# Patient Record
Sex: Male | Born: 1950 | Race: White | Hispanic: No | Marital: Married | Smoking: Former smoker
Health system: Southern US, Community
[De-identification: ages and names within clinical notes are randomized; demographics above are authoritative.]

## PROBLEM LIST (undated history)

## (undated) DIAGNOSIS — I10 Essential (primary) hypertension: Secondary | ICD-10-CM

## (undated) HISTORY — PX: APPENDECTOMY: SHX54

---

## 2010-05-31 DIAGNOSIS — M1990 Unspecified osteoarthritis, unspecified site: Secondary | ICD-10-CM | POA: Insufficient documentation

## 2013-04-03 ENCOUNTER — Other Ambulatory Visit: Payer: Self-pay

## 2013-04-03 ENCOUNTER — Encounter (HOSPITAL_COMMUNITY): Payer: Self-pay | Admitting: Emergency Medicine

## 2013-04-03 ENCOUNTER — Emergency Department (HOSPITAL_COMMUNITY)
Admission: EM | Admit: 2013-04-03 | Discharge: 2013-04-03 | Disposition: A | Discharge status: Home / Self Care | Payer: Non-veteran care | Attending: Emergency Medicine | Admitting: Emergency Medicine

## 2013-04-03 DIAGNOSIS — R319 Hematuria, unspecified: Secondary | ICD-10-CM

## 2013-04-03 DIAGNOSIS — R1013 Epigastric pain: Secondary | ICD-10-CM

## 2013-04-03 DIAGNOSIS — R110 Nausea: Secondary | ICD-10-CM | POA: Insufficient documentation

## 2013-04-03 DIAGNOSIS — I10 Essential (primary) hypertension: Secondary | ICD-10-CM | POA: Insufficient documentation

## 2013-04-03 HISTORY — DX: Essential (primary) hypertension: I10

## 2013-04-03 LAB — URINALYSIS, ROUTINE W REFLEX MICROSCOPIC
Bilirubin Urine: NEGATIVE
Glucose, UA: NEGATIVE mg/dL
Ketones, ur: 40 mg/dL — AB
Leukocytes, UA: NEGATIVE
Protein, ur: NEGATIVE mg/dL
Urobilinogen, UA: 0.2 mg/dL (ref 0.0–1.0)
pH: 6 (ref 5.0–8.0)

## 2013-04-03 LAB — CBC WITH DIFFERENTIAL/PLATELET
Basophils Absolute: 0 10*3/uL (ref 0.0–0.1)
Basophils Relative: 0 % (ref 0–1)
HCT: 46.3 % (ref 39.0–52.0)
Hemoglobin: 16.5 g/dL (ref 13.0–17.0)
Lymphocytes Relative: 26 % (ref 12–46)
Lymphs Abs: 1.9 10*3/uL (ref 0.7–4.0)
MCHC: 35.6 g/dL (ref 30.0–36.0)
Monocytes Absolute: 0.5 10*3/uL (ref 0.1–1.0)
Monocytes Relative: 7 % (ref 3–12)
Neutro Abs: 4.8 10*3/uL (ref 1.7–7.7)
Neutrophils Relative %: 66 % (ref 43–77)
RDW: 12.5 % (ref 11.5–15.5)
WBC: 7.3 10*3/uL (ref 4.0–10.5)

## 2013-04-03 LAB — URINE MICROSCOPIC-ADD ON

## 2013-04-03 LAB — COMPREHENSIVE METABOLIC PANEL
AST: 24 U/L (ref 0–37)
Albumin: 4.5 g/dL (ref 3.5–5.2)
Alkaline Phosphatase: 85 U/L (ref 39–117)
CO2: 25 mEq/L (ref 19–32)
Chloride: 101 mEq/L (ref 96–112)
Creatinine, Ser: 0.82 mg/dL (ref 0.50–1.35)
GFR calc Af Amer: >90 mL/min (ref >90)
GFR calc non Af Amer: >90 mL/min (ref >90)
Glucose, Bld: 112 mg/dL — ABNORMAL HIGH (ref 70–99)
Potassium: 3.6 mEq/L (ref 3.5–5.1)
Total Bilirubin: 0.8 mg/dL (ref 0.3–1.2)

## 2013-04-03 LAB — LIPASE, BLOOD: Lipase: 22 U/L (ref 11–59)

## 2013-04-03 MED ORDER — FAMOTIDINE 20 MG PO TABS: 20.0000 mg | ORAL_TABLET | Freq: Two times a day (BID) | ORAL | Status: DC

## 2013-04-03 MED ORDER — ALPRAZOLAM 0.25 MG PO TABS: 0.2500 mg | ORAL_TABLET | Freq: Every evening | ORAL | Status: DC | PRN

## 2013-04-03 NOTE — ED Provider Notes (Signed)
CSN: [id number]     Arrival date & time 04/03/13  7829 History   First MD Initiated Contact with Patient 04/03/13 1046     Chief Complaint  Patient presents with  . Abdominal Pain   (Consider location/radiation/quality/duration/timing/severity/associated sxs/prior Treatment) Patient is a 62 y.o. male presenting with abdominal pain. The history is provided by the patient.  Abdominal Pain Pain location:  Epigastric Pain quality: aching and dull   Pain radiates to:  Does not radiate Pain severity:  Mild Onset quality:  Gradual Duration:  3 weeks Timing:  Intermittent Progression:  Unchanged Chronicity:  New Relieved by:  Nothing Worsened by:  Nothing tried Ineffective treatments:  None tried Associated symptoms: nausea (off and on, none today)   Associated symptoms: no anorexia, no belching, no chills, no cough, no dysuria, no fever, no shortness of breath and no vomiting    Scott [name] is a 62 y.o. male who presents to the ED with epigastric pain that has been off and on x 3 weeks The pain is not associated with food. He thinks he has gallbladder problems. States he has been to the [place] for his medical care and treated for back problem but has not been for this problem.  He has had a few loose stools but none today. He describes his pain as a dull ache about 3/10.   Past Medical History  Diagnosis Date  . Hypertension    History reviewed. No pertinent past surgical history. No family history on file. History  Substance Use Topics  . Smoking status: Never Smoker   . Smokeless tobacco: Not on file  . Alcohol Use: No    Review of Systems  Constitutional: Negative for fever and chills.  Eyes: Negative for visual disturbance.  Respiratory: Negative for cough and shortness of breath.   Gastrointestinal: Positive for nausea (off and on, none today) and abdominal pain. Negative for vomiting and anorexia.  Genitourinary: Negative for dysuria, urgency, frequency, decreased urine volume  and testicular pain.  Skin: Negative for rash.  Neurological: Negative for weakness and light-headedness. Headaches: no headache today.  Psychiatric/Behavioral: Nervous/anxious: feeling of anxiety.    Results for orders placed during the hospital encounter of 04/03/13 (from the past 24 hour(s))  CBC WITH DIFFERENTIAL     Status: Abnormal   Collection Time    04/03/13 11:08 AM      Result Value Range   WBC 7.3  4.0 - 10.5 K/uL   RBC 4.78  4.22 - 5.81 MIL/uL   Hemoglobin 16.5  13.0 - 17.0 g/dL   HCT 56.2  13.0 - 86.5 %   MCV 96.9  78.0 - 100.0 fL   MCH 34.5 (*) 26.0 - 34.0 pg   MCHC 35.6  30.0 - 36.0 g/dL   RDW 78.4  69.6 - 29.5 %   Platelets 202  150 - 400 K/uL   Neutrophils Relative % 66  43 - 77 %   Neutro Abs 4.8  1.7 - 7.7 K/uL   Lymphocytes Relative 26  12 - 46 %   Lymphs Abs 1.9  0.7 - 4.0 K/uL   Monocytes Relative 7  3 - 12 %   Monocytes Absolute 0.5  0.1 - 1.0 K/uL   Eosinophils Relative 1  0 - 5 %   Eosinophils Absolute 0.1  0.0 - 0.7 K/uL   Basophils Relative 0  0 - 1 %   Basophils Absolute 0.0  0.0 - 0.1 K/uL  COMPREHENSIVE METABOLIC PANEL  Status: Abnormal   Collection Time    04/03/13 11:08 AM      Result Value Range   Sodium 139  135 - 145 mEq/L   Potassium 3.6  3.5 - 5.1 mEq/L   Chloride 101  96 - 112 mEq/L   CO2 25  19 - 32 mEq/L   Glucose, Bld 112 (*) 70 - 99 mg/dL   BUN 15  6 - 23 mg/dL   Creatinine, Ser 1.61  0.50 - 1.35 mg/dL   Calcium 09.6  8.4 - 04.5 mg/dL   Total Protein 7.6  6.0 - 8.3 g/dL   Albumin 4.5  3.5 - 5.2 g/dL   AST 24  0 - 37 U/L   ALT 17  0 - 53 U/L   Alkaline Phosphatase 85  39 - 117 U/L   Total Bilirubin 0.8  0.3 - 1.2 mg/dL   GFR calc non Af Amer >90  >90 mL/min   GFR calc Af Amer >90  >90 mL/min  LIPASE, BLOOD     Status: None   Collection Time    04/03/13 11:08 AM      Result Value Range   Lipase 22  11 - 59 U/L  URINALYSIS, ROUTINE W REFLEX MICROSCOPIC     Status: Abnormal   Collection Time    04/03/13 11:25 AM       Result Value Range   Color, Urine YELLOW  YELLOW   APPearance CLEAR  CLEAR   Specific Gravity, Urine 1.015  1.005 - 1.030   pH 6.0  5.0 - 8.0   Glucose, UA NEGATIVE  NEGATIVE mg/dL   Hgb urine dipstick LARGE (*) NEGATIVE   Bilirubin Urine NEGATIVE  NEGATIVE   Ketones, ur 40 (*) NEGATIVE mg/dL   Protein, ur NEGATIVE  NEGATIVE mg/dL   Urobilinogen, UA 0.2  0.0 - 1.0 mg/dL   Nitrite NEGATIVE  NEGATIVE   Leukocytes, UA NEGATIVE  NEGATIVE  URINE MICROSCOPIC-ADD ON     Status: Abnormal   Collection Time    04/03/13 11:25 AM      Result Value Range   RBC / HPF TOO NUMEROUS TO COUNT  <3 RBC/hpf   Bacteria, UA MANY (*) RARE    Allergies  Review of patient's allergies indicates no known allergies.  Home Medications  No current outpatient prescriptions on file. BP 195/108  Pulse 73  Temp(Src) 98.2 F (36.8 C) (Oral)  Resp 18  Ht 5\' 8"  (1.727 m)  Wt 165 lb (74.844 kg)  BMI 25.09 kg/m2  SpO2 98% Physical Exam  Nursing note and vitals reviewed. Constitutional: He is oriented to person, place, and time. He appears well-developed and well-nourished. No distress.  Elevated BP on arrival to the ED.  HENT:  Head: Normocephalic and atraumatic.  Eyes: EOM are normal.  Neck: Neck supple.  Cardiovascular: Normal rate and regular rhythm.   Pulmonary/Chest: Effort normal and breath sounds normal.  Abdominal: Soft. Bowel sounds are normal. There is no tenderness. There is no CVA tenderness.  Minimal tenderness with deep palpation of the epigastric area.   Musculoskeletal: Normal range of motion.  Neurological: He is alert and oriented to person, place, and time. No cranial nerve deficit.  Skin: Skin is warm and dry.  Psychiatric: He has a normal mood and affect. His behavior is normal.    EKG reviewed by Dr. Estell Harpin and patient may be discharged home.  ED Course  Procedures  Discussed clinical and lab findings with the patient suggested CT of  abdomen/pelvis to further evaluate the  hematuria. Patient states that he is not pre approved by the [place] and it will cost to much. Does not think these symptoms are related. Patient now states that he thinks his epigastric pain is due to being upset over his girlfriend breaking up with him and request something to help him sleep. He thinks he has anxiety that is causing his symptoms. Will treat with Pepcid and give xanax to help with sleep. He is to follow up with his PCP for his hematuria and blood pressure.  MDM  62 y.o. male with known hypertensions and elevated BP, epigastric pain, hematuria. Encouraged patient to follow up with his physician at the [facility] for further evaluation. He states he has had hematuria there in the past and they did not seem concerned about it. He will take the medication we give him for his anxiety and epigastric pain and he will take his BP medication and schedule a follow up with this PCP to discuss his epigastric pain and hematuria. He will return here as needed for problems.      Janne Napoleon, [place] 04/03/13 [phone]

## 2013-04-03 NOTE — ED Notes (Signed)
Pt reports upper abd pain, loose stools, headache,chills and nausea for the past few weeks.  Pt reports hx of "gallbladder problems".  Pt reports being treated by [facility] for it w/out relief.

## 2013-04-03 NOTE — ED Provider Notes (Signed)
Medical screening examination/treatment/procedure(s) were performed by non-physician practitioner and as supervising physician I was immediately available for consultation/collaboration.   Benny Lennert, MD 04/03/13 [phone]

## 2013-06-18 ENCOUNTER — Other Ambulatory Visit (HOSPITAL_COMMUNITY): Payer: Self-pay | Admitting: Family Medicine

## 2013-06-18 DIAGNOSIS — R109 Unspecified abdominal pain: Secondary | ICD-10-CM

## 2013-06-19 ENCOUNTER — Ambulatory Visit (HOSPITAL_COMMUNITY): Payer: BC Managed Care – PPO

## 2013-09-13 ENCOUNTER — Other Ambulatory Visit (HOSPITAL_COMMUNITY): Payer: Self-pay | Admitting: Physician Assistant

## 2013-09-13 DIAGNOSIS — R109 Unspecified abdominal pain: Secondary | ICD-10-CM

## 2013-09-13 DIAGNOSIS — K2960 Other gastritis without bleeding: Secondary | ICD-10-CM

## 2013-09-18 ENCOUNTER — Ambulatory Visit (HOSPITAL_COMMUNITY): Payer: BC Managed Care – PPO

## 2014-11-16 ENCOUNTER — Encounter (HOSPITAL_COMMUNITY): Payer: Self-pay | Admitting: *Deleted

## 2014-11-16 ENCOUNTER — Emergency Department (HOSPITAL_COMMUNITY)
Admission: EM | Admit: 2014-11-16 | Discharge: 2014-11-16 | Disposition: A | Discharge status: Home / Self Care | Payer: Medicare Other | Attending: Emergency Medicine | Admitting: Emergency Medicine

## 2014-11-16 DIAGNOSIS — S61219A Laceration without foreign body of unspecified finger without damage to nail, initial encounter: Secondary | ICD-10-CM

## 2014-11-16 DIAGNOSIS — Y9389 Activity, other specified: Secondary | ICD-10-CM | POA: Insufficient documentation

## 2014-11-16 DIAGNOSIS — Z23 Encounter for immunization: Secondary | ICD-10-CM | POA: Insufficient documentation

## 2014-11-16 DIAGNOSIS — I10 Essential (primary) hypertension: Secondary | ICD-10-CM | POA: Insufficient documentation

## 2014-11-16 DIAGNOSIS — Y998 Other external cause status: Secondary | ICD-10-CM | POA: Diagnosis not present

## 2014-11-16 DIAGNOSIS — Y9289 Other specified places as the place of occurrence of the external cause: Secondary | ICD-10-CM | POA: Insufficient documentation

## 2014-11-16 DIAGNOSIS — S61217A Laceration without foreign body of left little finger without damage to nail, initial encounter: Secondary | ICD-10-CM | POA: Insufficient documentation

## 2014-11-16 DIAGNOSIS — Y289XXA Contact with unspecified sharp object, undetermined intent, initial encounter: Secondary | ICD-10-CM | POA: Insufficient documentation

## 2014-11-16 DIAGNOSIS — Z79899 Other long term (current) drug therapy: Secondary | ICD-10-CM | POA: Insufficient documentation

## 2014-11-16 MED ORDER — CEPHALEXIN 500 MG PO CAPS: 500.0000 mg | ORAL_CAPSULE | Freq: Four times a day (QID) | ORAL | Status: DC

## 2014-11-16 MED ORDER — BACITRACIN ZINC 500 UNIT/GM EX OINT
TOPICAL_OINTMENT | CUTANEOUS | Status: AC
  Filled 2014-11-16: qty 0.9

## 2014-11-16 MED ORDER — POVIDONE-IODINE 10 % EX SOLN
CUTANEOUS | Status: AC
  Administered 2014-11-16: 17:00:00
  Filled 2014-11-16: qty 118

## 2014-11-16 MED ORDER — TETANUS-DIPHTH-ACELL PERTUSSIS 5-2.5-18.5 LF-MCG/0.5 IM SUSP
0.5000 mL | Freq: Once | INTRAMUSCULAR | Status: AC
  Administered 2014-11-16: 0.5 mL via INTRAMUSCULAR
  Filled 2014-11-16: qty 0.5

## 2014-11-16 MED ORDER — BACITRACIN-NEOMYCIN-POLYMYXIN 400-5-5000 EX OINT
TOPICAL_OINTMENT | Freq: Once | CUTANEOUS | Status: AC
  Administered 2014-11-16: 17:00:00 via TOPICAL
  Filled 2014-11-16: qty 1

## 2014-11-16 NOTE — Discharge Instructions (Signed)
Change your dressing daily, wash the wound with warm water and antibacterial soap. Take the antibiotic to prevent infection. Return for worsening symptoms such as fever, increased pain, red streaking, drainage from the wound.

## 2014-11-16 NOTE — ED Provider Notes (Signed)
CSN: [id number]     Arrival date & time 11/16/14  1357 History   First MD Initiated Contact with Patient 11/16/14 1607     Chief Complaint  Patient presents with  . Laceration     (Consider location/radiation/quality/duration/timing/severity/associated sxs/prior Treatment) Patient is a 64 y.o. male presenting with skin laceration. The history is provided by the patient.  Laceration Location:  Finger Finger laceration location:  L little finger Depth:  Through underlying tissue Quality: straight   Time since incident: just prior to arrival to the ED. Laceration mechanism:  Unable to specify  Scott [name] is a 64 y.o. male who presents to the ED with left little finger laceration. He states he was working and put his hand in a pile of plywood and felt pain. He pulled his hand out and there was bleeding to the little finger. He denies any other injuries . He is not up to date on tetanus.   Past Medical History  Diagnosis Date  . Hypertension    History reviewed. No pertinent past surgical history. History reviewed. No pertinent family history. History  Substance Use Topics  . Smoking status: Never Smoker   . Smokeless tobacco: Not on file  . Alcohol Use: No    Review of Systems Negative except as stated in HPI   Allergies  Review of patient's allergies indicates no known allergies.  Home Medications   Prior to Admission medications   Medication Sig Start Date End Date Taking? Authorizing Provider  ALPRAZolam (XANAX) 0.25 MG tablet Take 1 tablet (0.25 mg total) by mouth at bedtime as needed for sleep. 04/03/13   Scott Bunnie Pion, NP  cephALEXin (KEFLEX) 500 MG capsule Take 1 capsule (500 mg total) by mouth 4 (four) times daily. 11/16/14   Scott Bunnie Pion, NP  famotidine (PEPCID) 20 MG tablet Take 1 tablet (20 mg total) by mouth 2 (two) times daily. 04/03/13   Scott Bunnie Pion, NP  PRESCRIPTION MEDICATION Take 0.5 tablets by mouth every 4 (four) hours as needed (pain). Prescription pain  pill. **patient unsure of name**    Historical Provider, MD   BP 168/109 mmHg  Pulse 81  Temp(Src) 98 F (36.7 C) (Oral) Physical Exam  Constitutional: He is oriented to person, place, and time. He appears well-developed and well-nourished. No distress.  HENT:  Head: Normocephalic.  Eyes: EOM are normal.  Neck: Neck supple.  Cardiovascular: Normal rate and regular rhythm.   Pulmonary/Chest: Effort normal.  Abdominal: Soft. There is no tenderness.  Musculoskeletal:       Left hand: He exhibits tenderness and laceration. He exhibits normal range of motion. Normal sensation noted. Normal strength noted.       Hands: There is an avulsion laceration to the tip of the left little finger. Tender on exam, no focal neuro deficits of the left hand noted.   Neurological: He is alert and oriented to person, place, and time. No cranial nerve deficit.  Skin: Skin is warm and dry.  Psychiatric: He has a normal mood and affect. His behavior is normal.  Nursing note and vitals reviewed.   ED Course  Procedures  Soaked the finger in NSS and Betadine, bacitracin ointment and dressing applied, finger splint.   MDM  64 y.o. male with avulsion laceration of the left little finger. Stable for d/c without focal neuro deficits of the left hand. Will start Keflex since this is a dirty wound. Patient to return for any signs of infection or problems. Discussed with the  patient plan of care and all questioned fully answered.    Final diagnoses:  Laceration of finger, left, initial encounter      [facility], NP 11/16/14 1711  Virgel Manifold, MD 11/19/14 [phone]

## 2014-11-16 NOTE — ED Notes (Signed)
Cut L 5th finger w/unknown object while working around home.

## 2016-12-11 DIAGNOSIS — R69 Illness, unspecified: Secondary | ICD-10-CM | POA: Diagnosis not present

## 2016-12-11 DIAGNOSIS — Z6827 Body mass index (BMI) 27.0-27.9, adult: Secondary | ICD-10-CM | POA: Diagnosis not present

## 2016-12-11 DIAGNOSIS — Z0000 Encounter for general adult medical examination without abnormal findings: Secondary | ICD-10-CM | POA: Diagnosis not present

## 2016-12-11 DIAGNOSIS — M25561 Pain in right knee: Secondary | ICD-10-CM | POA: Diagnosis not present

## 2016-12-11 DIAGNOSIS — R030 Elevated blood-pressure reading, without diagnosis of hypertension: Secondary | ICD-10-CM | POA: Diagnosis not present

## 2019-06-21 ENCOUNTER — Other Ambulatory Visit: Payer: Self-pay

## 2019-06-21 ENCOUNTER — Ambulatory Visit: Payer: Medicare Other | Attending: Internal Medicine

## 2019-06-21 DIAGNOSIS — Z20822 Contact with and (suspected) exposure to covid-19: Secondary | ICD-10-CM

## 2019-06-23 LAB — NOVEL CORONAVIRUS, NAA: SARS-CoV-2, NAA: DETECTED — AB

## 2019-06-25 ENCOUNTER — Telehealth: Payer: Self-pay | Admitting: Nurse Practitioner

## 2019-06-25 NOTE — Telephone Encounter (Signed)
Called to Discuss with patient about Covid symptoms and the use of bamlanivimab, a monoclonal antibody infusion for those with mild to moderate Covid symptoms and at a high risk of hospitalization.     Pt is qualified for this infusion at the [facility] infusion center due to co-morbid conditions and/or a member of an at-risk group.      Patient will be more than 10 days out on symptoms before he can be scheduled for infusion.

## 2019-08-15 DIAGNOSIS — Z85828 Personal history of other malignant neoplasm of skin: Secondary | ICD-10-CM | POA: Diagnosis not present

## 2019-08-15 DIAGNOSIS — R030 Elevated blood-pressure reading, without diagnosis of hypertension: Secondary | ICD-10-CM | POA: Diagnosis not present

## 2019-08-15 DIAGNOSIS — Z809 Family history of malignant neoplasm, unspecified: Secondary | ICD-10-CM | POA: Diagnosis not present

## 2019-08-15 DIAGNOSIS — Z87891 Personal history of nicotine dependence: Secondary | ICD-10-CM | POA: Diagnosis not present

## 2019-08-15 DIAGNOSIS — M1990 Unspecified osteoarthritis, unspecified site: Secondary | ICD-10-CM | POA: Diagnosis not present

## 2020-01-29 ENCOUNTER — Inpatient Hospital Stay (HOSPITAL_COMMUNITY): Payer: No Typology Code available for payment source

## 2020-01-29 ENCOUNTER — Emergency Department (HOSPITAL_COMMUNITY): Payer: No Typology Code available for payment source

## 2020-01-29 ENCOUNTER — Encounter (HOSPITAL_COMMUNITY): Payer: Self-pay | Admitting: Emergency Medicine

## 2020-01-29 ENCOUNTER — Other Ambulatory Visit: Payer: Self-pay

## 2020-01-29 ENCOUNTER — Inpatient Hospital Stay (HOSPITAL_COMMUNITY)
Admission: EM | Admit: 2020-01-29 | Discharge: 2020-02-06 | DRG: 220 | Disposition: A | Discharge status: Home / Self Care | Payer: No Typology Code available for payment source | Attending: Internal Medicine | Admitting: Internal Medicine

## 2020-01-29 DIAGNOSIS — F40240 Claustrophobia: Secondary | ICD-10-CM | POA: Diagnosis not present

## 2020-01-29 DIAGNOSIS — I7103 Dissection of thoracoabdominal aorta: Secondary | ICD-10-CM | POA: Diagnosis not present

## 2020-01-29 DIAGNOSIS — I7101 Dissection of thoracic aorta: Secondary | ICD-10-CM | POA: Diagnosis not present

## 2020-01-29 DIAGNOSIS — I7772 Dissection of iliac artery: Secondary | ICD-10-CM | POA: Diagnosis not present

## 2020-01-29 DIAGNOSIS — Z20822 Contact with and (suspected) exposure to covid-19: Secondary | ICD-10-CM | POA: Diagnosis not present

## 2020-01-29 DIAGNOSIS — I493 Ventricular premature depolarization: Secondary | ICD-10-CM | POA: Diagnosis present

## 2020-01-29 DIAGNOSIS — I714 Abdominal aortic aneurysm, without rupture, unspecified: Secondary | ICD-10-CM | POA: Diagnosis present

## 2020-01-29 DIAGNOSIS — I71019 Dissection of thoracic aorta, unspecified: Secondary | ICD-10-CM

## 2020-01-29 DIAGNOSIS — Z79899 Other long term (current) drug therapy: Secondary | ICD-10-CM

## 2020-01-29 DIAGNOSIS — I10 Essential (primary) hypertension: Secondary | ICD-10-CM | POA: Diagnosis present

## 2020-01-29 DIAGNOSIS — I351 Nonrheumatic aortic (valve) insufficiency: Secondary | ICD-10-CM | POA: Diagnosis present

## 2020-01-29 DIAGNOSIS — N281 Cyst of kidney, acquired: Secondary | ICD-10-CM | POA: Diagnosis not present

## 2020-01-29 DIAGNOSIS — I350 Nonrheumatic aortic (valve) stenosis: Secondary | ICD-10-CM

## 2020-01-29 DIAGNOSIS — I70203 Unspecified atherosclerosis of native arteries of extremities, bilateral legs: Secondary | ICD-10-CM | POA: Diagnosis not present

## 2020-01-29 DIAGNOSIS — R079 Chest pain, unspecified: Secondary | ICD-10-CM | POA: Diagnosis not present

## 2020-01-29 DIAGNOSIS — E876 Hypokalemia: Secondary | ICD-10-CM | POA: Diagnosis not present

## 2020-01-29 DIAGNOSIS — I2510 Atherosclerotic heart disease of native coronary artery without angina pectoris: Secondary | ICD-10-CM | POA: Diagnosis not present

## 2020-01-29 DIAGNOSIS — I161 Hypertensive emergency: Secondary | ICD-10-CM | POA: Diagnosis not present

## 2020-01-29 DIAGNOSIS — K5900 Constipation, unspecified: Secondary | ICD-10-CM | POA: Diagnosis not present

## 2020-01-29 DIAGNOSIS — M4804 Spinal stenosis, thoracic region: Secondary | ICD-10-CM | POA: Diagnosis not present

## 2020-01-29 DIAGNOSIS — R739 Hyperglycemia, unspecified: Secondary | ICD-10-CM | POA: Diagnosis present

## 2020-01-29 DIAGNOSIS — G9589 Other specified diseases of spinal cord: Secondary | ICD-10-CM | POA: Diagnosis not present

## 2020-01-29 DIAGNOSIS — D649 Anemia, unspecified: Secondary | ICD-10-CM | POA: Diagnosis not present

## 2020-01-29 DIAGNOSIS — G9511 Acute infarction of spinal cord (embolic) (nonembolic): Secondary | ICD-10-CM | POA: Diagnosis not present

## 2020-01-29 DIAGNOSIS — Z9114 Patient's other noncompliance with medication regimen: Secondary | ICD-10-CM

## 2020-01-29 DIAGNOSIS — Z9889 Other specified postprocedural states: Secondary | ICD-10-CM

## 2020-01-29 DIAGNOSIS — M545 Low back pain: Secondary | ICD-10-CM | POA: Diagnosis not present

## 2020-01-29 DIAGNOSIS — I7102 Dissection of abdominal aorta: Secondary | ICD-10-CM | POA: Diagnosis present

## 2020-01-29 DIAGNOSIS — J90 Pleural effusion, not elsewhere classified: Secondary | ICD-10-CM | POA: Diagnosis not present

## 2020-01-29 DIAGNOSIS — I712 Thoracic aortic aneurysm, without rupture: Secondary | ICD-10-CM | POA: Diagnosis not present

## 2020-01-29 DIAGNOSIS — D72829 Elevated white blood cell count, unspecified: Secondary | ICD-10-CM | POA: Diagnosis not present

## 2020-01-29 DIAGNOSIS — K7689 Other specified diseases of liver: Secondary | ICD-10-CM | POA: Diagnosis not present

## 2020-01-29 DIAGNOSIS — M5124 Other intervertebral disc displacement, thoracic region: Secondary | ICD-10-CM | POA: Diagnosis not present

## 2020-01-29 DIAGNOSIS — M5134 Other intervertebral disc degeneration, thoracic region: Secondary | ICD-10-CM | POA: Diagnosis not present

## 2020-01-29 DIAGNOSIS — I700 Atherosclerosis of aorta: Secondary | ICD-10-CM | POA: Diagnosis not present

## 2020-01-29 DIAGNOSIS — M546 Pain in thoracic spine: Secondary | ICD-10-CM | POA: Diagnosis present

## 2020-01-29 DIAGNOSIS — I517 Cardiomegaly: Secondary | ICD-10-CM | POA: Diagnosis not present

## 2020-01-29 LAB — TYPE AND SCREEN
ABO/RH(D): O POS
Antibody Screen: NEGATIVE

## 2020-01-29 LAB — CBC
HCT: 45.7 % (ref 39.0–52.0)
Hemoglobin: 15.4 g/dL (ref 13.0–17.0)
MCH: 33.6 pg (ref 26.0–34.0)
MCHC: 33.7 g/dL (ref 30.0–36.0)
MCV: 99.6 fL (ref 80.0–100.0)
Platelets: 206 10*3/uL (ref 150–400)
RBC: 4.59 MIL/uL (ref 4.22–5.81)
RDW: 13.2 % (ref 11.5–15.5)
WBC: 8.7 10*3/uL (ref 4.0–10.5)
nRBC: 0 % (ref 0.0–0.2)

## 2020-01-29 LAB — URINALYSIS, ROUTINE W REFLEX MICROSCOPIC
Bacteria, UA: NONE SEEN
Bilirubin Urine: NEGATIVE
Glucose, UA: NEGATIVE mg/dL
Ketones, ur: NEGATIVE mg/dL
Leukocytes,Ua: NEGATIVE
Nitrite: NEGATIVE
Protein, ur: NEGATIVE mg/dL
Specific Gravity, Urine: 1.003 — ABNORMAL LOW (ref 1.005–1.030)
pH: 7 (ref 5.0–8.0)

## 2020-01-29 LAB — TROPONIN I (HIGH SENSITIVITY)
Troponin I (High Sensitivity): 31 ng/L — ABNORMAL HIGH (ref <18)
Troponin I (High Sensitivity): 49 ng/L — ABNORMAL HIGH

## 2020-01-29 LAB — BASIC METABOLIC PANEL WITH GFR
Anion gap: 15 (ref 5–15)
BUN: 22 mg/dL (ref 8–23)
CO2: 20 mmol/L — ABNORMAL LOW (ref 22–32)
Calcium: 8.9 mg/dL (ref 8.9–10.3)
Chloride: 105 mmol/L (ref 98–111)
Creatinine, Ser: 1.05 mg/dL (ref 0.61–1.24)
GFR calc Af Amer: >60 mL/min
GFR calc non Af Amer: >60 mL/min
Glucose, Bld: 138 mg/dL — ABNORMAL HIGH (ref 70–99)
Potassium: 3.1 mmol/L — ABNORMAL LOW (ref 3.5–5.1)
Sodium: 140 mmol/L (ref 135–145)

## 2020-01-29 LAB — ECHOCARDIOGRAM COMPLETE
AR max vel: 2.83 cm2
AV Area VTI: 2.88 cm2
AV Area mean vel: 2.88 cm2
AV Mean grad: 7 mmHg
AV Peak grad: 15.2 mmHg
Ao pk vel: 1.95 m/s
Area-P 1/2: 2.3 cm2
Height: 68.5 in
S' Lateral: 3.5 cm

## 2020-01-29 LAB — CBG MONITORING, ED: Glucose-Capillary: 143 mg/dL — ABNORMAL HIGH (ref 70–99)

## 2020-01-29 LAB — ABO/RH: ABO/RH(D): O POS

## 2020-01-29 LAB — PROTIME-INR
INR: 1.1 (ref 0.8–1.2)
Prothrombin Time: 14.2 seconds (ref 11.4–15.2)

## 2020-01-29 LAB — SARS CORONAVIRUS 2 BY RT PCR (HOSPITAL ORDER, PERFORMED IN ~~LOC~~ HOSPITAL LAB): SARS Coronavirus 2: NEGATIVE

## 2020-01-29 LAB — MRSA PCR SCREENING: MRSA by PCR: NEGATIVE

## 2020-01-29 MED ORDER — ASPIRIN 81 MG PO CHEW: 324.0000 mg | CHEWABLE_TABLET | Freq: Once | ORAL | Status: DC

## 2020-01-29 MED ORDER — SODIUM CHLORIDE 0.9% FLUSH
3.0000 mL | INTRAVENOUS | Status: DC | PRN
  Administered 2020-02-05: 3 mL via INTRAVENOUS

## 2020-01-29 MED ORDER — CHLORHEXIDINE GLUCONATE CLOTH 2 % EX PADS
6.0000 | MEDICATED_PAD | Freq: Every day | CUTANEOUS | Status: DC
  Administered 2020-01-31 – 2020-02-04 (×5): 6 via TOPICAL

## 2020-01-29 MED ORDER — LABETALOL HCL 5 MG/ML IV SOLN
10.0000 mg | INTRAVENOUS | Status: DC | PRN
  Filled 2020-01-29: qty 4

## 2020-01-29 MED ORDER — NITROGLYCERIN IN D5W 200-5 MCG/ML-% IV SOLN
0.0000 ug/min | INTRAVENOUS | Status: DC
  Administered 2020-01-29: 20 ug/min via INTRAVENOUS
  Administered 2020-01-30: 100 ug/min via INTRAVENOUS
  Administered 2020-01-30: 60 ug/min via INTRAVENOUS
  Administered 2020-01-31: 40 ug/min via INTRAVENOUS
  Filled 2020-01-29 (×3): qty 250

## 2020-01-29 MED ORDER — MORPHINE SULFATE (PF) 4 MG/ML IV SOLN
4.0000 mg | Freq: Once | INTRAVENOUS | Status: AC
  Administered 2020-01-29: 4 mg via INTRAVENOUS
  Filled 2020-01-29: qty 1

## 2020-01-29 MED ORDER — FENTANYL CITRATE (PF) 100 MCG/2ML IJ SOLN
25.0000 ug | INTRAMUSCULAR | Status: DC | PRN
  Administered 2020-01-29 (×2): 50 ug via INTRAVENOUS
  Administered 2020-01-29: 25 ug via INTRAVENOUS
  Administered 2020-01-29 – 2020-02-02 (×7): 50 ug via INTRAVENOUS
  Administered 2020-02-02: 25 ug via INTRAVENOUS
  Administered 2020-02-02 – 2020-02-04 (×10): 50 ug via INTRAVENOUS
  Filled 2020-01-29 (×23): qty 2

## 2020-01-29 MED ORDER — MIDAZOLAM HCL 2 MG/2ML IJ SOLN
1.0000 mg | Freq: Once | INTRAMUSCULAR | Status: DC
  Filled 2020-01-29: qty 2

## 2020-01-29 MED ORDER — LABETALOL HCL 5 MG/ML IV SOLN
10.0000 mg | INTRAVENOUS | Status: AC | PRN
  Administered 2020-01-29 – 2020-01-30 (×6): 20 mg via INTRAVENOUS
  Filled 2020-01-29 (×4): qty 4

## 2020-01-29 MED ORDER — ACETAMINOPHEN 325 MG PO TABS
650.0000 mg | ORAL_TABLET | ORAL | Status: DC | PRN
  Administered 2020-01-30 – 2020-02-06 (×15): 650 mg via ORAL
  Filled 2020-01-29 (×15): qty 2

## 2020-01-29 MED ORDER — SODIUM CHLORIDE 0.9 % IV SOLN: 250.0000 mL | INTRAVENOUS | Status: DC | PRN

## 2020-01-29 MED ORDER — CLEVIDIPINE BUTYRATE 0.5 MG/ML IV EMUL
0.0000 mg/h | INTRAVENOUS | Status: DC
  Administered 2020-01-29: 21 mg/h via INTRAVENOUS
  Filled 2020-01-29 (×2): qty 100

## 2020-01-29 MED ORDER — POLYETHYLENE GLYCOL 3350 17 G PO PACK
17.0000 g | PACK | Freq: Every day | ORAL | Status: DC | PRN
  Administered 2020-01-30: 17 g via ORAL
  Filled 2020-01-29: qty 1

## 2020-01-29 MED ORDER — FAMOTIDINE 20 MG PO TABS
20.0000 mg | ORAL_TABLET | Freq: Two times a day (BID) | ORAL | Status: DC
  Administered 2020-01-29 – 2020-02-06 (×15): 20 mg via ORAL
  Filled 2020-01-29 (×16): qty 1

## 2020-01-29 MED ORDER — CLEVIDIPINE BUTYRATE 0.5 MG/ML IV EMUL
0.0000 mg/h | INTRAVENOUS | Status: AC
  Administered 2020-01-29: 1 mg/h via INTRAVENOUS
  Administered 2020-01-29: 21 mg/h via INTRAVENOUS
  Filled 2020-01-29 (×3): qty 50

## 2020-01-29 MED ORDER — SODIUM CHLORIDE 0.9% FLUSH
3.0000 mL | Freq: Two times a day (BID) | INTRAVENOUS | Status: DC
  Administered 2020-01-29 – 2020-01-30 (×4): 3 mL via INTRAVENOUS
  Administered 2020-01-31: 10 mL via INTRAVENOUS
  Administered 2020-01-31: 3 mL via INTRAVENOUS
  Administered 2020-02-01: 10 mL via INTRAVENOUS
  Administered 2020-02-01 – 2020-02-05 (×7): 3 mL via INTRAVENOUS

## 2020-01-29 MED ORDER — POTASSIUM CHLORIDE CRYS ER 20 MEQ PO TBCR
40.0000 meq | EXTENDED_RELEASE_TABLET | ORAL | Status: AC
  Administered 2020-01-29 (×2): 40 meq via ORAL
  Filled 2020-01-29 (×2): qty 2

## 2020-01-29 MED ORDER — ONDANSETRON HCL 4 MG/2ML IJ SOLN: 4.0000 mg | Freq: Four times a day (QID) | INTRAMUSCULAR | Status: DC | PRN

## 2020-01-29 MED ORDER — DOCUSATE SODIUM 100 MG PO CAPS: 100.0000 mg | ORAL_CAPSULE | Freq: Two times a day (BID) | ORAL | Status: DC | PRN

## 2020-01-29 MED ORDER — IOHEXOL 350 MG/ML SOLN
100.0000 mL | Freq: Once | INTRAVENOUS | Status: AC | PRN
  Administered 2020-01-29: 100 mL via INTRAVENOUS

## 2020-01-29 MED ORDER — MORPHINE SULFATE (PF) 4 MG/ML IV SOLN
4.0000 mg | Freq: Once | INTRAVENOUS | Status: AC
  Administered 2020-01-29: 4 mg via INTRAVENOUS
  Filled 2020-01-29 (×2): qty 1

## 2020-01-29 NOTE — ED Provider Notes (Signed)
[place] Provider Note   CSN: [id number] Arrival date & time: 01/29/20  0076     History Chief Complaint  Patient presents with  . Chest Pain    Scott [name] is a 69 y.o. male.  He is complaining of 5 out of 10 mid back pain started around 530 causing him to be weak in the legs.  He said he was able to ambulate in but does not feel like he could walk now.  Reportedly was diaphoretic on arrival.  History of back problems.  Denies any chest pain abdominal pain bowel or bladder incontinence numbness.  No trauma.  No fever.  The history is provided by the patient.  Back Pain Location:  Thoracic spine and lumbar spine Quality:  Aching Pain severity:  Moderate Onset quality:  Gradual Timing:  Constant Progression:  Worsening Chronicity:  New Context: not falling   Relieved by:  None tried Worsened by:  Nothing Ineffective treatments:  None tried Associated symptoms: weakness (bilat legs)   Associated symptoms: no abdominal pain, no bladder incontinence, no bowel incontinence, no chest pain, no dysuria, no fever, no leg pain and no numbness        Past Medical History:  Diagnosis Date  . Hypertension     There are no problems to display for this patient.   No past surgical history on file.     No family history on file.  Social History   Tobacco Use  . Smoking status: Never Smoker  Substance Use Topics  . Alcohol use: No  . Drug use: No    Home Medications Prior to Admission medications   Medication Sig Start Date End Date Taking? Authorizing Provider  ALPRAZolam (XANAX) 0.25 MG tablet Take 1 tablet (0.25 mg total) by mouth at bedtime as needed for sleep. 04/03/13   Ashley Murrain, NP  cephALEXin (KEFLEX) 500 MG capsule Take 1 capsule (500 mg total) by mouth 4 (four) times daily. 11/16/14   Ashley Murrain, NP  famotidine (PEPCID) 20 MG tablet Take 1 tablet (20 mg total) by mouth 2 (two) times daily. 04/03/13   Ashley Murrain, NP  PRESCRIPTION  MEDICATION Take 0.5 tablets by mouth every 4 (four) hours as needed (pain). Prescription pain pill. **patient unsure of name**    [provider]    Allergies    Patient has no known allergies.  Review of Systems   Review of Systems  Constitutional: Positive for diaphoresis. Negative for fever.  HENT: Negative for sore throat.   Eyes: Negative for visual disturbance.  Respiratory: Negative for shortness of breath.   Cardiovascular: Negative for chest pain.  Gastrointestinal: Negative for abdominal pain and bowel incontinence.  Genitourinary: Negative for bladder incontinence and dysuria.  Musculoskeletal: Positive for back pain.  Skin: Negative for rash.  Neurological: Positive for weakness (bilat legs). Negative for numbness.    Physical Exam Updated Vital Signs BP (!) 207/109 (BP Location: Left Arm)   Pulse 74   Temp (!) 97.4 F (36.3 C) (Oral)   Resp 16   Ht 5' 8.5" (1.74 m)   SpO2 100%   BMI 24.72 kg/m   Physical Exam Vitals and nursing note reviewed.  Constitutional:      General: He is not in acute distress.    Appearance: Normal appearance. He is well-developed.  HENT:     Head: Normocephalic and atraumatic.  Eyes:     Conjunctiva/sclera: Conjunctivae normal.  Cardiovascular:     Rate and  Rhythm: Normal rate and regular rhythm.     Pulses: Normal pulses.          Radial pulses are 2+ on the right side and 2+ on the left side.       Popliteal pulses are 2+ on the right side and 2+ on the left side.       Dorsalis pedis pulses are 2+ on the right side and 2+ on the left side.     Heart sounds: Normal heart sounds. No murmur heard.   Pulmonary:     Effort: Pulmonary effort is normal. No respiratory distress.     Breath sounds: Normal breath sounds.  Abdominal:     Palpations: Abdomen is soft. There is no mass.     Tenderness: There is no abdominal tenderness. There is no guarding or rebound.  Musculoskeletal:        General: Normal range of motion.      Cervical back: Neck supple.     Right lower leg: No tenderness. No edema.     Left lower leg: No tenderness. No edema.     Comments: There is no point tenderness cervical lumbar or thoracic spine.  No reproducible tenderness.  Skin:    General: Skin is warm and dry.     Capillary Refill: Capillary refill takes less than 2 seconds.  Neurological:     General: No focal deficit present.     Mental Status: He is alert.     Cranial Nerves: No cranial nerve deficit.     Sensory: No sensory deficit.     Comments: Patient states he feels weak in the legs.  He is got 5 out of 5 plantar flexion and extension.  Left proximal leg difficult to raise off the bed.  Unclear if this is pain related or true weakness.     ED Results / Procedures / Treatments   Labs (all labs ordered are listed, but only abnormal results are displayed) Labs Reviewed  BASIC METABOLIC PANEL - Abnormal; Notable for the following components:      Result Value   Potassium 3.1 (*)    CO2 20 (*)    Glucose, Bld 138 (*)    All other components within normal limits  URINALYSIS, ROUTINE W REFLEX MICROSCOPIC - Abnormal; Notable for the following components:   Color, Urine COLORLESS (*)    Specific Gravity, Urine 1.003 (*)    Hgb urine dipstick MODERATE (*)    All other components within normal limits  CBG MONITORING, ED - Abnormal; Notable for the following components:   Glucose-Capillary 143 (*)    All other components within normal limits  TROPONIN I (HIGH SENSITIVITY) - Abnormal; Notable for the following components:   Troponin I (High Sensitivity) 31 (*)    All other components within normal limits  TROPONIN I (HIGH SENSITIVITY) - Abnormal; Notable for the following components:   Troponin I (High Sensitivity) 49 (*)    All other components within normal limits  SARS CORONAVIRUS 2 BY RT PCR (HOSPITAL ORDER, [facility])  MRSA PCR SCREENING  CBC  PROTIME-INR  CBC  BASIC METABOLIC PANEL    MAGNESIUM  PHOSPHORUS  HEMOGLOBIN A1C  CBG MONITORING, ED  TYPE AND SCREEN  ABO/RH    EKG EKG Interpretation  Date/Time:  Tuesday January 29 2020 06:44:00 EDT Ventricular Rate:  77 PR Interval:    QRS Duration: 105 QT Interval:  396 QTC Calculation: 449 R Axis:   68 Text  Interpretation: Sinus rhythm Multiple premature complexes, vent & supraven Nonspecific repol abnormality, diffuse leads Confirmed by Aletta Edouard [phone]) on 01/29/2020 6:57:28 AM   Radiology DG Chest Portable 1 View  Result Date: 01/29/2020 CLINICAL DATA:  Chest pain EXAM: PORTABLE CHEST 1 VIEW COMPARISON:  None FINDINGS: Lungs are clear. Heart is mildly enlarged with pulmonary vascularity normal. No adenopathy. No pneumothorax. No bone lesions. IMPRESSION: Heart mildly enlarged.  No edema or airspace opacity. Electronically Signed   By: Lowella Grip III M.D.   On: 01/29/2020 07:49   ECHOCARDIOGRAM COMPLETE  Result Date: 01/29/2020    ECHOCARDIOGRAM REPORT   Patient Name:   ELDOR CONAWAY Date of Exam: 01/29/2020 Medical Rec #:  [id number]  Height:       68.5 in Accession #:    [id number] Weight:       165.0 lb Date of Birth:  1950-10-29  BSA:          1.894 m Patient Age:    88 years   BP:           126/65 mmHg Patient Gender: M          HR:           64 bpm. Exam Location:  Inpatient Procedure: 2D Echo Indications:    preoperative evaluation  History:        Patient has no prior history of Echocardiogram examinations.                 COPD; Risk Factors:Hypertension.  Sonographer:    Jannett Celestine RDCS (AE) Referring Phys: [place]  1. Left ventricular ejection fraction, by estimation, is 50 to 55%. The left ventricle has low normal function. The left ventricle demonstrates regional wall motion abnormalities (see scoring diagram/findings for description). There is moderate concentric left ventricular hypertrophy. Left ventricular diastolic parameters are consistent with Grade I diastolic  dysfunction (impaired relaxation). Elevated left atrial pressure. There is moderate hypokinesis of the left ventricular, basal-mid inferolateral wall.  2. Right ventricular systolic function is normal. The right ventricular size is normal. Tricuspid regurgitation signal is inadequate for assessing PA pressure.  3. Left atrial size was moderately dilated.  4. The mitral valve is normal in structure. No evidence of mitral valve regurgitation.  5. The aortic valve is tricuspid. Aortic valve regurgitation is mild to moderate. Mild aortic valve sclerosis is present, with no evidence of aortic valve stenosis. FINDINGS  Left Ventricle: Left ventricular ejection fraction, by estimation, is 50 to 55%. The left ventricle has low normal function. The left ventricle demonstrates regional wall motion abnormalities. Moderate hypokinesis of the left ventricular, basal-mid inferolateral wall. The left ventricular internal cavity size was normal in size. There is moderate concentric left ventricular hypertrophy. Left ventricular diastolic parameters are consistent with Grade I diastolic dysfunction (impaired relaxation). Elevated left atrial pressure. Right Ventricle: The right ventricular size is normal. No increase in right ventricular wall thickness. Right ventricular systolic function is normal. Tricuspid regurgitation signal is inadequate for assessing PA pressure. Left Atrium: Left atrial size was moderately dilated. Right Atrium: Right atrial size was normal in size. Pericardium: There is no evidence of pericardial effusion. Mitral Valve: The mitral valve is normal in structure. Mild mitral annular calcification. No evidence of mitral valve regurgitation. Tricuspid Valve: The tricuspid valve is normal in structure. Tricuspid valve regurgitation is not demonstrated. Aortic Valve: The aortic valve is tricuspid. Aortic valve regurgitation is mild to moderate. Mild aortic valve sclerosis is present, with  no evidence of aortic  valve stenosis. Aortic valve mean gradient measures 7.0 mmHg. Aortic valve peak gradient measures 15.2 mmHg. Aortic valve area, by VTI measures 2.88 cm. Pulmonic Valve: The pulmonic valve was grossly normal. Pulmonic valve regurgitation is not visualized. Aorta: The aortic root and ascending aorta are structurally normal, with no evidence of dilitation. IAS/Shunts: No atrial level shunt detected by color flow Doppler.  LEFT VENTRICLE PLAX 2D LVIDd:         5.60 cm  Diastology LVIDs:         3.50 cm  LV e' lateral:   5.77 cm/s LV PW:         1.50 cm  LV E/e' lateral: 14.5 LV IVS:        1.70 cm  LV e' medial:    4.03 cm/s LVOT diam:     2.20 cm  LV E/e' medial:  20.8 LV SV:         106 LV SV Index:   56 LVOT Area:     3.80 cm  RIGHT VENTRICLE RV S prime:     17.00 cm/s TAPSE (M-mode): 2.4 cm LEFT ATRIUM             Index       RIGHT ATRIUM           Index LA diam:        4.40 cm 2.32 cm/m  RA Area:     17.20 cm LA Vol (A2C):   64.2 ml 33.90 ml/m RA Volume:   47.10 ml  24.87 ml/m LA Vol (A4C):   49.6 ml 26.19 ml/m LA Biplane Vol: 60.1 ml 31.74 ml/m  AORTIC VALVE AV Area (Vmax):    2.83 cm AV Area (Vmean):   2.88 cm AV Area (VTI):     2.88 cm AV Vmax:           195.00 cm/s AV Vmean:          125.000 cm/s AV VTI:            0.367 m AV Peak Grad:      15.2 mmHg AV Mean Grad:      7.0 mmHg LVOT Vmax:         145.00 cm/s LVOT Vmean:        94.600 cm/s LVOT VTI:          0.278 m LVOT/AV VTI ratio: 0.76  AORTA Ao Root diam: 4.30 cm MITRAL VALVE MV Area (PHT): 2.30 cm     SHUNTS MV Decel Time: 330 msec     Systemic VTI:  0.28 m MV E velocity: 83.70 cm/s   Systemic Diam: 2.20 cm MV A velocity: 131.00 cm/s MV E/A ratio:  0.64 Mihai Croitoru MD Electronically signed by Sanda Klein MD Signature Date/Time: 01/29/2020/2:04:24 PM    Final    CT Angio Chest/Abd/Pel for Dissection W and/or W/WO  Result Date: 01/29/2020 CLINICAL DATA:  69 year old male with a history of chest pain EXAM: CT ANGIOGRAPHY CHEST, ABDOMEN  AND PELVIS TECHNIQUE: Multidetector CT imaging through the chest, abdomen and pelvis was performed using the standard protocol during bolus administration of intravenous contrast. Multiplanar reconstructed images and MIPs were obtained and reviewed to evaluate the vascular anatomy. CONTRAST:  127mL OMNIPAQUE IOHEXOL 350 MG/ML SOLN COMPARISON:  None. FINDINGS: CTA CHEST FINDINGS Cardiovascular: Heart: Borderline enlarged heart. No pericardial fluid/thickening. Calcifications of the left anterior descending coronary artery. Aorta: No significant aortic valve calcifications. Diameter of the ascending aorta measures  4.2 cm. Three vessel arch without significant atherosclerotic changes of the branch vessels. Proximal common carotid arteries are patent. Right vertebral artery is patent. The origin of the left vertebral artery is best seen on the coronal images, beyond the inflection point of the left subclavian artery at the inlet. Artery is patent at the origin with some redundancy at the base of the neck. Intramural hematoma is present within the distal aortic arch extending from the left subclavian artery origin through the aortic arch and distal thoracic aorta below the diaphragm. The intramural hematoma is manifested on the noncontrast CT as a hyperdense crescent sign. Thickness is estimated approximately 10 mm. Diameter of the zone 3 aorta is estimated 40 mm on image 34. Diameter of zone 4 is estimated 39 mm on image 39 of series 4. Contrast focus within the intramural hematoma in the zone 3 aorta on image 29 of series 4 compatible with penetrating ulcer in a region of calcified plaque. Diameter of the aorta at the hiatus measures 3.1 cm. Pulmonary arteries: Timing of the contrast bolus is not optimized for the pulmonary arteries. However common no central filling defects present within the proximal pulmonary arteries. Mediastinum/Nodes: No mediastinal hematoma. Small lymph nodes are present. Unremarkable thoracic  inlet. Unremarkable appearance of the thoracic esophagus. Lungs/Pleura: Central airways are clear. No pleural effusion. No confluent airspace disease. No pneumothorax. CTA ABDOMEN AND PELVIS FINDINGS VASCULAR Aorta: The intramural hematoma extends from the distal thoracic aorta through the aortic hiatus, and appears to terminate just above the right renal artery in the suprarenal aorta. Diameter at the renal arteries is estimated 2.5 cm. Diameter just above the bifurcation is estimated 2.2 cm. Mild atherosclerosis of the abdominal aorta. No periaortic fluid or inflammatory changes. Celiac: Celiac artery patent without significant atherosclerosis. SMA: SMA patent without significant atherosclerosis. Renals: - Right: Main right renal artery which is patent and mild atherosclerosis. There is a very small accessory right renal artery to the lower pole segment, of which the origin is not identified (with the artery visualized on image 116 of series 4). - Left: The main left renal artery is patent without significant atherosclerosis. There is accessory left renal artery from the 4 o'clock position, just below the main left renal artery. IMA: Inferior mesenteric artery is patent. Right lower extremity: Unremarkable caliber, and contour of the right iliac system. No aneurysm, dissection, or occlusion. Mild atherosclerosis with mild tortuosity. Hypogastric artery is patent. Common femoral artery patent, with high bifurcation. Proximal SFA and profunda femoris patent. Left lower extremity: Unremarkable caliber, and contour of the left iliac system. No aneurysm, dissection, or occlusion. Mild atherosclerosis of the left iliac system with mild tortuosity. Hypogastric artery is patent. Common femoral artery patent. Proximal SFA and profunda femoris patent. Veins: Unremarkable appearance of the venous system. Review of the MIP images confirms the above findings. NON-VASCULAR Hepatobiliary: Low-density/cystic structure within the  superior left liver, segment 4 a, 3.0 cm and most likely a benign cyst. No comparison available. Otherwise unremarkable liver. Unremarkable gall bladder. Pancreas: Unremarkable. Spleen: Unremarkable. Adrenals/Urinary Tract: - Right adrenal gland: Unremarkable - Left adrenal gland: Unremarkable. - Right kidney: No hydronephrosis, nephrolithiasis, inflammation, or ureteral dilation. Low-density lesion on the lateral cortex of the right kidney, most likely benign Bosniak 1 cyst. - Left Kidney: No hydronephrosis, nephrolithiasis, inflammation, or ureteral dilation. No focal lesion. - Urinary Bladder: Unremarkable. Stomach/Bowel: - Stomach: Unremarkable. - Small bowel: Unremarkable - Appendix: Appendix is not visualized, however, no inflammatory changes are present adjacent to the  cecum to indicate an appendicitis. - Colon: Colonic diverticular disease. No focal inflammatory changes. Lymphatic: No adenopathy. Mesenteric: No free fluid or air. No mesenteric adenopathy. Reproductive: Transverse diameter of the prostate measures 41 mm. Other: The right aspect of the lower urinary bladder encroaches upon a fat containing right inguinal hernia. Musculoskeletal: Degenerative changes of the lower cervical spine which is visualized. Degenerative changes of the thoracic and lumbar spine. The greatest a gray of narrowing in the thoracic region is at the level of T4 where there is facet spurring posteriorly, narrowing the canal to 7 mm. Degenerative changes throughout the lumbar spine contributes to endplate changes, Schmorl's nodes, anterior osteophyte production. Vacuum disc phenomenon present at L2-L3, L3-L4, L4-L5. Degenerative changes of the hips. There is a lucent/hypodense focus within the central portion of the L4 vertebral body. No other lucent foci identified. No acute fracture identified. 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 Intramural hematoma appears to terminate just above the right renal artery. The above preliminary results were discussed by telephone at the time of interpretation on 01/29/2020 at 10:05 am with Dr. Aletta Edouard. Aortic atherosclerosis and associated coronary artery disease. Mild iliac arterial disease. Aortic Atherosclerosis (ICD10-I70.0). Ascending aorta measures 42 mm. Recommend annual imaging followup by CTA or MRA. This recommendation follows 2010 ACCF/AHA/AATS/ACR/ASA/SCA/SCAI/SIR/STS/SVM Guidelines for the Diagnosis and Management of Patients with Thoracic Aortic Disease. Circulation. 2010; 121: G387-F643 There is a single lucent/hypodense lesion of the L4 vertebral body, with no comparison studies. While metastatic disease is unlikely given the single lesion, this cannot be excluded and correlation with any known history of malignancy may be useful. Additional ancillary findings as above. Signed, Dulcy Fanny. Dellia Nims, RPVI [facility] Electronically Signed   By: Corrie Mckusick D.O.   On: 01/29/2020 11:45    Procedures .Critical Care Performed by: Hayden Rasmussen, MD Authorized by: Hayden Rasmussen, MD   Critical care provider statement:    Critical care time (minutes):  80   Critical care time was exclusive of:  Separately billable procedures and treating other patients   Critical care was necessary to treat or prevent imminent or life-threatening deterioration of the following conditions:  Circulatory failure   Critical care was time spent personally by me on the following activities:  Discussions with consultants, evaluation of patient's response to treatment, examination of patient, ordering and performing treatments and interventions, ordering  and review of laboratory studies, ordering and review of radiographic studies, pulse oximetry, re-evaluation of patient's condition, obtaining history from patient or surrogate, review of old charts and development of treatment plan with patient or surrogate   I assumed direction of critical care for this patient from another provider in my specialty: no     (including critical care time)  Medications Ordered in ED Medications  aspirin chewable tablet 324 mg (324 mg Oral Not Given 01/29/20 0714)  clevidipine (CLEVIPREX) infusion 0.5 mg/mL (21 mg/hr Intravenous New Bag/Given 01/29/20 1523)  docusate sodium (COLACE) capsule 100 mg (has no administration in time range)  polyethylene glycol (MIRALAX / GLYCOLAX) packet 17 g (has no administration in time range)  sodium chloride flush (NS) 0.9 % injection 3 mL (3 mLs Intravenous Given 01/29/20 1256)  sodium chloride flush (NS) 0.9 %  injection 3 mL (has no administration in time range)  0.9 %  sodium chloride infusion (has no administration in time range)  ondansetron (ZOFRAN) injection 4 mg (has no administration in time range)  famotidine (PEPCID) tablet 20 mg (has no administration in time range)  fentaNYL (SUBLIMAZE) injection 25-50 mcg (50 mcg Intravenous Given 01/29/20 1624)  labetalol (NORMODYNE) injection 10-20 mg (20 mg Intravenous Given 01/29/20 1446)  clevidipine (CLEVIPREX) infusion 0.5 mg/mL (21 mg/hr Intravenous New Bag/Given 01/29/20 1616)  potassium chloride SA (KLOR-CON) CR tablet 40 mEq (40 mEq Oral Given 01/29/20 1625)  morphine 4 MG/ML injection 4 mg (4 mg Intravenous Given 01/29/20 0854)  iohexol (OMNIPAQUE) 350 MG/ML injection 100 mL (100 mLs Intravenous Contrast Given 01/29/20 0931)  morphine 4 MG/ML injection 4 mg (4 mg Intravenous Given 01/29/20 1013)    ED Course  I have reviewed the triage vital signs and the nursing notes.  Pertinent labs & imaging results that were available during my care of the patient were reviewed by me  and considered in my medical decision making (see chart for details).  Clinical Course as of Jan 29 1724  Tue Jan 29, 2020  0752 Chest x-ray interpreted by me as cardiomegaly no acute infiltrates.   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  Stat paged out to vascular surgery.  Updated patient and his daughter.   [MB]  78 Received a call from Dr. Trula Slade from vascular surgery.  He is recommending patient be admitted to the intensive care unit at [facility], Cleviprex drip, keep blood pressure under 130.    [MB]  61 Discussed with Dr. Vanita Panda ED physician at [facility] who accepts the patient for transfer.  [facility] will provide transportation.   [MB]  54 [facility] is now telling me that they have a bed available and to heart on [facility].  I have placed admitting orders for that bed.   [MB]    Clinical Course User Index [MB] Hayden Rasmussen, MD   MDM Rules/Calculators/A&P                         This patient complains of back pain into chest, weakness in his legs; this involves an extensive number of treatment Options and is a complaint that carries with it a high risk of complications and Morbidity. The differential includes ACS, pneumonia, pneumothorax, dissection, PE, musculoskeletal  I ordered, reviewed and interpreted labs, which included CBC with normal white count normal hemoglobin, chemistry with mildly low potassium, normal renal function, low bicarb, urinalysis unremarkable, troponin slightly elevated and rising, Covid testing negative I ordered medication IV pain medicine, IV blood pressure management I ordered imaging studies which included CT chest abdomen pelvis dissection protocol and I independently    visualized and interpreted imaging which showed type B aortic dissection Additional history obtained from patient's daughter Previous records obtained and reviewed in epic, no recent  notes I consulted Dr. Trula Slade vascular surgery and discussed lab and imaging findings  Critical Interventions: Identification of the management of aortic dissection and elevated blood pressure  After the interventions stated above, I reevaluated the patient and found patient symptoms to be improved.  He is emergently being transferred down to [facility] for evaluation by vascular and critical care.  [facility] transport team given parameters for blood pressure goals.  They are starting to titrate the drip during transport.  Final Clinical Impression(s) / ED Diagnoses Final diagnoses:  Dissection of thoracic aorta [facility])  Hypertensive emergency    Rx / DC Orders ED Discharge Orders    None       Hayden Rasmussen, MD 01/29/20 1731

## 2020-01-29 NOTE — Progress Notes (Addendum)
Patient complaining of worsening leg weakness and numbness, especially on the left leg. Left leg spasms visible. Pt reports no loss of sensation, bilaterally. Bilateral motor strength 5/5. Pedal pulses +3. Paged Dr. Trula Slade, MD - will come see patient.     Fuller Canada, RN 01/29/2020 1800

## 2020-01-29 NOTE — ED Notes (Signed)
Attempted to call report to [place]. Secretary stated RN was on the phone with [facility]. States she will have RN call [facility] if she has any questions.

## 2020-01-29 NOTE — Progress Notes (Signed)
Back from MRI  Called Dr Trula Slade to notify unable to complete add on of lumbar MRI d/t pt discomfort, claustrophobia and movement during images.

## 2020-01-29 NOTE — Progress Notes (Signed)
eLink Physician-Brief Progress Note Patient Name: Scott [name] DOB: 01-26-1951 MRN: [id number]   Date of Service  01/29/2020  HPI/Events of Note  Patient going for MRI and is claustrophobic. CAt = 98% and RR = 16.  eICU Interventions  Plan: 1. Versed 1 mg IV X 1 now.      Intervention Category Major Interventions: Delirium, psychosis, severe agitation - evaluation and management  Shalinda Burkholder,Nochum Eugene 01/29/2020, 8:08 PM

## 2020-01-29 NOTE — Progress Notes (Signed)
Vascular and Vein Specialist of [facility]  Patient name: Scott [name] MRN: [id number] DOB: 11/27/1950 Sex: male   REQUESTING PROVIDER:    ER   REASON FOR CONSULT:    Aortic intramural hematoma  HISTORY OF PRESENT ILLNESS:   Scott [name] is a 69 y.o. male, who presented to the [facility] emergency department earlier today with mid back pain.  He was also experiencing weakness in his legs.  He underwent CT imaging that showed a penetrating thoracic aortic ulcer as well as intramural hematoma.  He was hypertensive with systolic pressures greater than 200.  Aggressive blood pressure control was recommended as well as transferred to [facility].  Upon arrival at [facility], the leg weakness had completely resolved.  His pain had improved.  He denied any abdominal pain.  His only medical problem is hypertension.  He stopped taking his medicine approximately 2 months ago.  He is a non-smoker  PAST MEDICAL HISTORY    Past Medical History:  Diagnosis Date   Hypertension      FAMILY HISTORY   No family history on file.  SOCIAL HISTORY:   Social History   Socioeconomic History   Marital status: Married    Spouse name: Not on file   Number of children: Not on file   Years of education: Not on file   Highest education level: Not on file  Occupational History   Not on file  Tobacco Use   Smoking status: Never Smoker  Substance and Sexual Activity   Alcohol use: No   Drug use: No   Sexual activity: Not on file  Other Topics Concern   Not on file  Social History Narrative   Not on file   Social Determinants of Health   Financial Resource Strain:    Difficulty of Paying Living Expenses:   Food Insecurity:    Worried About [place] in the Last Year:    Arboriculturist in the Last Year:   Transportation Needs:    Film/video editor (Medical):    Lack of Transportation (Non-Medical):   Physical Activity:    Days  of Exercise per Week:    Minutes of Exercise per Session:   Stress:    Feeling of Stress :   Social Connections:    Frequency of Communication with Friends and Family:    Frequency of Social Gatherings with Friends and Family:    Attends Religious Services:    Active Member of Clubs or Organizations:    Attends Archivist Meetings:    Marital Status:   Intimate Partner Violence:    Fear of Current or Ex-Partner:    Emotionally Abused:    Physically Abused:    Sexually Abused:     ALLERGIES:    No Known Allergies  CURRENT MEDICATIONS:    Current Facility-Administered Medications  Medication Dose Route Frequency Provider Last Rate Last Admin   0.9 %  sodium chloride infusion  250 mL Intravenous PRN Magdalen Spatz, NP       acetaminophen (TYLENOL) tablet 650 mg  650 mg Oral Q4H PRN Rigoberto Noel, MD       aspirin chewable tablet 324 mg  324 mg Oral Once Hayden Rasmussen, MD       Chlorhexidine Gluconate Cloth 2 % PADS 6 each  6 each Topical Daily Rigoberto Noel, MD       clevidipine (CLEVIPREX) infusion 0.5 mg/mL  0-21 mg/hr Intravenous Continuous Kara Mead  V, MD 36 mL/hr at 01/29/20 1900 18 mg/hr at 01/29/20 1900   docusate sodium (COLACE) capsule 100 mg  100 mg Oral BID PRN Magdalen Spatz, NP       famotidine (PEPCID) tablet 20 mg  20 mg Oral BID Magdalen Spatz, NP       fentaNYL (SUBLIMAZE) injection 25-50 mcg  25-50 mcg Intravenous Q1H PRN Shearon Stalls, Rahul P, PA-C   50 mcg at 01/29/20 1824   labetalol (NORMODYNE) injection 10-20 mg  10-20 mg Intravenous Q10 min PRN Rigoberto Noel, MD   20 mg at 01/29/20 1828   midazolam (VERSED) injection 1 mg  1 mg Intravenous Once Anders Simmonds, MD       nitroGLYCERIN 50 mg in dextrose 5 % 250 mL (0.2 mg/mL) infusion  0-100 mcg/min Intravenous Titrated Rigoberto Noel, MD 18 mL/hr at 01/29/20 1900 60 mcg/min at 01/29/20 1900   ondansetron (ZOFRAN) injection 4 mg  4 mg Intravenous Q6H PRN Magdalen Spatz,  NP       polyethylene glycol (MIRALAX / GLYCOLAX) packet 17 g  17 g Oral Daily PRN Magdalen Spatz, NP       sodium chloride flush (NS) 0.9 % injection 3 mL  3 mL Intravenous Q12H Magdalen Spatz, NP   3 mL at 01/29/20 1256   sodium chloride flush (NS) 0.9 % injection 3 mL  3 mL Intravenous PRN Magdalen Spatz, NP        REVIEW OF SYSTEMS:   [X]  denotes positive finding, [ ]  denotes negative finding Cardiac  Comments:  Chest pain or chest pressure: x   Shortness of breath upon exertion:    Short of breath when lying flat:    Irregular heart rhythm:        Vascular    Pain in calf, thigh, or hip brought on by ambulation:    Pain in feet at night that wakes you up from your sleep:     Blood clot in your veins:    Leg swelling:         Pulmonary    Oxygen at home:    Productive cough:     Wheezing:         Neurologic    Sudden weakness in arms or legs:     Sudden numbness in arms or legs:     Sudden onset of difficulty speaking or slurred speech:    Temporary loss of vision in one eye:     Problems with dizziness:         Gastrointestinal    Blood in stool:      Vomited blood:         Genitourinary    Burning when urinating:     Blood in urine:        Psychiatric    Major depression:         Hematologic    Bleeding problems:    Problems with blood clotting too easily:        Skin    Rashes or ulcers:        Constitutional    Fever or chills:     PHYSICAL EXAM:   Vitals:   01/29/20 1915 01/29/20 1930 01/29/20 1936 01/29/20 1945  BP: 109/66 137/61  121/67  Pulse: (!) 57 67  (!) 58  Resp: 13 14  16   Temp:   97.9 F (36.6 C)   TempSrc:   Oral   SpO2: 92% 95%  95%  Height:        GENERAL: The patient is a well-nourished male, in no acute distress. The vital signs are documented above. CARDIAC: There is a regular rate and rhythm.  VASCULAR: Palpable femoral and pedal pulses bilaterally PULMONARY: Nonlabored respirations ABDOMEN: Soft and non-tender    MUSCULOSKELETAL: There are no major deformities or cyanosis. NEUROLOGIC: No focal weakness or paresthesias are detected. SKIN: There are no ulcers or rashes noted. PSYCHIATRIC: The patient has a normal affect.  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 Intramural hematoma appears to terminate just above the right renal artery.  The above preliminary results were discussed by telephone at the time of interpretation on 01/29/2020 at 10:05 am with Dr. Aletta Edouard.  Aortic atherosclerosis and associated coronary artery disease. Mild iliac arterial disease. Aortic Atherosclerosis (ICD10-I70.0).  Ascending aorta measures 42 mm. Recommend annual imaging followup by CTA or MRA. This recommendation follows 2010 ACCF/AHA/AATS/ACR/ASA/SCA/SCAI/SIR/STS/SVM Guidelines for the Diagnosis and Management of Patients with Thoracic Aortic Disease. Circulation. 2010; 121: Z610-R604  There is a single lucent/hypodense lesion of the L4 vertebral body, with no comparison studies. While metastatic disease is unlikely given the single lesion, this cannot be excluded and correlation with any known history of malignancy may be useful.  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  I would recommend blood pressure control and pain management.  If he stabilizes, I would consider endovascular repair in a week or 2.  He will need a repeat CT scan in 48 hours.  The leg weakness he experienced could be related to spinal cord malperfusion however this has  resolved.  He does have a hypodense lesion at L4 which also could be contributing to his weakness.  I will continue to monitor this throughout the day.   Leia Alf, MD, FACS [facility] [phone] Pager [phone]

## 2020-01-29 NOTE — Progress Notes (Signed)
Patient now having recurrent left leg numbness and weakness, which had resolved from presentation.  He has palpable pedal pulses.  I am concerned about possible cord infarct so I will order a stat MRI.  I discussed with the family, that if he has infarcted his cord, treatment of his IMH will not change the outcome. If he just has edema, he would benefit from a spinal drain.  I am goin to increase his target blood pressure to 140.  Hopefully this will help with perfusion.   Scott [name]

## 2020-01-29 NOTE — ED Notes (Signed)
Pt left emergency traffic with [facility]. Per EDP, don't wait for cleviprex. [facility] aware. Pt AOx4.

## 2020-01-29 NOTE — Progress Notes (Signed)
Paged Dr. Elsworth Soho concerning SBP in 140s despite Pt being maxed on Cleviprex and unable to maintain BP goals. Orders to add Nitro. Will continue to monitor.   Gerlene Glassburn 01/29/2020 1700

## 2020-01-29 NOTE — Progress Notes (Signed)
Approx [phone]  To radiology for MRI of thoracic/spine. Pt has claustrophobia but agreeable to try. Called [facility] and received order for versed. In MRI pt required a lot of emotional support, reassurance and education to agree to enter MRI scanner for imaging. Pt refused versed. Did eventually agree to exam, images obtained but per MRI tech not great quality d/t pt movement. Reassurance provided throughout. Lumbar MRI ordered by Dr Trula Slade at end of exam but pt c/o back discomfort and claustrophobia, unable to complete. Back to room. NTG titrated during study for BP control, see MAR.

## 2020-01-29 NOTE — ED Notes (Signed)
[facility] is sending transportation at this time. RN notified.

## 2020-01-29 NOTE — H&P (Signed)
NAME:  Scott [name], MRN:  [id number], DOB:  1951-05-29, LOS: 0 ADMISSION DATE:  01/29/2020, CONSULTATION DATE: 01/29/2020 REFERRING MD: Dr. Melina Copa, CHIEF COMPLAINT: Dissecting abdominal aortic aneurysm  Brief History   69 year old male presents with complaints of 5 out of 10 mid back pain with associated bilateral leg weakness found to have small dissecting abdominal aortic aneurysm on ED evaluation.  Vascular surgery consulted at [facility] who recommended transfer to [facility].  PCCM consulted for further management and admission  History of present illness   Dawid Dupriest is a 69 year old male with past medical history significant for hypertension who presented to the emergency department with 5 out of 10 mid back pain.  Patient reported back pain began around 5 AM and is also associated with bilateral leg weakness that is progressively worsening.  Patient denies any chest pain, abdominal pain, or bowel or bladder incontinence.  On arrival to emergency department patient was seen mildly diaphoretic with continued back pain and lower extremity weakness.  Vital signs significant hypertensive urgency with a blood pressure of 208/96.  Lab work significant for mild hyperglycemia and mild hypokalemia.  EKG with sinus rhythm and multiple PVCs.  CT angiogram chest/abdomen/pelvis obtained on arrival that revealed small dissecting abdominal aortic aneurysm.  At this time vascular surgery was consulted by [facility] emergency department physician who recommended transfer to [facility] for further evaluation.  Vascular surgery recommended critical care admit.  Of note patient reports he recently ran out of his antihypertensives and when he called his PCP for refills he was requested to present for follow up but he failed to do so.    Past Medical History  Hypertension  Significant Hospital Events   Admitted 8/10  Consults:  Vascular surgery  Procedures:  None  Significant Diagnostic Tests:  CT angio  chest/abdomen/pelvis 8/10 >   Micro Data:  COVID 8/10 >   Antimicrobials:  None  Interim history/subjective:  Lying in bed with improved back pain. Reports he lost sensation and movement to bilateral legs from waist down but both sensation and movement has returned.   Objective   Blood pressure (!) 207/95, pulse 67, temperature (!) 97.4 F (36.3 C), temperature source Oral, resp. rate 14, height 5' 8.5" (1.74 m), SpO2 99 %.       No intake or output data in the 24 hours ending 01/29/20 1039 There were no vitals filed for this visit.  Examination: General: Pleasant adult male lying in bed in no acute distress HEENT: [place]/AT, MM pink/moist, PERRL, sclera non-icteric  Neuro: Alert and oriented x3, non focal , bilateral leg strength 5/5 CV: s1s2 regular rate and rhythm, no murmur, rubs, or gallops, palpable pedal pulses bilaterally  PULM:  Clear to ascultation bilaterally, no added breath sounds, no increased work of breathing GI: soft, bowel sounds active in all 4 quadrants, non-tender, non-distended Extremities: warm/dry, no edema  Skin: no rashes or lesions   EKG with NSR and multiple PVC   Resolved Hospital Problem list     Assessment & Plan:  Dissecting abdominal aortic aneurysm -Patient presented with 5 out of 10 mid thoracic back pain with associated bilateral progressive leg weakness -CTA chest/abdomen/pelvis positive for dissecting aortic aneurysm and on ED arrival P: Vascular surgery has been consulted Primary management per vascular surgery Blood pressure management, see below Close monitoring in the ICU setting Ensure adequate pain control  Obtian and maintain two large bore IV  Obtain type and screen  Frequent neurovascular checks  Hypertensive urgency  -Patient has a history of HTN medications reconciliation is pending but it appears patient is currently on no chronic antihypertensives  -Blood pressure was 207/109 on admission P: Goal for HR less than 60  will utilize PRN IV beta blockers Continue vasodilator with nicardipine drip  Continuous telemetry  Close monitoring of hemodynamics   Best practice:  Diet: NPO Pain/Anxiety/Delirium protocol (if indicated): PRNs VAP protocol (if indicated): N/A DVT prophylaxis: SCD GI prophylaxis: PPI Glucose control: SSI Mobility: Bedrest Code Status: Full Family Communication: Will update on arrival  Disposition: ICU  Labs   CBC: Recent Labs  Lab 01/29/20 0810  WBC 8.7  HGB 15.4  HCT 45.7  MCV 99.6  PLT 768    Basic Metabolic Panel: Recent Labs  Lab 01/29/20 0810  NA 140  K 3.1*  CL 105  CO2 20*  GLUCOSE 138*  BUN 22  CREATININE 1.05  CALCIUM 8.9   GFR: CrCl cannot be calculated (Unknown ideal weight.). Recent Labs  Lab 01/29/20 0810  WBC 8.7    Liver Function Tests: No results for input(s): AST, ALT, ALKPHOS, BILITOT, PROT, ALBUMIN in the last 168 hours. No results for input(s): LIPASE, AMYLASE in the last 168 hours. No results for input(s): AMMONIA in the last 168 hours.  ABG No results found for: PHART, PCO2ART, PO2ART, HCO3, TCO2, ACIDBASEDEF, O2SAT   Coagulation Profile: No results for input(s): INR, PROTIME in the last 168 hours.  Cardiac Enzymes: No results for input(s): CKTOTAL, CKMB, CKMBINDEX, TROPONINI in the last 168 hours.  HbA1C: No results found for: HGBA1C  CBG: Recent Labs  Lab 01/29/20 0645  GLUCAP 143*    Review of Systems: Positive in bold   Gen: Denies fever, chills, weight change, fatigue, night sweats HEENT: Denies blurred vision, double vision, hearing loss, tinnitus, sinus congestion, rhinorrhea, sore throat, neck stiffness, dysphagia PULM: Denies shortness of breath, cough, sputum production, hemoptysis, wheezing CV: Denies chest pain, edema, orthopnea, paroxysmal nocturnal dyspnea, palpitations GI: Denies abdominal pain, nausea, vomiting, diarrhea, hematochezia, melena, constipation, change in bowel habits GU: Denies  dysuria, hematuria, polyuria, oliguria, urethral discharge Endocrine: Denies hot or cold intolerance, polyuria, polyphagia or appetite change Derm: Denies rash, dry skin, scaling or peeling skin change Musculoskeletal: Mid back pain  Heme: Denies easy bruising, bleeding, bleeding gums Neuro: Denies headache, numbness, bilateral leg weakness, slurred speech, loss of memory or consciousness  Past Medical History  He,  has a past medical history of Hypertension.   Surgical History   No past surgical history on file.   Social History   reports that he has never smoked. He does not have any smokeless tobacco history on file. He reports that he does not drink alcohol and does not use drugs.   Family History   His family history is not on file.   Allergies No Known Allergies   Home Medications  Prior to Admission medications   Medication Sig Start Date End Date Taking? Authorizing Provider  ALPRAZolam (XANAX) 0.25 MG tablet Take 1 tablet (0.25 mg total) by mouth at bedtime as needed for sleep. 04/03/13   Ashley Murrain, NP  cephALEXin (KEFLEX) 500 MG capsule Take 1 capsule (500 mg total) by mouth 4 (four) times daily. 11/16/14   Ashley Murrain, NP  famotidine (PEPCID) 20 MG tablet Take 1 tablet (20 mg total) by mouth 2 (two) times daily. 04/03/13   Ashley Murrain, NP  PRESCRIPTION MEDICATION Take 0.5 tablets by mouth every 4 (four) hours as needed (  pain). Prescription pain pill. **patient unsure of name**    [provider]     Critical care time:    Performed by: Johnsie Cancel  Total critical care time: 45 minutes  Critical care time was exclusive of separately billable procedures and treating other patients.  Critical care was necessary to treat or prevent imminent or life-threatening deterioration.  Critical care was time spent personally by me on the following activities: development of treatment plan with patient and/or surrogate as well as nursing, discussions with  consultants, evaluation of patient's response to treatment, examination of patient, obtaining history from patient or surrogate, ordering and performing treatments and interventions, ordering and review of laboratory studies, ordering and review of radiographic studies, pulse oximetry and re-evaluation of patient's condition.  Johnsie Cancel, NP-C [facility] Contact / Pager information can be found on Amion  01/29/2020, 11:07 AM

## 2020-01-29 NOTE — ED Triage Notes (Addendum)
Back pain that radiates to cp since 0530 am. Pt clammy and diaphoretic. Generalized weakness.

## 2020-01-29 NOTE — ED Notes (Signed)
Pt says feeling is coming back in his legs and he is starting to feel better.  Pt refused morphine, says he doesn't feel like he needs it at this time.

## 2020-01-29 NOTE — ED Notes (Signed)
Pharmacy notified for STAT clevidipine gtt.

## 2020-01-29 NOTE — Progress Notes (Signed)
  Echocardiogram 2D Echocardiogram has been performed.  Scott [name] 01/29/2020, 1:33 PM

## 2020-01-29 NOTE — Progress Notes (Signed)
I have reviewed his MRI with radiology.  It is not the best study due to patient movement, but there is no obvious spinal cord edema or infarct.  I have changed his target blood pressure goal to systolic pressure of 818-563.  I have also asked neurology to evaluate the patient as he doe shave some chronic degenerative changes seen on MRi.  Scott [name]

## 2020-01-30 ENCOUNTER — Other Ambulatory Visit: Payer: Self-pay

## 2020-01-30 ENCOUNTER — Inpatient Hospital Stay (HOSPITAL_COMMUNITY): Payer: No Typology Code available for payment source

## 2020-01-30 DIAGNOSIS — I161 Hypertensive emergency: Secondary | ICD-10-CM | POA: Diagnosis not present

## 2020-01-30 DIAGNOSIS — I7102 Dissection of abdominal aorta: Secondary | ICD-10-CM | POA: Diagnosis not present

## 2020-01-30 DIAGNOSIS — G9511 Acute infarction of spinal cord (embolic) (nonembolic): Secondary | ICD-10-CM

## 2020-01-30 LAB — BASIC METABOLIC PANEL
Anion gap: 10 (ref 5–15)
BUN: 18 mg/dL (ref 8–23)
CO2: 22 mmol/L (ref 22–32)
Calcium: 8.7 mg/dL — ABNORMAL LOW (ref 8.9–10.3)
Chloride: 106 mmol/L (ref 98–111)
Creatinine, Ser: 1 mg/dL (ref 0.61–1.24)
GFR calc Af Amer: >60 mL/min (ref >60)
GFR calc non Af Amer: >60 mL/min (ref >60)
Glucose, Bld: 141 mg/dL — ABNORMAL HIGH (ref 70–99)
Potassium: 4.6 mmol/L (ref 3.5–5.1)
Sodium: 138 mmol/L (ref 135–145)

## 2020-01-30 LAB — CBC
HCT: 39.4 % (ref 39.0–52.0)
Hemoglobin: 13.4 g/dL (ref 13.0–17.0)
MCH: 33.7 pg (ref 26.0–34.0)
MCHC: 34 g/dL (ref 30.0–36.0)
MCV: 99 fL (ref 80.0–100.0)
Platelets: 184 10*3/uL (ref 150–400)
RBC: 3.98 MIL/uL — ABNORMAL LOW (ref 4.22–5.81)
RDW: 13 % (ref 11.5–15.5)
WBC: 13 10*3/uL — ABNORMAL HIGH (ref 4.0–10.5)
nRBC: 0 % (ref 0.0–0.2)

## 2020-01-30 LAB — TRIGLYCERIDES: Triglycerides: 66 mg/dL (ref <150)

## 2020-01-30 LAB — MAGNESIUM: Magnesium: 1.9 mg/dL (ref 1.7–2.4)

## 2020-01-30 LAB — HEMOGLOBIN A1C
Hgb A1c MFr Bld: 5.6 % (ref 4.8–5.6)
Mean Plasma Glucose: 114.02 mg/dL

## 2020-01-30 LAB — PHOSPHORUS: Phosphorus: 2.8 mg/dL (ref 2.5–4.6)

## 2020-01-30 MED ORDER — LABETALOL HCL 100 MG PO TABS
100.0000 mg | ORAL_TABLET | Freq: Two times a day (BID) | ORAL | Status: DC
  Administered 2020-01-30 – 2020-02-05 (×13): 100 mg via ORAL
  Filled 2020-01-30 (×14): qty 1

## 2020-01-30 MED ORDER — OXYCODONE HCL 5 MG PO TABS
5.0000 mg | ORAL_TABLET | ORAL | Status: DC | PRN
  Administered 2020-01-30 – 2020-02-03 (×14): 5 mg via ORAL
  Filled 2020-01-30 (×14): qty 1

## 2020-01-30 MED ORDER — AMLODIPINE BESYLATE 5 MG PO TABS
5.0000 mg | ORAL_TABLET | Freq: Once | ORAL | Status: AC
  Administered 2020-01-30: 5 mg via ORAL
  Filled 2020-01-30: qty 1

## 2020-01-30 MED ORDER — IOHEXOL 350 MG/ML SOLN
100.0000 mL | Freq: Once | INTRAVENOUS | Status: AC | PRN
  Administered 2020-01-30: 100 mL via INTRAVENOUS

## 2020-01-30 MED ORDER — LABETALOL HCL 5 MG/ML IV SOLN
20.0000 mg | INTRAVENOUS | Status: DC | PRN
  Administered 2020-01-31 – 2020-02-02 (×4): 20 mg via INTRAVENOUS
  Filled 2020-01-30 (×4): qty 4

## 2020-01-30 MED ORDER — HYDRALAZINE HCL 20 MG/ML IJ SOLN
10.0000 mg | Freq: Four times a day (QID) | INTRAMUSCULAR | Status: DC | PRN
  Administered 2020-01-30 – 2020-02-05 (×8): 10 mg via INTRAVENOUS
  Filled 2020-01-30 (×8): qty 1

## 2020-01-30 MED ORDER — MORPHINE SULFATE (PF) 2 MG/ML IV SOLN
1.0000 mg | INTRAVENOUS | Status: DC | PRN
  Administered 2020-01-30 – 2020-02-06 (×17): 2 mg via INTRAVENOUS
  Filled 2020-01-30 (×17): qty 1

## 2020-01-30 MED ORDER — AMLODIPINE BESYLATE 5 MG PO TABS
5.0000 mg | ORAL_TABLET | Freq: Every day | ORAL | Status: DC
  Administered 2020-01-30: 5 mg via ORAL
  Filled 2020-01-30: qty 1

## 2020-01-30 MED ORDER — AMLODIPINE BESYLATE 10 MG PO TABS
10.0000 mg | ORAL_TABLET | Freq: Every day | ORAL | Status: DC
  Administered 2020-01-31 – 2020-02-06 (×7): 10 mg via ORAL
  Filled 2020-01-30 (×7): qty 1

## 2020-01-30 NOTE — Progress Notes (Signed)
Notified Whitney NP about pt now having pain in abdomen area. Pt states its like pressure. Pt states it may be a bowel movement. Pt given prn miralax. Pt agitated but easily corporates. Pt states Fentanyl does help with pain. New orders received. Advised if pain continues to notify vascular MD. Will continue to monitor and report to night shift RN.

## 2020-01-30 NOTE — Progress Notes (Signed)
Patient ID: Scott [name], male   DOB: [birth year], 69 y.o.   MRN: [id number] Comfortable currently.  Abdominal pain improved with pain medication.  Repeat CT scan shows evolution of the dissection.  He no longer has an entry point in his proximal descending thoracic aorta.  Intramural hematoma is unchanged.  He now has a dissection flap through his abdominal aorta.  There is normal flow through the true lumen to the celiac, SMA and both renal arteries.  There is some impingement on a lower pole accessory left renal artery.  He also now has some flow-limiting stenosis in his left common iliac artery.  On physical exam his abdomen is soft and nontender.  He has 3+ dorsalis pedis pulse on the right.  I do not palpate pedal pulses on the left.  He has audible dorsalis pedis and posterior tibial Doppler flow on the left.  He is motor and sensory intact in his left foot.  Stable.  Evolving changes from his aortic dissection.  Continue to work on blood pressure control and pain control.  I discussed the CT findings with the patient who understands

## 2020-01-30 NOTE — Progress Notes (Signed)
Dr Cheral Marker, neurology, at bedside assessing pt.

## 2020-01-30 NOTE — Progress Notes (Addendum)
[facility]  Subjective  - States left leg feels"normal" and much better.     Objective (!) 150/78 (!) 57 97.7 F (36.5 C) (Oral) 17 96%  Intake/Output Summary (Last 24 hours) at 01/30/2020 0823 Last data filed at 01/30/2020 0809 Gross per 24 hour  Intake 844.31 ml  Output 1175 ml  Net -330.69 ml    Moving all 4 ext.  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  BP being monitored with support of Nitro @ 35 mcg/min. Neurology: 1. Exam reveals 4/5 LLE weakness with hyperreflexia, positive Babinski sign and slight hyperesthesia to temperature. RLE strength and sensation are normal. 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  2. Given that strength and sensation in the LLE are mostly preserved, the lesion inferred from the exam is most likely too small to be visualizable on repeat MRI scan.  3. Risks of endovascular assessment with catheter based angiography of the spinal arteries are felt to significantly outweigh the potential benefit of the diagnostic information obtained.  4. Anticoagulation is not an option in this case.    They agree to continue with BP control, ASA and if repeat imaging of MRI is recommended they would like to be notified to review the study.   Will order repeat CTA tomorrow order placed.  Roxy Horseman 01/30/2020 8:23 AM --  Laboratory Lab Results: Recent Labs    01/29/20 0810 01/30/20 0121  WBC 8.7 13.0*  HGB 15.4 13.4  HCT 45.7 39.4  PLT 206 184   BMET Recent Labs    01/29/20 0810 01/30/20 0121  NA 140 138  K 3.1* 4.6  CL 105 106  CO2 20* 22  GLUCOSE 138* 141*  BUN 22  18  CREATININE 1.05 1.00  CALCIUM 8.9 8.7*    COAG Lab Results  Component Value Date   INR 1.1 01/29/2020   No results found for: PTT    Left leg numbness is improved from yesterday.  It is now back to near baseline. BP target was increased to 14-0150 which helped his leg.  His back pain is also much improved. Appreciate neurology evaluation.  Possible small cord event.  MRI unremarkable for ischemia Plan for continued ICU monitoring, blood pressure and pain control We discussed potentially proceeding with early repair, however he has some reservations with stenting.    Annamarie Major

## 2020-01-30 NOTE — Progress Notes (Signed)
Called to attempt to get patient for MRI. Patient refusing scan.

## 2020-01-30 NOTE — Progress Notes (Signed)
Dr Early with Vascular made aware of pt's continued pain. Pt rates pain 10/10 in lower back. Recommend CT scan to be done tonight in next few hours rather than am and added pain medication. Will report to night shift RN.

## 2020-01-30 NOTE — Consult Note (Addendum)
NEURO HOSPITALIST CONSULT NOTE   Requestig physician: Dr. Elsworth Soho  Reason for Consult: Acute onset of waxing and waning LLE weakness in the context of thoracic aortic dissection  History obtained from:  Patient and Chart    HPI:                                                                                                                                          Grainger Mccarley is an 69 y.o. male with HTN (noncompliant with meds) who presented to the [facility] on Tuesday AM via POV with acute onset of back and chest pain which began at 0530 the same day. He was clammy and diaphoretic on arrival, with generalized weakness. In conjunction with these symptoms, he had developed LLE weakness starting at the knee, with a sensation that it would give out. The weakness migrated distally to his left foot as well as proximally up his LLE to his thigh and hip. When he pinched his left thigh, he could not feel it. Strength and sensation in his RLE were normal. He had no saddle anesthesia or bowel/bladder incontinence. Upper extremities were asymptomatic and he also had no vision changes, facial weakness, facial numbness or slurred speech.   After he arrived to the ED, he felt that the sensory deficit was improving. CT of chest abdomen pelvis was obtained to assess for possible dissection; the scan, per preliminary review by the EDP, revealed a type B aortic dissection. He was started on clevidipine gtt and emergently transported to [facility] for further evaluation.   On assessment by the CCM team at [facility], it was further noted that CT angiogram chest/abdomen/pelvis showed a tear at the origin of the left subclavian, with intramural hematoma extending all the way down to about the takeoff of the renal arteries. The patient was started on IV nitro and Cardene with good control of his blood pressure. On arrival to the ICU he reported pain was decreased to 2/10, with no headache or abdominal pain. Exam by CCM team  revealed good peripheral pulses, power 5/5 both lower extremities and bilaterally equal sensation.   The patient's LLE weakness subsequently recurred, then continued to wax and wane. An MRI of the thoracic spine was obtained, revealing no definite spinal cord abnormality; degenerative spondylosis without cord compression was noted.   Neurology was consulted to assess for possible spinal cord infarction not visible on MRI.   Past Medical History:  Diagnosis Date  . Hypertension     No past surgical history on file.  No family history on file.            Social History:  reports that he has never smoked. He does not have any smokeless tobacco history on file. He reports that he does not drink alcohol  and does not use drugs.  No Known Allergies  MEDICATIONS:                                                                                                                     Scheduled: . aspirin  324 mg Oral Once  . Chlorhexidine Gluconate Cloth  6 each Topical Daily  . famotidine  20 mg Oral BID  . midazolam  1 mg Intravenous Once  . sodium chloride flush  3 mL Intravenous Q12H   Continuous: . sodium chloride    . clevidipine 18 mg/hr (01/29/20 1900)  . nitroGLYCERIN 60 mcg/min (01/29/20 1900)    ROS:                                                                                                                                       As per HPI. Comprehensive ROS otherwise negative.    Blood pressure (!) 146/76, pulse 62, temperature 97.7 F (36.5 C), temperature source Oral, resp. rate 18, height 5' 8.5" (1.74 m), SpO2 98 %.   General Examination:                                                                                                       Physical Exam  HEENT-  [place]/AT    Lungs- Respirations unlabored Extremities- Warm and well perfused   Neurological Examination Mental Status: Alert, oriented, thought content appropriate.  Speech fluent without evidence of aphasia.   Able to follow all commands without difficulty. No dysarthria. Cranial Nerves: II: Visual fields intact with no extinction to DSS. PERRL. III,IV, VI: No ptosis. EOMI. No nystagmus.  V,VII: Smile symmetric, facial temp sensation equal bilaterally VIII: Hearing intact to voice IX,X: No hypophonia XI: Symmetric shoulder shrug XII: Midline tongue extension Motor: BUE 5/5 proximally and distally without asymmetry No pronator drift.  RLE 5/5 proximally and distally LLE 4+/5 proximally and distally Sensory: LLE with mild hyperesthesia to cool temp. RLE and BUE with normal temp sensation. FT  intact x 4. No extinction to DSS.   Deep Tendon Reflexes: 2+ bilateral biceps and brachioradialis.  4+ left patellar and achilles (induces clonus) 3+ right patellar, 2+ right achilles.  Plantars: Right: Downgoing   Left: Upgoing Cerebellar: No ataxia with FNF bilaterally.  Gait: Deferred   Lab Results: Basic Metabolic Panel: Recent Labs  Lab 01/29/20 0810  NA 140  K 3.1*  CL 105  CO2 20*  GLUCOSE 138*  BUN 22  CREATININE 1.05  CALCIUM 8.9    CBC: Recent Labs  Lab 01/29/20 0810  WBC 8.7  HGB 15.4  HCT 45.7  MCV 99.6  PLT 206    Cardiac Enzymes: No results for input(s): CKTOTAL, CKMB, CKMBINDEX, TROPONINI in the last 168 hours.  Lipid Panel: No results for input(s): CHOL, TRIG, HDL, CHOLHDL, VLDL, LDLCALC in the last 168 hours.  Imaging: MR THORACIC SPINE WO CONTRAST  Result Date: 01/29/2020 CLINICAL DATA:  69 year old male with recently discovered abnormal thoracic aorta, type B dissection versus penetrating ulcer with extensive intramural hematoma. Unexplained left leg numbness and weakness. Query thoracic cord infarct. EXAM: MRI THORACIC SPINE WITHOUT CONTRAST TECHNIQUE: Multiplanar, multisequence MR imaging of the thoracic spine was performed. No intravenous contrast was administered. COMPARISON:  CTA chest abdomen and pelvis earlier today. FINDINGS: The patient declined  anxiolysis medication prior to scanning, and the study is intermittently degraded by motion artifact despite repeated imaging attempts. Limited cervical spine imaging: Multilevel cervical disc and endplate degeneration although no high-grade cervical spinal stenosis is suspected. Thoracic spine segmentation:  Normal on the comparison CTA. Alignment: Stable thoracic kyphosis with mild underlying thoracic scoliosis from the recent CTA. No spondylolisthesis. Vertebrae: No marrow edema or evidence of acute osseous abnormality. Visualized bone marrow signal is within normal limits. And L1 benign vertebral hemangioma is incidentally noted (series 12, image 18). 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 This is in proximity to bulky T4 posterior element hypertrophy which was also demonstrated by CT (see below). Limited thoracic cord detail otherwise due to motion, but these images do not suggest any swelling of the spinal cord as could be seen with acute cytotoxic edema. The conus medullaris appears normal at L1. Paraspinal and other soft tissues: Abnormal thoracic aorta, much less apparent on these images. No pleural or pericardial effusion is evident. Negative visible upper abdominal viscera. Thoracic paraspinal soft tissues appear normal. Disc levels: There is a degree of thoracic epidural lipomatosis most pronounced at the T5-T6 level (series 15, image 18). Most levels demonstrate relatively mild for age thoracic spinal stenosis, with the following exceptions: T3-T4: No significant disc degeneration but moderate posterior element hypertrophy. Moderate right T3 foraminal stenosis. T4-T5: No significant disc degeneration but bulky calcified ligament flavum hypertrophy (series 12, image 18) also seen on CT yesterday. Subsequent mild spinal stenosis. No spinal cord mass effect. T5-T6: Moderate  posterior element hypertrophy greater on the right. Mild right T5 foraminal stenosis. T7-T8: Disc space loss with mild circumferential disc bulge. Mild posterior element hypertrophy. Mild spinal stenosis when combined with epidural lipomatosis. No cord mass effect. T10-T11: Mild disc bulge and moderate posterior element hypertrophy. Mild spinal stenosis, no cord mass effect. Mild to moderate left T10 foraminal stenosis. IMPRESSION: 1. Degraded by motion despite repeated imaging attempts. 2. 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 Suboptimal cord detail elsewhere, although  no levels of cord swelling to suggest ischemic cytotoxic edema. 3. Occasional mild thoracic spinal stenosis related to spine degeneration and/or epidural lipomatosis. Up to moderate degenerative neural foraminal stenosis at the right T3 and left T10 nerve levels. Study was discussed by telephone with Dr. Harold Barban on 01/29/2020 at 22:06. We discussed that if symptoms persist or if expert neurologic exam is suggestive of acute thoracic myelopathy then a repeat thoracic MRI could be pursued (ideally with anxiolysis or sedation meds). Electronically Signed   By: Genevie Ann M.D.   On: 01/29/2020 22:20   DG Chest Portable 1 View  Result Date: 01/29/2020 CLINICAL DATA:  Chest pain EXAM: PORTABLE CHEST 1 VIEW COMPARISON:  None FINDINGS: Lungs are clear. Heart is mildly enlarged with pulmonary vascularity normal. No adenopathy. No pneumothorax. No bone lesions. IMPRESSION: Heart mildly enlarged.  No edema or airspace opacity. Electronically Signed   By: Lowella Grip III M.D.   On: 01/29/2020 07:49   ECHOCARDIOGRAM COMPLETE  Result Date: 01/29/2020    ECHOCARDIOGRAM REPORT   Patient Name:   WINSON EICHORN Date of Exam: 01/29/2020 Medical Rec #:  [id number]  Height:       68.5 in Accession #:    [id number] Weight:       165.0 lb Date of Birth:  1951/05/21  BSA:          1.894 m  Patient Age:    74 years   BP:           126/65 mmHg Patient Gender: M          HR:           64 bpm. Exam Location:  Inpatient Procedure: 2D Echo Indications:    preoperative evaluation  History:        Patient has no prior history of Echocardiogram examinations.                 COPD; Risk Factors:Hypertension.  Sonographer:    Jannett Celestine RDCS (AE) Referring Phys: Aurelia  1. Left ventricular ejection fraction, by estimation, is 50 to 55%. The left ventricle has low normal function. The left ventricle demonstrates regional wall motion abnormalities (see scoring diagram/findings for description). There is moderate concentric left ventricular hypertrophy. Left ventricular diastolic parameters are consistent with Grade I diastolic dysfunction (impaired relaxation). Elevated left atrial pressure. There is moderate hypokinesis of the left ventricular, basal-mid inferolateral wall.  2. Right ventricular systolic function is normal. The right ventricular size is normal. Tricuspid regurgitation signal is inadequate for assessing PA pressure.  3. Left atrial size was moderately dilated.  4. The mitral valve is normal in structure. No evidence of mitral valve regurgitation.  5. The aortic valve is tricuspid. Aortic valve regurgitation is mild to moderate. Mild aortic valve sclerosis is present, with no evidence of aortic valve stenosis. FINDINGS  Left Ventricle: Left ventricular ejection fraction, by estimation, is 50 to 55%. The left ventricle has low normal function. The left ventricle demonstrates regional wall motion abnormalities. Moderate hypokinesis of the left ventricular, basal-mid inferolateral wall. The left ventricular internal cavity size was normal in size. There is moderate concentric left ventricular hypertrophy. Left ventricular diastolic parameters are consistent with Grade I diastolic dysfunction (impaired relaxation). Elevated left atrial pressure. Right Ventricle: The right  ventricular size is normal. No increase in right ventricular wall thickness. Right ventricular systolic function is normal. Tricuspid regurgitation signal is inadequate for assessing PA pressure. Left Atrium: Left atrial size was  moderately dilated. Right Atrium: Right atrial size was normal in size. Pericardium: There is no evidence of pericardial effusion. Mitral Valve: The mitral valve is normal in structure. Mild mitral annular calcification. No evidence of mitral valve regurgitation. Tricuspid Valve: The tricuspid valve is normal in structure. Tricuspid valve regurgitation is not demonstrated. Aortic Valve: The aortic valve is tricuspid. Aortic valve regurgitation is mild to moderate. Mild aortic valve sclerosis is present, with no evidence of aortic valve stenosis. Aortic valve mean gradient measures 7.0 mmHg. Aortic valve peak gradient measures 15.2 mmHg. Aortic valve area, by VTI measures 2.88 cm. Pulmonic Valve: The pulmonic valve was grossly normal. Pulmonic valve regurgitation is not visualized. Aorta: The aortic root and ascending aorta are structurally normal, with no evidence of dilitation. IAS/Shunts: No atrial level shunt detected by color flow Doppler.  LEFT VENTRICLE PLAX 2D LVIDd:         5.60 cm  Diastology LVIDs:         3.50 cm  LV e' lateral:   5.77 cm/s LV PW:         1.50 cm  LV E/e' lateral: 14.5 LV IVS:        1.70 cm  LV e' medial:    4.03 cm/s LVOT diam:     2.20 cm  LV E/e' medial:  20.8 LV SV:         106 LV SV Index:   56 LVOT Area:     3.80 cm  RIGHT VENTRICLE RV S prime:     17.00 cm/s TAPSE (M-mode): 2.4 cm LEFT ATRIUM             Index       RIGHT ATRIUM           Index LA diam:        4.40 cm 2.32 cm/m  RA Area:     17.20 cm LA Vol (A2C):   64.2 ml 33.90 ml/m RA Volume:   47.10 ml  24.87 ml/m LA Vol (A4C):   49.6 ml 26.19 ml/m LA Biplane Vol: 60.1 ml 31.74 ml/m  AORTIC VALVE AV Area (Vmax):    2.83 cm AV Area (Vmean):   2.88 cm AV Area (VTI):     2.88 cm AV Vmax:            195.00 cm/s AV Vmean:          125.000 cm/s AV VTI:            0.367 m AV Peak Grad:      15.2 mmHg AV Mean Grad:      7.0 mmHg LVOT Vmax:         145.00 cm/s LVOT Vmean:        94.600 cm/s LVOT VTI:          0.278 m LVOT/AV VTI ratio: 0.76  AORTA Ao Root diam: 4.30 cm MITRAL VALVE MV Area (PHT): 2.30 cm     SHUNTS MV Decel Time: 330 msec     Systemic VTI:  0.28 m MV E velocity: 83.70 cm/s   Systemic Diam: 2.20 cm MV A velocity: 131.00 cm/s MV E/A ratio:  0.64 Mihai Croitoru MD Electronically signed by Sanda Klein MD Signature Date/Time: 01/29/2020/2:04:24 PM    Final    CT Angio Chest/Abd/Pel for Dissection W and/or W/WO  Result Date: 01/29/2020 CLINICAL DATA:  69 year old male with a history of chest pain EXAM: CT ANGIOGRAPHY CHEST, ABDOMEN AND PELVIS TECHNIQUE: Multidetector CT imaging through the  chest, abdomen and pelvis was performed using the standard protocol during bolus administration of intravenous contrast. Multiplanar reconstructed images and MIPs were obtained and reviewed to evaluate the vascular anatomy. CONTRAST:  147mL OMNIPAQUE IOHEXOL 350 MG/ML SOLN COMPARISON:  None. FINDINGS: CTA CHEST FINDINGS Cardiovascular: Heart: Borderline enlarged heart. No pericardial fluid/thickening. Calcifications of the left anterior descending coronary artery. Aorta: No significant aortic valve calcifications. Diameter of the ascending aorta measures 4.2 cm. Three vessel arch without significant atherosclerotic changes of the branch vessels. Proximal common carotid arteries are patent. Right vertebral artery is patent. The origin of the left vertebral artery is best seen on the coronal images, beyond the inflection point of the left subclavian artery at the inlet. Artery is patent at the origin with some redundancy at the base of the neck. Intramural hematoma is present within the distal aortic arch extending from the left subclavian artery origin through the aortic arch and distal thoracic aorta below  the diaphragm. The intramural hematoma is manifested on the noncontrast CT as a hyperdense crescent sign. Thickness is estimated approximately 10 mm. Diameter of the zone 3 aorta is estimated 40 mm on image 34. Diameter of zone 4 is estimated 39 mm on image 39 of series 4. Contrast focus within the intramural hematoma in the zone 3 aorta on image 29 of series 4 compatible with penetrating ulcer in a region of calcified plaque. Diameter of the aorta at the hiatus measures 3.1 cm. Pulmonary arteries: Timing of the contrast bolus is not optimized for the pulmonary arteries. However common no central filling defects present within the proximal pulmonary arteries. Mediastinum/Nodes: No mediastinal hematoma. Small lymph nodes are present. Unremarkable thoracic inlet. Unremarkable appearance of the thoracic esophagus. Lungs/Pleura: Central airways are clear. No pleural effusion. No confluent airspace disease. No pneumothorax. CTA ABDOMEN AND PELVIS FINDINGS VASCULAR Aorta: The intramural hematoma extends from the distal thoracic aorta through the aortic hiatus, and appears to terminate just above the right renal artery in the suprarenal aorta. Diameter at the renal arteries is estimated 2.5 cm. Diameter just above the bifurcation is estimated 2.2 cm. Mild atherosclerosis of the abdominal aorta. No periaortic fluid or inflammatory changes. Celiac: Celiac artery patent without significant atherosclerosis. SMA: SMA patent without significant atherosclerosis. Renals: - Right: Main right renal artery which is patent and mild atherosclerosis. There is a very small accessory right renal artery to the lower pole segment, of which the origin is not identified (with the artery visualized on image 116 of series 4). - Left: The main left renal artery is patent without significant atherosclerosis. There is accessory left renal artery from the 4 o'clock position, just below the main left renal artery. IMA: Inferior mesenteric artery is  patent. Right lower extremity: Unremarkable caliber, and contour of the right iliac system. No aneurysm, dissection, or occlusion. Mild atherosclerosis with mild tortuosity. Hypogastric artery is patent. Common femoral artery patent, with high bifurcation. Proximal SFA and profunda femoris patent. Left lower extremity: Unremarkable caliber, and contour of the left iliac system. No aneurysm, dissection, or occlusion. Mild atherosclerosis of the left iliac system with mild tortuosity. Hypogastric artery is patent. Common femoral artery patent. Proximal SFA and profunda femoris patent. Veins: Unremarkable appearance of the venous system. Review of the MIP images confirms the above findings. NON-VASCULAR Hepatobiliary: Low-density/cystic structure within the superior left liver, segment 4 a, 3.0 cm and most likely a benign cyst. No comparison available. Otherwise unremarkable liver. Unremarkable gall bladder. Pancreas: Unremarkable. Spleen: Unremarkable. Adrenals/Urinary Tract: - Right adrenal  gland: Unremarkable - Left adrenal gland: Unremarkable. - Right kidney: No hydronephrosis, nephrolithiasis, inflammation, or ureteral dilation. Low-density lesion on the lateral cortex of the right kidney, most likely benign Bosniak 1 cyst. - Left Kidney: No hydronephrosis, nephrolithiasis, inflammation, or ureteral dilation. No focal lesion. - Urinary Bladder: Unremarkable. Stomach/Bowel: - Stomach: Unremarkable. - Small bowel: Unremarkable - Appendix: Appendix is not visualized, however, no inflammatory changes are present adjacent to the cecum to indicate an appendicitis. - Colon: Colonic diverticular disease. No focal inflammatory changes. Lymphatic: No adenopathy. Mesenteric: No free fluid or air. No mesenteric adenopathy. Reproductive: Transverse diameter of the prostate measures 41 mm. Other: The right aspect of the lower urinary bladder encroaches upon a fat containing right inguinal hernia. Musculoskeletal: Degenerative  changes of the lower cervical spine which is visualized. Degenerative changes of the thoracic and lumbar spine. The greatest a gray of narrowing in the thoracic region is at the level of T4 where there is facet spurring posteriorly, narrowing the canal to 7 mm. Degenerative changes throughout the lumbar spine contributes to endplate changes, Schmorl's nodes, anterior osteophyte production. Vacuum disc phenomenon present at L2-L3, L3-L4, L4-L5. Degenerative changes of the hips. There is a lucent/hypodense focus within the central portion of the L4 vertebral body. No other lucent foci identified. No acute fracture identified. IMPRESSION: CT angiogram is positive for acute aortic syndrome, with presence of both penetrating ulcer/entry tear in the aortic arch beyond left subclavian artery origin (zone 3), and intramural hematoma extending from the origin of the left subclavian artery through the distal thoracic aorta and suprarenal abdominal aorta. Intramural hematoma appears to terminate just above the right renal artery. The above preliminary results were discussed by telephone at the time of interpretation on 01/29/2020 at 10:05 am with Dr. Aletta Edouard. Aortic atherosclerosis and associated coronary artery disease. Mild iliac arterial disease. Aortic Atherosclerosis (ICD10-I70.0). Ascending aorta measures 42 mm. Recommend annual imaging followup by CTA or MRA. This recommendation follows 2010 ACCF/AHA/AATS/ACR/ASA/SCA/SCAI/SIR/STS/SVM Guidelines for the Diagnosis and Management of Patients with Thoracic Aortic Disease. Circulation. 2010; 121: K599-H741 There is a single lucent/hypodense lesion of the L4 vertebral body, with no comparison studies. While metastatic disease is unlikely given the single lesion, this cannot be excluded and correlation with any known history of malignancy may be useful. Additional ancillary findings as above. Signed, Dulcy Fanny. Dellia Nims, RPVI [facility] Electronically Signed   By: Corrie Mckusick D.O.   On: 01/29/2020 11:45    Assessment: 69 year old male presenting with acute thoracic aortic dissection and waxing/waning LLE weakness with sensory deficit. Neurology was consulted to assess for possible spinal cord infarction not visible on MRI of thoracic spine.  1. Exam reveals 4/5 LLE weakness with hyperreflexia, positive Babinski sign and slight hyperesthesia to temperature. RLE strength and sensation are normal. 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  2. Given that strength and sensation in the LLE are mostly preserved, the lesion inferred from the exam is most likely too small to be visualizable on repeat MRI scan.  3. Risks of endovascular assessment with catheter based angiography of the spinal arteries are felt to significantly outweigh the potential benefit of the diagnostic information obtained.  4. Anticoagulation is not an option in this case.   Recommendations: 1. Continue with management of thoracic aortic aneurysm, per Vascular Surgery team.  2. Agree with ASA 3.  Agree with target blood pressure goal to systolic pressure of 626-948.  4. PT as tolerated 5. If decision to repeat MRI of the thoracic spine under sedation is made, please call Neurology to review images after the study is obtained.    Electronically signed: Dr. Kerney Elbe 01/30/2020, 12:41 AM

## 2020-01-30 NOTE — Progress Notes (Signed)
eLink Physician-Brief Progress Note Patient Name: Scott [name] DOB: [birth year] MRN: [id number]   Date of Service  01/30/2020  HPI/Events of Note  Hypertenison - SBP goal 140-150.   eICU Interventions  Plan: 1. Labetalol 20 mg IV Q 2 hours PRN SBP > 150. Hold dose for HR < 60.      Intervention Category Major Interventions: Hypertension - evaluation and management  Tyneshia Stivers,Alekxander Eugene 01/30/2020, 11:08 PM

## 2020-01-30 NOTE — Plan of Care (Signed)
Stable during shift. BP goal 140-150, cleviprex weaned off , NTG gtt titrated (ranged from 80 mcg/min to 10 mcg/min - see MAR). Labetalol prn x 1. Sinus Brady 50-60s.   Intermittent pain in chest - radiates through back, tx with fentanyl IVP x 1, tylenol po x 2, back rub and hot packs. Pain improved over shift.   LLE weakness/tingling off and on. MRI of thoracic spine done per vascular, neurology consulted and in to assess pt.    Education done with pt on importance of BP control.   Problem: Education: Goal: Knowledge of General Education information will improve Description: Including pain rating scale, medication(s)/side effects and non-pharmacologic comfort measures Outcome: Progressing   Problem: Health Behavior/Discharge Planning: Goal: Ability to manage health-related needs will improve Outcome: Progressing   Problem: Clinical Measurements: Goal: Ability to maintain clinical measurements within normal limits will improve Outcome: Progressing Goal: Will remain free from infection Outcome: Progressing Goal: Diagnostic test results will improve Outcome: Progressing Goal: Respiratory complications will improve Outcome: Progressing Goal: Cardiovascular complication will be avoided Outcome: Progressing   Problem: Activity: Goal: Risk for activity intolerance will decrease Outcome: Progressing   Problem: Nutrition: Goal: Adequate nutrition will be maintained Outcome: Progressing   Problem: Coping: Goal: Level of anxiety will decrease Outcome: Progressing   Problem: Elimination: Goal: Will not experience complications related to bowel motility Outcome: Progressing Goal: Will not experience complications related to urinary retention Outcome: Progressing   Problem: Pain Managment: Goal: General experience of comfort will improve Outcome: Progressing   Problem: Safety: Goal: Ability to remain free from injury will improve Outcome: Progressing   Problem: Skin  Integrity: Goal: Risk for impaired skin integrity will decrease Outcome: Progressing

## 2020-01-30 NOTE — Progress Notes (Signed)
NAME:  Scott [name], MRN:  [id number], DOB:  Nov 08, 1950, LOS: 1 ADMISSION DATE:  01/29/2020, CONSULTATION DATE: 01/29/2020 REFERRING MD: Dr. Melina Copa, CHIEF COMPLAINT: Dissecting abdominal aortic aneurysm  Brief History   69 year old male presents with complaints of 5 out of 10 mid back pain with associated bilateral leg weakness found to have small dissecting abdominal aortic aneurysm on ED evaluation.  Vascular surgery consulted at [facility] who recommended transfer to [facility].  PCCM consulted for further management and admission  History of present illness   Scott [name] is a 69 year old male with past medical history significant for hypertension who presented to the emergency department with 5 out of 10 mid back pain.  Patient reported back pain began around 5 AM and is also associated with bilateral leg weakness that is progressively worsening.  Patient denies any chest pain, abdominal pain, or bowel or bladder incontinence.  On arrival to emergency department patient was seen mildly diaphoretic with continued back pain and lower extremity weakness.  Vital signs significant hypertensive urgency with a blood pressure of 208/96.  Lab work significant for mild hyperglycemia and mild hypokalemia.  EKG with sinus rhythm and multiple PVCs.  CT angiogram chest/abdomen/pelvis obtained on arrival that revealed small dissecting abdominal aortic aneurysm.  At this time vascular surgery was consulted by [facility] emergency department physician who recommended transfer to [facility] for further evaluation.  Vascular surgery recommended critical care admit.  Of note patient reports he recently ran out of his antihypertensives and when he called his PCP for refills he was requested to present for follow up but he failed to do so.    Past Medical History  Hypertension  Significant Hospital Events   Admitted 8/10  Consults:  Vascular surgery  Procedures:  None  Significant Diagnostic Tests:  CT angio  chest/abdomen/pelvis 8/10 > Acute aortic syndrome with presence both penetrating ulcer/entry tear in the aortic arch beyond left subclavian origin   MRI spine 8/10 >  1. Degraded by motion despite repeated imaging attempts. 2. 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 Suboptimal cord detail elsewhere, although no levels of cord swelling to suggest ischemic cytotoxic edema. 3. Occasional mild thoracic spinal stenosis related to spine degeneration and/or epidural lipomatosis. Up to moderate degenerative neural foraminal stenosis at the right T3 and left T10 nerve levels.   Micro Data:  COVID 8/10 > Negative   Antimicrobials:  None  Interim history/subjective:  Lying in bed in no acute distress.  States lower extremity weakness improved compared to overnight.  Denies any acute complaints  Objective   Blood pressure (!) 157/73, pulse 82, temperature 98 F (36.7 C), temperature source Oral, resp. rate 18, height 5' 8.5" (1.74 m), weight 83.9 kg, SpO2 99 %.        Intake/Output Summary (Last 24 hours) at 01/30/2020 0736 Last data filed at 01/30/2020 0700 Gross per 24 hour  Intake 832.86 ml  Output 975 ml  Net -142.14 ml   Filed Weights   01/30/20 0545  Weight: 83.9 kg    Examination: General: Pleasant adult male lying in bed in no acute distress HEENT: [place]/AT, MM pink/moist, PERRL,  Neuro: Alert and oriented x3, nonfocal, lower extremity strength 5/5 CV: s1s2 regular rate and rhythm, no murmur, rubs, or gallops,  PULM: Clear to auscultation bilaterally, no increased work of breathing GI: soft, bowel sounds active in all 4 quadrants, non-tender, non-distended,  Extremities: warm/dry, no edema  Skin: no rashes or lesions   Resolved Hospital Problem list     Assessment & Plan:  Dissecting abdominal aortic aneurysm -Patient presented with 5 out of 10 mid thoracic back pain with associated bilateral  progressive leg weakness -CTA chest/abdomen/pelvis positive for dissecting aortic aneurysm and on ED arrival P: Vascular surgery following  Strict blood pressure control Continue IV nitro drip, wean as able Begin p.o. Norvasc Close monitoring in the ICU Ensure adequate pain control  Frequent neruovascular checks  Will need to repeat CT in 48 hours per vascular  Hypertensive urgency  -Patient has a history of HTN medications reconciliation is pending but it appears patient is currently on no chronic antihypertensives  -Blood pressure was 207/109 on admission P: SBP goal 140 Continue IV antihypertensive regiment as above Continuous telemetry  Close monitoring of hemodynamics   Best practice:  Diet: NPO Pain/Anxiety/Delirium protocol (if indicated): PRNs VAP protocol (if indicated): N/A DVT prophylaxis: SCD GI prophylaxis: PPI Glucose control: SSI Mobility: Bedrest Code Status: Full Family Communication: Will update on arrival  Disposition: ICU  Labs   CBC: Recent Labs  Lab 01/29/20 0810 01/30/20 0121  WBC 8.7 13.0*  HGB 15.4 13.4  HCT 45.7 39.4  MCV 99.6 99.0  PLT 206 017    Basic Metabolic Panel: Recent Labs  Lab 01/29/20 0810 01/30/20 0121  NA 140 138  K 3.1* 4.6  CL 105 106  CO2 20* 22  GLUCOSE 138* 141*  BUN 22 18  CREATININE 1.05 1.00  CALCIUM 8.9 8.7*  MG  --  1.9  PHOS  --  2.8   GFR: Estimated Creatinine Clearance: 72.2 mL/min (by C-G formula based on SCr of 1 mg/dL). Recent Labs  Lab 01/29/20 0810 01/30/20 0121  WBC 8.7 13.0*    Liver Function Tests: No results for input(s): AST, ALT, ALKPHOS, BILITOT, PROT, ALBUMIN in the last 168 hours. No results for input(s): LIPASE, AMYLASE in the last 168 hours. No results for input(s): AMMONIA in the last 168 hours.  ABG No results found for: PHART, PCO2ART, PO2ART, HCO3, TCO2, ACIDBASEDEF, O2SAT   Coagulation Profile: Recent Labs  Lab 01/29/20 1455  INR 1.1    Cardiac Enzymes: No  results for input(s): CKTOTAL, CKMB, CKMBINDEX, TROPONINI in the last 168 hours.  HbA1C: Hgb A1c MFr Bld  Date/Time Value Ref Range Status  01/30/2020 01:21 AM 5.6 4.8 - 5.6 % Final    Comment:    (NOTE) Pre diabetes:          5.7%-6.4%  Diabetes:              >6.4%  Glycemic control for   <7.0% adults with diabetes     CBG: Recent Labs  Lab 01/29/20 0645  GLUCAP 143*   Critical care time:    Performed by: Johnsie Cancel  Total critical care time: 35 minutes  Critical care time was exclusive of separately billable procedures and treating other patients.  Critical care was necessary to treat or prevent imminent or life-threatening deterioration.  Critical care was time spent personally by me on the following activities: development of treatment plan with patient and/or surrogate as well as nursing, discussions with consultants, evaluation of patient's response to treatment, examination of patient, obtaining history from patient or surrogate, ordering and performing treatments and interventions, ordering and review of laboratory studies, ordering and review of radiographic studies, pulse oximetry and re-evaluation of patient's condition.  Johnsie Cancel, NP-C [facility] Contact / Pager  information can be found on Amion  01/30/2020, 7:36 AM

## 2020-01-30 NOTE — Progress Notes (Signed)
Approx [id number]  To radiology for follow up CT chest/abd/pelvis.

## 2020-01-31 DIAGNOSIS — I7103 Dissection of thoracoabdominal aorta: Secondary | ICD-10-CM

## 2020-01-31 DIAGNOSIS — I161 Hypertensive emergency: Secondary | ICD-10-CM | POA: Diagnosis not present

## 2020-01-31 LAB — BASIC METABOLIC PANEL
Anion gap: 8 (ref 5–15)
BUN: 18 mg/dL (ref 8–23)
CO2: 26 mmol/L (ref 22–32)
Calcium: 9 mg/dL (ref 8.9–10.3)
Chloride: 103 mmol/L (ref 98–111)
Creatinine, Ser: 1.06 mg/dL (ref 0.61–1.24)
GFR calc Af Amer: >60 mL/min (ref >60)
GFR calc non Af Amer: >60 mL/min (ref >60)
Glucose, Bld: 124 mg/dL — ABNORMAL HIGH (ref 70–99)
Potassium: 4.4 mmol/L (ref 3.5–5.1)
Sodium: 137 mmol/L (ref 135–145)

## 2020-01-31 LAB — CBC WITH DIFFERENTIAL/PLATELET
Abs Immature Granulocytes: 0.06 10*3/uL (ref 0.00–0.07)
Basophils Absolute: 0 10*3/uL (ref 0.0–0.1)
Basophils Relative: 0 %
Eosinophils Absolute: 0 10*3/uL (ref 0.0–0.5)
Eosinophils Relative: 0 %
HCT: 37.7 % — ABNORMAL LOW (ref 39.0–52.0)
Hemoglobin: 12.5 g/dL — ABNORMAL LOW (ref 13.0–17.0)
Immature Granulocytes: 1 %
Lymphocytes Relative: 13 %
Lymphs Abs: 1.5 10*3/uL (ref 0.7–4.0)
MCH: 32.8 pg (ref 26.0–34.0)
MCHC: 33.2 g/dL (ref 30.0–36.0)
MCV: 99 fL (ref 80.0–100.0)
Monocytes Absolute: 1.4 10*3/uL — ABNORMAL HIGH (ref 0.1–1.0)
Monocytes Relative: 12 %
Neutro Abs: 9.1 10*3/uL — ABNORMAL HIGH (ref 1.7–7.7)
Neutrophils Relative %: 74 %
Platelets: 146 10*3/uL — ABNORMAL LOW (ref 150–400)
RBC: 3.81 MIL/uL — ABNORMAL LOW (ref 4.22–5.81)
RDW: 13 % (ref 11.5–15.5)
WBC: 12.1 10*3/uL — ABNORMAL HIGH (ref 4.0–10.5)
nRBC: 0 % (ref 0.0–0.2)

## 2020-01-31 MED ORDER — HYDRALAZINE HCL 50 MG PO TABS
50.0000 mg | ORAL_TABLET | Freq: Three times a day (TID) | ORAL | Status: DC
  Administered 2020-01-31 – 2020-02-01 (×4): 50 mg via ORAL
  Filled 2020-01-31 (×4): qty 1

## 2020-01-31 MED ORDER — BISACODYL 10 MG RE SUPP: 10.0000 mg | Freq: Every day | RECTAL | Status: DC | PRN

## 2020-01-31 MED ORDER — CLEVIDIPINE BUTYRATE 0.5 MG/ML IV EMUL
0.0000 mg/h | INTRAVENOUS | Status: DC
  Administered 2020-02-02: 2 mg/h via INTRAVENOUS
  Administered 2020-02-02: 8 mg/h via INTRAVENOUS
  Administered 2020-02-03: 14 mg/h via INTRAVENOUS
  Administered 2020-02-03: 11 mg/h via INTRAVENOUS
  Administered 2020-02-03 (×2): 12 mg/h via INTRAVENOUS
  Administered 2020-02-03: 13 mg/h via INTRAVENOUS
  Administered 2020-02-03: 10 mg/h via INTRAVENOUS
  Administered 2020-02-03: 13 mg/h via INTRAVENOUS
  Filled 2020-01-31 (×5): qty 100
  Filled 2020-01-31: qty 50
  Filled 2020-01-31: qty 100
  Filled 2020-01-31: qty 50
  Filled 2020-01-31: qty 100
  Filled 2020-01-31 (×3): qty 50

## 2020-01-31 MED ORDER — POLYETHYLENE GLYCOL 3350 17 G PO PACK
17.0000 g | PACK | Freq: Every day | ORAL | Status: DC
  Administered 2020-01-31 – 2020-02-06 (×7): 17 g via ORAL
  Filled 2020-01-31 (×7): qty 1

## 2020-01-31 NOTE — Progress Notes (Signed)
eLink Physician-Brief Progress Note Patient Name: Jackey Housey DOB: 1951-04-26 MRN: [id number]   Date of Service  01/31/2020  HPI/Events of Note  Nursing request for AM labs.   eICU Interventions  Will order CBC with platelets and BMP at 5 AM.      Intervention Category Major Interventions: Other:  Branch, Pacitti 01/31/2020, 4:04 AM

## 2020-01-31 NOTE — Plan of Care (Signed)
Stable during shift.   Repeat chest CT done, per vascular, evolving aneurysm. Monitoring LLE closely d/t compromised L iliac artery, LLE warm, + movement/sensation, pulses dopplerable. Requiring NTG gtt and prn BP meds to maintain SBP goal 140-150. PRN pain meds for pain lower back, progressed to lower abdomen.   Problem: Education: Goal: Knowledge of General Education information will improve Description: Including pain rating scale, medication(s)/side effects and non-pharmacologic comfort measures Outcome: Progressing   Problem: Health Behavior/Discharge Planning: Goal: Ability to manage health-related needs will improve Outcome: Progressing   Problem: Clinical Measurements: Goal: Ability to maintain clinical measurements within normal limits will improve Outcome: Progressing Goal: Will remain free from infection Outcome: Progressing Goal: Diagnostic test results will improve Outcome: Progressing Goal: Respiratory complications will improve Outcome: Progressing Goal: Cardiovascular complication will be avoided Outcome: Progressing   Problem: Activity: Goal: Risk for activity intolerance will decrease Outcome: Progressing   Problem: Nutrition: Goal: Adequate nutrition will be maintained Outcome: Progressing   Problem: Coping: Goal: Level of anxiety will decrease Outcome: Progressing   Problem: Elimination: Goal: Will not experience complications related to bowel motility Outcome: Progressing Goal: Will not experience complications related to urinary retention Outcome: Progressing   Problem: Pain Managment: Goal: General experience of comfort will improve Outcome: Progressing   Problem: Safety: Goal: Ability to remain free from injury will improve Outcome: Progressing   Problem: Skin Integrity: Goal: Risk for impaired skin integrity will decrease Outcome: Progressing

## 2020-01-31 NOTE — Progress Notes (Signed)
NAME:  Scott [name], MRN:  [id number], DOB:  1951-05-12, LOS: 2 ADMISSION DATE:  01/29/2020, CONSULTATION DATE: 01/29/2020 REFERRING MD: Dr. Melina Copa, CHIEF COMPLAINT: Dissecting abdominal aortic aneurysm  Brief History   69 year old male presents with complaints of 5 out of 10 mid back pain with associated bilateral leg weakness found to have small dissecting abdominal aortic aneurysm on ED evaluation.  Vascular surgery consulted at [facility] who recommended transfer to [name].  PCCM consulted for further management and admission  History of present illness   Scott [name] is a 69 year old male with past medical history significant for hypertension who presented to the emergency department with 5 out of 10 mid back pain.  Patient reported back pain began around 5 AM and is also associated with bilateral leg weakness that is progressively worsening.  Patient denies any chest pain, abdominal pain, or bowel or bladder incontinence.  On arrival to emergency department patient was seen mildly diaphoretic with continued back pain and lower extremity weakness.  Vital signs significant hypertensive urgency with a blood pressure of 208/96.  Lab work significant for mild hyperglycemia and mild hypokalemia.  EKG with sinus rhythm and multiple PVCs.  CT angiogram chest/abdomen/pelvis obtained on arrival that revealed small dissecting abdominal aortic aneurysm.  At this time vascular surgery was consulted by [facility] emergency department physician who recommended transfer to [facility] for further evaluation.  Vascular surgery recommended critical care admit.  Of note patient reports he recently ran out of his antihypertensives and when he called his PCP for refills he was requested to present for follow up but he failed to do so.    Past Medical History  Hypertension  Significant Hospital Events   Admitted 8/10  Consults:  Vascular surgery  Procedures:  None  Significant Diagnostic Tests:  CT angio  chest/abdomen/pelvis 8/10 > Acute aortic syndrome with presence both penetrating ulcer/entry tear in the aortic arch beyond left subclavian origin   MRI spine 8/10 >  1. Degraded by motion despite repeated imaging attempts. 2. 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 Suboptimal cord detail elsewhere, although no levels of cord swelling to suggest ischemic cytotoxic edema. 3. Occasional mild thoracic spinal stenosis related to spine degeneration and/or epidural lipomatosis. Up to moderate degenerative neural foraminal stenosis at the right T3 and left T10 nerve levels.   Micro Data:  COVID 8/10 > Negative   Antimicrobials:  None  Interim history/subjective:  Patient began experiencing worsening of lower abdominal/back pain evening of 8/11 at which time he received repeat CT scan that revealed evolution of dissection.    Objective   Blood pressure (!) 166/79, pulse 63, temperature 98.3 F (36.8 C), temperature source Oral, resp. rate 19, height 5' 8.5" (1.74 m), weight 83.9 kg, SpO2 94 %.        Intake/Output Summary (Last 24 hours) at 01/31/2020 0844 Last data filed at 01/31/2020 0800 Gross per 24 hour  Intake 1055.59 ml  Output 750 ml  Net 305.59 ml   Filed Weights   01/30/20 0545 01/31/20 0610  Weight: 83.9 kg 83.9 kg    Examination: General: Pleasant elderly gentleman lying in bed in no acute distress  HEENT: [place]/AT, MM pink/moist, PERRL,  Neuro: Alert and oriented x3, nonfocal, 4/5 weakness in the left lower extremity, 5/5 right lower extremity, sensation bilaterally CV: Slight bradycardia s1s2 regular rate and rhythm, no murmur, rubs, or gallops,  PULM: Clear to auscultation bilaterally,  no increased work of breathing, no added breath sounds GI: soft, bowel sounds active in all 4 quadrants, non-tender, non-distended Extremities: warm/dry, no edema  Skin: no rashes or lesions  Resolved Hospital  Problem list     Assessment & Plan:  Dissecting abdominal aortic aneurysm -Patient presented with 5 out of 10 mid thoracic back pain with associated bilateral progressive leg weakness -CTA chest/abdomen/pelvis positive for dissecting aortic aneurysm and on ED arrival P: Vascular surgery continues to follow Strict blood pressure control with SBP goal 1 40-1 50 Continue nitro drip and resume IV Cleviprex drip to maintain SBP less than 150 Continue p.o. Norvasc and labetalol Will begin p.o. hydralazine Ensure adequate pain control Frequent neurovascular checks  Hypertensive urgency  -Patient has a history of HTN medications reconciliation is pending but it appears patient is currently on no chronic antihypertensives  -Blood pressure was 207/109 on admission P: SBP goal 1 40-1 50 Continue IV and oral antihypertensives Continuous telemetry Close monitoring of hemodynamics  Constipation -Patient reported issues with constipation evening of 8/11 P: Begin bowel regiment Educated patient on importance of not straining to evacuate bowels  Best practice:  Diet: NPO Pain/Anxiety/Delirium protocol (if indicated): PRNs VAP protocol (if indicated): N/A DVT prophylaxis: SCD GI prophylaxis: PPI Glucose control: SSI Mobility: Bedrest Code Status: Full Family Communication: Will update on arrival  Disposition: ICU  Labs   CBC: Recent Labs  Lab 01/29/20 0810 01/30/20 0121 01/31/20 0604  WBC 8.7 13.0* 12.1*  NEUTROABS  --   --  9.1*  HGB 15.4 13.4 12.5*  HCT 45.7 39.4 37.7*  MCV 99.6 99.0 99.0  PLT 206 184 146*    Basic Metabolic Panel: Recent Labs  Lab 01/29/20 0810 01/30/20 0121 01/31/20 0604  NA 140 138 137  K 3.1* 4.6 4.4  CL 105 106 103  CO2 20* 22 26  GLUCOSE 138* 141* 124*  BUN 22 18 18   CREATININE 1.05 1.00 1.06  CALCIUM 8.9 8.7* 9.0  MG  --  1.9  --   PHOS  --  2.8  --    GFR: Estimated Creatinine Clearance: 68.1 mL/min (by C-G formula based on SCr of  1.06 mg/dL). Recent Labs  Lab 01/29/20 0810 01/30/20 0121 01/31/20 0604  WBC 8.7 13.0* 12.1*    Liver Function Tests: No results for input(s): AST, ALT, ALKPHOS, BILITOT, PROT, ALBUMIN in the last 168 hours. No results for input(s): LIPASE, AMYLASE in the last 168 hours. No results for input(s): AMMONIA in the last 168 hours.  ABG No results found for: PHART, PCO2ART, PO2ART, HCO3, TCO2, ACIDBASEDEF, O2SAT   Coagulation Profile: Recent Labs  Lab 01/29/20 1455  INR 1.1    Cardiac Enzymes: No results for input(s): CKTOTAL, CKMB, CKMBINDEX, TROPONINI in the last 168 hours.  HbA1C: Hgb A1c MFr Bld  Date/Time Value Ref Range Status  01/30/2020 01:21 AM 5.6 4.8 - 5.6 % Final    Comment:    (NOTE) Pre diabetes:          5.7%-6.4%  Diabetes:              >6.4%  Glycemic control for   <7.0% adults with diabetes     CBG: Recent Labs  Lab 01/29/20 0645  GLUCAP 143*   Critical care time:    Performed by: Johnsie Cancel  Total critical care time: 39 minutes  Critical care time was exclusive of separately billable procedures and treating other patients.  Critical care was necessary to treat or prevent  imminent or life-threatening deterioration.  Critical care was time spent personally by me on the following activities: development of treatment plan with patient and/or surrogate as well as nursing, discussions with consultants, evaluation of patient's response to treatment, examination of patient, obtaining history from patient or surrogate, ordering and performing treatments and interventions, ordering and review of laboratory studies, ordering and review of radiographic studies, pulse oximetry and re-evaluation of patient's condition.  Johnsie Cancel, NP-C [facility] Contact / Pager information can be found on Amion  01/31/2020, 8:44 AM

## 2020-01-31 NOTE — Plan of Care (Signed)
  Problem: Clinical Measurements: Goal: Ability to maintain clinical measurements within normal limits will improve Outcome: Not Progressing Goal: Will remain free from infection Outcome: Adequate for Discharge Goal: Diagnostic test results will improve Outcome: Not Progressing Goal: Respiratory complications will improve Outcome: Adequate for Discharge Goal: Cardiovascular complication will be avoided Outcome: Progressing   Problem: Activity: Goal: Risk for activity intolerance will decrease Outcome: Not Progressing   Problem: Nutrition: Goal: Adequate nutrition will be maintained Outcome: Not Progressing   Problem: Coping: Goal: Level of anxiety will decrease Outcome: Not Progressing   Problem: Elimination: Goal: Will not experience complications related to bowel motility Outcome: Not Progressing Goal: Will not experience complications related to urinary retention Outcome: Not Progressing   Problem: Pain Managment: Goal: General experience of comfort will improve Outcome: Progressing   Problem: Safety: Goal: Ability to remain free from injury will improve Outcome: Adequate for Discharge   Problem: Skin Integrity: Goal: Risk for impaired skin integrity will decrease Outcome: Adequate for Discharge

## 2020-01-31 NOTE — Progress Notes (Addendum)
[facility]  Subjective  - Pain in lower abdomin this am, controled with PO pain medication.   Objective (!) 166/79 63 98.3 F (36.8 C) (Oral) 19 94%  Intake/Output Summary (Last 24 hours) at 01/31/2020 0806 Last data filed at 01/31/2020 0700 Gross per 24 hour  Intake 1067.04 ml  Output 725 ml  Net 342.04 ml    Palpable radial and DP pulses B Lungs non labored breathing Heart RRR, BP elevated to 546 systolic this am A & O x 3  Assessment/Planning: Stable.  Evolving changes from his aortic dissection.  Palpable pulses all 4 ext.  Lower abdominal discomfort this am, controled with PO pain medication. Cont medical management with BP control systolic< 503 is the goal. IV Nitro @ 40 mcg/min with supported BB and Norvasc. Possible stent graft intervention per Dr. Trula Slade and patient with further discussion.  Roxy Horseman 01/31/2020 8:06 AM --  Laboratory Lab Results: Recent Labs    01/30/20 0121 01/31/20 0604  WBC 13.0* 12.1*  HGB 13.4 12.5*  HCT 39.4 37.7*  PLT 184 146*   BMET Recent Labs    01/30/20 0121 01/31/20 0604  NA 138 137  K 4.6 4.4  CL 106 103  CO2 22 26  GLUCOSE 141* 124*  BUN 18 18  CREATININE 1.00 1.06  CALCIUM 8.7* 9.0    COAG Lab Results  Component Value Date   INR 1.1 01/29/2020   No results found for: PTT  I agree with the above.  I have seen and evaluated the patient.  Repeat CT scan last night shows a new appearance of his dissection.  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  The aortic ulcer is less prominent.  However he does have a new dissection beginning in the abdomen.  This goes down into the left common and external iliac artery with significant stenosis.  The patient does not endorse any leg complaints at this time.  He no longer has numbness.  I can palpate his pedal pulses however they are diminished when compared to the right.  Because of these new  findings, the patient may require early surgical intervention.  At this time, I will continue to monitor him with blood pressure and pain control.  I plan on repeating his CT scan over the weekend.  As long as he does not have any further numbness in his leg, we can wait until a repeat CT scan.  If he does have return of symptoms, he may require endovascular repair of the thoracic component and stenting of his left iliac system.  Annamarie Major

## 2020-02-01 DIAGNOSIS — I7103 Dissection of thoracoabdominal aorta: Secondary | ICD-10-CM | POA: Diagnosis not present

## 2020-02-01 DIAGNOSIS — I161 Hypertensive emergency: Secondary | ICD-10-CM | POA: Diagnosis not present

## 2020-02-01 LAB — CBC
HCT: 40.5 % (ref 39.0–52.0)
Hemoglobin: 13.4 g/dL (ref 13.0–17.0)
MCH: 32.6 pg (ref 26.0–34.0)
MCHC: 33.1 g/dL (ref 30.0–36.0)
MCV: 98.5 fL (ref 80.0–100.0)
Platelets: 161 10*3/uL (ref 150–400)
RBC: 4.11 MIL/uL — ABNORMAL LOW (ref 4.22–5.81)
RDW: 13 % (ref 11.5–15.5)
WBC: 11.1 10*3/uL — ABNORMAL HIGH (ref 4.0–10.5)
nRBC: 0 % (ref 0.0–0.2)

## 2020-02-01 LAB — BASIC METABOLIC PANEL
Anion gap: 11 (ref 5–15)
BUN: 22 mg/dL (ref 8–23)
CO2: 23 mmol/L (ref 22–32)
Calcium: 9.1 mg/dL (ref 8.9–10.3)
Chloride: 101 mmol/L (ref 98–111)
Creatinine, Ser: 1.12 mg/dL (ref 0.61–1.24)
GFR calc Af Amer: >60 mL/min (ref >60)
GFR calc non Af Amer: >60 mL/min (ref >60)
Glucose, Bld: 126 mg/dL — ABNORMAL HIGH (ref 70–99)
Potassium: 4.1 mmol/L (ref 3.5–5.1)
Sodium: 135 mmol/L (ref 135–145)

## 2020-02-01 MED ORDER — ENSURE ENLIVE PO LIQD
237.0000 mL | Freq: Two times a day (BID) | ORAL | Status: DC
  Administered 2020-02-01 – 2020-02-06 (×6): 237 mL via ORAL

## 2020-02-01 MED ORDER — HYDRALAZINE HCL 50 MG PO TABS
75.0000 mg | ORAL_TABLET | Freq: Three times a day (TID) | ORAL | Status: DC
  Administered 2020-02-01 – 2020-02-04 (×7): 75 mg via ORAL
  Filled 2020-02-01 (×7): qty 1

## 2020-02-01 MED FILL — Labetalol HCl IV Soln Prefilled Syringe 20 MG/4ML (5 MG/ML): INTRAVENOUS | Qty: 4 | Status: AC

## 2020-02-01 MED FILL — Nicardipine HCl IV Soln 20 MG/200ML in Sodium Chloride 0.9%: INTRAVENOUS | Qty: 200 | Status: AC

## 2020-02-01 MED FILL — Fentanyl Citrate Preservative Free (PF) Inj 100 MCG/2ML: INTRAMUSCULAR | Qty: 2 | Status: AC

## 2020-02-01 MED FILL — Nitroglycerin IV Soln 200 MCG/ML in D5W: INTRAVENOUS | Qty: 250 | Status: AC

## 2020-02-01 NOTE — Progress Notes (Addendum)
Progress Note    02/01/2020 7:15 AM * No surgery found *  Subjective:  My first examination of patient. Night shift RN at bedside.  He has had only intermittent low back and low abd pain overnight that he says is improved as compared to night before.  He did receive labetalol and hydralazine for BP control. He ambulated for short distance yesterday and denies LE pain or numbness,   Vitals:   02/01/20 0630 02/01/20 0700  BP: 139/72 (!) 143/65  Pulse: 67 60  Resp: 19 20  Temp:    SpO2: 95% 95%    Physical Exam: Cardiac:  RRR Lungs:  CTAB Extremities:  Both feet warm with intact motor function and sensation. 2-3+ right DP pulse. Brisk left DP Doppler signal. 2+ radial pulses bilaterally Abdomen:  Soft, ND, NT  CBC    Component Value Date/Time   WBC 12.1 (H) 01/31/2020 0604   RBC 3.81 (L) 01/31/2020 0604   HGB 12.5 (L) 01/31/2020 0604   HCT 37.7 (L) 01/31/2020 0604   PLT 146 (L) 01/31/2020 0604   MCV 99.0 01/31/2020 0604   MCH 32.8 01/31/2020 0604   MCHC 33.2 01/31/2020 0604   RDW 13.0 01/31/2020 0604   LYMPHSABS 1.5 01/31/2020 0604   MONOABS 1.4 (H) 01/31/2020 0604   EOSABS 0.0 01/31/2020 0604   BASOSABS 0.0 01/31/2020 0604    BMET    Component Value Date/Time   NA 137 01/31/2020 0604   K 4.4 01/31/2020 0604   CL 103 01/31/2020 0604   CO2 26 01/31/2020 0604   GLUCOSE 124 (H) 01/31/2020 0604   BUN 18 01/31/2020 0604   CREATININE 1.06 01/31/2020 0604   CALCIUM 9.0 01/31/2020 0604   GFRNONAA >60 01/31/2020 0604   GFRAA >60 01/31/2020 0604     Intake/Output Summary (Last 24 hours) at 02/01/2020 0715 Last data filed at 02/01/2020 0400 Gross per 24 hour  Intake 1010.33 ml  Output 925 ml  Net 85.33 ml    HOSPITAL MEDICATIONS Scheduled Meds: . amLODipine  10 mg Oral Daily  . aspirin  324 mg Oral Once  . Chlorhexidine Gluconate Cloth  6 each Topical Daily  . famotidine  20 mg Oral BID  . hydrALAZINE  50 mg Oral Q8H  . labetalol  100 mg Oral BID  .  midazolam  1 mg Intravenous Once  . polyethylene glycol  17 g Oral Daily  . sodium chloride flush  3 mL Intravenous Q12H   Continuous Infusions: . sodium chloride    . clevidipine    . nitroGLYCERIN Stopped (01/31/20 2218)   PRN Meds:.sodium chloride, acetaminophen, bisacodyl, docusate sodium, fentaNYL (SUBLIMAZE) injection, hydrALAZINE, labetalol, morphine injection, ondansetron (ZOFRAN) IV, oxyCODONE, sodium chloride flush  Assessment: thoracic aortic dissection.  Remains on nitro gtt for BP control as well as intermittent IV meds.  Pain syndrome improved. Left DP pulse diminished. SCr 1.06  Plan: -Continue current treatment plan with plan to repeat CTA over the weekend.  He would like clarity on ambulatory status. He would like to be OOB more.  Will clarify with Dr. Einar Gip, PA-C [facility] [phone] 02/01/2020  7:15 AM    I agree with the above.  I have seen and examined the patient. His pain continues to improve.  He does not have left leg issues currently.  He has palpable pedal pulses bilaterally.  We will continue with BP control, transitioning to PO meds.  He will need referral to HTN clinic at discharge.  He aslo needs a PCP.  HE has no activity restrictions..  I will repeat his CTA on Sunday.  Annamarie Major

## 2020-02-01 NOTE — Progress Notes (Signed)
NAME:  Scott [name], MRN:  [id number], DOB:  09-25-1950, LOS: 3 ADMISSION DATE:  01/29/2020, CONSULTATION DATE: 01/29/2020 REFERRING MD: Dr. Melina Copa, CHIEF COMPLAINT: Dissecting abdominal aortic aneurysm  Brief History   69 year old male presents with complaints of 5 out of 10 mid back pain with associated bilateral leg weakness found to have small dissecting abdominal aortic aneurysm on ED evaluation.  Vascular surgery consulted at [facility] who recommended transfer to [facility].  PCCM consulted for further management and admission  History of present illness   Scott [name] is a 69 year old male with past medical history significant for hypertension who presented to the emergency department with 5 out of 10 mid back pain.  Patient reported back pain began around 5 AM and is also associated with bilateral leg weakness that is progressively worsening.  Patient denies any chest pain, abdominal pain, or bowel or bladder incontinence.  On arrival to emergency department patient was seen mildly diaphoretic with continued back pain and lower extremity weakness.  Vital signs significant hypertensive urgency with a blood pressure of 208/96.  Lab work significant for mild hyperglycemia and mild hypokalemia.  EKG with sinus rhythm and multiple PVCs.  CT angiogram chest/abdomen/pelvis obtained on arrival that revealed small dissecting abdominal aortic aneurysm.  At this time vascular surgery was consulted by [facility] emergency department physician who recommended transfer to [facility] for further evaluation.  Vascular surgery recommended critical care admit.  Of note patient reports he recently ran out of his antihypertensives and when he called his PCP for refills he was requested to present for follow up but he failed to do so.    Past Medical History  Hypertension  Significant Hospital Events   Admitted 8/10  Consults:  Vascular surgery  Procedures:  None  Significant Diagnostic Tests:  CT angio  chest/abdomen/pelvis 8/10 > Acute aortic syndrome with presence both penetrating ulcer/entry tear in the aortic arch beyond left subclavian origin   MRI spine 8/10 >  1. Degraded by motion despite repeated imaging attempts. 2. 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 Suboptimal cord detail elsewhere, although no levels of cord swelling to suggest ischemic cytotoxic edema. 3. Occasional mild thoracic spinal stenosis related to spine degeneration and/or epidural lipomatosis. Up to moderate degenerative neural foraminal stenosis at the right T3 and left T10 nerve levels.   Micro Data:  COVID 8/10 > Negative   Antimicrobials:  None  Interim history/subjective:   Complains of intermittent abdominal pain overnight Blood pressure better controlled   Objective   Blood pressure (!) 139/42, pulse 60, temperature 98.8 F (37.1 C), temperature source Oral, resp. rate 18, height 5' 8.5" (1.74 m), weight 83.2 kg, SpO2 95 %.        Intake/Output Summary (Last 24 hours) at 02/01/2020 0917 Last data filed at 02/01/2020 0800 Gross per 24 hour  Intake 1058.37 ml  Output 700 ml  Net 358.37 ml   Filed Weights   01/30/20 0545 01/31/20 0610 02/01/20 0500  Weight: 83.9 kg 83.9 kg 83.2 kg    Examination: Gen. Pleasant, obese, in no distress, normal affect ENT - no pallor,icterus, no post nasal drip, class 2-3 airway Neck: No JVD, no thyromegaly, no carotid bruits Lungs: no use of accessory muscles, no dullness to percussion, decreased without rales or rhonchi  Cardiovascular: Rhythm regular, heart sounds  normal, no murmurs or gallops, no peripheral edema Abdomen: soft and non-tender, no hepatosplenomegaly, BS normal. Musculoskeletal:  No deformities, no cyanosis or clubbing, decreased pulse left lower extremity Neuro:  alert, hyperreflexic lower extremities, left ankle clonus   Resolved Hospital Problem list      Assessment & Plan:  Dissecting abdominal aortic aneurysm -Patient presented with 5 out of 10 mid thoracic back pain with associated bilateral progressive leg weakness -CTA chest/abdomen/pelvis positive for dissecting aortic aneurysm and on ED arrival P: Vascular surgery continues to follow Fever early surgical intervention -endovascular repair and stenting of left iliac    Hypertensive urgency  -Patient has a history of HTN medications reconciliation is pending but it appears patient is currently on no chronic antihypertensives  -Blood pressure was 207/109 on admission P: SBP goal < 1 40 Off drips Continue p.o. Norvasc and labetalol P.o. hydralazine 50 3 times daily and titrate to effect  Constipation -Patient reported issues with constipation evening of 8/11 P:  bowel regimen   Best practice:  Diet: NPO Pain/Anxiety/Delirium protocol (if indicated): PRNs VAP protocol (if indicated): N/A DVT prophylaxis: SCD GI prophylaxis: PPI Glucose control: SSI Mobility: Bedrest Code Status: Full Family Communication: daughter Disposition: ICU > to SDU  Labs   CBC: Recent Labs  Lab 01/29/20 0810 01/30/20 0121 01/31/20 0604 02/01/20 0735  WBC 8.7 13.0* 12.1* 11.1*  NEUTROABS  --   --  9.1*  --   HGB 15.4 13.4 12.5* 13.4  HCT 45.7 39.4 37.7* 40.5  MCV 99.6 99.0 99.0 98.5  PLT 206 184 146* 175    Basic Metabolic Panel: Recent Labs  Lab 01/29/20 0810 01/30/20 0121 01/31/20 0604 02/01/20 0735  NA 140 138 137 135  K 3.1* 4.6 4.4 4.1  CL 105 106 103 101  CO2 20* 22 26 23   GLUCOSE 138* 141* 124* 126*  BUN 22 18 18 22   CREATININE 1.05 1.00 1.06 1.12  CALCIUM 8.9 8.7* 9.0 9.1  MG  --  1.9  --   --   PHOS  --  2.8  --   --    GFR: Estimated Creatinine Clearance: 59.6 mL/min (by C-G formula based on SCr of 1.12 mg/dL). Recent Labs  Lab 01/29/20 0810 01/30/20 0121 01/31/20 0604 02/01/20 0735  WBC 8.7 13.0* 12.1* 11.1*     Kara Mead MD. FCCP. [facility]  If no response to pager , please call [phone]    02/01/2020, 9:17 AM

## 2020-02-02 ENCOUNTER — Encounter (HOSPITAL_COMMUNITY)
Admission: EM | Disposition: A | Discharge status: Home / Self Care | Payer: Self-pay | Source: Home / Self Care | Attending: Pulmonary Disease

## 2020-02-02 ENCOUNTER — Inpatient Hospital Stay (HOSPITAL_COMMUNITY): Payer: No Typology Code available for payment source

## 2020-02-02 ENCOUNTER — Inpatient Hospital Stay (HOSPITAL_COMMUNITY): Payer: No Typology Code available for payment source | Admitting: Anesthesiology

## 2020-02-02 ENCOUNTER — Encounter: Payer: Self-pay | Admitting: Vascular Surgery

## 2020-02-02 DIAGNOSIS — I712 Thoracic aortic aneurysm, without rupture: Secondary | ICD-10-CM

## 2020-02-02 DIAGNOSIS — I161 Hypertensive emergency: Secondary | ICD-10-CM | POA: Diagnosis not present

## 2020-02-02 DIAGNOSIS — I7102 Dissection of abdominal aorta: Secondary | ICD-10-CM

## 2020-02-02 DIAGNOSIS — I714 Abdominal aortic aneurysm, without rupture: Secondary | ICD-10-CM

## 2020-02-02 HISTORY — PX: ABDOMINAL AORTIC ENDOVASCULAR STENT GRAFT: SHX5707

## 2020-02-02 HISTORY — DX: Dissection of abdominal aorta: I71.02

## 2020-02-02 HISTORY — PX: THORACIC AORTIC ENDOVASCULAR STENT GRAFT: SHX6112

## 2020-02-02 LAB — PREPARE RBC (CROSSMATCH)

## 2020-02-02 LAB — BASIC METABOLIC PANEL
Anion gap: 11 (ref 5–15)
BUN: 27 mg/dL — ABNORMAL HIGH (ref 8–23)
CO2: 21 mmol/L — ABNORMAL LOW (ref 22–32)
Calcium: 8.8 mg/dL — ABNORMAL LOW (ref 8.9–10.3)
Chloride: 100 mmol/L (ref 98–111)
Creatinine, Ser: 1.13 mg/dL (ref 0.61–1.24)
GFR calc Af Amer: >60 mL/min (ref >60)
GFR calc non Af Amer: >60 mL/min (ref >60)
Glucose, Bld: 110 mg/dL — ABNORMAL HIGH (ref 70–99)
Potassium: 4 mmol/L (ref 3.5–5.1)
Sodium: 132 mmol/L — ABNORMAL LOW (ref 135–145)

## 2020-02-02 LAB — CBC
HCT: 36.8 % — ABNORMAL LOW (ref 39.0–52.0)
HCT: 36.4 % — ABNORMAL LOW (ref 39.0–52.0)
Hemoglobin: 12.5 g/dL — ABNORMAL LOW (ref 13.0–17.0)
Hemoglobin: 12.1 g/dL — ABNORMAL LOW (ref 13.0–17.0)
MCH: 33.5 pg (ref 26.0–34.0)
MCH: 32.9 pg (ref 26.0–34.0)
MCHC: 34 g/dL (ref 30.0–36.0)
MCHC: 33.2 g/dL (ref 30.0–36.0)
MCV: 98.7 fL (ref 80.0–100.0)
MCV: 98.9 fL (ref 80.0–100.0)
Platelets: 168 10*3/uL (ref 150–400)
Platelets: 165 10*3/uL (ref 150–400)
RBC: 3.73 MIL/uL — ABNORMAL LOW (ref 4.22–5.81)
RBC: 3.68 MIL/uL — ABNORMAL LOW (ref 4.22–5.81)
RDW: 13 % (ref 11.5–15.5)
RDW: 13.1 % (ref 11.5–15.5)
WBC: 10.2 10*3/uL (ref 4.0–10.5)
WBC: 11.2 10*3/uL — ABNORMAL HIGH (ref 4.0–10.5)
nRBC: 0 % (ref 0.0–0.2)
nRBC: 0 % (ref 0.0–0.2)

## 2020-02-02 LAB — POCT ACTIVATED CLOTTING TIME: Activated Clotting Time: 263 seconds

## 2020-02-02 SURGERY — INSERTION, ENDOVASCULAR STENT GRAFT, AORTA, THORACIC
Anesthesia: General

## 2020-02-02 MED ORDER — MIDAZOLAM HCL 2 MG/2ML IJ SOLN
INTRAMUSCULAR | Status: AC
  Filled 2020-02-02: qty 2

## 2020-02-02 MED ORDER — ONDANSETRON HCL 4 MG/2ML IJ SOLN
INTRAMUSCULAR | Status: DC | PRN
  Administered 2020-02-02: 4 mg via INTRAVENOUS

## 2020-02-02 MED ORDER — PROTAMINE SULFATE 10 MG/ML IV SOLN
INTRAVENOUS | Status: DC | PRN
Start: 2020-02-02 — End: 2020-02-02
  Administered 2020-02-02: 80 mg via INTRAVENOUS

## 2020-02-02 MED ORDER — LABETALOL HCL 5 MG/ML IV SOLN
10.0000 mg | INTRAVENOUS | Status: DC | PRN
  Administered 2020-02-06: 10 mg via INTRAVENOUS
  Filled 2020-02-02: qty 4

## 2020-02-02 MED ORDER — GUAIFENESIN-DM 100-10 MG/5ML PO SYRP: 15.0000 mL | ORAL_SOLUTION | ORAL | Status: DC | PRN

## 2020-02-02 MED ORDER — FENTANYL CITRATE (PF) 250 MCG/5ML IJ SOLN
INTRAMUSCULAR | Status: DC | PRN
  Administered 2020-02-02: 50 ug via INTRAVENOUS
  Administered 2020-02-02: 25 ug via INTRAVENOUS
  Administered 2020-02-02: 50 ug via INTRAVENOUS

## 2020-02-02 MED ORDER — IOHEXOL 350 MG/ML SOLN
100.0000 mL | Freq: Once | INTRAVENOUS | Status: AC | PRN
  Administered 2020-02-02: 100 mL via INTRAVENOUS

## 2020-02-02 MED ORDER — LIDOCAINE 2% (20 MG/ML) 5 ML SYRINGE
INTRAMUSCULAR | Status: AC
  Filled 2020-02-02: qty 5

## 2020-02-02 MED ORDER — FENTANYL CITRATE (PF) 250 MCG/5ML IJ SOLN
INTRAMUSCULAR | Status: AC
  Filled 2020-02-02: qty 5

## 2020-02-02 MED ORDER — ACETAMINOPHEN 160 MG/5ML PO SOLN: 1000.0000 mg | Freq: Once | ORAL | Status: DC | PRN

## 2020-02-02 MED ORDER — SUGAMMADEX SODIUM 200 MG/2ML IV SOLN
INTRAVENOUS | Status: DC | PRN
  Administered 2020-02-02: 200 mg via INTRAVENOUS

## 2020-02-02 MED ORDER — ALUM & MAG HYDROXIDE-SIMETH 200-200-20 MG/5ML PO SUSP: 15.0000 mL | ORAL | Status: DC | PRN

## 2020-02-02 MED ORDER — ROCURONIUM BROMIDE 10 MG/ML (PF) SYRINGE
PREFILLED_SYRINGE | INTRAVENOUS | Status: DC | PRN
  Administered 2020-02-02: 50 mg via INTRAVENOUS
  Administered 2020-02-02: 10 mg via INTRAVENOUS

## 2020-02-02 MED ORDER — FENTANYL CITRATE (PF) 100 MCG/2ML IJ SOLN: 25.0000 ug | INTRAMUSCULAR | Status: DC | PRN

## 2020-02-02 MED ORDER — CHLORHEXIDINE GLUCONATE 0.12 % MT SOLN
OROMUCOSAL | Status: AC
  Administered 2020-02-02: 15 mL via OROMUCOSAL
  Filled 2020-02-02: qty 15

## 2020-02-02 MED ORDER — OXYCODONE HCL 5 MG PO TABS: 5.0000 mg | ORAL_TABLET | Freq: Once | ORAL | Status: DC | PRN

## 2020-02-02 MED ORDER — LABETALOL HCL 5 MG/ML IV SOLN
INTRAVENOUS | Status: DC | PRN
Start: 2020-02-02 — End: 2020-02-02
  Administered 2020-02-02 (×2): 10 mg via INTRAVENOUS

## 2020-02-02 MED ORDER — SODIUM CHLORIDE 0.9 % IV SOLN: INTRAVENOUS | Status: DC

## 2020-02-02 MED ORDER — IODIXANOL 320 MG/ML IV SOLN
INTRAVENOUS | Status: DC | PRN
  Administered 2020-02-02: 100 mL via INTRA_ARTERIAL

## 2020-02-02 MED ORDER — PROPOFOL 10 MG/ML IV BOLUS
INTRAVENOUS | Status: DC | PRN
  Administered 2020-02-02: 100 mg via INTRAVENOUS

## 2020-02-02 MED ORDER — HYDRALAZINE HCL 20 MG/ML IJ SOLN
INTRAMUSCULAR | Status: DC | PRN
  Administered 2020-02-02: 15 mg via INTRAVENOUS
  Administered 2020-02-02: 5 mg via INTRAVENOUS

## 2020-02-02 MED ORDER — MIDAZOLAM HCL 2 MG/2ML IJ SOLN
INTRAMUSCULAR | Status: DC | PRN
  Administered 2020-02-02: 1 mg via INTRAVENOUS

## 2020-02-02 MED ORDER — PROTAMINE SULFATE 10 MG/ML IV SOLN
INTRAVENOUS | Status: AC
  Filled 2020-02-02: qty 10

## 2020-02-02 MED ORDER — HEPARIN SODIUM (PORCINE) 1000 UNIT/ML IJ SOLN
INTRAMUSCULAR | Status: AC
  Filled 2020-02-02: qty 1

## 2020-02-02 MED ORDER — ACETAMINOPHEN 10 MG/ML IV SOLN: 1000.0000 mg | Freq: Once | INTRAVENOUS | Status: DC | PRN

## 2020-02-02 MED ORDER — CEFAZOLIN SODIUM-DEXTROSE 2-4 GM/100ML-% IV SOLN
INTRAVENOUS | Status: AC
  Filled 2020-02-02: qty 100

## 2020-02-02 MED ORDER — MAGNESIUM SULFATE 2 GM/50ML IV SOLN: 2.0000 g | Freq: Every day | INTRAVENOUS | Status: DC | PRN

## 2020-02-02 MED ORDER — PROPOFOL 10 MG/ML IV BOLUS
INTRAVENOUS | Status: AC
  Filled 2020-02-02: qty 20

## 2020-02-02 MED ORDER — LIDOCAINE 2% (20 MG/ML) 5 ML SYRINGE
INTRAMUSCULAR | Status: DC | PRN
  Administered 2020-02-02: 60 mg via INTRAVENOUS

## 2020-02-02 MED ORDER — ONDANSETRON HCL 4 MG/2ML IJ SOLN
INTRAMUSCULAR | Status: AC
  Filled 2020-02-02: qty 2

## 2020-02-02 MED ORDER — DEXAMETHASONE SODIUM PHOSPHATE 10 MG/ML IJ SOLN
INTRAMUSCULAR | Status: DC | PRN
  Administered 2020-02-02: 10 mg via INTRAVENOUS

## 2020-02-02 MED ORDER — LABETALOL HCL 5 MG/ML IV SOLN
INTRAVENOUS | Status: DC | PRN
  Administered 2020-02-02: 5 mg via INTRAVENOUS

## 2020-02-02 MED ORDER — HYDRALAZINE HCL 20 MG/ML IJ SOLN
INTRAMUSCULAR | Status: AC
  Filled 2020-02-02: qty 1

## 2020-02-02 MED ORDER — CHLORHEXIDINE GLUCONATE 0.12 % MT SOLN
15.0000 mL | Freq: Once | OROMUCOSAL | Status: AC
  Filled 2020-02-02: qty 15

## 2020-02-02 MED ORDER — DEXAMETHASONE SODIUM PHOSPHATE 10 MG/ML IJ SOLN
INTRAMUSCULAR | Status: AC
  Filled 2020-02-02: qty 1

## 2020-02-02 MED ORDER — SUCCINYLCHOLINE CHLORIDE 200 MG/10ML IV SOSY
PREFILLED_SYRINGE | INTRAVENOUS | Status: DC | PRN
  Administered 2020-02-02: 140 mg via INTRAVENOUS

## 2020-02-02 MED ORDER — ACETAMINOPHEN 500 MG PO TABS: 1000.0000 mg | ORAL_TABLET | Freq: Once | ORAL | Status: DC | PRN

## 2020-02-02 MED ORDER — LACTATED RINGERS IV SOLN: INTRAVENOUS | Status: DC | PRN

## 2020-02-02 MED ORDER — DOCUSATE SODIUM 100 MG PO CAPS
100.0000 mg | ORAL_CAPSULE | Freq: Two times a day (BID) | ORAL | Status: DC
  Administered 2020-02-02 – 2020-02-05 (×7): 100 mg via ORAL
  Filled 2020-02-02 (×7): qty 1

## 2020-02-02 MED ORDER — PHENOL 1.4 % MT LIQD: 1.0000 | OROMUCOSAL | Status: DC | PRN

## 2020-02-02 MED ORDER — LABETALOL HCL 5 MG/ML IV SOLN
INTRAVENOUS | Status: AC
  Filled 2020-02-02: qty 4

## 2020-02-02 MED ORDER — SODIUM CHLORIDE 0.9 % IV SOLN: 500.0000 mL | Freq: Once | INTRAVENOUS | Status: DC | PRN

## 2020-02-02 MED ORDER — CEFAZOLIN SODIUM-DEXTROSE 2-4 GM/100ML-% IV SOLN
2.0000 g | Freq: Three times a day (TID) | INTRAVENOUS | Status: AC
  Administered 2020-02-02 – 2020-02-03 (×2): 2 g via INTRAVENOUS
  Filled 2020-02-02 (×2): qty 100

## 2020-02-02 MED ORDER — POTASSIUM CHLORIDE CRYS ER 20 MEQ PO TBCR: 20.0000 meq | EXTENDED_RELEASE_TABLET | Freq: Every day | ORAL | Status: DC | PRN

## 2020-02-02 MED ORDER — PANTOPRAZOLE SODIUM 40 MG PO TBEC
40.0000 mg | DELAYED_RELEASE_TABLET | Freq: Every day | ORAL | Status: DC
  Administered 2020-02-03 – 2020-02-06 (×4): 40 mg via ORAL
  Filled 2020-02-02 (×5): qty 1

## 2020-02-02 MED ORDER — CLONAZEPAM 0.25 MG PO TBDP: 0.5000 mg | ORAL_TABLET | Freq: Two times a day (BID) | ORAL | Status: DC | PRN

## 2020-02-02 MED ORDER — ROCURONIUM BROMIDE 10 MG/ML (PF) SYRINGE
PREFILLED_SYRINGE | INTRAVENOUS | Status: AC
  Filled 2020-02-02: qty 20

## 2020-02-02 MED ORDER — CEFAZOLIN SODIUM-DEXTROSE 2-4 GM/100ML-% IV SOLN
2.0000 g | Freq: Once | INTRAVENOUS | Status: AC
  Administered 2020-02-02: 2 g via INTRAVENOUS

## 2020-02-02 MED ORDER — SODIUM CHLORIDE 0.9 % IV SOLN
INTRAVENOUS | Status: AC
  Filled 2020-02-02: qty 1.2

## 2020-02-02 MED ORDER — OXYCODONE HCL 5 MG/5ML PO SOLN: 5.0000 mg | Freq: Once | ORAL | Status: DC | PRN

## 2020-02-02 MED ORDER — SUCCINYLCHOLINE CHLORIDE 200 MG/10ML IV SOSY
PREFILLED_SYRINGE | INTRAVENOUS | Status: AC
  Filled 2020-02-02: qty 10

## 2020-02-02 MED ORDER — 0.9 % SODIUM CHLORIDE (POUR BTL) OPTIME
TOPICAL | Status: DC | PRN
  Administered 2020-02-02: 1000 mL

## 2020-02-02 MED ORDER — HEPARIN SODIUM (PORCINE) 1000 UNIT/ML IJ SOLN
INTRAMUSCULAR | Status: DC | PRN
Start: 2020-02-02 — End: 2020-02-02
  Administered 2020-02-02: 8000 [IU] via INTRAVENOUS

## 2020-02-02 MED ORDER — METOPROLOL TARTRATE 5 MG/5ML IV SOLN: 2.0000 mg | INTRAVENOUS | Status: DC | PRN

## 2020-02-02 MED ORDER — SODIUM CHLORIDE 0.9 % IV SOLN
INTRAVENOUS | Status: DC | PRN
  Administered 2020-02-02: 500 mL

## 2020-02-02 SURGICAL SUPPLY — 54 items
CANISTER SUCT 3000ML PPV (MISCELLANEOUS) ×2 IMPLANT
CANNULA VESSEL 3MM 2 BLNT TIP (CANNULA) ×4 IMPLANT
CATH ACCU-VU SIZ PIG 5F 100CM (CATHETERS) ×2 IMPLANT
CATH BEACON 5.038 65CM KMP-01 (CATHETERS) IMPLANT
CLIP VESOCCLUDE SM WIDE 24/CT (CLIP) ×2 IMPLANT
COVER PROBE W GEL 5X96 (DRAPES) ×2 IMPLANT
COVER WAND RF STERILE (DRAPES) IMPLANT
DERMABOND ADVANCED (GAUZE/BANDAGES/DRESSINGS) ×1
DERMABOND ADVANCED .7 DNX12 (GAUZE/BANDAGES/DRESSINGS) ×1 IMPLANT
DEVICE CLOSURE PERCLS PRGLD 6F (VASCULAR PRODUCTS) IMPLANT
DRSG TEGADERM 2-3/8X2-3/4 SM (GAUZE/BANDAGES/DRESSINGS) ×2 IMPLANT
DRYSEAL FLEXSHEATH 22FR 33CM (SHEATH) ×1
ELECT REM PT RETURN 9FT ADLT (ELECTROSURGICAL) ×4
ELECTRODE REM PT RTRN 9FT ADLT (ELECTROSURGICAL) ×2 IMPLANT
GLOVE BIO SURGEON STRL SZ7.5 (GLOVE) ×2 IMPLANT
GOWN STRL REUS W/ TWL LRG LVL3 (GOWN DISPOSABLE) ×3 IMPLANT
GOWN STRL REUS W/TWL LRG LVL3 (GOWN DISPOSABLE) ×3
KIT BASIN OR (CUSTOM PROCEDURE TRAY) ×2 IMPLANT
KIT TURNOVER KIT B (KITS) ×2 IMPLANT
LOOP VESSEL MAXI BLUE (MISCELLANEOUS) ×4 IMPLANT
LOOP VESSEL MINI RED (MISCELLANEOUS) ×8 IMPLANT
NEEDLE PERC 18GX7CM (NEEDLE) ×2 IMPLANT
NS IRRIG 1000ML POUR BTL (IV SOLUTION) ×4 IMPLANT
PACK ENDOVASCULAR (PACKS) ×2 IMPLANT
PAD ARMBOARD 7.5X6 YLW CONV (MISCELLANEOUS) ×4 IMPLANT
PENCIL BUTTON HOLSTER BLD 10FT (ELECTRODE) ×2 IMPLANT
PERCLOSE PROGLIDE 6F (VASCULAR PRODUCTS)
SET MICROPUNCTURE 5F STIFF (MISCELLANEOUS) ×2 IMPLANT
SHEATH DRYSEAL FLEX 22FR 33CM (SHEATH) ×1 IMPLANT
SHEATH PINNACLE 8F 10CM (SHEATH) ×2 IMPLANT
SPONGE SURGIFOAM ABS GEL 100 (HEMOSTASIS) IMPLANT
STENT GRFT THORAC ACS 34X34X15 (Endovascular Graft) ×4 IMPLANT
STOPCOCK MORSE 400PSI 3WAY (MISCELLANEOUS) ×2 IMPLANT
SUT PROLENE 3 0 SH1 36 (SUTURE) IMPLANT
SUT PROLENE 5 0 C 1 24 (SUTURE) ×2 IMPLANT
SUT PROLENE 6 0 CC (SUTURE) ×2 IMPLANT
SUT SILK 2 0 (SUTURE) ×1
SUT SILK 2-0 18XBRD TIE 12 (SUTURE) ×1 IMPLANT
SUT SILK 3 0 (SUTURE) ×1
SUT SILK 3-0 18XBRD TIE 12 (SUTURE) ×1 IMPLANT
SUT SILK 4 0 (SUTURE) ×1
SUT SILK 4-0 18XBRD TIE 12 (SUTURE) ×1 IMPLANT
SUT VIC AB 2-0 SH 27 (SUTURE) ×1
SUT VIC AB 2-0 SH 27XBRD (SUTURE) ×1 IMPLANT
SUT VIC AB 3-0 SH 27 (SUTURE) ×2
SUT VIC AB 3-0 SH 27X BRD (SUTURE) ×2 IMPLANT
SUT VICRYL 4-0 PS2 18IN ABS (SUTURE) ×2 IMPLANT
SYR 50ML LL SCALE MARK (SYRINGE) ×2 IMPLANT
TOWEL GREEN STERILE (TOWEL DISPOSABLE) ×2 IMPLANT
TRAY FOLEY MTR SLVR 16FR STAT (SET/KITS/TRAYS/PACK) ×2 IMPLANT
TUBING HIGH PRESSURE 120CM (CONNECTOR) ×2 IMPLANT
WIRE BENTSON .035X145CM (WIRE) ×2 IMPLANT
WIRE ROSEN-J .035X260CM (WIRE) ×2 IMPLANT
WIRE STIFF LUNDERQUIST 260CM (WIRE) ×2 IMPLANT

## 2020-02-02 NOTE — Transfer of Care (Signed)
Immediate Anesthesia Transfer of Care Note  Patient: Scott [name]  Procedure(s) Performed: REPAIR TYPE B THORACIC AORTIC ENDOVASCULAR STENT (N/A )  Patient Location: PACU  Anesthesia Type:General  Level of Consciousness: patient cooperative and confused  Airway & Oxygen Therapy: Patient Spontanous Breathing and Patient connected to nasal cannula oxygen  Post-op Assessment: Report given to RN, Post -op Vital signs reviewed and stable and Patient moving all extremities  Post vital signs: Reviewed and stable  Last Vitals:  Vitals Value Taken Time  BP    Temp    Pulse    Resp    SpO2      Last Pain:  Vitals:   02/02/20 0935  TempSrc:   PainSc: 4       Patients Stated Pain Goal: 2 (76/14/70 9295)  Complications: No complications documented.

## 2020-02-02 NOTE — Consult Note (Signed)
[facility]   Subjective  - pt with worse back pain overnight, no complaints in legs   Objective (!) 153/69 69 98.3 F (36.8 C) (Oral) 19 98%  Intake/Output Summary (Last 24 hours) at 02/02/2020 0920 Last data filed at 02/02/2020 0200 Gross per 24 hour  Intake 480 ml  Output 375 ml  Net 105 ml   Abdomen soft non tender Extremities no left femoral or pedal pulses, 2+ right femoral and DP Left and right foot pink and warm with symmetric cap refill   Assessment/Planning: Worsening back pain today with known dissection Left iliac artery compromise but currently no malperfusion of left leg  Will repeat CTA chest abdomen pelvis today for interval change  Ruta Hinds 02/02/2020 9:20 AM --  Laboratory Lab Results: Recent Labs    01/31/20 0604 02/01/20 0735  WBC 12.1* 11.1*  HGB 12.5* 13.4  HCT 37.7* 40.5  PLT 146* 161   BMET Recent Labs    01/31/20 0604 02/01/20 0735  NA 137 135  K 4.4 4.1  CL 103 101  CO2 26 23  GLUCOSE 124* 126*  BUN 18 22  CREATININE 1.06 1.12  CALCIUM 9.0 9.1    COAG Lab Results  Component Value Date   INR 1.1 01/29/2020   No results found for: PTT

## 2020-02-02 NOTE — Op Note (Signed)
Procedure: Thoracic aortic stent graft for repair of type B aortic dissection (Gore 34 x 34 x 15, second piece Gore 34 x 34 x 15)  Right common femoral artery exposure and repair for access  Preoperative diagnosis: Type B aortic dissection  Postoperative diagnosis: Same  Anesthesia: General  Assistant: Gerri Lins, PA-C for assistance with retraction expediting wires and catheters and assistance with anastomotic repair  Operative findings: 1.  Thoracic stent graft extending from the left subclavian to the celiac axis  2.  Restoration of patency of the left iliac system post stenting  3.  Right common femoral artery 22 French dry seal sheath  4.  Patient with bilateral femoral pulses at conclusion of case  Operative details: After the informed consent, the patient was taken the operating.  The patient was placed in supine position on the operating table after a lumbar drain had been placed by the anesthesia team.  Next after induction general anesthesia and endotracheal ovation patient was prepped and draped in usual sterile fashion from the nipples to the knees.  Longitudinal incision was made in the right groin carried down through the subtenons tissues down the level right common femoral artery.  This had a good quality pulse within it.  The artery was fairly soft on palpation with some posterior plaque.  Profunda femoris and superficial femoral arteries were dissected free circumferentially and Vesseloops placed around these.  Common femoral artery was dissected free circumferentially and the distal external iliac artery dissected free circumferentially up underneath the inguinal ligament.  This point patient was given 8000 inch of heparin.  ACT was confirmed to be greater than 250.  An introducer needle was placed into the right common femoral artery and an 035 Bentson wire advanced into the abdominal aorta and a 9 French sheath placed over this in the right common femoral artery.  This  was thoroughly flushed overnight saline.  A 5 French pigtail catheter was advanced over the guidewire to make sure that we were within the true lumen of the aortic dissection.  This was advanced all the way up into the ascending aorta.  An 035 curved Lunderquist wire was then advanced through the pigtail catheter and parked on the aortic valve.  A 34 x 34 x 15 cm Gore thoracic stent graft device was selected.  A 22 French dry seal sheath was advanced over the Lunderquist wire into the distal descending thoracic aorta.  The graft was then advanced through the sheath up to an area near the subclavian artery.  A buddy wire technique was then used to advance the 035 Rosen wire up into the ascending aorta and the pigtail catheter advanced over this.  Contrast angiogram was then obtained in a 70 degree LAO projection to determine precise location of the left subclavian artery.  We then deployed the 34 x 34 x 15 cm graft down to just above the diaphragm.  An additional same size graft was then placed in overlapping fashion extending all the way down to the origin of the celiac artery.  Origin of the celiac was confirmed with contrast angiogram.  A completion angiogram was then performed by advancing the pigtail catheter back up into the ascending aorta.  This showed good wall apposition of the stent graft as well as wide patency of the left subclavian artery with the edge of the stent abutting the origin of the left subclavian artery.  The distal segment was just above the celiac artery and the celiac and SMA were  patent.  The pigtail catheter was then brought down just above the aortic bifurcation.  Contrast angiogram was performed of the distal aorta and iliac system.  This now showed wide patency of the left common iliac and external iliac and internal iliac bilaterally.  There was some mild narrowing of the proximal common iliac artery of about 25 to 30%.  However the patient had a bounding femoral pulse at this point  and I suspect that this will improve with time.  The sheath guidewires all delivery systems were removed from the right common femoral artery and this was controlled proximally with a Henley clamp.  It was controlled distally with a peripheral DeBakey clamp.  The arteriotomy was then repaired with a running 5-0 Prolene suture.  Just prior to completion of the anastomosis it was for blood backbled and thoroughly flushed reanastomosed was secured clamps released flow was restored to the right leg.  Doppler was used to evaluate both feet and these both had good dorsalis pedis and posterior tibial Doppler flow.  Hemostasis was obtained with administration of 80 mg of protamine and direct pressure.  The right groin was then closed in multiple layers with running 2-0 and 3-0 Vicryl suture and a 4-0 Vicryl subcuticular stitch in the skin.  Dermabond was applied.  The patient tired procedure well and there were no complications.  Instrument sponge and needle counts correct in the case.  Patient was taken the recovery room in stable condition.  Ruta Hinds, MD [facility] [place] Office: [phone]

## 2020-02-02 NOTE — Anesthesia Procedure Notes (Signed)
Lumbar Drain  Patient location during procedure: OR Start time: 02/02/2020 1:01 PM End time: 02/02/2020 1:21 PM Staffing Anesthesiologist: Oleta Mouse, MD Preanesthetic Checklist Completed: patient identified, IV checked, site marked, risks and benefits discussed, surgical consent, monitors and equipment checked, pre-op evaluation and timeout performed Lumbar Puncture:  Patient position: sitting Prep: ChloraPrep Patient monitoring: heart rate, cardiac monitor, continuous pulse ox and blood pressure Approach: midline Location: L4-5 Injection technique: catheter Needle Needle type: Tuohy  Needle gauge: 14 G Needle length: 9 cm Assessment Events: cerebrospinal fluid Attempts: 2 CSF: clear Post Procedure: drain attached to lumbar drainage system, sterile dressing applied and site cleaned

## 2020-02-02 NOTE — Anesthesia Procedure Notes (Signed)
Procedure Name: Intubation Date/Time: 02/02/2020 1:38 PM Performed by: Verdie Drown, CRNA Pre-anesthesia Checklist: Patient identified, Emergency Drugs available, Suction available and Patient being monitored Patient Re-evaluated:Patient Re-evaluated prior to induction Oxygen Delivery Method: Circle System Utilized Preoxygenation: Pre-oxygenation with 100% oxygen Induction Type: IV induction Ventilation: Mask ventilation without difficulty Laryngoscope Size: Mac and 3 Grade View: Grade I Tube type: Oral Tube size: 7.5 mm Number of attempts: 1 Airway Equipment and Method: Stylet and Oral airway Placement Confirmation: ETT inserted through vocal cords under direct vision,  positive ETCO2 and breath sounds checked- equal and bilateral Secured at: 23 cm Tube secured with: Tape Dental Injury: Teeth and Oropharynx as per pre-operative assessment

## 2020-02-02 NOTE — Anesthesia Preprocedure Evaluation (Signed)
Anesthesia Evaluation  Patient identified by MRN, date of birth, ID band Patient awake    Reviewed: Allergy & Precautions, NPO status , Patient's Chart, lab work & pertinent test results  History of Anesthesia Complications Negative for: history of anesthetic complications  Airway Mallampati: II  TM Distance: >3 FB Neck ROM: Full    Dental  (+) Dental Advisory Given, Teeth Intact   Pulmonary neg pulmonary ROS, neg recent URI,    breath sounds clear to auscultation       Cardiovascular hypertension, Pt. on medications + Peripheral Vascular Disease   Rhythm:Regular     Neuro/Psych negative neurological ROS  negative psych ROS   GI/Hepatic negative GI ROS, Neg liver ROS,   Endo/Other  negative endocrine ROS  Renal/GU negative Renal ROS     Musculoskeletal negative musculoskeletal ROS (+)   Abdominal   Peds  Hematology negative hematology ROS (+)   Anesthesia Other Findings Type B aortic dissection   Reproductive/Obstetrics                             Anesthesia Physical Anesthesia Plan  ASA: III  Anesthesia Plan: General   Post-op Pain Management:    Induction: Intravenous  PONV Risk Score and Plan: 2 and Ondansetron and Dexamethasone  Airway Management Planned: Oral ETT  Additional Equipment: Arterial line  Intra-op Plan:   Post-operative Plan: Extubation in OR and Possible Post-op intubation/ventilation  Informed Consent: I have reviewed the patients History and Physical, chart, labs and discussed the procedure including the risks, benefits and alternatives for the proposed anesthesia with the patient or authorized representative who has indicated his/her understanding and acceptance.     Dental advisory given  Plan Discussed with: CRNA and Surgeon  Anesthesia Plan Comments: (Spinal drain at request of surgeon)        Anesthesia Quick Evaluation

## 2020-02-02 NOTE — Anesthesia Postprocedure Evaluation (Signed)
Anesthesia Post Note  Patient: Scott [name]  Procedure(s) Performed: REPAIR TYPE B THORACIC AORTIC ENDOVASCULAR STENT (N/A )     Patient location during evaluation: PACU Anesthesia Type: General Level of consciousness: awake and confused Pain management: pain level controlled Vital Signs Assessment: post-procedure vital signs reviewed and stable Respiratory status: spontaneous breathing, nonlabored ventilation, respiratory function stable and patient connected to nasal cannula oxygen Cardiovascular status: blood pressure returned to baseline and stable Postop Assessment: no apparent nausea or vomiting Anesthetic complications: no   No complications documented.  Last Vitals:  Vitals:   02/02/20 1843 02/02/20 1900  BP:  (!) 147/71  Pulse: 76 74  Resp: 15 13  Temp:    SpO2: 92% 91%    Last Pain:  Vitals:   02/02/20 1904  TempSrc:   PainSc: Asleep                 Daiel Strohecker

## 2020-02-02 NOTE — Anesthesia Procedure Notes (Signed)
Arterial Line Insertion Start/End8/14/2021 12:00 PM, 02/02/2020 12:05 PM Performed by: Verdie Drown, CRNA, CRNA  Patient location: Pre-op. Preanesthetic checklist: patient identified, IV checked, site marked, risks and benefits discussed, surgical consent, monitors and equipment checked, pre-op evaluation, timeout performed and anesthesia consent Lidocaine 1% used for infiltration Left, radial was placed Catheter size: 20 G Hand hygiene performed  and maximum sterile barriers used   Attempts: 1 Procedure performed without using ultrasound guided technique. Following insertion, dressing applied and Biopatch. Post procedure assessment: normal  Patient tolerated the procedure well with no immediate complications.

## 2020-02-02 NOTE — Progress Notes (Addendum)
NAME:  Scott [name], MRN:  [id number], DOB:  1951/05/16, LOS: 4 ADMISSION DATE:  01/29/2020, CONSULTATION DATE: 01/29/2020 REFERRING MD: Dr. Melina Copa, CHIEF COMPLAINT: Dissecting abdominal aortic aneurysm  Brief History   69 year old male presents with complaints of 5 out of 10 mid back pain with associated bilateral leg weakness found to have small dissecting abdominal aortic aneurysm on ED evaluation.  Vascular surgery consulted at [facility] who recommended transfer to [facility].  PCCM consulted for further management and admission  History of present illness   Scott [name] is a 69 year old male with past medical history significant for hypertension who presented to the emergency department with 5 out of 10 mid back pain.  Patient reported back pain began around 5 AM and is also associated with bilateral leg weakness that is progressively worsening.  Patient denies any chest pain, abdominal pain, or bowel or bladder incontinence.  On arrival to emergency department patient was seen mildly diaphoretic with continued back pain and lower extremity weakness.  Vital signs significant hypertensive urgency with a blood pressure of 208/96.  Lab work significant for mild hyperglycemia and mild hypokalemia.  EKG with sinus rhythm and multiple PVCs.  CT angiogram chest/abdomen/pelvis obtained on arrival that revealed small dissecting abdominal aortic aneurysm.  At this time vascular surgery was consulted by [facility] emergency department physician who recommended transfer to [facility] for further evaluation.  Vascular surgery recommended critical care admit.  Of note patient reports he recently ran out of his antihypertensives and when he called his PCP for refills he was requested to present for follow up but he failed to do so.    Past Medical History  Hypertension  Significant Hospital Events   Admitted 8/10  Consults:  Vascular surgery  Procedures:  None  Significant Diagnostic Tests:  CT angio  chest/abdomen/pelvis 8/10 > Acute aortic syndrome with presence both penetrating ulcer/entry tear in the aortic arch beyond left subclavian origin   MRI spine 8/10 >  1. Degraded by motion despite repeated imaging attempts. 2. 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 Suboptimal cord detail elsewhere, although no levels of cord swelling to suggest ischemic cytotoxic edema. 3. Occasional mild thoracic spinal stenosis related to spine degeneration and/or epidural lipomatosis. Up to moderate degenerative neural foraminal stenosis at the right T3 and left T10 nerve levels.   Micro Data:  COVID 8/10 > Negative   Antimicrobials:  None  Interim history/subjective:  Very restless this morning and very frustrated with significantly worsening pain overnight -- states worse than on presentation   C/o severe back pain, worse than days prior   SBP 130-170 overnight; on my exam 159/80   No AM labs for review 8/14 Objective   Blood pressure (!) 153/69, pulse 67, temperature 98.3 F (36.8 C), temperature source Oral, resp. rate 15, height 5' 8.5" (1.74 m), weight 81.2 kg, SpO2 96 %.        Intake/Output Summary (Last 24 hours) at 02/02/2020 0814 Last data filed at 02/02/2020 0200 Gross per 24 hour  Intake 480 ml  Output 375 ml  Net 105 ml   Filed Weights   01/31/20 0610 02/01/20 0500 02/02/20 0500  Weight: 83.9 kg 83.2 kg 81.2 kg    Examination: Gen. Restless appearing older adult M, reclined in bed in moderate distress due to pain  HEENT: NCAT pink mmm trachea midline anicteric sclera  Lungs: CTA bilaterally, symmetrical chest expansion, no accessory muscle  use on RA  Cardiovascular: RRR s1s2 no rgm cap refill < 3 seconds  Abdomen: Round, soft. + bowel sounds  Musculoskeletal: No obvious joint deformity. No cyanosis or clubbing  Neuro:  AAOx4 following commands [facility]    Resolved Hospital Problem list      Assessment & Plan:  Dissecting abdominal aortic aneurysm -Patient presented with 5 out of 10 mid thoracic back pain with associated bilateral progressive leg weakness -CTA chest/abdomen/pelvis positive for dissecting aortic aneurysm and on ED arrival -worsening pain 8/14. Requiring PRN BP medications for goal < 140  P: VVS following and plan to repeat CTA Sunday 8/15-- will discuss with VVS in light of worsening back pain overnight-- sent for STAT CTA c/a/p Consideration of possible surgical interventions per VVS  Close BP control, with SBP goal < 140 Maintain active type and screen  PRN analgesia  --- update --- CTA with progression of dissection and further compromise of aortic lumen. Taken to OR with VVS 8/14  Hypertensive urgency  -Patient has a history of HTN medications reconciliation is pending but it appears patient is currently on no chronic antihypertensives  -Blood pressure was 207/109 on admission P: SBP goal < 140, remains off of continuous infusions  Hydralazine 75mg  TID PO Norvasc 10mg  qD PO Labetalol 100mg  BID PO  PRN hydral IV, PRN labetalol IV  Will need OP follow up for HTN  Constipation -Patient reported issues with constipation evening of 8/11 P: Continue bowel regimen   Best practice:  Diet: Thin liquids  Pain/Anxiety/Delirium protocol (if indicated): PRNs VAP protocol (if indicated): N/A DVT prophylaxis: SCD GI prophylaxis: PPI Glucose control: SSI Mobility: Bedrest Code Status: Full Family Communication: pt updated. Daughters updated   Disposition: ICU   Labs   CBC: Recent Labs  Lab 01/29/20 0810 01/30/20 0121 01/31/20 0604 02/01/20 0735  WBC 8.7 13.0* 12.1* 11.1*  NEUTROABS  --   --  9.1*  --   HGB 15.4 13.4 12.5* 13.4  HCT 45.7 39.4 37.7* 40.5  MCV 99.6 99.0 99.0 98.5  PLT 206 184 146* 967    Basic Metabolic Panel: Recent Labs  Lab 01/29/20 0810 01/30/20 0121 01/31/20 0604 02/01/20 0735  NA 140 138 137 135  K 3.1* 4.6  4.4 4.1  CL 105 106 103 101  CO2 20* 22 26 23   GLUCOSE 138* 141* 124* 126*  BUN 22 18 18 22   CREATININE 1.05 1.00 1.06 1.12  CALCIUM 8.9 8.7* 9.0 9.1  MG  --  1.9  --   --   PHOS  --  2.8  --   --    GFR: Estimated Creatinine Clearance: 59.6 mL/min (by C-G formula based on SCr of 1.12 mg/dL). Recent Labs  Lab 01/29/20 0810 01/30/20 0121 01/31/20 0604 02/01/20 0735  WBC 8.7 13.0* 12.1* 11.1*   CRITICAL CARE Performed by: Cristal Generous   Total critical care time: 38 minutes  Critical care time was exclusive of separately billable procedures and treating other patients.  Critical care was necessary to treat or prevent imminent or life-threatening deterioration.  Critical care was time spent personally by me on the following activities: development of treatment plan with patient and/or surrogate as well as nursing, discussions with consultants, evaluation of patient's response to treatment, examination of patient, obtaining history from patient or surrogate, ordering and performing treatments and interventions, ordering and review of laboratory studies, ordering and review of radiographic studies, pulse oximetry and re-evaluation of patient's condition.   Eliseo Gum MSN, AGACNP-BC [place]  [place] [phone] If no answer, [phone] 02/02/2020, 8:14 AM

## 2020-02-02 NOTE — Anesthesia Procedure Notes (Addendum)
Central Venous Catheter Insertion Performed by: Oleta Mouse, MD, anesthesiologist Start/End8/14/2021 1:51 PM, 02/02/2020 1:58 PM Patient location: OR. Preanesthetic checklist: patient identified, IV checked, site marked, risks and benefits discussed, surgical consent, monitors and equipment checked, pre-op evaluation, timeout performed and anesthesia consent Lidocaine 1% used for infiltration and patient sedated Hand hygiene performed  and maximum sterile barriers used  Catheter size: 8 Fr Total catheter length 16. Central line was placed.Double lumen Procedure performed using ultrasound guided technique. Ultrasound Notes:anatomy identified, needle tip was noted to be adjacent to the nerve/plexus identified, no ultrasound evidence of intravascular and/or intraneural injection and image(s) printed for medical record Attempts: 1 Following insertion, dressing applied, line sutured and Biopatch. Post procedure assessment: blood return through all ports, free fluid flow and no air  Patient tolerated the procedure well with no immediate complications.

## 2020-02-02 NOTE — Progress Notes (Signed)
CT scan shows progression of dissection with more compromise of aortic lumen.  Will proceed to OR emergently for thoracic stent and possible fem fem  D/w pt and daughter risk  Benefits possible complications including but not limited to bleeding infection paralysis extension of dissection proximally with possible death.  They understand and agree to proceed.  Will d/w anesthesia placement of lumbar drain to reduce risk of paralysis.  Ruta Hinds, MD [facility] [place] Office: [phone]

## 2020-02-03 ENCOUNTER — Inpatient Hospital Stay (HOSPITAL_COMMUNITY): Payer: No Typology Code available for payment source | Admitting: Anesthesiology

## 2020-02-03 DIAGNOSIS — I161 Hypertensive emergency: Secondary | ICD-10-CM | POA: Diagnosis not present

## 2020-02-03 DIAGNOSIS — I7102 Dissection of abdominal aorta: Secondary | ICD-10-CM | POA: Diagnosis not present

## 2020-02-03 DIAGNOSIS — I714 Abdominal aortic aneurysm, without rupture: Secondary | ICD-10-CM | POA: Diagnosis not present

## 2020-02-03 LAB — BASIC METABOLIC PANEL
Anion gap: 10 (ref 5–15)
BUN: 31 mg/dL — ABNORMAL HIGH (ref 8–23)
CO2: 22 mmol/L (ref 22–32)
Calcium: 8.6 mg/dL — ABNORMAL LOW (ref 8.9–10.3)
Chloride: 103 mmol/L (ref 98–111)
Creatinine, Ser: 1.09 mg/dL (ref 0.61–1.24)
GFR calc Af Amer: >60 mL/min (ref >60)
GFR calc non Af Amer: >60 mL/min (ref >60)
Glucose, Bld: 149 mg/dL — ABNORMAL HIGH (ref 70–99)
Potassium: 4 mmol/L (ref 3.5–5.1)
Sodium: 135 mmol/L (ref 135–145)

## 2020-02-03 LAB — CBC
HCT: 35.4 % — ABNORMAL LOW (ref 39.0–52.0)
Hemoglobin: 12 g/dL — ABNORMAL LOW (ref 13.0–17.0)
MCH: 33.8 pg (ref 26.0–34.0)
MCHC: 33.9 g/dL (ref 30.0–36.0)
MCV: 99.7 fL (ref 80.0–100.0)
Platelets: 165 10*3/uL (ref 150–400)
RBC: 3.55 MIL/uL — ABNORMAL LOW (ref 4.22–5.81)
RDW: 13.1 % (ref 11.5–15.5)
WBC: 13.5 10*3/uL — ABNORMAL HIGH (ref 4.0–10.5)
nRBC: 0 % (ref 0.0–0.2)

## 2020-02-03 MED ORDER — METHOCARBAMOL 500 MG PO TABS
500.0000 mg | ORAL_TABLET | Freq: Three times a day (TID) | ORAL | Status: DC
  Administered 2020-02-03 – 2020-02-04 (×4): 500 mg via ORAL
  Filled 2020-02-03 (×4): qty 1

## 2020-02-03 MED ORDER — OXYCODONE HCL 5 MG PO TABS
10.0000 mg | ORAL_TABLET | Freq: Four times a day (QID) | ORAL | Status: DC | PRN
  Administered 2020-02-03: 5 mg via ORAL
  Administered 2020-02-03 – 2020-02-04 (×3): 10 mg via ORAL
  Administered 2020-02-05: 5 mg via ORAL
  Administered 2020-02-05 – 2020-02-06 (×4): 10 mg via ORAL
  Filled 2020-02-03 (×9): qty 2

## 2020-02-03 MED ORDER — QUININE SULFATE 324 MG PO CAPS
648.0000 mg | ORAL_CAPSULE | Freq: Three times a day (TID) | ORAL | Status: DC
  Administered 2020-02-03 – 2020-02-04 (×3): 648 mg via ORAL
  Filled 2020-02-03 (×6): qty 2

## 2020-02-03 NOTE — Anesthesia Procedure Notes (Addendum)
Lumbar Drain  Patient location during procedure: ICU Start time: 02/03/2020 7:30 PM End time: 02/03/2020 7:50 PM Staffing Performed: anesthesiologist  Anesthesiologist: Effie Berkshire, MD Preanesthetic Checklist Completed: patient identified, IV checked, site marked, risks and benefits discussed, monitors and equipment checked and timeout performed Lumbar Puncture:  Patient position: sitting Prep: ChloraPrep Patient monitoring: heart rate, cardiac monitor, continuous pulse ox and blood pressure Approach: midline Location: L3-4 Injection technique: catheter Needle Needle gauge: 14 G Needle length: 9 cm Assessment Events: paresthesia Attempts: 1 CSF: clear Post Procedure: drain attached to lumbar drainage system, site cleaned and sterile dressing applied  Additional Notes 1st drain broken, unable to recover.   2nd drain placed without resistance or obvious immediate complication. Pt tolerated well.   PLTS 165  No anticoagulants within ASRA limitations per RN and review.

## 2020-02-03 NOTE — Progress Notes (Signed)
Patient complaining of upper left thigh/leg pain despite hourly pain medicine. Patient can't pinpoint whether it's muscle or another kind of pain. Muscles spasms are felt by RN both legs and patient asks if muscle relaxants were an option.   A-line and cuff blood pressure elevate with pain. Patient is currently on 13 mg of clevidipine. RN gave PRN hydralazine for 154/78 (cuff pressure).

## 2020-02-03 NOTE — Progress Notes (Signed)
NAME:  Scott [name], MRN:  [id number], DOB:  16-Oct-1950, LOS: 5 ADMISSION DATE:  01/29/2020, CONSULTATION DATE: 01/29/2020 REFERRING MD: Dr. Melina Copa, CHIEF COMPLAINT: Dissecting abdominal aortic aneurysm  Brief History   69 year old male presents with complaints of 5 out of 10 mid back pain with associated bilateral leg weakness found to have small dissecting abdominal aortic aneurysm on ED evaluation.  Vascular surgery consulted at [facility] who recommended transfer to [facility].  PCCM consulted for further management and admission  History of present illness   Scott [name] is a 69 year old male with past medical history significant for hypertension who presented to the emergency department with 5 out of 10 mid back pain.  Patient reported back pain began around 5 AM and is also associated with bilateral leg weakness that is progressively worsening.  Patient denies any chest pain, abdominal pain, or bowel or bladder incontinence.  On arrival to emergency department patient was seen mildly diaphoretic with continued back pain and lower extremity weakness.  Vital signs significant hypertensive urgency with a blood pressure of 208/96.  Lab work significant for mild hyperglycemia and mild hypokalemia.  EKG with sinus rhythm and multiple PVCs.  CT angiogram chest/abdomen/pelvis obtained on arrival that revealed small dissecting abdominal aortic aneurysm.  At this time vascular surgery was consulted by [facility] emergency department physician who recommended transfer to [facility] for further evaluation.  Vascular surgery recommended critical care admit.  Of note patient reports he recently ran out of his antihypertensives and when he called his PCP for refills he was requested to present for follow up but he failed to do so.    Past Medical History  Hypertension  Significant Hospital Events   Admitted 8/10  Consults:  Vascular surgery  Procedures:  None  Significant Diagnostic Tests:  CT angio  chest/abdomen/pelvis 8/10 > Acute aortic syndrome with presence both penetrating ulcer/entry tear in the aortic arch beyond left subclavian origin   MRI spine 8/10 >  1. Degraded by motion despite repeated imaging attempts. 2. 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 Suboptimal cord detail elsewhere, although no levels of cord swelling to suggest ischemic cytotoxic edema. 3. Occasional mild thoracic spinal stenosis related to spine degeneration and/or epidural lipomatosis. Up to moderate degenerative neural foraminal stenosis at the right T3 and left T10 nerve levels.  CTA 02/02/20 - Compared to 8/11 CTA, progression of AAA with increased size of false lumen extending to left external iliac artery. Micro Data:  COVID 8/10 > Negative   Antimicrobials:  None  Interim history/subjective:  S/p aortic thoracic stent placement yesterday  Objective   Blood pressure (!) 146/62, pulse 85, temperature 98.2 F (36.8 C), temperature source Oral, resp. rate 14, height 5' 8.5" (1.74 m), weight 83.7 kg, SpO2 96 %.        Intake/Output Summary (Last 24 hours) at 02/03/2020 1059 Last data filed at 02/03/2020 0500 Gross per 24 hour  Intake 2975.9 ml  Output 1267 ml  Net 1708.9 ml   Filed Weights   02/01/20 0500 02/02/20 0500 02/03/20 0500  Weight: 83.2 kg 81.2 kg 83.7 kg   Physical Exam: General: Well-appearing, no acute distress HENT: [place], AT, OP clear, MMM Eyes: EOMI, no scleral icterus Respiratory: Clear to auscultation bilaterally.  No crackles, wheezing or rales Cardiovascular: RRR, -M/R/G, no JVD GI: BS+, soft, nontender Extremities:-Edema,-tenderness, distal pulses palpable Skin: Right groin incision, no oozing Neuro: AAO x4, CNII-XII grossly  intact, movement x 4 equal in strength bilaterally  Resolved Hospital Problem list     Assessment & Plan:   Dissecting abdominal aortic aneurysm s/p thoracic aortic  stent graft 8/14 -Patient presented with 5 out of 10 mid thoracic back pain with associated bilateral progressive leg weakness -CTA CAP 8/14 with progressive dissecting aortic aneurysm requiring stent P: Post-op care including lumbar drain management per Vascular Close BP control, with SBP goal < 140 PRN pain management: Increased PRN oxycodone to 10mg  q4h  Hypertensive urgency  -Patient has a history of HTN medications reconciliation is pending but it appears patient is currently on no chronic antihypertensives  -Blood pressure was 207/109 on admission P: Wean cleviprex gtt for goal SBP goal < 140 Hydralazine 75mg  TID PO Norvasc 10mg  qD PO Labetalol 100mg  BID PO  PRN hydral IV, PRN labetalol IV  Will need OP follow up for HTN  Constipation -Patient reported issues with constipation evening of 8/11 P: Continue bowel regimen   Best practice:  Diet: Thin liquids  Pain/Anxiety/Delirium protocol (if indicated): PRNs VAP protocol (if indicated): N/A DVT prophylaxis: SCD GI prophylaxis: PPI Glucose control: SSI Mobility: Bedrest until lumbar drain removed Code Status: Full Family Communication: Patient updated at bedside 8/15   Disposition: ICU   Labs   CBC: Recent Labs  Lab 01/31/20 0604 02/01/20 0735 02/02/20 1012 02/02/20 1842 02/03/20 0333  WBC 12.1* 11.1* 10.2 11.2* 13.5*  NEUTROABS 9.1*  --   --   --   --   HGB 12.5* 13.4 12.5* 12.1* 12.0*  HCT 37.7* 40.5 36.8* 36.4* 35.4*  MCV 99.0 98.5 98.7 98.9 99.7  PLT 146* 161 168 165 536    Basic Metabolic Panel: Recent Labs  Lab 01/30/20 0121 01/31/20 0604 02/01/20 0735 02/02/20 1012 02/03/20 0333  NA 138 137 135 132* 135  K 4.6 4.4 4.1 4.0 4.0  CL 106 103 101 100 103  CO2 22 26 23  21* 22  GLUCOSE 141* 124* 126* 110* 149*  BUN 18 18 22  27* 31*  CREATININE 1.00 1.06 1.12 1.13 1.09  CALCIUM 8.7* 9.0 9.1 8.8* 8.6*  MG 1.9  --   --   --   --   PHOS 2.8  --   --   --   --    GFR: Estimated Creatinine  Clearance: 66.1 mL/min (by C-G formula based on SCr of 1.09 mg/dL). Recent Labs  Lab 02/01/20 0735 02/02/20 1012 02/02/20 1842 02/03/20 0333  WBC 11.1* 10.2 11.2* 13.5*   The patient is critically ill with multiple organ systems failure and requires high complexity decision making for assessment and support, frequent evaluation and titration of therapies, application of advanced monitoring technologies and extensive interpretation of multiple databases.  Independent Critical Care Time: 32 Minutes.   Rodman Pickle, M.D. [facility] Pulmonary/Critical Care Medicine 02/03/2020 11:00 AM   Please see Amion for pager number to reach on-call Pulmonary and Critical Care Team.

## 2020-02-03 NOTE — Anesthesia Post-op Follow-up Note (Signed)
  Anesthesia Lumbar Drain Follow-up Note  Patient: Scott [name]  Day #: 1  Date of Follow-up: 02/03/2020 Time: 11:59 AM  Last Vitals:  Vitals:   02/03/20 0600 02/03/20 0700  BP: (!) 145/70 (!) 146/62  Pulse: 83 85  Resp: 15 14  Temp:    SpO2: 97% 96%    Level of Consciousness: alert  Pain: Greatly improved with 2 tablets of Oxy 5mg   Lumbar Drain: Intact & functioning, mgmt per vascular   Side Effects:None  Catheter Site Exam: intact, dry, old blood at insertion site     Plan: Continue current therapy at surgeon's request  Effie Berkshire

## 2020-02-03 NOTE — Progress Notes (Addendum)
[facility] [place]  Subjective  - complains of left anterior leg pain twitching no numbness tingling or weakness in toes of feet legs   Objective (!) 146/62 85 98.2 F (36.8 C) (Oral) 14 96%  Intake/Output Summary (Last 24 hours) at 02/03/2020 0902 Last data filed at 02/03/2020 0500 Gross per 24 hour  Intake 2975.9 ml  Output 1267 ml  Net 1708.9 ml   Right groin incision clean 2+ DP pulses 2 + left femoral pulse Neuro moves UE LE 5/5 symmetric  Assessment/Planning: POD #1 s/p repair type B dissection overall doing well no back or abdominal pain  Unknown etiology of left leg pain as he has improved perfusion to this area now  Pt has history of leg cramps at home and can't tell if these are the same.  I told his daughter it is ok for her to bring in his leg cramp medication.  Will also use robaxin and check if quinine on formulary  Will need to stay in bed til Monday or Tuesday with lumbar drain.  Will follow how much is having to be drained off.  Daughter updated at bedside  Mild leukocytosis most likely reactive follow for  now  Ruta Hinds 02/03/2020 9:02 AM --  Laboratory Lab Results: Recent Labs    02/02/20 1842 02/03/20 0333  WBC 11.2* 13.5*  HGB 12.1* 12.0*  HCT 36.4* 35.4*  PLT 165 165   BMET Recent Labs    02/02/20 1012 02/03/20 0333  NA 132* 135  K 4.0 4.0  CL 100 103  CO2 21* 22  GLUCOSE 110* 149*  BUN 27* 31*  CREATININE 1.13 1.09  CALCIUM 8.8* 8.6*    COAG Lab Results  Component Value Date   INR 1.1 01/29/2020   No results found for: PTT

## 2020-02-03 NOTE — Progress Notes (Signed)
RN called into patient's room because he felt something damp on his back. Upon examination, pt's lumbar drain tubing had become fractured. Soft blue clamps used to clamp drain tubing, and sterile procedure used to secure the catheter tubing to the patient. Anesthesiologist paged to bedside.

## 2020-02-04 ENCOUNTER — Encounter (HOSPITAL_COMMUNITY): Payer: Self-pay | Admitting: Pulmonary Disease

## 2020-02-04 DIAGNOSIS — I7102 Dissection of abdominal aorta: Secondary | ICD-10-CM | POA: Diagnosis not present

## 2020-02-04 DIAGNOSIS — I714 Abdominal aortic aneurysm, without rupture: Secondary | ICD-10-CM | POA: Diagnosis not present

## 2020-02-04 MED ORDER — HYDRALAZINE HCL 50 MG PO TABS
100.0000 mg | ORAL_TABLET | Freq: Three times a day (TID) | ORAL | Status: DC
  Administered 2020-02-04 – 2020-02-06 (×6): 100 mg via ORAL
  Filled 2020-02-04 (×6): qty 2

## 2020-02-04 MED ORDER — METHOCARBAMOL 500 MG PO TABS
750.0000 mg | ORAL_TABLET | Freq: Three times a day (TID) | ORAL | Status: DC
  Administered 2020-02-05: 750 mg via ORAL
  Filled 2020-02-04: qty 2

## 2020-02-04 NOTE — Addendum Note (Signed)
Addendum  created 02/04/20 0419 by Claris Che, CRNA   LDA properties accepted

## 2020-02-04 NOTE — Progress Notes (Signed)
NAME:  Scott [name], MRN:  [id number], DOB:  [birth year], LOS: 6 ADMISSION DATE:  01/29/2020, CONSULTATION DATE: 01/29/2020 REFERRING MD: Dr. Melina Copa, CHIEF COMPLAINT: Dissecting abdominal aortic aneurysm  Brief History   69 year old male presents with complaints of 5 out of 10 mid back pain with associated bilateral leg weakness found to have small dissecting abdominal aortic aneurysm on ED evaluation.  Vascular surgery consulted at [facility] who recommended transfer to [facility].  PCCM consulted for further management and admission  History of present illness   Scott [name] is a 69 year old male with past medical history significant for hypertension who presented to the emergency department with 5 out of 10 mid back pain.  Patient reported back pain began around 5 AM and is also associated with bilateral leg weakness that is progressively worsening.  Patient denies any chest pain, abdominal pain, or bowel or bladder incontinence.  On arrival to emergency department patient was seen mildly diaphoretic with continued back pain and lower extremity weakness.  Vital signs significant hypertensive urgency with a blood pressure of 208/96.  Lab work significant for mild hyperglycemia and mild hypokalemia.  EKG with sinus rhythm and multiple PVCs.  CT angiogram chest/abdomen/pelvis obtained on arrival that revealed small dissecting abdominal aortic aneurysm.  At this time vascular surgery was consulted by [facility] emergency department physician who recommended transfer to [facility] for further evaluation.  Vascular surgery recommended critical care admit.  Of note patient reports he recently ran out of his antihypertensives and when he called his PCP for refills he was requested to present for follow up but he failed to do so.    Past Medical History  Hypertension  Significant Hospital Events   Admitted 8/10  Consults:  Vascular surgery  Procedures:  None  Significant Diagnostic Tests:  CT angio  chest/abdomen/pelvis 8/10 > Acute aortic syndrome with presence both penetrating ulcer/entry tear in the aortic arch beyond left subclavian origin   MRI spine 8/10 >  1. Degraded by motion despite repeated imaging attempts. 2. 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 Suboptimal cord detail elsewhere, although no levels of cord swelling to suggest ischemic cytotoxic edema. 3. Occasional mild thoracic spinal stenosis related to spine degeneration and/or epidural lipomatosis. Up to moderate degenerative neural foraminal stenosis at the right T3 and left T10 nerve levels.  CTA 02/02/20 - Compared to 8/11 CTA, progression of AAA with increased size of false lumen extending to left external iliac artery. Micro Data:  COVID 8/10 > Negative   Antimicrobials:  None  Interim history/subjective:  In worse spirits today, c/o diffuse pain. Poorly mobile. BP at goal.  Objective   Blood pressure 133/66, pulse 65, temperature 99 F (37.2 C), temperature source Oral, resp. rate 17, height 5' 8.5" (1.74 m), weight 86.2 kg, SpO2 95 %.        Intake/Output Summary (Last 24 hours) at 02/04/2020 0944 Last data filed at 02/04/2020 0900 Gross per 24 hour  Intake 1029.94 ml  Output 1948 ml  Net -918.06 ml   Filed Weights   02/02/20 0500 02/03/20 0500 02/04/20 0500  Weight: 81.2 kg 83.7 kg 86.2 kg   Physical Exam: General: Well-appearing, no acute distress HENT: [place], AT, OP clear, MMM Eyes: EOMI, no scleral icterus Respiratory: Clear to auscultation bilaterally.  No crackles, wheezing or rales Cardiovascular: RRR, ext warm GI: BS+, soft, nontender Extremities: no edema, warm, pulses palpatble Skin: groin sites CDI Neuro:  moves all 4 ext to command  Labs benign  Resolved Hospital Problem list     Assessment & Plan:   Dissecting abdominal aortic aneurysm s/p thoracic aortic stent graft 8/14 Hypertensive  emergency  -All improved -Increase hydralazine, continue amlodipine, labetalol -Goal SBP < 150 -Off IV drips, okay for floor -Leg cramps, mobility and pain control per vascular -Okay for PCU, will discuss with vascular whether they want TRH to be primary or vascular  Erskine Emery MD PCCM

## 2020-02-04 NOTE — Progress Notes (Addendum)
  Progress Note    02/04/2020 7:20 AM 2 Days Post-Op  Subjective:  Feels good this morning. Still having a cramping like/ soreness in left thigh. Says he has no appetite   Vitals:   02/04/20 0600 02/04/20 0700  BP: (!) 153/73 136/66  Pulse: 80 71  Resp: 18 13  Temp:    SpO2: 97% 94%   Physical Exam: Cardiac: regular Lungs: non labored Incisions: right groin incision is clean,dry and intact without swelling or hematoma Extremities:  Moving all extremities without deficits. 2+ radial pulses, 2+ femoral pulses, 2+ DP pulses bilaterally. Feet warm and well perfused Abdomen: soft, non tender Neurologic: alert and oriented  CBC    Component Value Date/Time   WBC 13.5 (H) 02/03/2020 0333   RBC 3.55 (L) 02/03/2020 0333   HGB 12.0 (L) 02/03/2020 0333   HCT 35.4 (L) 02/03/2020 0333   PLT 165 02/03/2020 0333   MCV 99.7 02/03/2020 0333   MCH 33.8 02/03/2020 0333   MCHC 33.9 02/03/2020 0333   RDW 13.1 02/03/2020 0333   LYMPHSABS 1.5 01/31/2020 0604   MONOABS 1.4 (H) 01/31/2020 0604   EOSABS 0.0 01/31/2020 0604   BASOSABS 0.0 01/31/2020 0604    BMET    Component Value Date/Time   NA 135 02/03/2020 0333   K 4.0 02/03/2020 0333   CL 103 02/03/2020 0333   CO2 22 02/03/2020 0333   GLUCOSE 149 (H) 02/03/2020 0333   BUN 31 (H) 02/03/2020 0333   CREATININE 1.09 02/03/2020 0333   CALCIUM 8.6 (L) 02/03/2020 0333   GFRNONAA >60 02/03/2020 0333   GFRAA >60 02/03/2020 0333    INR    Component Value Date/Time   INR 1.1 01/29/2020 1455     Intake/Output Summary (Last 24 hours) at 02/04/2020 0720 Last data filed at 02/04/2020 0500 Gross per 24 hour  Intake 1029.94 ml  Output 1648 ml  Net -618.06 ml     Assessment/Plan:  69 y.o. male is s/p repair of type B dissection 2 Days Post-Op. Doing well post op. No back or abdominal pain. Continues to have muscle spasms/ left thigh pain-no real improvement with robaxin or quinine. Extremities are well perfused and warm. Palpable  femoral pulses and DP pulses bilaterally. Right groin incision is clean, dry and intact. Hemodynamically stable. WBC up a little to 13.5 still suspect this is reactive. Will defer to Dr. Oneida Alar to determine timing of removal of lumbar drain    Corrina Baglia, PA-C [facility] [phone] 02/04/2020 7:20 AM   Spinal drain discontinued early this morning after second fracture No numbness or tingling in feet No cramping in thigh No back or chest pain BP less than 150 off claviprex  Transfer to 4e, ambulate,  Keep close eye on BP needs to be 100-150 at all times  Ruta Hinds, MD [facility] [place]: [phone]

## 2020-02-05 ENCOUNTER — Encounter (HOSPITAL_COMMUNITY): Payer: Self-pay | Admitting: Pulmonary Disease

## 2020-02-05 MED ORDER — SENNOSIDES-DOCUSATE SODIUM 8.6-50 MG PO TABS
2.0000 | ORAL_TABLET | Freq: Two times a day (BID) | ORAL | Status: DC
  Administered 2020-02-05 – 2020-02-06 (×2): 2 via ORAL
  Filled 2020-02-05 (×2): qty 2

## 2020-02-05 MED ORDER — LABETALOL HCL 100 MG PO TABS
100.0000 mg | ORAL_TABLET | Freq: Three times a day (TID) | ORAL | Status: DC
  Administered 2020-02-05 – 2020-02-06 (×3): 100 mg via ORAL
  Filled 2020-02-05 (×3): qty 1

## 2020-02-05 MED ORDER — MAGNESIUM HYDROXIDE 400 MG/5ML PO SUSP: 30.0000 mL | Freq: Every day | ORAL | Status: DC | PRN

## 2020-02-05 MED ORDER — CLONAZEPAM 0.25 MG PO TBDP: 0.5000 mg | ORAL_TABLET | Freq: Every day | ORAL | Status: DC

## 2020-02-05 NOTE — Discharge Instructions (Signed)
[facility]   Discharge Instructions  Thoracic aortic stent graft for repair   Please refer to the following instructions for your post-procedure care. Your surgeon or Physician Assistant will discuss any changes with you.  Activity  You are encouraged to walk as much as you can. You can slowly return to normal activities but must avoid strenuous activity and heavy lifting until your doctor tells you it's OK. Avoid activities such as vacuuming or swinging a gold club. It is normal to feel tired for several weeks after your surgery. Do not drive until your doctor gives the OK and you are no longer taking prescription pain medications. It is also normal to have difficulty with sleep habits, eating, and bowel movements after surgery. These will go away with time.  Bathing/Showering  Shower daily after you go home.  Do not soak in a bathtub, hot tub, or swim until the incision heals completely.  If you have incisions in your groin, wash the groin wounds with soap and water daily and pat dry. (No tub bath-only shower)  Then put a dry gauze or washcloth there to keep this area dry to help prevent wound infection daily and as needed.  Do not use Vaseline or neosporin on your incisions.  Only use soap and water on your incisions and then protect and keep dry.  Incision Care  Shower every day. Clean your incision with mild soap and water. Pat the area dry with a clean towel. You do not need a bandage unless otherwise instructed. Do not apply any ointments or creams to your incision. If you clothing is irritating, you may cover your incision with a dry gauze pad.  Diet  Resume your normal diet. There are no special food restrictions following this procedure. A low fat/low cholesterol diet is recommended for all patients with vascular disease. In order to heal from your surgery, it is CRITICAL to get adequate nutrition. Your body requires vitamins, minerals, and  protein. Vegetables are the best source of vitamins and minerals. Vegetables also provide the perfect balance of protein. Processed food has little nutritional value, so try to avoid this.  Medications  Resume taking all of your medications unless your doctor or nurse practitioner tells you not to. If your incision is causing pain, you may take over-the-counter pain relievers such as acetaminophen (Tylenol). If you were prescribed a stronger pain medication, please be aware these medications can cause nausea and constipation. Prevent nausea by taking the medication with a snack or meal. Avoid constipation by drinking plenty of fluids and eating foods with a high amount of fiber, such as fruits, vegetables, and grains.   Do not take Tylenol if you are taking prescription pain medications.   Follow up  [place] office will schedule a follow-up appointment with a CT scan 3-4 weeks after your surgery.  Please call us immediately for any of the following conditions  . Severe or worsening pain in your legs or feet or in your abdomen back or chest. . Increased pain, redness, drainage (pus) from your incision site. . Increased abdominal pain, bloating, nausea, vomiting or persistent diarrhea. . Fever of 101 degrees or higher. . Swelling in your leg (s), .  Reduce your risk of vascular disease  .Stop smoking. If you would like help call [facility] at 1-800-QUIT-NOW (475)862-5141) or [place] at 901-609-6669. .Manage your cholesterol .Maintain a desired weight .Control your diabetes .Keep your blood pressure down  If you have questions, please call  the office at [phone].

## 2020-02-05 NOTE — Progress Notes (Addendum)
  Progress Note    02/05/2020 9:27 AM 3 Days Post-Op  Subjective:  Patient feels good. Says he wants to go home. Appetite poor but says he cant tolerate food here. Ambulating well   Vitals:   02/05/20 0338 02/05/20 0745  BP: (!) 151/66 (!) 159/64  Pulse: 69 77  Resp: 17 19  Temp: 97.8 F (36.6 C) 97.6 F (36.4 C)  SpO2: 98% 99%   Physical Exam: Cardiac:  regular Lungs:  Non labored Incisions:  Right groin incision is clean, dry and intact without hematoma or swelling. No eccymosis Extremities:  Moving all extremities without deficits. Bilateral lower extremities well perfused and warm. Palpable 2+ femoral pulses bilaterally. 2+ DP pulses bilaterally.  Abdomen: soft, non tender Neurologic: alert and oriented  CBC    Component Value Date/Time   WBC 13.5 (H) 02/03/2020 0333   RBC 3.55 (L) 02/03/2020 0333   HGB 12.0 (L) 02/03/2020 0333   HCT 35.4 (L) 02/03/2020 0333   PLT 165 02/03/2020 0333   MCV 99.7 02/03/2020 0333   MCH 33.8 02/03/2020 0333   MCHC 33.9 02/03/2020 0333   RDW 13.1 02/03/2020 0333   LYMPHSABS 1.5 01/31/2020 0604   MONOABS 1.4 (H) 01/31/2020 0604   EOSABS 0.0 01/31/2020 0604   BASOSABS 0.0 01/31/2020 0604    BMET    Component Value Date/Time   NA 135 02/03/2020 0333   K 4.0 02/03/2020 0333   CL 103 02/03/2020 0333   CO2 22 02/03/2020 0333   GLUCOSE 149 (H) 02/03/2020 0333   BUN 31 (H) 02/03/2020 0333   CREATININE 1.09 02/03/2020 0333   CALCIUM 8.6 (L) 02/03/2020 0333   GFRNONAA >60 02/03/2020 0333   GFRAA >60 02/03/2020 0333    INR    Component Value Date/Time   INR 1.1 01/29/2020 1455     Intake/Output Summary (Last 24 hours) at 02/05/2020 8127 Last data filed at 02/04/2020 2320 Gross per 24 hour  Intake 360 ml  Output --  Net 360 ml     Assessment/Plan:  69 y.o. male is s/p repair of type B aortic dissection 3 Days Post-Op. Overall doing well. No back or abdominal pain. Continues to have left thigh pain. Bilateral legs well  perfused and warm with palpable DP. BP >150s overnight. BP medications just given. Need to keep BP tightly controlled between 100-150s. Continue to mobilize     Karoline Caldwell, [place] [facility] [phone] 02/05/2020 9:27 AM   Agree with above.  D/c home today if BP consistently less than 150 otherwise will need further adjustment by medical team.  Ruta Hinds, MD [facility] Office: [phone]

## 2020-02-05 NOTE — Evaluation (Signed)
Physical Therapy Evaluation Patient Details Name: Scott [name] MRN: [id number] DOB: 1951-02-27 Today's Date: 02/05/2020   History of Present Illness  69 year old male presents with complaints  mid back pain with associated bilateral leg weakness found to have small dissecting abdominal aortic aneurysm on ED evaluation.  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  Clinical Impression  The patient is ambulating with supervision, with and without RW. Patient reports that he will be staying with daughter. HR 72 post ambulation. PT will sign off.    Follow Up Recommendations No PT follow up    Equipment Recommendations  None recommended by PT    Recommendations for Other Services       Precautions / Restrictions Precautions Precautions: Fall      Mobility  Bed Mobility Overal bed mobility: Independent                Transfers Overall transfer level: Needs assistance   Transfers: Sit to/from Stand Sit to Stand: Supervision            Ambulation/Gait Ambulation/Gait assistance: Supervision Gait Distance (Feet): 80 Feet Assistive device: Rolling walker (2 wheeled) Gait Pattern/deviations: Step-through pattern;Drifts right/left Gait velocity: somewhat increased, cues to slow down   General Gait Details: then ambulated x 80' without RW and supervision  Stairs            Wheelchair Mobility    Modified Rankin (Stroke Patients Only)       Balance Overall balance assessment: Mild deficits observed, not formally tested                                           Pertinent Vitals/Pain Pain Assessment: No/denies pain    Home Living Family/patient expects to be discharged to:: Private residence Living Arrangements: Children Available Help at Discharge: Available PRN/intermittently Type of Home: House Home Access: Level entry     Home Layout: One level Home Equipment: Walker - 2 wheels Additional Comments: going to stay with  daughter, he thinks he has a RW.    Prior Function Level of Independence: Independent               Hand Dominance        Extremity/Trunk Assessment   Upper Extremity Assessment Upper Extremity Assessment: Defer to OT evaluation    Lower Extremity Assessment Lower Extremity Assessment: Overall WFL for tasks assessed    Cervical / Trunk Assessment Cervical / Trunk Assessment: Normal  Communication   Communication: No difficulties  Cognition Arousal/Alertness: Awake/alert Behavior During Therapy: WFL for tasks assessed/performed;Impulsive Overall Cognitive Status: Within Functional Limits for tasks assessed                                        General Comments      Exercises     Assessment/Plan    PT Assessment Patent does not need any further PT services  PT Problem List         PT Treatment Interventions      PT Goals (Current goals can be found in the Care Plan section)  Acute Rehab PT Goals Patient Stated Goal: go home today to sleep PT Goal Formulation: All assessment and education complete, DC therapy    Frequency     Barriers to discharge  Co-evaluation               AM-PAC PT "6 Clicks" Mobility  Outcome Measure Help needed turning from your back to your side while in a flat bed without using bedrails?: None Help needed moving from lying on your back to sitting on the side of a flat bed without using bedrails?: None Help needed moving to and from a bed to a chair (including a wheelchair)?: None Help needed standing up from a chair using your arms (e.g., wheelchair or bedside chair)?: A Little Help needed to walk in hospital room?: A Little Help needed climbing 3-5 steps with a railing? : A Little 6 Click Score: 21    End of Session   Activity Tolerance: Patient tolerated treatment well Patient left: in bed;with call bell/phone within reach;with bed alarm set Nurse Communication: Mobility status PT Visit  Diagnosis: Unsteadiness on feet (R26.81);Difficulty in walking, not elsewhere classified (R26.2)    Time: 9728-2060 PT Time Calculation (min) (ACUTE ONLY): 10 min   Charges:   PT Evaluation $PT Eval Low Complexity: [place] PT Acute Rehabilitation Services Pager [phone] Office [phone]   Claretha Cooper 02/05/2020, 11:48 AM

## 2020-02-05 NOTE — Progress Notes (Signed)
PROGRESS NOTE    Scott [name]  JOA:[id number] DOB: 11/04/1950 DOA: 01/29/2020 PCP: Elsie Lincoln, MD    Chief Complaint  Patient presents with   Chest Pain    Brief Narrative:  Old gentleman presents with mid back pain associated with bilateral leg weakness was found to have a small dissecting abdominal aortic aneurysm.  Vascular surgery was consulted at [facility] recommended transfer to [facility].  PCCM consulted initially for admission and he was transferred to [facility] on 02/05/2020. Patient underwent repair of the type B dissection with thoracic stent placement. Patient seen and examined this morning he reports he feels fine and does not want any pain medication and was able to ambulate to the bathroom without any issues.  PT evaluation recommended no PT follow-up at this time.  Patient's systolic blood pressure measurements are greater than 150 at this time, we have increased his labetalol to 3 times daily dosing for better blood pressure control.  Continue to monitor.  Discussed the plan with the patient at bedside and the daughter over the phone.  Assessment & Plan:   Active Problems:   Dissection of abdominal aorta ([name])   Dissecting AAA (abdominal aortic aneurysm) ([place])   Dissecting abdominal aortic aneurysm S/p thoracic aortic stent graft for repair of type B aortic dissection and postop patient had bilateral femoral pulses. Aggressive blood pressure control with systolic less than 601U.  Patient is currently on Norvasc 10 mg, hydralazine 100 mg 3 times daily, labetalol 100 mg twice daily which was increased to 100 mg 3 times daily.  And patient also has as needed IV blood pressure meds for better control.   Mild postop anemia Hemoglobin stable around 12.   Low back pain Pain control as needed.    Hypertensive urgency Appears to have improved patient currently on Norvasc, hydralazine and labetalol.    Constipation Senna Colace and MiraLAX will be ordered.   DVT  prophylaxis: scd's Code Status: full code.  Family Communication: none at bedside.  Disposition:   Status is: Inpatient  Remains inpatient appropriate because:Inpatient level of care appropriate due to severity of illness, FOR better BP control as per vascular surgery recommendations prior to discharge.    Dispo: The patient is from: Home              Anticipated d/c is to: Home              Anticipated d/c date is: 1 day              Patient currently is not medically stable to d/c.       Consultants:   Vascular surgery  PCCM.      Procedures: S/p aortic thoracic stent placement    Antimicrobials: none.    Subjective: NO NEW COMPLAINTS.   Objective: Vitals:   02/04/20 2314 02/05/20 0338 02/05/20 0745 02/05/20 1227  BP: 114/78 (!) 151/66 (!) 159/64 (!) 153/73  Pulse: 64 69 77   Resp: 14 17 19    Temp: 97.6 F (36.4 C) 97.8 F (36.6 C) 97.6 F (36.4 C)   TempSrc: Oral Oral Oral   SpO2: 96% 98% 99%   Weight:  83.6 kg    Height:        Intake/Output Summary (Last 24 hours) at 02/05/2020 1315 Last data filed at 02/04/2020 2320 Gross per 24 hour  Intake 360 ml  Output --  Net 360 ml   Filed Weights   02/03/20 0500 02/04/20 0500 02/05/20 0338  Weight:  83.7 kg 86.2 kg 83.6 kg    Examination:  General exam: Appears calm and comfortable  Respiratory system: Clear to auscultation. Respiratory effort normal. Cardiovascular system: S1 & S2 heard, RRR. Marland Kitchen No pedal edema. Gastrointestinal system: Abdomen is nondistended, soft and nontender.Normal bowel sounds heard. Central nervous system: Alert and oriented. No focal neurological deficits. Extremities: Symmetric 5 x 5 power. Skin: No rashes, lesions or ulcers Psychiatry:Mood & affect appropriate.     Data Reviewed: I have personally reviewed following labs and imaging studies  CBC: Recent Labs  Lab 01/31/20 0604 02/01/20 0735 02/02/20 1012 02/02/20 1842 02/03/20 0333  WBC 12.1* 11.1* 10.2 11.2*  13.5*  NEUTROABS 9.1*  --   --   --   --   HGB 12.5* 13.4 12.5* 12.1* 12.0*  HCT 37.7* 40.5 36.8* 36.4* 35.4*  MCV 99.0 98.5 98.7 98.9 99.7  PLT 146* 161 168 165 546    Basic Metabolic Panel: Recent Labs  Lab 01/30/20 0121 01/31/20 0604 02/01/20 0735 02/02/20 1012 02/03/20 0333  NA 138 137 135 132* 135  K 4.6 4.4 4.1 4.0 4.0  CL 106 103 101 100 103  CO2 22 26 23  21* 22  GLUCOSE 141* 124* 126* 110* 149*  BUN 18 18 22  27* 31*  CREATININE 1.00 1.06 1.12 1.13 1.09  CALCIUM 8.7* 9.0 9.1 8.8* 8.6*  MG 1.9  --   --   --   --   PHOS 2.8  --   --   --   --     GFR: Estimated Creatinine Clearance: 66.1 mL/min (by C-G formula based on SCr of 1.09 mg/dL).  Liver Function Tests: No results for input(s): AST, ALT, ALKPHOS, BILITOT, PROT, ALBUMIN in the last 168 hours.  CBG: No results for input(s): GLUCAP in the last 168 hours.   Recent Results (from the past 240 hour(s))  SARS Coronavirus 2 by RT PCR (hospital order, performed in [facility] hospital lab) Nasopharyngeal Nasopharyngeal Swab     Status: None   Collection Time: 01/29/20 10:07 AM   Specimen: Nasopharyngeal Swab  Result Value Ref Range Status   SARS Coronavirus 2 NEGATIVE NEGATIVE Final    Comment: (NOTE) SARS-CoV-2 target nucleic acids are NOT DETECTED.  The SARS-CoV-2 RNA is generally detectable in upper and lower respiratory specimens during the acute phase of infection. The lowest concentration of SARS-CoV-2 viral copies this assay can detect is 250 copies / mL. A negative result does not preclude SARS-CoV-2 infection and should not be used as the sole basis for treatment or other patient management decisions.  A negative result may occur with improper specimen collection / handling, submission of specimen other than nasopharyngeal swab, presence of viral mutation(s) within the areas targeted by this assay, and inadequate number of viral copies (<250 copies / mL). A negative result must be combined with  clinical observations, patient history, and epidemiological information.  Fact Sheet for Patients:   StrictlyIdeas.no  Fact Sheet for Healthcare Providers: BankingDealers.co.za  This test is not yet approved or  cleared by the [place] FDA and has been authorized for detection and/or diagnosis of SARS-CoV-2 by FDA under an Emergency Use Authorization (EUA).  This EUA will remain in effect (meaning this test can be used) for the duration of the COVID-19 declaration under Section 564(b)(1) of the Act, 21 U.S.C. section 360bbb-3(b)(1), unless the authorization is terminated or revoked sooner.  Performed at [facility], [address], [place], [place] [address]   MRSA PCR Screening  Status: None   Collection Time: 01/29/20 12:59 PM   Specimen: Nasal Mucosa; Nasopharyngeal  Result Value Ref Range Status   MRSA by PCR NEGATIVE NEGATIVE Final    Comment:        The GeneXpert MRSA Assay (FDA approved for NASAL specimens only), is one component of a comprehensive MRSA colonization surveillance program. It is not intended to diagnose MRSA infection nor to guide or monitor treatment for MRSA infections. Performed at [facility], [place] [address] [place], [place] [address]          Radiology Studies: No results found.      Scheduled Meds:  amLODipine  10 mg Oral Daily   Chlorhexidine Gluconate Cloth  6 each Topical Daily   clonazepam  0.5 mg Oral QHS   docusate sodium  100 mg Oral BID   famotidine  20 mg Oral BID   feeding supplement (ENSURE ENLIVE)  237 mL Oral BID BM   hydrALAZINE  100 mg Oral Q8H   labetalol  100 mg Oral TID   methocarbamol  750 mg Oral TID   pantoprazole  40 mg Oral Daily   polyethylene glycol  17 g Oral Daily   sodium chloride flush  3 mL Intravenous Q12H   Continuous Infusions:  sodium chloride     sodium chloride     magnesium sulfate bolus IVPB       LOS: 7  days        Hosie Poisson, MD [facility]   To contact the attending provider between 7A-7P or the covering provider during after hours 7P-7A, please log into the web site [email or web address] and access using universal [place] password for that web site. If you do not have the password, please call the hospital operator.  02/05/2020, 1:15 PM

## 2020-02-06 LAB — CBC WITH DIFFERENTIAL/PLATELET
Abs Immature Granulocytes: 0.13 10*3/uL — ABNORMAL HIGH (ref 0.00–0.07)
Basophils Absolute: 0 10*3/uL (ref 0.0–0.1)
Basophils Relative: 0 %
Eosinophils Absolute: 0 10*3/uL (ref 0.0–0.5)
Eosinophils Relative: 0 %
HCT: 34.9 % — ABNORMAL LOW (ref 39.0–52.0)
Hemoglobin: 11.5 g/dL — ABNORMAL LOW (ref 13.0–17.0)
Immature Granulocytes: 1 %
Lymphocytes Relative: 14 %
Lymphs Abs: 1.4 10*3/uL (ref 0.7–4.0)
MCH: 32.9 pg (ref 26.0–34.0)
MCHC: 33 g/dL (ref 30.0–36.0)
MCV: 99.7 fL (ref 80.0–100.0)
Monocytes Absolute: 1.3 10*3/uL — ABNORMAL HIGH (ref 0.1–1.0)
Monocytes Relative: 13 %
Neutro Abs: 7.1 10*3/uL (ref 1.7–7.7)
Neutrophils Relative %: 72 %
Platelets: 234 10*3/uL (ref 150–400)
RBC: 3.5 MIL/uL — ABNORMAL LOW (ref 4.22–5.81)
RDW: 13 % (ref 11.5–15.5)
WBC: 10 10*3/uL (ref 4.0–10.5)
nRBC: 0 % (ref 0.0–0.2)

## 2020-02-06 LAB — TYPE AND SCREEN
ABO/RH(D): O POS
Antibody Screen: NEGATIVE
Unit division: 0
Unit division: 0

## 2020-02-06 LAB — BPAM RBC
Blood Product Expiration Date: 202109152359
Blood Product Expiration Date: 202109152359
ISSUE DATE / TIME: 202108140546
ISSUE DATE / TIME: 202108140546
Unit Type and Rh: 5100
Unit Type and Rh: 5100

## 2020-02-06 LAB — BASIC METABOLIC PANEL
Anion gap: 9 (ref 5–15)
BUN: 24 mg/dL — ABNORMAL HIGH (ref 8–23)
CO2: 23 mmol/L (ref 22–32)
Calcium: 8.5 mg/dL — ABNORMAL LOW (ref 8.9–10.3)
Chloride: 104 mmol/L (ref 98–111)
Creatinine, Ser: 1.09 mg/dL (ref 0.61–1.24)
GFR calc Af Amer: >60 mL/min (ref >60)
GFR calc non Af Amer: >60 mL/min (ref >60)
Glucose, Bld: 118 mg/dL — ABNORMAL HIGH (ref 70–99)
Potassium: 4 mmol/L (ref 3.5–5.1)
Sodium: 136 mmol/L (ref 135–145)

## 2020-02-06 MED ORDER — HYDRALAZINE HCL 100 MG PO TABS: 100.0000 mg | ORAL_TABLET | Freq: Three times a day (TID) | ORAL | 2 refills | Status: DC

## 2020-02-06 MED ORDER — LABETALOL HCL 100 MG PO TABS: 100.0000 mg | ORAL_TABLET | Freq: Three times a day (TID) | ORAL | 3 refills | Status: DC

## 2020-02-06 MED ORDER — OXYCODONE HCL 10 MG PO TABS: 5.0000 mg | ORAL_TABLET | Freq: Four times a day (QID) | ORAL | 0 refills | Status: DC | PRN

## 2020-02-06 MED ORDER — AMLODIPINE BESYLATE 10 MG PO TABS: 10.0000 mg | ORAL_TABLET | Freq: Every day | ORAL | 1 refills | Status: DC

## 2020-02-06 NOTE — Evaluation (Signed)
Occupational Therapy Evaluation Patient Details Name: Scott [name] MRN: [id number] DOB: 11/05/1950 Today's Date: 02/06/2020    History of Present Illness 69 year old male presents with complaints  mid back pain with associated bilateral leg weakness found to have small dissecting abdominal aortic aneurysm on ED evaluation.  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   Clinical Impression   This 69 y/o male presents with the above. PTA pt reports being independent with ADL and functional mobility. Pt presents supine in bed pleasant and willing to participate in therapy session. Pt currently performing ADL and mobility tasks without AD grossly at supervision level. Educated pt re: general activity progression, energy conservation techniques and compensatory strategies for safely completing ADL and mobility tasks while reducing pain. Pt verbalizing/return demonstrating understanding throughout. Educated/encouraged continued mobility while in hospital as well as after return home as pt reports noticing fatigue with activity. Questions answered with no further acute OT needs identified at this time. Pt hopeful for d/c home today. Acute OT to sign off, thank you for this referral.     Follow Up Recommendations  No OT follow up;Supervision - Intermittent    Equipment Recommendations  None recommended by OT           Precautions / Restrictions Precautions Precautions: Fall Restrictions Weight Bearing Restrictions: No      Mobility Bed Mobility Overal bed mobility: Modified Independent             General bed mobility comments: HOB slightly elevated   Transfers Overall transfer level: Needs assistance Equipment used: None Transfers: Sit to/from Stand Sit to Stand: Supervision         General transfer comment: for safety     Balance Overall balance assessment: Mild deficits observed, not formally tested                                         ADL  either performed or assessed with clinical judgement   ADL Overall ADL's : Needs assistance/impaired Eating/Feeding: Modified independent;Sitting   Grooming: Oral care;Wash/dry face;Supervision/safety;Standing   Upper Body Bathing: Modified independent;Sitting   Lower Body Bathing: Supervison/ safety;Sit to/from stand   Upper Body Dressing : Modified independent;Sitting   Lower Body Dressing: Supervision/safety;Sit to/from stand Lower Body Dressing Details (indicate cue type and reason): donning socks via figure 4 while EOB Toilet Transfer: Supervision/safety;Ambulation Toilet Transfer Details (indicate cue type and reason): simulated via transfer to/from EOB, room level mobility without AD  Toileting- Clothing Manipulation and Hygiene: Supervision/safety;Sit to/from stand;Sitting/lateral lean       Functional mobility during ADLs: Supervision/safety General ADL Comments: discussed general energy conservation and activity progression techniques; educated in strategies to reduce pain/discomfort with mobility and ADL      Vision         Perception     Praxis      Pertinent Vitals/Pain Pain Assessment: Faces Faces Pain Scale: Hurts a little bit Pain Location: some discomfort at incision, back pain Pain Descriptors / Indicators: Discomfort;Sore Pain Intervention(s): Limited activity within patient's tolerance;Monitored during session;Repositioned     Hand Dominance     Extremity/Trunk Assessment Upper Extremity Assessment Upper Extremity Assessment: Overall WFL for tasks assessed   Lower Extremity Assessment Lower Extremity Assessment: Defer to PT evaluation   Cervical / Trunk Assessment Cervical / Trunk Assessment: Normal   Communication Communication Communication: No difficulties   Cognition Arousal/Alertness: Awake/alert Behavior During  Therapy: WFL for tasks assessed/performed;Impulsive Overall Cognitive Status: Within Functional Limits for tasks assessed                                      General Comments  VSS, daughter present end of session    Exercises     Shoulder Instructions      Home Living Family/patient expects to be discharged to:: Private residence Living Arrangements: Children Available Help at Discharge: Available PRN/intermittently Type of Home: House Home Access: Level entry     Home Layout: One level     Bathroom Shower/Tub: Occupational psychologist: Standard     Home Equipment: Environmental consultant - 2 wheels   Additional Comments: going to stay with daughter, he thinks he has a RW.      Prior Functioning/Environment Level of Independence: Independent                 OT Problem List: Decreased strength;Decreased range of motion;Decreased activity tolerance;Impaired balance (sitting and/or standing);Pain;Cardiopulmonary status limiting activity      OT Treatment/Interventions:      OT Goals(Current goals can be found in the care plan section) Acute Rehab OT Goals Patient Stated Goal: go home today to sleep OT Goal Formulation: All assessment and education complete, DC therapy  OT Frequency:     Barriers to D/C:            Co-evaluation              AM-PAC OT "6 Clicks" Daily Activity     Outcome Measure Help from another person eating meals?: None Help from another person taking care of personal grooming?: A Little Help from another person toileting, which includes using toliet, bedpan, or urinal?: A Little Help from another person bathing (including washing, rinsing, drying)?: A Little Help from another person to put on and taking off regular upper body clothing?: None Help from another person to put on and taking off regular lower body clothing?: A Little 6 Click Score: 20   End of Session Nurse Communication: Mobility status  Activity Tolerance: Patient tolerated treatment well Patient left: in bed;with call bell/phone within reach;with family/visitor  present  OT Visit Diagnosis: Other abnormalities of gait and mobility (R26.89)                Time: 6153-7943 OT Time Calculation (min): 12 min Charges:  OT General Charges $OT Visit: 1 Visit OT Evaluation $OT Eval Moderate Complexity: 1 Mod  Lou Cal, OT Acute Rehabilitation Services Pager [phone] Office [phone]   Raymondo Band 02/06/2020, 10:21 AM

## 2020-02-06 NOTE — Plan of Care (Signed)
  Problem: Education: Goal: Knowledge of General Education information will improve Description: Including pain rating scale, medication(s)/side effects and non-pharmacologic comfort measures Outcome: Adequate for Discharge   

## 2020-02-06 NOTE — Discharge Summary (Signed)
Physician Discharge Summary  Scott [name] JJO:[id number] DOB: 06-09-1951 DOA: 01/29/2020  PCP: Elsie Lincoln, MD  Admit date: 01/29/2020 Discharge date: 02/06/2020  Admitted From: Home.  Disposition: Home.   Recommendations for Outpatient Follow-up:  1. Follow up with PCP in 1-2 weeks 2. Please obtain BMP/CBC in one week Please follow up with vascular surgery as recommended.    Discharge Condition:Guarded.  CODE STATUS:full code.  Diet recommendation: Heart Healthy   Brief/Interim Summary:  69 year old  gentleman presents with mid back pain associated with bilateral leg weakness was found to have a small dissecting abdominal aortic aneurysm.  Vascular surgery was consulted at [facility] recommended transfer to [facility].  PCCM consulted initially for admission and he was transferred to [facility] on 02/05/2020. Patient underwent repair of the type B dissection with thoracic stent placement. Patient seen and examined this morning he reports he feels fine and does not want any pain medication and was able to ambulate to the bathroom without any issues.  PT evaluation recommended no PT follow-up at this time.  Patient's systolic blood pressure measurements are greater than 150 at this time, we have increased his labetalol to 3 times daily dosing for better blood pressure control.     Discharge Diagnoses:  Active Problems:   Dissection of abdominal aorta ([name])   Dissecting AAA (abdominal aortic aneurysm) ([place])   Dissecting abdominal aortic aneurysm S/p thoracic aortic stent graft for repair of type B aortic dissection and postop patient had bilateral femoral pulses. Aggressive blood pressure control with systolic less than 160F.  Patient is currently on Norvasc 10 mg, hydralazine 100 mg 3 times daily, labetalol 100 mg twice daily which was increased to 100 mg 3 times daily.     Mild postop anemia Hemoglobin stable around 12.   Low back pain Pain control as  needed.    Hypertensive urgency Appears to have improved patient currently on Norvasc, hydralazine and labetalol.    Constipation Senna Colace and MiraLAX will be ordered.   Discharge Instructions  Discharge Instructions    Diet - low sodium heart healthy   Complete by: As directed    Discharge instructions   Complete by: As directed    Please follow up with PCP in one week.   No wound care   Complete by: As directed      Allergies as of 02/06/2020   No Known Allergies     Medication List    STOP taking these medications   ALPRAZolam 0.25 MG tablet Commonly known as: XANAX   cephALEXin 500 MG capsule Commonly known as: KEFLEX   famotidine 20 MG tablet Commonly known as: PEPCID     TAKE these medications   amLODipine 10 MG tablet Commonly known as: NORVASC Take 1 tablet (10 mg total) by mouth daily.   GLUCOSAMINE HCL PO Take 1 tablet by mouth in the morning and at bedtime.   hydrALAZINE 100 MG tablet Commonly known as: APRESOLINE Take 1 tablet (100 mg total) by mouth every 8 (eight) hours.   IBS SUPPORT PO Take 1 tablet by mouth in the morning and at bedtime.   labetalol 100 MG tablet Commonly known as: NORMODYNE Take 1 tablet (100 mg total) by mouth 3 (three) times daily.   Oxycodone HCl 10 MG Tabs Take 0.5 tablets (5 mg total) by mouth every 6 (six) hours as needed for moderate pain.            Durable Medical Equipment  (From admission, onward)  Start     Ordered   02/06/20 1127  For home use only DME Walker rolling  Once       Question Answer Comment  Walker: With 5 Inch Wheels   Patient needs a walker to treat with the following condition Post-operative state      02/06/20 1127          Follow-up Information    Fields, Jessy Oto, MD Follow up.   Specialties: Vascular Surgery, Cardiology Why: 3-4 weeks. The office will contact the patient with their follow up appointment Contact information: [place] [address] [phone]        [name], MD. Go on 02/14/2020.   Specialty: Cardiology Why: Hypertension Clinic referral- appointment at 3:30- please arrive 15 min early and wear a mask.  Contact information: [address] [place] [address] [phone]              No Known Allergies  Consultations:  Vascular surgery.    Procedures/Studies: MR THORACIC SPINE WO CONTRAST  Result Date: 01/29/2020 CLINICAL DATA:  69 year old male with recently discovered abnormal thoracic aorta, type B dissection versus penetrating ulcer with extensive intramural hematoma. Unexplained left leg numbness and weakness. Query thoracic cord infarct. EXAM: MRI THORACIC SPINE WITHOUT CONTRAST TECHNIQUE: Multiplanar, multisequence MR imaging of the thoracic spine was performed. No intravenous contrast was administered. COMPARISON:  CTA chest abdomen and pelvis earlier today. FINDINGS: The patient declined anxiolysis medication prior to scanning, and the study is intermittently degraded by motion artifact despite repeated imaging attempts. Limited cervical spine imaging: Multilevel cervical disc and endplate degeneration although no high-grade cervical spinal stenosis is suspected. Thoracic spine segmentation:  Normal on the comparison CTA. Alignment: Stable thoracic kyphosis with mild underlying thoracic scoliosis from the recent CTA. No spondylolisthesis. Vertebrae: No marrow edema or evidence of acute osseous abnormality. Visualized bone marrow signal is within normal limits. And L1 benign vertebral hemangioma is incidentally noted (series 12, image 18). 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 This is in proximity to bulky T4 posterior element hypertrophy which was also demonstrated by CT (see below). Limited thoracic cord detail otherwise due to  motion, but these images do not suggest any swelling of the spinal cord as could be seen with acute cytotoxic edema. The conus medullaris appears normal at L1. Paraspinal and other soft tissues: Abnormal thoracic aorta, much less apparent on these images. No pleural or pericardial effusion is evident. Negative visible upper abdominal viscera. Thoracic paraspinal soft tissues appear normal. Disc levels: There is a degree of thoracic epidural lipomatosis most pronounced at the T5-T6 level (series 15, image 18). Most levels demonstrate relatively mild for age thoracic spinal stenosis, with the following exceptions: T3-T4: No significant disc degeneration but moderate posterior element hypertrophy. Moderate right T3 foraminal stenosis. T4-T5: No significant disc degeneration but bulky calcified ligament flavum hypertrophy (series 12, image 18) also seen on CT yesterday. Subsequent mild spinal stenosis. No spinal cord mass effect. T5-T6: Moderate posterior element hypertrophy greater on the right. Mild right T5 foraminal stenosis. T7-T8: Disc space loss with mild circumferential disc bulge. Mild posterior element hypertrophy. Mild spinal stenosis when combined with epidural lipomatosis. No cord mass effect. T10-T11: Mild disc bulge and moderate posterior element hypertrophy. Mild spinal stenosis, no cord mass effect. Mild to moderate left T10 foraminal stenosis. IMPRESSION: 1. Degraded by motion despite  repeated imaging attempts. 2. 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 Suboptimal cord detail elsewhere, although no levels of cord swelling to suggest ischemic cytotoxic edema. 3. Occasional mild thoracic spinal stenosis related to spine degeneration and/or epidural lipomatosis. Up to moderate degenerative neural foraminal stenosis at the right T3 and left T10 nerve levels. Study was discussed by telephone with Dr. Harold Barban on 01/29/2020 at 22:06. We  discussed that if symptoms persist or if expert neurologic exam is suggestive of acute thoracic myelopathy then a repeat thoracic MRI could be pursued (ideally with anxiolysis or sedation meds). Electronically Signed   By: Genevie Ann M.D.   On: 01/29/2020 22:20   DG Chest Port 1 View  Result Date: 02/02/2020 CLINICAL DATA:  Post surgery. EXAM: PORTABLE CHEST 1 VIEW COMPARISON:  01/30/2020 FINDINGS: Right IJ central venous catheter has tip likely in the region of the cavoatrial junction although tip is difficult to visualize. Interval placement of aortic stent graft involving the majority of the descending thoracic aorta likely beginning over the posterior arch. Lungs are hypoinflated with subtle prominence of the central perihilar vessels may be due to mild degree of vascular congestion. No significant effusion or pneumothorax. Mild stable cardiomegaly. Remainder of the exam is unchanged. IMPRESSION: 1. Interval placement of aortic stent graft. 2. Mild stable cardiomegaly with suggestion of minimal vascular congestion. 3. Right IJ central venous catheter with tip likely over the cavoatrial junction although tip is difficult to visualize. Electronically Signed   By: Marin Olp M.D.   On: 02/02/2020 16:55   DG Chest Port 1 View  Result Date: 01/30/2020 CLINICAL DATA:  Chest pain. EXAM: PORTABLE CHEST 1 VIEW COMPARISON:  01/29/2020 FINDINGS: The cardiac silhouette, mediastinal and hilar contours are within normal limits given the AP projection and portable technique. Mild tortuosity and ectasia of the thoracic aorta in patient with known intramural hematoma based on prior CT scan. The lungs are clear of an acute process. IMPRESSION: No acute cardiopulmonary findings. Electronically Signed   By: Marijo Sanes M.D.   On: 01/30/2020 09:22   DG Chest Portable 1 View  Result Date: 01/29/2020 CLINICAL DATA:  Chest pain EXAM: PORTABLE CHEST 1 VIEW COMPARISON:  None FINDINGS: Lungs are clear. Heart is mildly  enlarged with pulmonary vascularity normal. No adenopathy. No pneumothorax. No bone lesions. IMPRESSION: Heart mildly enlarged.  No edema or airspace opacity. Electronically Signed   By: Lowella Grip III M.D.   On: 01/29/2020 07:49   ECHOCARDIOGRAM COMPLETE  Result Date: 01/29/2020    ECHOCARDIOGRAM REPORT   Patient Name:   OMRAN KEELIN Date of Exam: 01/29/2020 Medical Rec #:  [id number]  Height:       68.5 in Accession #:    [id number] Weight:       165.0 lb Date of Birth:  [birth year]  BSA:          1.894 m Patient Age:    69 years   BP:           126/65 mmHg Patient Gender: M          HR:           64 bpm. Exam Location:  Inpatient Procedure: 2D Echo Indications:    preoperative evaluation  History:        Patient has no prior history of Echocardiogram examinations.  COPD; Risk Factors:Hypertension.  Sonographer:    Jannett Celestine RDCS (AE) Referring Phys: [place]  1. Left ventricular ejection fraction, by estimation, is 50 to 55%. The left ventricle has low normal function. The left ventricle demonstrates regional wall motion abnormalities (see scoring diagram/findings for description). There is moderate concentric left ventricular hypertrophy. Left ventricular diastolic parameters are consistent with Grade I diastolic dysfunction (impaired relaxation). Elevated left atrial pressure. There is moderate hypokinesis of the left ventricular, basal-mid inferolateral wall.  2. Right ventricular systolic function is normal. The right ventricular size is normal. Tricuspid regurgitation signal is inadequate for assessing PA pressure.  3. Left atrial size was moderately dilated.  4. The mitral valve is normal in structure. No evidence of mitral valve regurgitation.  5. The aortic valve is tricuspid. Aortic valve regurgitation is mild to moderate. Mild aortic valve sclerosis is present, with no evidence of aortic valve stenosis. FINDINGS  Left Ventricle: Left ventricular ejection  fraction, by estimation, is 50 to 55%. The left ventricle has low normal function. The left ventricle demonstrates regional wall motion abnormalities. Moderate hypokinesis of the left ventricular, basal-mid inferolateral wall. The left ventricular internal cavity size was normal in size. There is moderate concentric left ventricular hypertrophy. Left ventricular diastolic parameters are consistent with Grade I diastolic dysfunction (impaired relaxation). Elevated left atrial pressure. Right Ventricle: The right ventricular size is normal. No increase in right ventricular wall thickness. Right ventricular systolic function is normal. Tricuspid regurgitation signal is inadequate for assessing PA pressure. Left Atrium: Left atrial size was moderately dilated. Right Atrium: Right atrial size was normal in size. Pericardium: There is no evidence of pericardial effusion. Mitral Valve: The mitral valve is normal in structure. Mild mitral annular calcification. No evidence of mitral valve regurgitation. Tricuspid Valve: The tricuspid valve is normal in structure. Tricuspid valve regurgitation is not demonstrated. Aortic Valve: The aortic valve is tricuspid. Aortic valve regurgitation is mild to moderate. Mild aortic valve sclerosis is present, with no evidence of aortic valve stenosis. Aortic valve mean gradient measures 7.0 mmHg. Aortic valve peak gradient measures 15.2 mmHg. Aortic valve area, by VTI measures 2.88 cm. Pulmonic Valve: The pulmonic valve was grossly normal. Pulmonic valve regurgitation is not visualized. Aorta: The aortic root and ascending aorta are structurally normal, with no evidence of dilitation. IAS/Shunts: No atrial level shunt detected by color flow Doppler.  LEFT VENTRICLE PLAX 2D LVIDd:         5.60 cm  Diastology LVIDs:         3.50 cm  LV e' lateral:   5.77 cm/s LV PW:         1.50 cm  LV E/e' lateral: 14.5 LV IVS:        1.70 cm  LV e' medial:    4.03 cm/s LVOT diam:     2.20 cm  LV E/e'  medial:  20.8 LV SV:         106 LV SV Index:   56 LVOT Area:     3.80 cm  RIGHT VENTRICLE RV S prime:     17.00 cm/s TAPSE (M-mode): 2.4 cm LEFT ATRIUM             Index       RIGHT ATRIUM           Index LA diam:        4.40 cm 2.32 cm/m  RA Area:     17.20 cm LA Vol (A2C):  64.2 ml 33.90 ml/m RA Volume:   47.10 ml  24.87 ml/m LA Vol (A4C):   49.6 ml 26.19 ml/m LA Biplane Vol: 60.1 ml 31.74 ml/m  AORTIC VALVE AV Area (Vmax):    2.83 cm AV Area (Vmean):   2.88 cm AV Area (VTI):     2.88 cm AV Vmax:           195.00 cm/s AV Vmean:          125.000 cm/s AV VTI:            0.367 m AV Peak Grad:      15.2 mmHg AV Mean Grad:      7.0 mmHg LVOT Vmax:         145.00 cm/s LVOT Vmean:        94.600 cm/s LVOT VTI:          0.278 m LVOT/AV VTI ratio: 0.76  AORTA Ao Root diam: 4.30 cm MITRAL VALVE MV Area (PHT): 2.30 cm     SHUNTS MV Decel Time: 330 msec     Systemic VTI:  0.28 m MV E velocity: 83.70 cm/s   Systemic Diam: 2.20 cm MV A velocity: 131.00 cm/s MV E/A ratio:  0.64 Mihai Croitoru MD Electronically signed by Sanda Klein MD Signature Date/Time: 01/29/2020/2:04:24 PM    Final    CT Angio Chest/Abd/Pel for Dissection W and/or W/WO  Result Date: 02/02/2020 CLINICAL DATA:  Follow-up aortic dissection, history of increasing back pain EXAM: CT ANGIOGRAPHY CHEST, ABDOMEN AND PELVIS TECHNIQUE: Non-contrast CT of the chest was initially obtained. Multidetector CT imaging through the chest, abdomen and pelvis was performed using the standard protocol during bolus administration of intravenous contrast. Multiplanar reconstructed images and MIPs were obtained and reviewed to evaluate the vascular anatomy. CONTRAST:  163mL OMNIPAQUE IOHEXOL 350 MG/ML SOLN COMPARISON:  01/30/2020, 01/29/2020 FINDINGS: CTA CHEST FINDINGS Cardiovascular: Initial precontrast images demonstrate no new hyperdense crescent to suggest acute aortic injury. The previously seen crescent is less prominent on the current exam. Following  contrast enhancement there is adequate visualization of the thoracic aorta. Mild prominence of the ascending aorta to 4 cm is noted. This is similar to that seen on the prior exam. Just beyond the origin of the left subclavian artery there is evidence of intramural hematoma and dissection similar to that seen on prior exam. Some minimal contrast enhancement of the intramural hematoma is noted proximally best seen on image number 52 of series 7. This is new from the prior exam. The descending thoracic aorta again demonstrates the dissection flap which extends inferiorly into the abdominal aorta. This dissection flap is stable in appearance when compared with the prior CT of 3 days previous. Heart is at the upper limits of normal in size. No pulmonary emboli are noted. Mediastinum/Nodes: Esophagus is within normal limits. No hilar or mediastinal adenopathy is noted. The thoracic inlet is unremarkable. Lungs/Pleura: The lungs are well aerated bilaterally. No focal infiltrate is seen. Small left-sided pleural effusion is noted new from the prior study. Musculoskeletal: Degenerative changes of the thoracic spine are noted. Review of the MIP images confirms the above findings. CTA ABDOMEN AND PELVIS FINDINGS VASCULAR Aorta: The distal descending thoracic aortic dissection extends inferiorly into the abdominal aorta. There is however increasing size of the false lumen when compared with the prior exam and significant compression upon the infrarenal aorta with significant narrowing of the true lumen identified. At the level of the bifurcation the degree of compression on the true  lumen is alleviated although the dissection flap extends into the left common iliac artery and subsequently into the internal and external iliac arteries on the left. There is increasing compression on the true lumen in both the common and external iliac arteries with decreased flow identified. The degree of stenosis in the left external iliac  arteries is significant with slit like lumen residual. Celiac: Celiac axis arises from the true lumen and is widely patent. SMA: Also arises from the true lumen and widely patent. Renals: Right renal artery arises from the true lumen and is widely patent. Two renal arteries on the left are identified. The more superior renal artery arises from the true lumen in the more inferior renal artery arises from the both the true and false lumen. Some stenosis is again identified at the origin of the inferior left renal artery. IMA: The IMA arises from the true lumen and is patent despite the relative collapse of the infrarenal aorta due to the increased level of dissection. Iliacs: As described above the right iliac artery arises from the true lumen and is widely patent. The left iliac artery arises from a combination of both the true and false lumen. Dissection again causes significant stenosis of the left common iliac artery. This now however extends further into the external iliac artery with increasing compression of the true lumen. Veins: No specific venous abnormality is noted. Review of the MIP images confirms the above findings. NON-VASCULAR Hepatobiliary: Vicarious excretion of contrast is noted within the gallbladder. Cyst is seen within left lobe of the liver stable in appearance. Liver is otherwise within normal limits. Pancreas: Fatty interdigitation of the pancreas is noted. No focal mass is noted. Spleen: Spleen is within normal limits. Adrenals/Urinary Tract: Adrenal glands are unremarkable. Kidneys demonstrate a normal enhancement pattern. Exophytic cyst is noted in the right kidney stable in appearance. No renal calculi are seen. The bladder is within normal limits. Stomach/Bowel: In the sigmoid colon there is a focal area of luminal narrowing identified best seen on image number 256 of series 7. This is likely related to spasm although the possibility of an underlying lesion could not be totally excluded.  Diverticular changes noted. The more proximal colon appears within normal limits. The appendix is not well appreciated. No inflammatory changes to suggest appendicitis are noted. Small bowel and stomach appear within normal limits. Lymphatic: No significant lymphadenopathy is noted. Reproductive: Prostate is unremarkable. Other: No abdominal wall hernia or abnormality. No abdominopelvic ascites. Musculoskeletal: No acute or significant osseous findings. Review of the MIP images confirms the above findings. IMPRESSION: Progression of the known abdominal aortic dissection with increasing size of the false lumen causing significant compression upon the true lumen when compared with the exam from 3 days previous. This extends further into the left external iliac artery when compared with the prior exam with luminal compromise of at least 75-80% of the distal left external iliac artery. The more proximal portion of the dissection is stable with the exception of a small area of enhancement intramural hematoma along the medial aspect of the proximal descending thoracic aorta. This is best visualized on image number 52 of series 7. Dilatation of the ascending aorta to 4 cm Recommend annual imaging followup by CTA or MRA. This recommendation follows 2010 ACCF/AHA/AATS/ACR/ASA/SCA/SCAI/SIR/STS/SVM Guidelines for the Diagnosis and Management of Patients with Thoracic Aortic Disease. Circulation. 2010; 121: H476-L465. Aortic aneurysm NOS (ICD10-I71.9) Area of narrowing within the sigmoid colon which is likely related to spasm although the possibility of  an underlying lesion could not be totally excluded given the appearance. Further workup can be performed when clinically able. Critical Value/emergent results were called by telephone at the time of interpretation on 02/02/2020 at 10:40 am to provider Ruta Hinds, MD, who verbally acknowledged these results. Electronically Signed   By: Inez Catalina M.D.   On: 02/02/2020 10:41    CT Angio Chest/Abd/Pel for Dissection W and/or W/WO  Addendum Date: 01/30/2020   ADDENDUM REPORT: 01/30/2020 21:52 ADDENDUM: These results were called by telephone at the time of interpretation on 01/30/2020 at 9:52 pm to provider Harold Barban , who verbally acknowledged these results. Electronically Signed   By: Constance Holster M.D.   On: 01/30/2020 21:52   Result Date: 01/30/2020 CLINICAL DATA:  Dissection follow-up EXAM: CT ANGIOGRAPHY CHEST, ABDOMEN AND PELVIS TECHNIQUE: Non-contrast CT of the chest was initially obtained. Multidetector CT imaging through the chest, abdomen and pelvis was performed using the standard protocol during bolus administration of intravenous contrast. Multiplanar reconstructed images and MIPs were obtained and reviewed to evaluate the vascular anatomy. CONTRAST:  169mL OMNIPAQUE IOHEXOL 350 MG/ML SOLN COMPARISON:  01/29/2020 FINDINGS: CTA CHEST FINDINGS Cardiovascular: Again noted is an acute type B intramural hematoma originating from the proximal descending thoracic aorta as before. The previously demonstrated penetrating ulcer appears to have resolved/thrombosed. The degree of intramural hematoma appears to be essentially stable from prior study. There is a new descending thoracic aortic aneurysm involving the distal descending thoracic aorta above the hiatus. This dissection flap extends into the abdominal aorta. A small amount of intramural hematoma is noted coursing superiorly along the proximal left subclavian artery, stable from prior study. The descending thoracic aorta is relatively stable in diameter measuring approximately 3.9 cm (previously measuring the same. The maximal thickness of the intramural hematoma involving the descending thoracic aorta is 9.7 mm (previously measuring 10 mm). Again noted is a ascending thoracic aortic aneurysm measuring approximately 4 cm (axial series 7, image 54). The sinus of Valsalva peers to be dilated measuring up to  approximately 4.2 cm. The heart remains enlarged without evidence for significant pericardial effusion. There is no acute pulmonary embolism. Mediastinum/Nodes: -- No mediastinal lymphadenopathy. -- No hilar lymphadenopathy. -- No axillary lymphadenopathy. -- No supraclavicular lymphadenopathy. -- Normal thyroid gland where visualized. -  Unremarkable esophagus. Lungs/Pleura: Airways are patent. No pleural effusion, lobar consolidation, pneumothorax or pulmonary infarction. Musculoskeletal: No chest wall abnormality. No bony spinal canal stenosis. Review of the MIP images confirms the above findings. CTA ABDOMEN AND PELVIS FINDINGS VASCULAR Aorta: There is a new aortic dissection extending into the abdominal aorta as described above. The dissection flap appears to extend into the left common iliac artery. Celiac: The celiac axis is widely patent and arises from the true lumen. SMA: The SMA arises from the true lumen and is widely patent. There is variant hepatic arterial anatomy with a replaced common hepatic artery rising from the SMA. Renals: The right renal artery is patent and arises from the true lumen. There are 2 left renal arteries. The superior most left renal artery arises from the true lumen. The inferior left renal artery also appears to be rising from the true lumen but there is a high-grade stenosis at its origin (axial series 7, image 146). IMA: The IMA is widely patent. Inflow: The right common iliac artery is patent with atherosclerotic changes noted. There is a new high-grade stenosis of the left common iliac artery (axial series 7, image 215). There is a high-grade stenosis at  the origin of the left internal iliac artery. There is a filling defect throughout the left external iliac artery resulting in moderate stenosis. The left CFA demonstrates approximately 50% narrowing. Veins: No obvious venous abnormality within the limitations of this arterial phase study. Review of the MIP images confirms  the above findings. NON-VASCULAR Hepatobiliary: The liver is normal. Normal gallbladder.There is no biliary ductal dilation. Pancreas: Normal contours without ductal dilatation. No peripancreatic fluid collection. Spleen: Unremarkable. Adrenals/Urinary Tract: --Adrenal glands: Unremarkable. --Right kidney/ureter: There is an indeterminate exophytic cystic structure arising from the interpolar region of the right kidney measuring approximately 2.5 cm. There is no hydronephrosis. --Left kidney/ureter: No hydronephrosis or radiopaque kidney stones. --Urinary bladder: Unremarkable. Stomach/Bowel: --Stomach/Duodenum: No hiatal hernia or other gastric abnormality. Normal duodenal course and caliber. --Small bowel: Unremarkable. --Colon: There are few scattered colonic diverticula without CT evidence for diverticulitis. --Appendix: Normal. Lymphatic: --No retroperitoneal lymphadenopathy. --No mesenteric lymphadenopathy. --No pelvic or inguinal lymphadenopathy. Reproductive: Unremarkable Other: No ascites or free air. The abdominal wall is normal. Musculoskeletal. There are multilevel degenerative changes throughout the visualized lumbar spine without evidence for an acute compression fracture. Review of the MIP images confirms the above findings. IMPRESSION: 1. 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 2. 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 Findings may be secondary to an intramural hematoma or intraluminal thrombus. 3. No evidence for an end organ perfusion abnormality within the abdomen. However, there is a high-grade stenosis at the origin of the accessory left renal artery supplying the lower pole of the left kidney. 4. Stable type B acute intramural hematoma involving the  thoracic aorta. The previously demonstrated penetrating ulcer appears to have thrombosed. 5. 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 6. Indeterminate exophytic cyst arising from the interpolar region of the right kidney. This is favored to represent a proteinaceous or hemorrhagic cyst. Follow-up with a nonemergent outpatient renal ultrasound is recommended. 7. Additional chronic findings as detailed above. Aortic Atherosclerosis (ICD10-I70.0). Electronically Signed: By: Constance Holster M.D. On: 01/30/2020 21:47   CT Angio Chest/Abd/Pel for Dissection W and/or W/WO  Result Date: 01/29/2020 CLINICAL DATA:  69 year old male with a history of chest pain EXAM: CT ANGIOGRAPHY CHEST, ABDOMEN AND PELVIS TECHNIQUE: Multidetector CT imaging through the chest, abdomen and pelvis was performed using the standard protocol during bolus administration of intravenous contrast. Multiplanar reconstructed images and MIPs were obtained and reviewed to evaluate the vascular anatomy. CONTRAST:  140mL OMNIPAQUE IOHEXOL 350 MG/ML SOLN COMPARISON:  None. FINDINGS: CTA CHEST FINDINGS Cardiovascular: Heart: Borderline enlarged heart. No pericardial fluid/thickening. Calcifications of the left anterior descending coronary artery. Aorta: No significant aortic valve calcifications. Diameter of the ascending aorta measures 4.2 cm. Three vessel arch without significant atherosclerotic changes of the branch vessels. Proximal common carotid arteries are patent. Right vertebral artery is patent. The origin of the left vertebral artery is best seen on the coronal images, beyond the inflection point of the left subclavian artery at the inlet. Artery is patent at the origin with some redundancy at the base of the neck. Intramural hematoma is present within the distal aortic arch extending from the left subclavian artery origin through the aortic arch and distal thoracic aorta below the  diaphragm. The intramural hematoma is manifested on the noncontrast CT  as a hyperdense crescent sign. Thickness is estimated approximately 10 mm. Diameter of the zone 3 aorta is estimated 40 mm on image 34. Diameter of zone 4 is estimated 39 mm on image 39 of series 4. Contrast focus within the intramural hematoma in the zone 3 aorta on image 29 of series 4 compatible with penetrating ulcer in a region of calcified plaque. Diameter of the aorta at the hiatus measures 3.1 cm. Pulmonary arteries: Timing of the contrast bolus is not optimized for the pulmonary arteries. However common no central filling defects present within the proximal pulmonary arteries. Mediastinum/Nodes: No mediastinal hematoma. Small lymph nodes are present. Unremarkable thoracic inlet. Unremarkable appearance of the thoracic esophagus. Lungs/Pleura: Central airways are clear. No pleural effusion. No confluent airspace disease. No pneumothorax. CTA ABDOMEN AND PELVIS FINDINGS VASCULAR Aorta: The intramural hematoma extends from the distal thoracic aorta through the aortic hiatus, and appears to terminate just above the right renal artery in the suprarenal aorta. Diameter at the renal arteries is estimated 2.5 cm. Diameter just above the bifurcation is estimated 2.2 cm. Mild atherosclerosis of the abdominal aorta. No periaortic fluid or inflammatory changes. Celiac: Celiac artery patent without significant atherosclerosis. SMA: SMA patent without significant atherosclerosis. Renals: - Right: Main right renal artery which is patent and mild atherosclerosis. There is a very small accessory right renal artery to the lower pole segment, of which the origin is not identified (with the artery visualized on image 116 of series 4). - Left: The main left renal artery is patent without significant atherosclerosis. There is accessory left renal artery from the 4 o'clock position, just below the main left renal artery. IMA: Inferior mesenteric artery is  patent. Right lower extremity: Unremarkable caliber, and contour of the right iliac system. No aneurysm, dissection, or occlusion. Mild atherosclerosis with mild tortuosity. Hypogastric artery is patent. Common femoral artery patent, with high bifurcation. Proximal SFA and profunda femoris patent. Left lower extremity: Unremarkable caliber, and contour of the left iliac system. No aneurysm, dissection, or occlusion. Mild atherosclerosis of the left iliac system with mild tortuosity. Hypogastric artery is patent. Common femoral artery patent. Proximal SFA and profunda femoris patent. Veins: Unremarkable appearance of the venous system. Review of the MIP images confirms the above findings. NON-VASCULAR Hepatobiliary: Low-density/cystic structure within the superior left liver, segment 4 a, 3.0 cm and most likely a benign cyst. No comparison available. Otherwise unremarkable liver. Unremarkable gall bladder. Pancreas: Unremarkable. Spleen: Unremarkable. Adrenals/Urinary Tract: - Right adrenal gland: Unremarkable - Left adrenal gland: Unremarkable. - Right kidney: No hydronephrosis, nephrolithiasis, inflammation, or ureteral dilation. Low-density lesion on the lateral cortex of the right kidney, most likely benign Bosniak 1 cyst. - Left Kidney: No hydronephrosis, nephrolithiasis, inflammation, or ureteral dilation. No focal lesion. - Urinary Bladder: Unremarkable. Stomach/Bowel: - Stomach: Unremarkable. - Small bowel: Unremarkable - Appendix: Appendix is not visualized, however, no inflammatory changes are present adjacent to the cecum to indicate an appendicitis. - Colon: Colonic diverticular disease. No focal inflammatory changes. Lymphatic: No adenopathy. Mesenteric: No free fluid or air. No mesenteric adenopathy. Reproductive: Transverse diameter of the prostate measures 41 mm. Other: The right aspect of the lower urinary bladder encroaches upon a fat containing right inguinal hernia. Musculoskeletal: Degenerative  changes of the lower cervical spine which is visualized. Degenerative changes of the thoracic and lumbar spine. The greatest a gray of narrowing in the thoracic region is at the level of T4 where there is facet spurring posteriorly, narrowing the canal to 7 mm. Degenerative changes  throughout the lumbar spine contributes to endplate changes, Schmorl's nodes, anterior osteophyte production. Vacuum disc phenomenon present at L2-L3, L3-L4, L4-L5. Degenerative changes of the hips. There is a lucent/hypodense focus within the central portion of the L4 vertebral body. No other lucent foci identified. No acute fracture identified. IMPRESSION: CT angiogram is positive for acute aortic syndrome, with presence of both penetrating ulcer/entry tear in the aortic arch beyond left subclavian artery origin (zone 3), and intramural hematoma extending from the origin of the left subclavian artery through the distal thoracic aorta and suprarenal abdominal aorta. Intramural hematoma appears to terminate just above the right renal artery. The above preliminary results were discussed by telephone at the time of interpretation on 01/29/2020 at 10:05 am with Dr. Aletta Edouard. Aortic atherosclerosis and associated coronary artery disease. Mild iliac arterial disease. Aortic Atherosclerosis (ICD10-I70.0). Ascending aorta measures 42 mm. Recommend annual imaging followup by CTA or MRA. This recommendation follows 2010 ACCF/AHA/AATS/ACR/ASA/SCA/SCAI/SIR/STS/SVM Guidelines for the Diagnosis and Management of Patients with Thoracic Aortic Disease. Circulation. 2010; 121: R604-V409 There is a single lucent/hypodense lesion of the L4 vertebral body, with no comparison studies. While metastatic disease is unlikely given the single lesion, this cannot be excluded and correlation with any known history of malignancy may be useful. Additional ancillary findings as above. Signed, Dulcy Fanny. Dellia Nims, RPVI [facility] Rad[facility] Electronically Signed   By: Corrie Mckusick D.O.   On: 01/29/2020 11:45   HYBRID OR IMAGING ([facility] ONLY)  Result Date: 02/02/2020 There is no interpretation for this exam.  This order is for images obtained during a surgical procedure.  Please See "Surgeries" Tab for more information regarding the procedure.       Subjective: No chest pain or sob.   Discharge Exam: Vitals:   02/06/20 0425 02/06/20 0756  BP: (!) 162/85 (!) 160/72  Pulse: 70 64  Resp: 16 13  Temp: 98.4 F (36.9 C) 98.2 F (36.8 C)  SpO2: 97% 96%   Vitals:   02/05/20 2027 02/06/20 0011 02/06/20 0425 02/06/20 0756  BP: (!) 147/60 (!) 139/58 (!) 162/85 (!) 160/72  Pulse: 63 64 70 64  Resp: 15 17 16 13   Temp: 97.8 F (36.6 C) 98.6 F (37 C) 98.4 F (36.9 C) 98.2 F (36.8 C)  TempSrc: Oral Oral Oral Oral  SpO2: 97% 96% 97% 96%  Weight:   81.7 kg   Height:        General: Pt is alert, awake, not in acute distress Cardiovascular: RRR, S1/S2 +, no rubs, no gallops Respiratory: CTA bilaterally, no wheezing, no rhonchi Abdominal: Soft, NT, ND, bowel sounds + Extremities: no edema, no cyanosis    The results of significant diagnostics from this hospitalization (including imaging, microbiology, ancillary and laboratory) are listed below for reference.     Microbiology: Recent Results (from the past 240 hour(s))  SARS Coronavirus 2 by RT PCR (hospital order, performed in [facility] hospital lab) Nasopharyngeal Nasopharyngeal Swab     Status: None   Collection Time: 01/29/20 10:07 AM   Specimen: Nasopharyngeal Swab  Result Value Ref Range Status   SARS Coronavirus 2 NEGATIVE NEGATIVE Final    Comment: (NOTE) SARS-CoV-2 target nucleic acids are NOT DETECTED.  The SARS-CoV-2 RNA is generally detectable in upper and lower respiratory specimens during the acute phase of infection. The lowest concentration of SARS-CoV-2 viral copies this assay can detect is 250 copies / mL. A  negative result does not preclude SARS-CoV-2 infection and should  not be used as the sole basis for treatment or other patient management decisions.  A negative result may occur with improper specimen collection / handling, submission of specimen other than nasopharyngeal swab, presence of viral mutation(s) within the areas targeted by this assay, and inadequate number of viral copies (<250 copies / mL). A negative result must be combined with clinical observations, patient history, and epidemiological information.  Fact Sheet for Patients:   StrictlyIdeas.no  Fact Sheet for Healthcare Providers: BankingDealers.co.za  This test is not yet approved or  cleared by the [place] FDA and has been authorized for detection and/or diagnosis of SARS-CoV-2 by FDA under an Emergency Use Authorization (EUA).  This EUA will remain in effect (meaning this test can be used) for the duration of the COVID-19 declaration under Section 564(b)(1) of the Act, 21 U.S.C. section 360bbb-3(b)(1), unless the authorization is terminated or revoked sooner.  Performed at [facility], [address], [place], [place] [address]   MRSA PCR Screening     Status: None   Collection Time: 01/29/20 12:59 PM   Specimen: Nasal Mucosa; Nasopharyngeal  Result Value Ref Range Status   MRSA by PCR NEGATIVE NEGATIVE Final    Comment:        The GeneXpert MRSA Assay (FDA approved for NASAL specimens only), is one component of a comprehensive MRSA colonization surveillance program. It is not intended to diagnose MRSA infection nor to guide or monitor treatment for MRSA infections. Performed at [facility], [place] [address] [place], [place] [address]      Labs: BNP (last 3 results) No results for input(s): BNP in the last 8760 hours. Basic Metabolic Panel: Recent Labs  Lab 01/31/20 0604 02/01/20 0735 02/02/20 1012 02/03/20 0333 02/06/20 0436  NA 137  135 132* 135 136  K 4.4 4.1 4.0 4.0 4.0  CL 103 101 100 103 104  CO2 26 23 21* 22 23  GLUCOSE 124* 126* 110* 149* 118*  BUN 18 22 27* 31* 24*  CREATININE 1.06 1.12 1.13 1.09 1.09  CALCIUM 9.0 9.1 8.8* 8.6* 8.5*   Liver Function Tests: No results for input(s): AST, ALT, ALKPHOS, BILITOT, PROT, ALBUMIN in the last 168 hours. No results for input(s): LIPASE, AMYLASE in the last 168 hours. No results for input(s): AMMONIA in the last 168 hours. CBC: Recent Labs  Lab 01/31/20 0604 01/31/20 0604 02/01/20 0735 02/02/20 1012 02/02/20 1842 02/03/20 0333 02/06/20 0436  WBC 12.1*   < > 11.1* 10.2 11.2* 13.5* 10.0  NEUTROABS 9.1*  --   --   --   --   --  7.1  HGB 12.5*   < > 13.4 12.5* 12.1* 12.0* 11.5*  HCT 37.7*   < > 40.5 36.8* 36.4* 35.4* 34.9*  MCV 99.0   < > 98.5 98.7 98.9 99.7 99.7  PLT 146*   < > 161 168 165 165 234   < > = values in this interval not displayed.   Cardiac Enzymes: No results for input(s): CKTOTAL, CKMB, CKMBINDEX, TROPONINI in the last 168 hours. BNP: Invalid input(s): POCBNP CBG: No results for input(s): GLUCAP in the last 168 hours. D-Dimer No results for input(s): DDIMER in the last 72 hours. Hgb A1c No results for input(s): HGBA1C in the last 72 hours. Lipid Profile No results for input(s): CHOL, HDL, LDLCALC, TRIG, CHOLHDL, LDLDIRECT in the last 72 hours. Thyroid function studies No results for input(s): TSH, T4TOTAL, T3FREE, THYROIDAB in the last 72 hours.  Invalid input(s):  FREET3 Anemia work up No results for input(s): VITAMINB12, FOLATE, FERRITIN, TIBC, IRON, RETICCTPCT in the last 72 hours. Urinalysis    Component Value Date/Time   COLORURINE COLORLESS (A) 01/29/2020 0712   APPEARANCEUR CLEAR 01/29/2020 0712   LABSPEC 1.003 (L) 01/29/2020 0712   PHURINE 7.0 01/29/2020 0712   GLUCOSEU NEGATIVE 01/29/2020 0712   HGBUR MODERATE (A) 01/29/2020 0712   BILIRUBINUR NEGATIVE 01/29/2020 0712   KETONESUR NEGATIVE 01/29/2020 0712   PROTEINUR  NEGATIVE 01/29/2020 0712   UROBILINOGEN 0.2 04/03/2013 1125   NITRITE NEGATIVE 01/29/2020 0712   LEUKOCYTESUR NEGATIVE 01/29/2020 1093   Sepsis Labs Invalid input(s): PROCALCITONIN,  WBC,  LACTICIDVEN Microbiology Recent Results (from the past 240 hour(s))  SARS Coronavirus 2 by RT PCR (hospital order, performed in [place] hospital lab) Nasopharyngeal Nasopharyngeal Swab     Status: None   Collection Time: 01/29/20 10:07 AM   Specimen: Nasopharyngeal Swab  Result Value Ref Range Status   SARS Coronavirus 2 NEGATIVE NEGATIVE Final    Comment: (NOTE) SARS-CoV-2 target nucleic acids are NOT DETECTED.  The SARS-CoV-2 RNA is generally detectable in upper and lower respiratory specimens during the acute phase of infection. The lowest concentration of SARS-CoV-2 viral copies this assay can detect is 250 copies / mL. A negative result does not preclude SARS-CoV-2 infection and should not be used as the sole basis for treatment or other patient management decisions.  A negative result may occur with improper specimen collection / handling, submission of specimen other than nasopharyngeal swab, presence of viral mutation(s) within the areas targeted by this assay, and inadequate number of viral copies (<250 copies / mL). A negative result must be combined with clinical observations, patient history, and epidemiological information.  Fact Sheet for Patients:   StrictlyIdeas.no  Fact Sheet for Healthcare Providers: BankingDealers.co.za  This test is not yet approved or  cleared by the [place] FDA and has been authorized for detection and/or diagnosis of SARS-CoV-2 by FDA under an Emergency Use Authorization (EUA).  This EUA will remain in effect (meaning this test can be used) for the duration of the COVID-19 declaration under Section 564(b)(1) of the Act, 21 U.S.C. section 360bbb-3(b)(1), unless the authorization is terminated  or revoked sooner.  Performed at [facility], [address] [place], [place] [address]   MRSA PCR Screening     Status: None   Collection Time: 01/29/20 12:59 PM   Specimen: Nasal Mucosa; Nasopharyngeal  Result Value Ref Range Status   MRSA by PCR NEGATIVE NEGATIVE Final    Comment:        The GeneXpert MRSA Assay (FDA approved for NASAL specimens only), is one component of a comprehensive MRSA colonization surveillance program. It is not intended to diagnose MRSA infection nor to guide or monitor treatment for MRSA infections. Performed at [facility], [place] [address], [place], [place] [address]      Time coordinating discharge: 35 minutes.   SIGNED:   Hosie Poisson, MD  [facility] 02/06/2020, 11:31 AM

## 2020-02-06 NOTE — Progress Notes (Signed)
[facility] [place] the best today that he has felt since being here.   Objective (!) 162/85 70 98.4 F (36.9 C) (Oral) 16 97%  Intake/Output Summary (Last 24 hours) at 02/06/2020 0739 Last data filed at 02/06/2020 0519 Gross per 24 hour  Intake 480 ml  Output --  Net 480 ml   Moving all 4 ext. With intact N/V/M Palpable pedal pulses B Right groin incision healing well, soft without hematoma Lungs non labored breathing Heart RRR, BP systolic 288 this am.  Below 150 since 1300 yesterday.    Assessment/Planning: POD # 4 TVAR right femoral artery exposure  Patent TEVAR with pedal pulses palpable BP control still required IV BB.  Pending adjustments in medications to maintain BP systolic below 337.   Patient really wants to go home today if possible.    Roxy Horseman 02/06/2020 7:39 AM --  Laboratory Lab Results: Recent Labs    02/06/20 0436  WBC 10.0  HGB 11.5*  HCT 34.9*  PLT 234   BMET Recent Labs    02/06/20 0436  NA 136  K 4.0  CL 104  CO2 23  GLUCOSE 118*  BUN 24*  CREATININE 1.09  CALCIUM 8.5*    COAG Lab Results  Component Value Date   INR 1.1 01/29/2020   No results found for: PTT

## 2020-02-08 ENCOUNTER — Other Ambulatory Visit: Payer: Self-pay

## 2020-02-08 DIAGNOSIS — I7102 Dissection of abdominal aorta: Secondary | ICD-10-CM

## 2020-02-13 ENCOUNTER — Other Ambulatory Visit: Payer: Self-pay | Admitting: *Deleted

## 2020-02-13 ENCOUNTER — Encounter: Payer: Self-pay | Admitting: *Deleted

## 2020-02-13 NOTE — Patient Outreach (Signed)
[facility]) Care Management  02/13/2020  Scott [name] Dec 16, 1950 [id number]   EMMI-general discharge  RED ON EMMI ALERT Day # 1 Date: Friday 02/08/20 [place] Reason: read discharge papers? No Know who to call about changes in condition? NO  Red Alert Day #4 Monday 02/11/20 1312 Lot interest in things? Yes Sad/hopeless/anxious/empty? 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  Outreach attempt #1 successful to his home/mobile number  Patient is able to verify HIPAA, DOB and address [facility] Care Management RN reviewed and addressed red alert with patient   EMMI:  [name] confirmed all his EMMI answers were correct  He states he does not have his after summary visit but is not sure if one of his daughters has it  [facility] RN CM discussed that he would follow up with his cardiologist if there were changes in his condition  "Still a little weak " He reports "went to town today and I was worn out" He reports he is staying with his daughter Scott [name] since his discharge  He reports he is taking all his medications but has not had to use oxycodone He reports only using Tylenol once.   He denies depression symptoms He reports "all that is gone"  [facility] RN CM interventions Reviewed the scanned Epic after summary visit for 02/06/20 with him and daughter Scott [name]  Discussed which doctors to call with changes in his conditions [facility] RN CM discussed how to call cardiology or vascular surgeon or [facility] 24 hr RN line  Transition of care assessment completed [facility] RN CM provided [facility] RN CM number and [facility] 24 hr nurse call number  Social: [name] is a 69 year old male who has wonderful support of his family He is presently living with his daughter Scott [name] since  his recent discharge His family assists with  IADLs (instrumental Activities of Daily Living) He is independent/assist with  Activities of daily living (ADLs)    Past Medical History:  Diagnosis Date  . Dissection of aorta, abdominal ([place]) 02/02/2020  . Hypertension   low back pain  constipation   DME: walker   Medications: He.denies concerns with taking medications as prescribed, affording medications, side effects of medications and questions about medications   Appointments: 02/14/20 [name], Cardiology HTN clinic 02/28/20 9 am Dr Ruta Hinds Vascular Surgeon  Advance Directives:   Consent: [facility] RN CM reviewed [facility] services with patient. Patient gave verbal consent for services [facility] telephonic RN CM.   Advised patient that there will be further automated EMMI- post discharge calls to assess how the patient is doing following the recent hospitalization Advised the patient that another call may be received from a nurse if any of their responses were abnormal. Patient voiced understanding and was appreciative of f/u call.   Plan: [facility] RN CM will follow up with [name] within the next 14-21 business days Pt encouraged to return a call to [place] CM prn [facility] RN CM sent a successful outreach letter as discussed with [facility] brochure enclosed for review     Scott [name] L. Lavina Hamman, RN, BSN, [place] Coordinator Office number [phone] Mobile number [phone]  Main [facility] number [phone] Fax number [phone]

## 2020-02-14 ENCOUNTER — Encounter: Payer: Self-pay | Admitting: Cardiovascular Disease

## 2020-02-14 ENCOUNTER — Other Ambulatory Visit: Payer: Self-pay

## 2020-02-14 ENCOUNTER — Ambulatory Visit (INDEPENDENT_AMBULATORY_CARE_PROVIDER_SITE_OTHER): Payer: No Typology Code available for payment source | Admitting: Cardiovascular Disease

## 2020-02-14 VITALS — BP 160/62 | HR 66 | Ht 68.5 in | Wt 175.0 lb

## 2020-02-14 DIAGNOSIS — F17201 Nicotine dependence, unspecified, in remission: Secondary | ICD-10-CM

## 2020-02-14 DIAGNOSIS — Z5181 Encounter for therapeutic drug level monitoring: Secondary | ICD-10-CM

## 2020-02-14 DIAGNOSIS — I10 Essential (primary) hypertension: Secondary | ICD-10-CM | POA: Diagnosis not present

## 2020-02-14 HISTORY — DX: Essential (primary) hypertension: I10

## 2020-02-14 HISTORY — DX: Nicotine dependence, unspecified, in remission: F17.201

## 2020-02-14 MED ORDER — VALSARTAN 80 MG PO TABS: 80.0000 mg | ORAL_TABLET | Freq: Every day | ORAL | 1 refills | Status: DC

## 2020-02-14 NOTE — Progress Notes (Signed)
Hypertension Clinic Initial Assessment:    Date:  02/14/2020   ID:  Scott [name], DOB 1951-04-05, MRN [id number]  PCP:  Elsie Lincoln, MD  Cardiologist:  No primary care provider on file.  Nephrologist:  Referring MD: Elsie Lincoln, MD   CC: Hypertension  History of Present Illness:    Scott [name] is a 69 y.o. male with a hx of hypertension, prior tobacco abuse, and type B dissection here to establish care in the hypertension clinic. Scott [name] reports that he has had hypertension for many years. It has never been very well-controlled. Until recently he got all his medical care in the [name] medical system. He presented to [facility] with bilateral leg pain weakness and back pain. 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 His aneurysm started just beyond the origin of the left subclavian artery and extended into the internal and external iliac arteries. There was a slit like opening of the left external iliac. The right renal artery arose from the true lumen and was widely patent. 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 The more superior one arose from the true lumen and the more inferior arose from both the true and false lumens. 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 He ynderwent  thoracic stent placement by Dr. Oneida Alar on 8/14. At the end of the procedure there was mild narrowing of the proximal common iliac artery to about 25 to 30%.  Since being discharged Scott [name] has been okay. He has been feeling very tired and fatigued. He recalls being on medicine before his procedure but does not remember what he was taking. He does note that labetalol is new. He complains of some lower back pain. He no longer has leg weakness. He has no chest pain and his breathing is stable. He denies any lower extremity edema,  orthopnea, or PND. He does not follow any particular diet and does not necessarily follow the sodium he takes in. He drinks a couple beers daily and denies any withdrawal symptoms. In the past he drank a lot of coffee. Since his hospitalization he is only had 2 cups. He also previously smoked and quit approximately 1 month ago. This was 2 weeks before he developed the type B dissection. At home his blood pressure has been ranging from 130/76-160 5/90. It typically is in the 140s. He does not feel any different regardless of his blood pressure. He looks forward to getting back to walking his dogs.   Past Medical History:  Diagnosis Date  . Dissection of aorta, abdominal ([place]) 02/02/2020  . Essential hypertension 02/14/2020  . Hypertension   . 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 ABDOMINAL AORTIC ENDOVASCULAR STENT GRAFT  02/02/2020    type b  . APPENDECTOMY    . THORACIC AORTIC ENDOVASCULAR STENT GRAFT N/A 02/02/2020   Procedure: REPAIR TYPE B THORACIC AORTIC ENDOVASCULAR STENT;  Surgeon: [name], MD;  Location: [facility] OR;  Service: Vascular;  Laterality: N/A;    Current Medications: Current Meds  Medication Sig  . amLODipine (NORVASC) 10 MG tablet Take 1 tablet (10 mg total) by mouth daily.  . hydrALAZINE (APRESOLINE) 100 MG tablet Take 1 tablet (100 mg total) by mouth every 8 (eight) hours.  Marland Kitchen labetalol (NORMODYNE) 100 MG  tablet Take 1 tablet (100 mg total) by mouth 3 (three) times daily.  . Nutritional Supplements (IBS SUPPORT PO) Take 1 tablet by mouth in the morning and at bedtime.  . [DISCONTINUED] GLUCOSAMINE HCL PO Take 1 tablet by mouth in the morning and at bedtime.  . [DISCONTINUED] oxyCODONE 10 MG TABS Take 0.5 tablets (5 mg total) by mouth every 6 (six) hours as needed for moderate pain.     Allergies:   Patient has no known allergies.   Social History   Socioeconomic History  . Marital status: Married     Spouse name: Not on file  . Number of children: 3  . Years of education: Not on file  . Highest education level: Not on file  Occupational History  . Not on file  Tobacco Use  . Smoking status: Former Research scientist (life sciences)  . Smokeless tobacco: Never Used  Vaping Use  . Vaping Use: Never used  Substance and Sexual Activity  . Alcohol use: No  . Drug use: No  . Sexual activity: Not on file  Other Topics Concern  . Not on file  Social History Narrative  . Not on file   Social Determinants of Health   Financial Resource Strain:   . Difficulty of Paying Living Expenses: Not on file  Food Insecurity: No Food Insecurity  . Worried About Charity fundraiser in the Last Year: Never true  . Ran Out of Food in the Last Year: Never true  Transportation Needs: No Transportation Needs  . Lack of Transportation (Medical): No  . Lack of Transportation (Non-Medical): No  Physical Activity:   . Days of Exercise per Week: Not on file  . Minutes of Exercise per Session: Not on file  Stress:   . Feeling of Stress : Not on file  Social Connections: Moderately Integrated  . Frequency of Communication with Friends and Family: More than three times a week  . Frequency of Social Gatherings with Friends and Family: More than three times a week  . Attends Religious Services: 1 to 4 times per year  . Active Member of Clubs or Organizations: No  . Attends Archivist Meetings: Patient refused  . Marital Status: Married     Family History: The patient's family history includes Lung cancer in his sister.  ROS:   Please see the history of present illness.     All other systems reviewed and are negative.  EKGs/Labs/Other Studies Reviewed:    EKG:  EKG is not ordered today.   Recent Labs: 01/30/2020: Magnesium 1.9 02/06/2020: BUN 24; Creatinine, Ser 1.09; Hemoglobin 11.5; Platelets 234; Potassium 4.0; Sodium 136   Recent Lipid Panel    Component Value Date/Time   TRIG 66 01/30/2020 0121     Physical Exam:    VS:  BP (!) 160/62   Pulse 66   Ht 5' 8.5" (1.74 m)   Wt 175 lb (79.4 kg)   SpO2 99%   BMI 26.22 kg/m  , BMI Body mass index is 26.22 kg/m. GENERAL:  Well appearing HEENT: Pupils equal round and reactive, fundi not visualized, oral mucosa unremarkable NECK:  No jugular venous distention, waveform within normal limits, carotid upstroke brisk and symmetric, no bruits, no thyromegaly LYMPHATICS:  No cervical adenopathy LUNGS:  Clear to auscultation bilaterally HEART:  RRR.  PMI not displaced or sustained,S1 and S2 within normal limits, no S3, no S4, no clicks, no rubs, II/VI sysotolic murmurs ABD:  Flat, positive bowel sounds normal in frequency  in pitch, no bruits, no rebound, no guarding, no midline pulsatile mass, no hepatomegaly, no splenomegaly EXT:  2 plus pulses throughout, no edema, no cyanosis no clubbing SKIN:  No rashes no nodules NEURO:  Cranial nerves II through XII grossly intact, motor grossly intact throughout PSYCH:  Cognitively intact, oriented to person place and time   ASSESSMENT:    1. Therapeutic drug monitoring   2. Essential hypertension   3. Tobacco abuse, in remission     PLAN:    # Essential hypertension: Scott [name]'s blood pressure remains poorly controlled despite being on multiple medications. He is taking his medications regularly. We did discuss the importance of dietary changes such as limiting his sodium intake. We recommended the DASH diet and did give him information about this. He will continue taking amlodipine, hydralazine, and labetalol. We will also add valsartan 80 mg daily. He will need a basic metabolic panel in a week. Given that there was some degree of renal artery stenosis we will need to watch this closely. He does have a repeat CT-a of the chest and abdomen pending.  There is no evidence of an adrenal adenoma. Will not treat for hyperaldosteronism or pheochromocytoma at this time.  he is not interested in enrolling  in the PREP exercise and nutrition program through the [facility].    # Type B Aortic Dissection:  Scott [name] is recovering well from his dissection. Blood pressure control will be important as above. Recommend checking lipids at follow-up.  # Tobacco abuse : He was congratulated on smoking cessation.  Disposition:    FU with MD/PharmD in 1 month    Medication Adjustments/Labs and Tests Ordered: Current medicines are reviewed at length with the patient today.  Concerns regarding medicines are outlined above.  Orders Placed This Encounter  Procedures  . Basic metabolic panel   Meds ordered this encounter  Medications  . valsartan (DIOVAN) 80 MG tablet    Sig: Take 1 tablet (80 mg total) by mouth daily.    Dispense:  90 tablet    Refill:  1     Signed, [name], MD  02/14/2020 5:54 PM    [place]

## 2020-02-14 NOTE — Patient Instructions (Addendum)
Medication Instructions:  START VALSARTAN 80 MG DAILY    Labwork: BMET IN 1 WEEK    Testing/Procedures: NONE   Follow-Up: 03/19/2020 AT 2:00 PM WITH PHARM D      Special Instructions:   MONITOR YOUR BLOOD PRESSURE TWICE A DAY, LOG IN BOOK PROVIDED. BRING BOOK AND MACHINE TO FOLLOW UP   DASH Eating Plan DASH stands for "Dietary Approaches to Stop Hypertension." The DASH eating plan is a healthy eating plan that has been shown to reduce high blood pressure (hypertension). It may also reduce your risk for type 2 diabetes, heart disease, and stroke. The DASH eating plan may also help with weight loss. What are tips for following this plan?  General guidelines  Avoid eating more than 2,300 mg (milligrams) of salt (sodium) a day. If you have hypertension, you may need to reduce your sodium intake to 1,500 mg a day.  Limit alcohol intake to no more than 1 drink a day for nonpregnant women and 2 drinks a day for men. One drink equals 12 oz of beer, 5 oz of wine, or 1 oz of hard liquor.  Work with your health care provider to maintain a healthy body weight or to lose weight. Ask what an ideal weight is for you.  Get at least 30 minutes of exercise that causes your heart to beat faster (aerobic exercise) most days of the week. Activities may include walking, swimming, or biking.  Work with your health care provider or diet and nutrition specialist (dietitian) to adjust your eating plan to your individual calorie needs. Reading food labels   Check food labels for the amount of sodium per serving. Choose foods with less than 5 percent of the Daily Value of sodium. Generally, foods with less than 300 mg of sodium per serving fit into this eating plan.  To find whole grains, look for the word "whole" as the first word in the ingredient list. Shopping  Buy products labeled as "low-sodium" or "no salt added."  Buy fresh foods. Avoid canned foods and premade or frozen  meals. Cooking  Avoid adding salt when cooking. Use salt-free seasonings or herbs instead of table salt or sea salt. Check with your health care provider or pharmacist before using salt substitutes.  Do not fry foods. Cook foods using healthy methods such as baking, boiling, grilling, and broiling instead.  Cook with heart-healthy oils, such as olive, canola, soybean, or sunflower oil. Meal planning  Eat a balanced diet that includes: ? 5 or more servings of fruits and vegetables each day. At each meal, try to fill half of your plate with fruits and vegetables. ? Up to 6-8 servings of whole grains each day. ? Less than 6 oz of lean meat, poultry, or fish each day. A 3-oz serving of meat is about the same size as a deck of cards. One egg equals 1 oz. ? 2 servings of low-fat dairy each day. ? A serving of nuts, seeds, or beans 5 times each week. ? Heart-healthy fats. Healthy fats called Omega-3 fatty acids are found in foods such as flaxseeds and coldwater fish, like sardines, salmon, and mackerel.  Limit how much you eat of the following: ? Canned or prepackaged foods. ? Food that is high in trans fat, such as fried foods. ? Food that is high in saturated fat, such as fatty meat. ? Sweets, desserts, sugary drinks, and other foods with added sugar. ? Full-fat dairy products.  Do not salt foods before eating.  Try to eat at least 2 vegetarian meals each week.  Eat more home-cooked food and less restaurant, buffet, and fast food.  When eating at a restaurant, ask that your food be prepared with less salt or no salt, if possible. What foods are recommended? The items listed may not be a complete list. Talk with your dietitian about what dietary choices are best for you. Grains Whole-grain or whole-wheat bread. Whole-grain or whole-wheat pasta. Brown rice. [place] Morrow. Bulgur. Whole-grain and low-sodium cereals. Pita bread. Low-fat, low-sodium crackers. Whole-wheat flour  tortillas. Vegetables Fresh or frozen vegetables (raw, steamed, roasted, or grilled). Low-sodium or reduced-sodium tomato and vegetable juice. Low-sodium or reduced-sodium tomato sauce and tomato paste. Low-sodium or reduced-sodium canned vegetables. Fruits All fresh, dried, or frozen fruit. Canned fruit in natural juice (without added sugar). Meat and other protein foods Skinless chicken or [place]. Ground chicken or [place]. Pork with fat trimmed off. Fish and seafood. Egg whites. Dried beans, peas, or lentils. Unsalted nuts, nut butters, and seeds. Unsalted canned beans. Lean cuts of beef with fat trimmed off. Low-sodium, lean deli meat. Dairy Low-fat (1%) or fat-free (skim) milk. Fat-free, low-fat, or reduced-fat cheeses. Nonfat, low-sodium ricotta or cottage cheese. Low-fat or nonfat yogurt. Low-fat, low-sodium cheese. Fats and oils Soft margarine without trans fats. Vegetable oil. Low-fat, reduced-fat, or light mayonnaise and salad dressings (reduced-sodium). Canola, safflower, olive, soybean, and sunflower oils. Avocado. Seasoning and other foods Herbs. Spices. Seasoning mixes without salt. Unsalted popcorn and pretzels. Fat-free sweets. What foods are not recommended? The items listed may not be a complete list. Talk with your dietitian about what dietary choices are best for you. Grains Baked goods made with fat, such as croissants, muffins, or some breads. Dry pasta or rice meal packs. Vegetables Creamed or fried vegetables. Vegetables in a cheese sauce. Regular canned vegetables (not low-sodium or reduced-sodium). Regular canned tomato sauce and paste (not low-sodium or reduced-sodium). Regular tomato and vegetable juice (not low-sodium or reduced-sodium). Angie Fava. Olives. Fruits Canned fruit in a light or heavy syrup. Fried fruit. Fruit in cream or butter sauce. Meat and other protein foods Fatty cuts of meat. Ribs. Fried meat. Berniece Salines. Sausage. Bologna and other processed lunch meats.  Salami. Fatback. Hotdogs. Bratwurst. Salted nuts and seeds. Canned beans with added salt. Canned or smoked fish. Whole eggs or egg yolks. Chicken or [place] with skin. Dairy Whole or 2% milk, cream, and half-and-half. Whole or full-fat cream cheese. Whole-fat or sweetened yogurt. Full-fat cheese. Nondairy creamers. Whipped toppings. Processed cheese and cheese spreads. Fats and oils Butter. Stick margarine. Lard. Shortening. Ghee. Bacon fat. Tropical oils, such as coconut, palm kernel, or palm oil. Seasoning and other foods Salted popcorn and pretzels. Onion salt, garlic salt, seasoned salt, table salt, and sea salt. Worcestershire sauce. Tartar sauce. Barbecue sauce. Teriyaki sauce. Soy sauce, including reduced-sodium. Steak sauce. Canned and packaged gravies. Fish sauce. Oyster sauce. Cocktail sauce. Horseradish that you find on the shelf. Ketchup. Mustard. Meat flavorings and tenderizers. Bouillon cubes. Hot sauce and Tabasco sauce. Premade or packaged marinades. Premade or packaged taco seasonings. Relishes. Regular salad dressings. Where to find more information:  National Heart, Lung, and [place]: https://wilson-eaton.com/  American Heart Association: www.heart.org Summary  The DASH eating plan is a healthy eating plan that has been shown to reduce high blood pressure (hypertension). It may also reduce your risk for type 2 diabetes, heart disease, and stroke.  With the DASH eating plan, you should limit salt (sodium) intake to 2,300 mg a day. If  you have hypertension, you may need to reduce your sodium intake to 1,500 mg a day.  When on the DASH eating plan, aim to eat more fresh fruits and vegetables, whole grains, lean proteins, low-fat dairy, and heart-healthy fats.  Work with your health care provider or diet and nutrition specialist (dietitian) to adjust your eating plan to your individual calorie needs. This information is not intended to replace advice given to you by your health  care provider. Make sure you discuss any questions you have with your health care provider. Document Released: 05/27/2011 Document Revised: 05/20/2017 Document Reviewed: 05/31/2016 Elsevier Patient Education  2020 [employer].

## 2020-02-19 ENCOUNTER — Ambulatory Visit (HOSPITAL_COMMUNITY): Payer: Medicare HMO

## 2020-02-22 ENCOUNTER — Ambulatory Visit (HOSPITAL_COMMUNITY): Payer: Medicare HMO

## 2020-02-22 ENCOUNTER — Ambulatory Visit (HOSPITAL_COMMUNITY): Payer: No Typology Code available for payment source

## 2020-02-28 ENCOUNTER — Ambulatory Visit (INDEPENDENT_AMBULATORY_CARE_PROVIDER_SITE_OTHER): Payer: Self-pay | Admitting: Vascular Surgery

## 2020-02-28 ENCOUNTER — Telehealth: Payer: Self-pay | Admitting: Cardiovascular Disease

## 2020-02-28 ENCOUNTER — Encounter: Payer: Self-pay | Admitting: Vascular Surgery

## 2020-02-28 ENCOUNTER — Other Ambulatory Visit: Payer: Self-pay

## 2020-02-28 ENCOUNTER — Other Ambulatory Visit: Payer: Self-pay | Admitting: *Deleted

## 2020-02-28 VITALS — BP 136/68 | HR 68 | Temp 98.0°F | Resp 20 | Ht 68.5 in | Wt 168.0 lb

## 2020-02-28 DIAGNOSIS — I71019 Dissection of thoracic aorta, unspecified: Secondary | ICD-10-CM

## 2020-02-28 DIAGNOSIS — I7101 Dissection of thoracic aorta: Secondary | ICD-10-CM

## 2020-02-28 NOTE — Progress Notes (Signed)
Patient is a 69 year old male who returns for follow-up today.  He had repair of a type B aortic dissection with a Gore tag graft February 02, 2020.  He has no abdominal or back pain at this point.  He has no claudication.  He does report overall deconditioning and lack of appetite.  Physical exam:  Vitals:   02/28/20 0845  BP: 136/68  Pulse: 68  Resp: 20  Temp: 98 F (36.7 C)  SpO2: 97%  Weight: 168 lb (76.2 kg)  Height: 5' 8.5" (1.74 m)   Abdomen: Soft nontender  Extremities: 2+ femoral dorsalis pedis radial pulses bilaterally  Assessment: Doing well status post repair of type B aortic dissection overall deconditioning should improve with time.  Plan: We will set up patient for an appointment with his blood pressure medication physician to discuss which medications need to keep going and possible side effects.  The patient will follow up with me in 3 to 4 weeks for a virtual visit for CT angio chest abdomen pelvis.  He will then have once yearly visits.  Patient was counseled that we need to keep his blood pressure less than 156 systolic at all times.  Ruta Hinds, MD [facility] Office: [phone]

## 2020-02-28 NOTE — Telephone Encounter (Signed)
Spoke with Stanton Kidney from vein and vascular who is requesting that the patient be seen sooner in regards to his blood pressure medications. Patient is currently scheduled for 9/29 in the HTN clinic. She is asking that we see him sooner if possible. I advised her that we do have openings for sooner appointments. She requested that I reach out to the patient to reschedule.   Left message for patient to call back to move appointment with HTN clinic sooner.

## 2020-02-28 NOTE — Telephone Encounter (Signed)
Patient called back and I advised him that there were openings for him to be seen in the HTN clinic next week. Patient states that he is not sure if that will work for him and he will have to check with his daughter because she will be bringing him. He states that he will talk with her and call back to reschedule.

## 2020-02-28 NOTE — Telephone Encounter (Signed)
Mary from V[facility] and Ve[facility] is calling requesting an appointment for the patient before 03/27/20(the date he will be seen in there office) in regards to BP medication. She states she believes a virtual would be okay since that is all she has available at this time, but wanted to clarify with Dr. Oval Linsey before scheduling. Please advise.

## 2020-02-29 ENCOUNTER — Telehealth: Payer: Self-pay

## 2020-02-29 NOTE — Telephone Encounter (Signed)
Patient would like his CTA of the Chest/Abdomen/Pelvis to be authorized through the [name].  He is supposed to be with the [facility] [phone], ext [phone]) but they said he never made recent appointments to established himself with them.  He is trying to change to [facility] and says he has faxed all pertinent paperwork to them.  I called [place] and they do not have record of him either.  [phone]).  Patient is aware that [place] Will cover his procedures if he wants [place] to use them.  He does not.  He said he will contact the [facility] and call us when everything is approved.  Thurston Hole., LPN

## 2020-03-06 ENCOUNTER — Other Ambulatory Visit: Payer: Self-pay | Admitting: *Deleted

## 2020-03-06 ENCOUNTER — Other Ambulatory Visit: Payer: Self-pay

## 2020-03-06 ENCOUNTER — Encounter: Payer: Self-pay | Admitting: *Deleted

## 2020-03-06 NOTE — Telephone Encounter (Signed)
Per [facility], Pt still has not contact them or made any efforts to make an appt.

## 2020-03-06 NOTE — Patient Outreach (Signed)
[facility]) Care Management  03/06/2020  Scott [name] 02/02/1951 [id number]   [facility] outreach follow up to [facility] referred Scott  Scott [name] was referred on 02/11/20 for EMMI general discharge for red alerts for discharge paper and who to contact if condition changes   Insurance: [place] [facility] ([facility]) x 1 ED visits x 1 in the last 6 months  Last admission to 01/29/20-02/06/20 small dissecting abdominal aortic aneurysm, repair of the type B dissection with thoracic stent placement  Outreach attempt successful to his home/mobile number  Scott is able to verify HIPAA identifiers  Follow up  Scott [name] reports he is improving  He reports being more active  He denies medical concerns at this time His BP is improving and he is taking his medications as ordered  no preference for flu shot had covid shot after having covid in 05/2019  He has some wt loss from 175 to 168 lbs BMI 25.17 He is ordered IBS support   Scott [name] reports he wants to transfer his primary care provider (PCP)from the [employer] ([name]) to the [place] He reports completing paperwork and faxing it back to the [name]. Pending a response  Plans [facility] RN CM will follow up with Scott [name] with in the next 30 business days Pt encouraged to return a call to [facility] RN CM prn  Goals Addressed              This Visit's Progress     Scott Stated   .  Scott [name] will be able to manage hypertension at home (pt-stated)   On track     [place] (see longtitudinal plan of care for additional care plan information)  Objective:  . Last practice recorded BP readings:  BP Readings from Last 3 Encounters:  02/28/20 136/68  02/14/20 (!) 160/62  02/06/20 134/76 .   Marland Kitchen Most recent eGFR/CrCl: No results found for: EGFR  No components found for: CRCL  Current Barriers:  Marland Kitchen Knowledge deficit related to self care management of  hypertension . Cognitive Deficits  Case Manager Clinical Goal(s):  Marland Kitchen Over the next 45 days, Scott will verbalize understanding of plan for hypertension management . Over the next 31 days, Scott will not experience hospital admission. Hospital Admissions in last 6 months = 1 . Over the next 90 days, Scott will verbalize basic understanding of hypertension disease process and self health management plan as evidenced by documented BP < 140/90  . 03/06/20 BP getting better see above  Interventions:  . Evaluation of current treatment plan related to hypertension self management and Scott's adherence to plan as established by provider. . Reviewed medications with Scott and discussed importance of compliance . Discussed plans with Scott for ongoing care management follow up and provided Scott with direct contact information for care management team . Reviewed scheduled/upcoming provider appointments including:  . Provided education regarding increasing physical activity  Scott Self Care Activities:  . Self administers medications as prescribed . Attends all scheduled provider appointments . Calls provider office for new concerns, questions, or BP outside discussed parameters . Monitors BP and records as discussed . Adheres to a low sodium diet/DASH diet . Increase physical activity as tolerated . Verbalize where to go to receive medical care  Please see past updates related to this goal by clicking on the "Past Updates" button in the selected goal  Scott [name] L. Lavina Hamman, RN, BSN, [place] Coordinator Office number [phone] Main [facility] number [phone] Fax number [phone]

## 2020-03-07 ENCOUNTER — Other Ambulatory Visit: Payer: Self-pay | Admitting: *Deleted

## 2020-03-07 NOTE — Patient Outreach (Signed)
[facility]) Care Management  03/07/2020  Scott [name] [birth year] [id number]   [place] coordination - Transfer of Veteran's administration (VA)  services   [facility] RN CM outreached and left a message for [name], [place] at [place] ext [phone] to inquire about assistance in helping patient with transfer of [place] services from [place] to [place]  No answer. [facility] RN CM left HIPAA HIPAA [facility] Portability and Accountability Act) compliant voicemail message along with CM's contact info.   Plans [facility] RN CM will follow up with Mr Kegley with in the next 30 business days  Breindel Collier L. Lavina Hamman, RN, BSN, [place] Coordinator Office number [phone] Mobile number [phone]  Main [facility] number [phone] Fax number [phone]

## 2020-03-12 ENCOUNTER — Other Ambulatory Visit: Payer: Self-pay | Admitting: *Deleted

## 2020-03-12 NOTE — Patient Outreach (Signed)
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   Care coordination  Outreach to Scott Scott [name], Scott Scott [name] at [phone] ext [phone] to inquire about assistance in helping patient with transfer of Scott [name] services from Scott [place] to [name] with Scott Scott [name] who reports that Scott Scott [name] has 2 options  1) obtaining a Technical sales engineer consult or 2) Going to [place] clinic for walk in process   [facility] RN CM outreach to Scott Scott [name] and provided with extension [phone] for Scott [employer] with Scott Scott [name] who confirmed Scott Scott [name] at extension [phone] at [name] clinic as Scott last provider to enter a note for Scott patient Last time seen July 23 2015 by a RN, but notes indicate Scott Scott [name] staff  tried contact in February 2018 unsuccessfully  Questionable [place] visits Scott Scott [name] recommend pt get connected with Scott Scott [name] to get assigned to Scott Scott [name] is only able to see Scott [name] not Scott Scott [name] at this time Scott Scott [name] also recommends pt update his emergency contact information for Scott Scott Scott [name]  wife Scott Scott [name]) is still listed as being Scott emergency contact  Scott Scott [name] confirms in order for him to have his daughter involved in his [place] care he would need to update this   Outreach to Scott Scott [name]  Patient is able to verify HIPAA (Scott Scott [name] and Scott [name]) identifiers Reviewed and addressed Scott  purpose of Scott follow up call with Scott patient  Consent: Scott Scott [name] (Scott Scott [name]) RN CM reviewed Scott Scott [name] services with patient. Patient gave verbal consent for services.  Updated patient  He confirms he has never been to Scott [name] only Scott Scott [name] He states his forms completed and fax x 2 were picked up from Scott Scott [name] from  Scott Scott [name]	Scott Scott [name] He reports no response at this time from Scott Scott [name] staff  [place] attempted outreaches to Scott Scott [name] and to Scott Scott [name]	Scott Scott [name] during Scott outreach with Scott patient without successful outcomes  Pt agreed to allow [name] attempt to reach a [name] staff member during regular Scott business hours  [facility] RN CM discussed him going to [name] clinic as a walk in but he confirms his vehicle is broken but he is getting it fix soon  [facility] RN CM discussed and offered to assist with Scott Scott [name] medical transportation but Scott [place] prefers to use his own transportation   Scott [name] RN CM discussed him updating his emergency contact information for Scott Scott Scott [name] services He reports his wife , Scott Scott [name] "is no longer involved" Shared with him Scott recommendations of Scott Scott [name] He voices appreciation and states he will make changes so his daughters can be his emergency contact   Plans [facility] RN CM  Will continue to attempt to assist patient with transfer to [place] services  Pt encouraged to return a call to [facility] RN CM prn  Goals Addressed              This Visit's Progress     Patient Stated   .  Patient [name] will be able to manage hypertension at [name] (pt-stated)   On track     Scott Scott [name] (see longtitudinal plan of care for additional care plan information)  Objective:  . Last practice recorded BP readings:  BP Readings from Last 3 Encounters:  02/28/20 136/68  02/14/20 (!) 160/62  02/06/20 134/76 .   Marland Kitchen Most recent eGFR/CrCl: No results found for: EGFR  No components found for: CRCL  Current Barriers:  Marland Kitchen Knowledge deficit related to self care  management of hypertension . Cognitive Deficits  Case Manager Clinical Goal(s):  Marland Kitchen Over Scott next 45 days, patient will verbalize understanding of plan for hypertension management . Over Scott next 31 days, patient will not experience Scott [name] admission. Scott [name] Admissions in last 6 months = 1 . Over Scott next 90 days, patient will verbalize basic understanding of hypertension disease process and self health management plan as evidenced by documented BP < 140/90  . Over Scott next 30 days patient will be able to have Scott Scott [name] services closer to him as verbalized during outreach-03/12/20 Pending successful outreach with [place] staff to confirm forms received  . 03/06/20 BP getting better see above  Interventions:  . Evaluation of current treatment plan related to hypertension self management and patient's adherence to plan as established by provider. . Reviewed medications with patient and discussed importance of compliance . Discussed plans with patient for ongoing care management follow up and provided patient with direct contact information for care management team . Reviewed scheduled/upcoming provider appointments including:  . Provided education regarding increasing physical activity . 03/07/20 message left for Scott Scott [name] at [phone] . 03/12/20 various outreaches to Scott Scott [name] staff to attempt confirm pt eligible to change to [name]  Patient Self Care Activities:  . Self administers medications as prescribed . Attends all scheduled provider appointments . Calls provider office for Scott concerns, questions, or BP outside discussed parameters . Monitors BP and records as discussed . Adheres to a low sodium diet/DASH diet . Increase physical activity as tolerated . Verbalize where to go to receive medical care  Please see past updates related to this goal by clicking on Scott "Past Updates" button in Scott selected goal           Aidynn Polendo L.  Lavina Hamman, RN, BSN, [place] Coordinator Office number [phone] Main [name] number [phone] Fax number [phone]

## 2020-03-14 ENCOUNTER — Other Ambulatory Visit: Payer: Self-pay | Admitting: *Deleted

## 2020-03-14 NOTE — Patient Outreach (Signed)
[name]) Care Management  [name]  Scott [name] 1951/04/16 [id number]   [facility] care coordination and outreach follow up to [facility] referred patient  Scott Scott [name] was referred on 02/11/20 for [facility] general discharge for red alerts for discharge paper and who to contact if condition changes   Insurance:Veteran's Administration-[employer]are [facility]x 1ED visits x 1in the last 6 months Last admission to 01/29/20-02/06/20 small dissecting abdominal aortic aneurysm,repair of the type B dissection with thoracic stent placement   Care coordination Outreach to [place]  [phone] spoke with staff who  transferred me to Mrs Scott [name] In Eligibility  She reports Scott Scott [name] is "out" She checked with [facility] RN CM was transferred to [place].  [facility] RN CM completed a conference call with Scott [name] and Scott Scott [name] was able to get his faxed information schedule to see provider end of April 15 2020 0900 Scott [name]  Plans [facility] RN CM will follow up with Scott [name] within the next 30-35 business days Pt encouraged to return a call to [facility] RN CM prn Faxed notes to new pcp Goals Addressed              This Visit's Progress     Patient Stated   .  Patient [name] will be able to manage hypertension at home (pt-stated)        [place] (see longtitudinal plan of care for additional care plan information)  Objective:  . Last practice recorded BP readings:  BP Readings from Last 3 Encounters:  02/28/20 136/68  02/14/20 (!) 160/62  02/06/20 134/76 .   Marland Kitchen Most recent eGFR/CrCl: No results found for: EGFR  No components found for: CRCL  Current Barriers:  Marland Kitchen Knowledge deficit related to self care management of hypertension . Cognitive Deficits  Case Manager Clinical Goal(s):  Marland Kitchen Over the next 45 days, patient will verbalize understanding of plan for hypertension management . Over the next 31 days, patient will not  experience hospital admission. Hospital Admissions in last 6 months = 1 . Over the next 90 days, patient will verbalize basic understanding of hypertension disease process and self health management plan as evidenced by documented BP < 140/90  . Over the next 30 days patient will be able to have [place] services closer to him as verbalized during outreach-03/14/20 Goal met outreach with pt to [facility] 04/15/20 9 am appt with Scott Ludwig NP . 03/06/20 BP getting better see above  Interventions:  . Evaluation of current treatment plan related to hypertension self management and patient's adherence to plan as established by provider. . Reviewed medications with patient and discussed importance of compliance . Discussed plans with patient for ongoing care management follow up and provided patient with direct contact information for care management team . Reviewed scheduled/upcoming provider appointments including:  . Provided education regarding increasing physical activity . 03/07/20 message left for [place] at [phone] . 03/12/20 various outreaches to [facility] staff to attempt confirm pt eligible to change to [place] clinic . 03/14/20 outreach to [facility]- pt transfer eligibility, pcp Scott [name])- conference to pt  Patient Self Care Activities:  . Self administers medications as prescribed . Attends all scheduled provider appointments . Calls provider office for new concerns, questions, or BP outside discussed parameters . Monitors BP and records as discussed . Adheres to a low sodium diet/DASH diet . Increase physical activity as  tolerated . Verbalize where to go to receive medical care  Please see past updates related to this goal by clicking on the "Past Updates" button in the selected goal           Scott [name] L. Lavina Hamman, RN, BSN, [place] Coordinator Office number [phone] Main [facility] number [phone] Fax  number [phone]

## 2020-03-18 ENCOUNTER — Ambulatory Visit: Payer: Self-pay | Admitting: *Deleted

## 2020-03-19 ENCOUNTER — Ambulatory Visit (INDEPENDENT_AMBULATORY_CARE_PROVIDER_SITE_OTHER): Payer: Medicare HMO | Admitting: Pharmacist Clinician (PhC)/ Clinical Pharmacy Specialist

## 2020-03-19 ENCOUNTER — Other Ambulatory Visit: Payer: Self-pay

## 2020-03-19 DIAGNOSIS — I10 Essential (primary) hypertension: Secondary | ICD-10-CM

## 2020-03-19 MED ORDER — OLMESARTAN MEDOXOMIL 20 MG PO TABS: 20.0000 mg | ORAL_TABLET | Freq: Every day | ORAL | 5 refills | Status: DC

## 2020-03-19 NOTE — Assessment & Plan Note (Signed)
Patient with essential hypertension and history of aortic aneurysm repair.  He stopped hydralazine and valsartan due to potential side effects.  Had a discussion with him about the problems he encountered with those medications.  Reviewed need to keep pressure well controlled as well.  We will leave him off the hydralazine and have him try olmesartan.  Explained that it is a hydrophilic ARB and therefore should not cross the BBB. (and thus not cause any night terrors).  He will need to repeat metabolic panel in 2 weeks and return in 4 for follow up.  He was asked to check home BP no more than twice daily and record on log sheet.  His home meter was tested in the office today and read accurately.  Patient and daughter agreeable with this plan

## 2020-03-19 NOTE — Patient Instructions (Signed)
  Go to the lab - week of October 11  Check your blood pressure at home daily and keep record of the readings.  Take your BP meds as follows:  Start olmesartan 20 mg once daily in the mornings   Continue with amlodipine and labetalol  Bring all of your meds, your BP cuff and your record of home blood pressures to your next appointment.  Exercise as you're able, try to walk approximately 30 minutes per day.  Keep salt intake to a minimum, especially watch canned and prepared boxed foods.  Eat more fresh fruits and vegetables and fewer canned items.  Avoid eating in fast food restaurants.    HOW TO TAKE YOUR BLOOD PRESSURE: . Rest 5 minutes before taking your blood pressure. .  Don't smoke or drink caffeinated beverages for at least 30 minutes before. . Take your blood pressure before (not after) you eat. . Sit comfortably with your back supported and both feet on the floor (don't cross your legs). . Elevate your arm to heart level on a table or a desk. . Use the proper sized cuff. It should fit smoothly and snugly around your bare upper arm. There should be enough room to slip a fingertip under the cuff. The bottom edge of the cuff should be 1 inch above the crease of the elbow. . Ideally, take 3 measurements at one sitting and record the average.

## 2020-03-19 NOTE — Progress Notes (Signed)
03/19/2020 Scott [name] August 02, 1950 [id number]   HPI:  Scott [name] is a 69 y.o. male patient of Dr Oval Linsey, with a [place] below who presents today for advanced hypertension clinic follow up.  He was seen by Dr. Oval Linsey on August 26 and found to have a pressure of 160/62.  Patient was previously seen by the [facility] medical [facility].   Last month he was found to have a thoracic aortic aneurysm, which was repaired at [facility].  It was stressed to him that he would want to be aggressive in keeping his blood pressure down in the future.  When he saw Dr. Oval Linsey he was taking amlodipine 10 mg daily, labetalol 100 mg tid and hydralazine 100 mg tid.  She added valsartan 80 mg, with metabolic panel to be drawn after a week.  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    Today patient is in the office with his daughter.  He states that he only took one dose of the valsartan, as it made the side effects of hydrazine "enhanced".  When asked for clarification, patient stated that the hydralazine was causing extreme fatigue, restlessness, insomnia and night terrors.  Stated that the night he took valsartan all of those symptoms were worse.  Stopped both medications at that time.     Blood Pressure Goal:  130/80  Current Medications: amlodipine 10 mg qd, labetalol 100 mg tid,   Family Hx: father was extremely sensitive to meds, died at 35 [place]; mother died at 30 from complications of previous accident; siblings without heart issues, one brother with agent Orange; sister died from lung cancer in early 18's; 3 children, no health issues  Social Hx: alcohol - never more than 2 per day, when neighbors/friends come to socialize; no tobacco - quit smoking at time of aneurysm, smoked off/on for about 20 years prior; coffee daily, only occasional soda or tea  Diet: drinks mostly water; was vegetarian for about 20-25 years, does eat some  meats, but leans more towards vegetables; [place] burgers; lots of salads  Exercise: walks regularly, fatigue comes on quickly  Home BP readings: brought 13 readings with him, however they were all taken on 2 days, just hours apart.  Intolerances: hydralazine caused fatigue  Labs: 8/21:  Na 136, K 4.0, Glu 118, BUN 24, SCr 1.09, GFR >60  Wt Readings from Last 3 Encounters:  03/19/20 174 lb 6.4 oz (79.1 kg)  02/28/20 168 lb (76.2 kg)  02/14/20 175 lb (79.4 kg)   BP Readings from Last 3 Encounters:  03/19/20 (!) 150/70  02/28/20 136/68  02/14/20 (!) 160/62   Pulse Readings from Last 3 Encounters:  03/19/20 (!) 55  02/28/20 68  02/14/20 66    Current Outpatient Medications  Medication Sig Dispense Refill  . amLODipine (NORVASC) 10 MG tablet Take 1 tablet (10 mg total) by mouth daily. 30 tablet 1  . labetalol (NORMODYNE) 100 MG tablet Take 1 tablet (100 mg total) by mouth 3 (three) times daily. 90 tablet 3  . Nutritional Supplements (IBS SUPPORT PO) Take 1 tablet by mouth in the morning and at bedtime.    Marland Kitchen olmesartan (BENICAR) 20 MG tablet Take 1 tablet (20 mg total) by mouth daily. 30 tablet 5   No current facility-administered medications for this visit.    Allergies  Allergen Reactions  . Hydralazine     Night terrors, fatigued  . Valsartan  Night terrors and fatigued    Past Medical History:  Diagnosis Date  . Dissection of aorta, abdominal ([place]) 02/02/2020  . Essential hypertension 02/14/2020  . Hypertension   . 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 rate 15, height 5\' 8"  (1.727 m), weight 174 lb 6.4 oz (79.1 kg), SpO2 97 %.  Essential hypertension Patient with essential hypertension and history of aortic aneurysm repair.  He stopped hydralazine and valsartan due to potential side effects.  Had a discussion with him about the problems he encountered with those medications.  Reviewed need to keep pressure well  controlled as well.  We will leave him off the hydralazine and have him try olmesartan.  Explained that it is a hydrophilic ARB and therefore should not cross the BBB. (and thus not cause any night terrors).  He will need to repeat metabolic panel in 2 weeks and return in 4 for follow up.  He was asked to check home BP no more than twice daily and record on log sheet.  His home meter was tested in the office today and read accurately.  Patient and daughter agreeable with this plan   Scott [name] PharmD CPP [place] [address] [place], [place] [address] [phone]

## 2020-03-26 ENCOUNTER — Other Ambulatory Visit: Payer: Self-pay | Admitting: *Deleted

## 2020-03-26 ENCOUNTER — Telehealth: Payer: Self-pay

## 2020-03-26 NOTE — Patient Outreach (Addendum)
[facility]) Care Management  03/26/2020  Scott [name] 1951/02/07 [id number]   [place] coordination- Outreach from patient, care coordination to vascular surgeon, patient  [facility] RN CM received a message from Scott [name] stating he had not been able to obtain needed CT prior to upcoming appointment scheduled for Dr Oneida Alar this week He requested a return call to discuss this and further concerns with VA services  [facility] RN CM outreach to Dr Nona Dell office to attempt to speak with the nurse to inquire of the need of CT scan prior to seeing Dr Oneida Alar [facility] RN CM left HIPAA ([place] and Accountability Act) compliant voicemail message along with CM's contact info and also the patient's number   [facility] RN CM outreached to Scott [name] Scott [name] confirms the Veteran's administration (VA)did not clear for him to have the CT scan He reports paperwork in mail received from the [place] on 03/25/20 His first [place] medical appointment is on 04/15/20 at 0900 new [place] Administration's provider [name], FNP Staff from Dr Oneida Alar office called while speaking with Scott [name], from Dr Oneida Alar office confirms Dr Oneida Alar will not see Scott [name] until he receives the CT abdomen. [facility] RN CM  reviewed with her the issues related to getting established with [facility] Caryl Pina confirms the CT abdomen was authorized via [place] but pt voices his preference that he wants CT via [place] fax all his records to [name] previously and will refax pt records with CT request in [place] to new medical provider, Scott [name] Dr Oneida Alar fax number for possible VA authorization of the CT abdomen will be [phone]   Outreach to Scott [name] to update him of outreach from [place] Patient is able to verify HIPAA ([place] and Accountability Act) identifiers Consent: [facility] RN CM reviewed [facility] services with patient. Patient gave verbal consent for  services.  Sent EMMI on HTN and abdominal aorta aneurysm  Plan [facility] RN CM will continue to collaborate with Dr Oneida Alar staff and [place] staff  [facility] RN CM will follow up with Scott [name] prn and within the next 30 days MD involvement barriers letter sent  Goals Addressed              This Visit's Progress     Patient Stated   .  [facility]) Find Help in My Community (pt-stated)   On track     Follow Up Date *04/16/20   - follow-up on any referrals for help I am given - have a back-up plan - make a list of family or friends that I can call    Why is this important?   Knowing how and where to find help for yourself or family in your neighborhood and community is an important skill.  You will want to take some steps to learn how.    Notes:     .  [facility]) Learn More About My Health (pt-stated)   On track     Follow Up Date 04/16/20    - ask questions - speak up when I don't understand    Why is this important?   The best way to learn about your health and care is by talking to the doctor and nurse.  They will answer your questions and give you information in the way that you like best.    Notes:     Marland Kitchen  (  [facility]) Track and Manage My Blood Pressure (pt-stated)   On track     Follow Up Date 04/16/20   - check blood pressure daily - choose a place to take my blood pressure (home, clinic or office, retail store) - write blood pressure results in a log or diary    Why is this important?   You won't feel high blood pressure, but it can still hurt your blood vessels.  High blood pressure can cause heart or kidney problems. It can also cause a stroke.  Making lifestyle changes like losing a little weight or eating less salt will help.  Checking your blood pressure at home and at different times of the day can help to control blood pressure.  If the doctor prescribes medicine remember to take it the way the doctor ordered.  Call the office if you cannot afford the medicine or if there  are questions about it.     Notes:     .  COMPLETED: Patient [name] will be able to manage hypertension at home (pt-stated)        [place] (see longtitudinal plan of care for additional care plan information)  Objective:  . Last practice recorded BP readings:  BP Readings from Last 3 Encounters:  03/19/20 (!) 150/70  02/28/20 136/68  02/14/20 (!) 160/62 .   Marland Kitchen Most recent eGFR/CrCl: No results found for: EGFR  No components found for: CRCL  Current Barriers:  Marland Kitchen Knowledge deficit related to self care management of hypertension . Cognitive Deficits  Case Manager Clinical Goal(s):  Marland Kitchen Over the next 45 days, patient will verbalize understanding of plan for hypertension management . Over the next 31 days, patient will not experience hospital admission. Hospital Admissions in last 6 months = 1 . Over the next 90 days, patient will verbalize basic understanding of hypertension disease process and self health management plan as evidenced by documented BP < 140/90  . Over the next 30 days patient will be able to have [place] services closer to him as verbalized during outreach-03/14/20 Goal met outreach with pt to [facility] 04/15/20 9 am appt with [name] NP  . 03/26/20 Pt has not experienced re admission 31 day GOAL met Still with some elevations in BP continues to progress with 45 and 90 day goals* changed to patient care plans (elsevier)   Interventions:  . Evaluation of current treatment plan related to hypertension self management and patient's adherence to plan as established by provider. . Reviewed medications with patient and discussed importance of compliance . Discussed plans with patient for ongoing care management follow up and provided patient with direct contact information for care management team . Reviewed scheduled/upcoming provider appointments including:  . Provided education regarding increasing physical activity . 03/07/20 message left for [place] at [phone] . 03/12/20 various outreaches to [facility] staff to attempt confirm pt eligible to change to [place] clinic . 03/14/20 outreach to [facility]- pt transfer eligibility, pcp [name])- conference to pt  Patient Self Care Activities:  . Self administers medications as prescribed . Attends all scheduled provider appointments . Calls provider office for new concerns, questions, or BP outside discussed parameters . Monitors BP and records as discussed . Adheres to a low sodium diet/DASH diet . Increase physical activity as tolerated . Verbalize where to go to receive medical care  Please see past updates related to this goal by clicking on the "Past  Updates" button in the selected goal          Kaulin Chaves L. Lavina Hamman, RN, BSN, [place] Coordinator Office number [phone] Mobile number [phone]  Main [facility] number [phone] Fax number [phone]

## 2020-03-26 NOTE — Telephone Encounter (Signed)
I faxed clinical notes to the [facility] [phone]) for patient's first appointment with Nonie Hoyer, FNP.  We are awaiting faxed authorization from the [name] so we can go ahead and schedule patient's CTA for a follow up appointment with Dr. Oneida Alar.  Thurston Hole., LPN

## 2020-03-27 ENCOUNTER — Ambulatory Visit: Payer: Non-veteran care | Admitting: Vascular Surgery

## 2020-03-27 ENCOUNTER — Other Ambulatory Visit: Payer: Self-pay | Admitting: *Deleted

## 2020-03-27 NOTE — Patient Outreach (Signed)
[facility]) Care Management  03/27/2020  Abbas Beyene 03/27/1951 [id number]  [place] coordination- [place] Ser VA provider/CT scan  [facility] RN CM outreached to [employer] administration [facility]) [phone] prompt 2, 2  Spoke with [place] with attempt to speak with [name], FNP about need of authorization for Mr Swett's CT abdomen prior to Dr Eden Lathe next visit Discussed case information with Ronalee Belts She reports not being able to find in pt VA chart that the CT abdomen request has been received to go to Benjamine Sprague offered the fax number that would allow a fax document to reach Harless Litten NP as [phone]  [facility] also offered [facility] RN CM number and the number for Dr Oneida Alar office and fax to [place] to document in pt chart for today.   [facility] RN CM called to Dr Oneida Alar office [phone] to attempt to speak with Linus Salmons to speak with Cristie Hem to leave a message for Caryl Pina to re fax if possible the CT order, request and documents to [phone] ATTN Nonie Hoyer  Plan [facility] RN CM will continue to collaborate with Dr Oneida Alar staff and [place] staff  [facility] RN CM will follow up with Mr Reckner prn and within the next 30 days   Aarion Metzgar L. Lavina Hamman, RN, BSN, [place] Coordinator Office number [phone] Main [facility] number [phone] Fax number [phone]

## 2020-03-31 ENCOUNTER — Telehealth: Payer: Self-pay

## 2020-03-31 NOTE — Telephone Encounter (Signed)
Dr. Kipp Brood office called to confirm they have received all of our notes that were faxed for Mr. Storie on 10/07.  Thurston Hole., LPN

## 2020-04-01 ENCOUNTER — Other Ambulatory Visit: Payer: Self-pay | Admitting: Pharmacist Clinician (PhC)/ Clinical Pharmacy Specialist

## 2020-04-01 MED ORDER — AMLODIPINE BESYLATE 10 MG PO TABS: 10.0000 mg | ORAL_TABLET | Freq: Every day | ORAL | 3 refills | Status: DC

## 2020-04-02 ENCOUNTER — Other Ambulatory Visit: Payer: Self-pay

## 2020-04-02 ENCOUNTER — Telehealth: Payer: Self-pay

## 2020-04-02 DIAGNOSIS — I7101 Dissection of thoracic aorta: Secondary | ICD-10-CM

## 2020-04-02 DIAGNOSIS — I71019 Dissection of thoracic aorta, unspecified: Secondary | ICD-10-CM

## 2020-04-02 DIAGNOSIS — I7102 Dissection of abdominal aorta: Secondary | ICD-10-CM

## 2020-04-02 NOTE — Telephone Encounter (Signed)
Called and lmomed the pt to please wait at least 2 weeks before completing bmet but the orders were placed. Instructed the pt to call us if they have anymore questions

## 2020-04-07 ENCOUNTER — Other Ambulatory Visit: Payer: Self-pay | Admitting: *Deleted

## 2020-04-07 NOTE — Patient Outreach (Signed)
[facility]) Care Management  04/07/2020  Scott [name] April 22, 1951 [id number]   [place] coordination- Outreach to [employer] administration (VA)  Outreach message from [name] received  He has not heard from [place] about his CT scan  [facility] RN CM called [place] [facility] [phone] to attempt to speak with a nurse without success Transferred x 2 to a phone that rang without being answered   [facility] RN CM outreached to [place] made with Darryl who reports notes not in [place] except a 03/27/20 note in which fax confirmed received from [place] at Dr Oneida Alar office and placed in box  [facility] RN CM called to update [name] on pending outreach from [place] RN CM will continue to collaborate with Dr Oneida Alar staff and [place] staff  [facility] RN CM will follow up with [name] prn and within the next 30 days   Scott Velazquez L. Lavina Hamman, RN, BSN, [place] Coordinator Office number [phone] Mobile number [phone]  Main [facility] number [phone] Fax number [phone]

## 2020-04-09 ENCOUNTER — Other Ambulatory Visit: Payer: Self-pay | Admitting: *Deleted

## 2020-04-09 NOTE — Patient Outreach (Signed)
[facility]) Care Management  04/09/2020  Aubert Choyce 1950/07/12 [id number]   [place] coordination- return call to [place] RN CM returned a call call ([phone]) to [place] Veteran's administration ([place]) nurse who left a message to outreach to her on 04/08/20 Spoke with Butch Penny who sent a note to Tillie Rung (reported to be with a patient) Reviewed again the concern with Mr Mcmanaway wanting to get his CT scan via [name] prior to being seen by Dr Oneida Alar for follow up of his aortic aneurysm Re sent 02/06/20 discharge summary via Epic fax to Harless Litten to [phone]  Plan [facility] RN CM will continue to collaborate with Dr Oneida Alar staff and [place] staff  [facility] RN CM will follow up with Mr Scarpelli prn and within the next 30 days  Berniece Abid L. Lavina Hamman, RN, BSN, [place] Coordinator Office number [phone] Mobile number [phone]  Main [facility] number [phone] Fax number [phone]

## 2020-04-10 ENCOUNTER — Other Ambulatory Visit: Payer: Self-pay | Admitting: *Deleted

## 2020-04-10 NOTE — Patient Outreach (Signed)
[facility]) Care Management  04/10/2020  Dominico Rod [birth year] [id number]   [place] coordination- VA RN, CT  [facility] RN CM received a call from Jolaine Click administration [facility]) Nurse associated with Lenor Derrick VA FNP [facility] RN CM answered questions and reviewed the care coordination needs for Mr Lashomb since his 02/06/20 hospital discharge. Discussed the need for a CT the patient prefers to have his VA benefit assist with prior to him having a follow up appointment with Dr Ruta Hinds vascular surgeon  Tillie Rung voiced understanding, that she will discuss this with [name] and outreach to Mr Mahany  [facility] RN CM outreached unsuccessfully to Mr Vanderwoude but left him a message updating him on the outreach from [place] and the response  Plan [facility] RN CM will continue to collaborate with Dr Oneida Alar staff and [place] staff  [facility] RN CM will follow up with Mr Basnett prn and within the next 30 days Routed to Dr Darrow Bussing L. Lavina Hamman, RN, BSN, [place] Coordinator Office number [phone] Mobile number [phone]  Main [facility] number [phone] Fax number [phone]

## 2020-04-16 ENCOUNTER — Encounter: Payer: Self-pay | Admitting: *Deleted

## 2020-04-16 ENCOUNTER — Other Ambulatory Visit: Payer: Self-pay | Admitting: *Deleted

## 2020-04-16 ENCOUNTER — Other Ambulatory Visit: Payer: Self-pay

## 2020-04-16 NOTE — Patient Outreach (Signed)
[facility] Care Management  04/16/2020  Scott [name] 1951/03/24 [id number]    [place] coordination andoutreach follow up to [facility] referred patient  Scott [name] was referred on 02/11/20 for [facility] general discharge for red alerts for discharge paper and who to contact if condition changes EMMI resolved    Insurance:Veteran's Administration-[facility]x 1ED visits x 1in the last 6 months Last admission to 01/29/20-02/06/20 small dissecting abdominal aortic aneurysm,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 Patient gave verbal consent for services.   Scott [name] confirms he was seen by his new Scott [name] administration ([name]) provider Scott [name] on 04/15/20, initial assessment and labs completed, CT scan possibly for 05/16/20 or earlier  Pt will updated [name] RN CM as needed   Scott [name] reports discussing with his daughter, Scott [name] the plan of action for further treatment [facility] RN CM encouraged him to have the CT completed, return for follow up with Dr Oneida Alar and continue to see VA provider   Type B Aortic dissection repair with Scott [name] tag graft on 8/134/21 He denies chest, abdominal or back pain today He reports his blood pressure (BP) is remaining within normal limits - less than 034 systolic at all times He denies any other complaints He and [facility] RN CM discussed follow up with Dr Oneida Alar and activity level to include no heavy lifting nor heavy exercise until otherwise advised by Dr Oneida Alar   Scott [name] was sent EMMI via his listed e-mail address (Gmail account) listed with [place] for aortic dissection Letter sent to encouraged him to  review the materials on aortic dissection, abdominal aortic aneurysm, hypertension.   Plans [facility] RN CM will follow up with Scott [name] within in then next 96 business days Pt encouraged to return a call to [facility] RN CM prn Routed note to MDs   Goals Addressed              This Visit's Progress     Patient Stated   .  COMPLETED: [facility]) Find Help in My Community (pt-stated)   On track     Follow Up Date *04/16/20   - follow-up on any referrals for help I am given - have a back-up plan - make a list of family or friends that I can call    Why is this important?   Knowing how and where to find help for yourself or family in your neighborhood and community is an important skill.  You will want to take some steps to learn how.    Notes:  followed up and seen by [facility] MD 04/15/20 scheduled for CT- Resource obtained Goal met /complete    .  [facility]) Learn More About My Health (pt-stated)   On track     Follow Up Date 04/16/20    - ask questions - speak up when I don't understand    Why is this important?   The best way to learn about your health and care is by talking to the doctor and nurse.  They will answer your questions and give you information in the way that you like best.    Notes:     .  [facility]) Track and Manage My Blood  Pressure (pt-stated)   On track     Follow Up Date 04/16/20   - check blood pressure daily - choose a place to take my blood pressure (home, clinic or office, retail store) - write blood pressure results in a log or diary    Why is this important?   You won't feel high blood pressure, but it can still hurt your blood vessels.  High blood pressure can cause heart or kidney problems. It can also cause a stroke.  Making lifestyle changes like losing a little weight or eating less salt will help.  Checking your blood pressure at home and at different times of the day can help to control blood pressure.  If the doctor prescribes medicine remember to take it the way  the doctor ordered.  Call the office if you cannot afford the medicine or if there are questions about it.     Notes:        [place]renley Sayed L. Lavina Hamman, RN, BSN, [place] Coordinator Office number [phone] Main [facility] number [phone] Fax number [phone]

## 2020-04-17 ENCOUNTER — Ambulatory Visit: Payer: Medicare HMO

## 2020-04-17 NOTE — Progress Notes (Deleted)
04/17/2020 Scott [name] 1951/04/19 [id number]   HPI:  Scott [name] is a 69 y.o. male patient of Dr Oval Linsey, with a [place] below who presents today for advanced hypertension clinic follow up.  He was seen by Dr. Oval Linsey on August 26 and found to have a pressure of 160/62.  Patient was previously seen by the [facility] medical system.   Last month he was found to have a thoracic aortic aneurysm, which was repaired at [place].  It was stressed to him that he would want to be aggressive in keeping his blood pressure down in the future.  When he saw Dr. Oval Linsey he was taking amlodipine 10 mg daily, labetalol 100 mg tid and hydralazine 100 mg tid.  She added valsartan 80 mg, with metabolic panel to be drawn after a week.  Patient has history of left renal artery stenosis, however he has 2 left renal arteries, only one of which has stenosis.   At his first follow up he noted that he only took one dose of valsartan, as it made the side effects of hydrazine "enhanced".  When asked for clarification, patient stated that the hydralazine was causing extreme fatigue, restlessness, insomnia and night terrors.  Stated that the night he took valsartan all of those symptoms were worse.  Stopped both medications at that time.  At that visit we started him on olmesartan, explaining that it was a hydrophilic agent and would not cross the blood brain barrier.    Blood Pressure Goal:  130/80  Current Medications: amlodipine 10 mg qd, labetalol 100 mg tid, olmesartan 20 mg  Family Hx: father was extremely sensitive to meds, died at 66 [place]; mother died at 74 from complications of previous accident; siblings without heart issues, one brother with agent Orange; sister died from lung cancer in early 90's; 3 children, no health issues  Social Hx: alcohol - never more than 2 per day, when neighbors/friends come to socialize; no tobacco - quit smoking at time of aneurysm, smoked off/on for about 20 years prior; coffee daily,  only occasional soda or tea  Diet: drinks mostly water; was vegetarian for about 20-25 years, does eat some meats, but leans more towards vegetables; [place] burgers; lots of salads  Exercise: walks regularly, fatigue comes on quickly  Home BP readings: brought 13 readings with him, however they were all taken on 2 days, just hours apart.  Intolerances: hydralazine caused fatigue  Labs: 8/21:  Na 136, K 4.0, Glu 118, BUN 24, SCr 1.09, GFR >60  Wt Readings from Last 3 Encounters:  03/19/20 174 lb 6.4 oz (79.1 kg)  02/28/20 168 lb (76.2 kg)  02/14/20 175 lb (79.4 kg)   BP Readings from Last 3 Encounters:  03/19/20 (!) 150/70  02/28/20 136/68  02/14/20 (!) 160/62   Pulse Readings from Last 3 Encounters:  03/19/20 (!) 55  02/28/20 68  02/14/20 66    Current Outpatient Medications  Medication Sig Dispense Refill   amLODipine (NORVASC) 10 MG tablet Take 1 tablet (10 mg total) by mouth daily. 90 tablet 3   labetalol (NORMODYNE) 100 MG tablet Take 1 tablet (100 mg total) by mouth 3 (three) times daily. 90 tablet 3   Nutritional Supplements (IBS SUPPORT PO) Take 1 tablet by mouth in the morning and at bedtime.     olmesartan (BENICAR) 20 MG tablet Take 1 tablet (20 mg total) by mouth daily. 30 tablet 5   No current facility-administered medications for this visit.  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  No problem-specific Assessment & Plan notes found for this encounter.   Tommy Medal PharmD CPP [facility] [address] [place], [place] [address] [phone]

## 2020-05-19 ENCOUNTER — Other Ambulatory Visit: Payer: Self-pay

## 2020-05-19 ENCOUNTER — Other Ambulatory Visit: Payer: Self-pay | Admitting: *Deleted

## 2020-05-19 NOTE — Patient Outreach (Signed)
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 Patient gave verbal consent for services.   Scott [name] confirms he has completed the CT scan via Veteran's administration ([name]) but reports difficulty with being able to reach listed Veteran's administration (VA) primary care provider (PCP), [name], NP  to get updated information for a recent MRI result, has not been able to follow up with Dr Oneida Alar, nor been released to return to work by Harless Litten or Dr Oneida Alar. Scott [name] reports he was informed by [name] that   Scott [name] inquired if VA chart information or imaging was seen via [place]. He was informed Care everywhere status is not available at this time [place] sent text and e-mail mychart account requests to Scott [name] via Epic  Scott [name] reports he has been monitoring his BP and it has been within normal limits but he is awaiting adjustments in the doses of the Hypertension (HTN) medicines   Pt inquires about care everywhere and return to work process [facility] RN CM answered questions    [facility] RN CM interventions Outreach to [place] attempt to get CT results  faxed to Dr Ruta Hinds [phone] (fax)  Inquired about release to return to work Spoke with [place], spoke with male transfer to [place] long wait, ringing no answer Outreach to [place] [phone] [place] took a message attempt to transfer to triage RN but call disconnected "The call is being disconnected" heard in the background Outreach to [place] who was able to complete a note started by [name] to be sent to pcp or RN in the [employer] clinic for pt 1) to have CT fax to Dr Oneida Alar 2) to call pt about recent MRI results 3) to discuss return to work with pt Outreach to Dr Oneida Alar to leave a message on the nurse voice mail box to discuss patient completed CT via [name], wanting to see if CT results can be obtained, he have a follow up appointment and to discuss pt wanting to be released to return to work. Spoke 03/24/20 with [place] no see MRI results   Return call from [facility], vascular surgeon RN. Reviewed patient concerns 1) completed CT not sent to Dr Oneida Alar 2) MRI results 3) release to go back to work Discussed this RN CM unable to access care everywhere as Cristie Hem reports Dr Oneida Alar will want to see the CT imaging prior to scheduling pt to return to him   Plans [facility] RN CM will follow up with Scott [name] in 14-21 business days and collaborate with [facility] and/or Vascular surgeon staff prn  Pt encouraged to return a call to [facility] RN CM prn Routed note to MDs  Goals  Patient Stated   .  [facility]) Learn More About My Health (pt-stated)      Follow Up Date 06/02/20  - ask questions - speak up when I don't understand     Notes: 05/19/20 Pt inquires about care everywhere and return to work process    .  [facility]) Track and Manage My Blood Pressure (pt-stated)      Follow Up Date 06/02/20   - check blood pressure daily - choose a place to take my blood pressure (home, clinic or office, retail store) - write blood pressure results in a log or diary      Notes: 05/19/20  reports monitored home BP < 140/90- awaiting adjustment in medications       Travell Desaulniers L. Lavina Hamman, RN, BSN, [place] Coordinator Office number [phone] Main [facility] number [phone] Fax number [phone]

## 2020-05-20 ENCOUNTER — Other Ambulatory Visit: Payer: Self-pay

## 2020-06-02 ENCOUNTER — Telehealth: Payer: Self-pay

## 2020-06-02 ENCOUNTER — Other Ambulatory Visit: Payer: Self-pay | Admitting: *Deleted

## 2020-06-02 ENCOUNTER — Other Ambulatory Visit: Payer: Self-pay

## 2020-06-02 NOTE — Telephone Encounter (Signed)
Kim with [facility] called back in regards to patient's CT scan. She states patient has gotten the CT with the [name] but we are unable to read the results. The patient is now requesting to have the CT completed and ran through [place]. Information given to [facility], LPN to call and discuss with patient.

## 2020-06-02 NOTE — Patient Outreach (Addendum)
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 Patient gave verbal consent for services.  Follow up  Scott Scott [name] confirms he has not had any return calls from [place] administration ([name]) staff since the 05/19/20 care coordination outreaches He has e mailed, text and left message for [facility] staff without responses He was wanting to inquire about the results of the completed MRI He was updated of [facility] RN CM 05/19/20 interventions/outreaches to Scott [name] staff and Scott [name] of vascular surgeon office, Dr Oneida Alar  Scott [name] was encouraged to visit [facility] in person He reports he will attempt to do this   On June 02, 2020 Scott [name] voices interest in using his [place] Medicare coverage to get the CT for Dr Oneida Alar and to possibly get released to return to work.  [facility] RN CM interventions Outreach to Dr Oneida Alar vascular specialist office [phone] to discuss Scott Oneida Alar CT and to see if the CT needs to be completed using his [employer] coverage A message was left for the nurse to return a call to [place] including [facility] RN. CM's office number  Scott [name] returned a call to [place]. The patient's request  to get CT completed via Dr fields office using his [facility] coverage and release to return back to work discussed. Scott [name] will update Jeani Hawking the CT scheduler so she can outreach to Scott Godley to discuss the CT, questionnaire and answer question to include billing questions Confirmed Scott [name] outreach number    [facility] RN CM followed up with Scott Vi to update him  He welcomes a call from [place] from Dr Oneida Alar office    Plans [facility] RN CM will continue to collaborate with Dr Oneida Alar office staff related to Scott Scott [name]'s CT results and follow up appointment Pt encouraged to return a call to [facility] RN CM prn Routed note to MD Goals      Patient Stated   .  [facility]) Learn More About My Health (pt-stated)      Follow Up Date 06/20/20  - ask questions - speak up when I don't understand       Notes: 12/13/1 continues to need results of CT and MRI plus a follow up with Dr Oneida Alar 05/19/20 Pt inquires about care everywhere and return to work process    .  [facility]) Track and Manage My Blood Pressure (pt-stated)      Follow Up Date 06/20/20   - check blood pressure daily - choose a place to take my blood pressure (home, clinic or office, retail store) - write blood pressure results in a log or diary       Notes: 06/02/20 BP WNL  05/19/20 reports monitored home BP < 140/90- awaiting adjustment in medications        Scott [name] L. Lavina Hamman, RN, BSN,  [place] Coordinator Office number [phone] Main [facility] number [phone] Fax number [phone]

## 2020-06-04 ENCOUNTER — Other Ambulatory Visit: Payer: Self-pay

## 2020-06-04 ENCOUNTER — Telehealth: Payer: Self-pay

## 2020-06-04 DIAGNOSIS — I7102 Dissection of abdominal aorta: Secondary | ICD-10-CM

## 2020-06-04 DIAGNOSIS — I71019 Dissection of thoracic aorta, unspecified: Secondary | ICD-10-CM

## 2020-06-04 NOTE — Telephone Encounter (Signed)
Patient called our office to request an CTA Chest/Abd/Pelvis preauthorized through [facility] instead of the [facility]  Josem Kaufmann has been approved.  I tried calling patient to let him know Radiology Dept will be contacting him to schedule at time for the CTA scans.  Pt is apparently also concerned about the cost of the CTAs.  I called and left a voice message stating patient will need to call his insurance provider, [place], to get an answer to that question.    Thurston Hole., LPN

## 2020-06-05 ENCOUNTER — Ambulatory Visit: Payer: Medicare HMO | Admitting: *Deleted

## 2020-06-11 ENCOUNTER — Other Ambulatory Visit: Payer: Self-pay | Admitting: *Deleted

## 2020-06-11 ENCOUNTER — Other Ambulatory Visit: Payer: Self-pay

## 2020-06-11 NOTE — Patient Outreach (Signed)
[facility]) Care Management  06/11/2020  Bradrick Kamau [birth year] [id number]  [facility] outreach to complex care patient Mr Benjamen Koelling was referred on 02/11/20 for [facility] general discharge for red alerts for discharge paper and who to contact if condition changesEMMI resolved   Insurance:[employer]'s Administration-[employer]are [facility] [facility])x 1ED visits x 1in the last 6 months Last admission to 01/29/20-02/06/20 small dissecting abdominal aortic aneurysm,repair of the type B dissection with thoracic stent placement   Follow up Patient is able to verify HIPAA ([place] and [place]) identifiers Reviewed and addressed the purpose of the follow up call with the patient  Consent: [facility] [place]) RN CM reviewed [place] with patient. Patient gave verbal consent for services.  Follow up  Mr Christopherson confirms an outreach from Dr Nona Dell office with CTA scheduled for  July 02, 2020 He denies any worsening symptoms related to BP, back, chest pain, weight He is keeping active  Daughters remain supportive  Plans [facility] RN CM will continue to collaborate with Dr Oneida Alar office staff related to Mr Ballweg's CT prn Patient agrees to care plan and follow up within the next 30-45 business days Pt encouraged to return a call to [facility] RN CM prn Routed note to MD Goals      Patient Stated   .  [facility]) Learn More About My Health (pt-stated)      Follow Up Date 07/09/20  - ask questions - speak up when I don't understand      Notes: 12/13/1 continues to need results of CT and MRI plus a follow up with Dr Oneida Alar 05/19/20 Pt inquires about care everywhere and return to work process    .  [facility]) Track and Manage My Blood Pressure (pt-stated)      Follow Up Date 07/09/20    - check blood pressure daily - choose a place to take my blood pressure (home, clinic or office, retail store) - write blood pressure results  in a log or diary   Notes: 06/02/20 BP WNL  05/19/20 reports monitored home BP < 140/90- awaiting adjustment in medications     Quincey Nored L. Lavina Hamman, RN, BSN, [place] Coordinator Office number [phone] Main [facility] number [phone] Fax number [phone]

## 2020-07-02 ENCOUNTER — Ambulatory Visit
Admission: RE | Admit: 2020-07-02 | Discharge: 2020-07-02 | Disposition: A | Discharge status: Home / Self Care | Payer: Medicare HMO | Source: Ambulatory Visit | Attending: Vascular Surgery | Admitting: Vascular Surgery

## 2020-07-02 ENCOUNTER — Other Ambulatory Visit: Payer: Self-pay

## 2020-07-02 DIAGNOSIS — I70203 Unspecified atherosclerosis of native arteries of extremities, bilateral legs: Secondary | ICD-10-CM | POA: Diagnosis not present

## 2020-07-02 DIAGNOSIS — I7101 Dissection of thoracic aorta: Secondary | ICD-10-CM

## 2020-07-02 DIAGNOSIS — I2510 Atherosclerotic heart disease of native coronary artery without angina pectoris: Secondary | ICD-10-CM | POA: Diagnosis not present

## 2020-07-02 DIAGNOSIS — I708 Atherosclerosis of other arteries: Secondary | ICD-10-CM | POA: Diagnosis not present

## 2020-07-02 DIAGNOSIS — I71019 Dissection of thoracic aorta, unspecified: Secondary | ICD-10-CM

## 2020-07-02 DIAGNOSIS — I7102 Dissection of abdominal aorta: Secondary | ICD-10-CM

## 2020-07-02 DIAGNOSIS — I701 Atherosclerosis of renal artery: Secondary | ICD-10-CM | POA: Diagnosis not present

## 2020-07-02 MED ORDER — IOPAMIDOL (ISOVUE-370) INJECTION 76%
75.0000 mL | Freq: Once | INTRAVENOUS | Status: AC | PRN
  Administered 2020-07-02: 75 mL via INTRAVENOUS

## 2020-07-04 ENCOUNTER — Other Ambulatory Visit: Payer: Self-pay | Admitting: Cardiovascular Disease

## 2020-07-09 ENCOUNTER — Encounter: Payer: Self-pay | Admitting: *Deleted

## 2020-07-09 ENCOUNTER — Other Ambulatory Visit: Payer: Self-pay

## 2020-07-09 ENCOUNTER — Other Ambulatory Visit: Payer: Self-pay | Admitting: *Deleted

## 2020-07-09 NOTE — Patient Outreach (Signed)
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 Patient gave verbal consent for services.   Follow up  Reports he is doing very well  He reports he is intentionally attempting to lose more weight and be more active  He has completed her CT scans and is awaiting follow up from Dr Oneida Alar He is unable to provide a weight value to Scott [name] at this time but he reports his pant size has changed  He reports his BP has not been > 14/90 with home monitoring and at the pcp office He was allowed to ventilate about the Veteran's administration ([place]) and other topics Questions answered about the CT scan Questions answered about covid long hauler syndrome/anemia and he was referred to his PCP for evaluation He reports he will follow up He denies any further care coordination or disease management needs  [name] RN [name] discussed completion of goals and further [facility] services  Plans Case closure  Patient agrees to no further [facility] follow up at this time Scott [name] case closure letters sent   Goals Addressed              This Visit's Progress     Patient Stated   .  COMPLETED: [facility]) Learn More About My Health  (pt-stated)   On track     Follow Up Date 07/09/20  - ask questions - speak up when I don't understand      Notes: 07/09/20 goal met 12/13/1 continues to need results of CT and MRI plus a follow up with Dr Oneida Alar 05/19/20 Pt inquires about care everywhere and return to work process    .  COMPLETED: [facility]) Track and Manage My Blood Pressure (pt-stated)   On track     Follow Up Date 07/09/20    - check blood pressure daily - choose a place to take my blood pressure (home, clinic or office, retail store) - write blood pressure results in a log or diary      Notes: 07/09/20 goal met 06/02/20 BP WNL  05/19/20 reports monitored home BP < 140/90- awaiting adjustment in medications       Scott L. Lavina Hamman, RN, BSN, [place] Coordinator Office number [phone] Main [facility] number [phone] Fax number [phone]

## 2020-07-31 ENCOUNTER — Ambulatory Visit (INDEPENDENT_AMBULATORY_CARE_PROVIDER_SITE_OTHER): Payer: Medicare HMO | Admitting: Vascular Surgery

## 2020-07-31 ENCOUNTER — Other Ambulatory Visit: Payer: Self-pay

## 2020-07-31 DIAGNOSIS — I71019 Dissection of thoracic aorta, unspecified: Secondary | ICD-10-CM

## 2020-07-31 DIAGNOSIS — I7101 Dissection of thoracic aorta: Secondary | ICD-10-CM

## 2020-07-31 NOTE — Progress Notes (Signed)
      Virtual Visit via Telephone Note I connected with Scott [name] on 07/31/2020 using the Doxy[employer]me by telephone and verified that I was speaking with the correct person using two identifiers. Patient was located at home and accompanied by no one. I am located at our office on [facility].   The limitations of evaluation and management by telemedicine and the availability of in person appointments have been previously discussed with the patient and are documented in the patients chart. The patient expressed understanding and consented to proceed.  PCP: Kirke Corin, FNP  History of Present Illness:  Patient is a 70 year old male who is status post repair of aortic dissection with a Gore tag graft.  He currently has no chest or abdominal pain.  He overall is doing well.  His blood pressure is being followed by the [name] in [place].  He states that his systolic runs in the mid 453M.  His diastolic is in the high 46O.  Past Medical History:  Diagnosis Date  . Dissection of aorta, abdominal ([place]) 02/02/2020  . Essential hypertension 02/14/2020  . Hypertension   . 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 ABDOMINAL AORTIC ENDOVASCULAR STENT GRAFT  02/02/2020    type b  . APPENDECTOMY    . THORACIC AORTIC ENDOVASCULAR STENT GRAFT N/A 02/02/2020   Procedure: REPAIR TYPE B THORACIC AORTIC ENDOVASCULAR STENT;  Surgeon: [name], MD;  Location: [facility] OR;  Service: Vascular;  Laterality: N/A;    No outpatient medications have been marked as taking for the 07/31/20 encounter (Office Visit) with [name], MD.    Review of systems: He has no shortness of breath.  He has no chest pain.   Data: I reviewed the patient's CT angiogram from January 2022.  This shows the area of dissection has completely healed with no residual area of dissection no aneurysm mid thoracic aortic diameter was 38 mm.    I spent 10 minutes with the  patient via telephone encounter.  The patient will follow up in our APP clinic and have a CT angiogram of the chest abdomen and pelvis in 1 year.   Signed, Ruta Hinds [facility] Office: [phone]  07/31/2020, 11:48 AM

## 2020-08-08 DIAGNOSIS — Z85828 Personal history of other malignant neoplasm of skin: Secondary | ICD-10-CM | POA: Diagnosis not present

## 2020-08-08 DIAGNOSIS — Z7722 Contact with and (suspected) exposure to environmental tobacco smoke (acute) (chronic): Secondary | ICD-10-CM | POA: Diagnosis not present

## 2020-08-08 DIAGNOSIS — M1990 Unspecified osteoarthritis, unspecified site: Secondary | ICD-10-CM | POA: Diagnosis not present

## 2020-08-08 DIAGNOSIS — I10 Essential (primary) hypertension: Secondary | ICD-10-CM | POA: Diagnosis not present

## 2020-08-08 DIAGNOSIS — Z87891 Personal history of nicotine dependence: Secondary | ICD-10-CM | POA: Diagnosis not present

## 2020-09-22 ENCOUNTER — Other Ambulatory Visit: Payer: Self-pay | Admitting: Cardiovascular Disease

## 2021-08-11 ENCOUNTER — Emergency Department (HOSPITAL_COMMUNITY): Payer: No Typology Code available for payment source

## 2021-08-11 ENCOUNTER — Encounter (HOSPITAL_COMMUNITY): Payer: Self-pay | Admitting: Emergency Medicine

## 2021-08-11 ENCOUNTER — Other Ambulatory Visit: Payer: Self-pay

## 2021-08-11 ENCOUNTER — Emergency Department (HOSPITAL_COMMUNITY)
Admission: EM | Admit: 2021-08-11 | Discharge: 2021-08-11 | Disposition: A | Discharge status: Home / Self Care | Payer: No Typology Code available for payment source | Attending: Emergency Medicine | Admitting: Emergency Medicine

## 2021-08-11 DIAGNOSIS — Z7901 Long term (current) use of anticoagulants: Secondary | ICD-10-CM | POA: Diagnosis not present

## 2021-08-11 DIAGNOSIS — I10 Essential (primary) hypertension: Secondary | ICD-10-CM | POA: Insufficient documentation

## 2021-08-11 DIAGNOSIS — R6889 Other general symptoms and signs: Secondary | ICD-10-CM

## 2021-08-11 DIAGNOSIS — Z28310 Unvaccinated for covid-19: Secondary | ICD-10-CM | POA: Diagnosis not present

## 2021-08-11 DIAGNOSIS — Z20822 Contact with and (suspected) exposure to covid-19: Secondary | ICD-10-CM | POA: Diagnosis not present

## 2021-08-11 DIAGNOSIS — E871 Hypo-osmolality and hyponatremia: Secondary | ICD-10-CM | POA: Insufficient documentation

## 2021-08-11 DIAGNOSIS — R0602 Shortness of breath: Secondary | ICD-10-CM | POA: Diagnosis present

## 2021-08-11 DIAGNOSIS — Z8616 Personal history of COVID-19: Secondary | ICD-10-CM | POA: Insufficient documentation

## 2021-08-11 DIAGNOSIS — I4891 Unspecified atrial fibrillation: Secondary | ICD-10-CM | POA: Diagnosis not present

## 2021-08-11 DIAGNOSIS — E876 Hypokalemia: Secondary | ICD-10-CM | POA: Insufficient documentation

## 2021-08-11 DIAGNOSIS — R509 Fever, unspecified: Secondary | ICD-10-CM | POA: Insufficient documentation

## 2021-08-11 DIAGNOSIS — Z79899 Other long term (current) drug therapy: Secondary | ICD-10-CM | POA: Insufficient documentation

## 2021-08-11 LAB — COMPREHENSIVE METABOLIC PANEL
ALT: 24 U/L (ref 0–44)
AST: 33 U/L (ref 15–41)
Albumin: 3.5 g/dL (ref 3.5–5.0)
Alkaline Phosphatase: 66 U/L (ref 38–126)
Anion gap: 9 (ref 5–15)
BUN: 29 mg/dL — ABNORMAL HIGH (ref 8–23)
CO2: 22 mmol/L (ref 22–32)
Calcium: 8.6 mg/dL — ABNORMAL LOW (ref 8.9–10.3)
Chloride: 100 mmol/L (ref 98–111)
Creatinine, Ser: 1.19 mg/dL (ref 0.61–1.24)
GFR, Estimated: >60 mL/min (ref >60)
Glucose, Bld: 126 mg/dL — ABNORMAL HIGH (ref 70–99)
Potassium: 3.3 mmol/L — ABNORMAL LOW (ref 3.5–5.1)
Sodium: 131 mmol/L — ABNORMAL LOW (ref 135–145)
Total Bilirubin: 0.9 mg/dL (ref 0.3–1.2)
Total Protein: 6.7 g/dL (ref 6.5–8.1)

## 2021-08-11 LAB — MAGNESIUM: Magnesium: 2.1 mg/dL (ref 1.7–2.4)

## 2021-08-11 LAB — CBC WITH DIFFERENTIAL/PLATELET
Abs Immature Granulocytes: 0.03 10*3/uL (ref 0.00–0.07)
Basophils Absolute: 0 10*3/uL (ref 0.0–0.1)
Basophils Relative: 0 %
Eosinophils Absolute: 0 10*3/uL (ref 0.0–0.5)
Eosinophils Relative: 0 %
HCT: 42.6 % (ref 39.0–52.0)
Hemoglobin: 14.8 g/dL (ref 13.0–17.0)
Immature Granulocytes: 0 %
Lymphocytes Relative: 5 %
Lymphs Abs: 0.5 10*3/uL — ABNORMAL LOW (ref 0.7–4.0)
MCH: 33.8 pg (ref 26.0–34.0)
MCHC: 34.7 g/dL (ref 30.0–36.0)
MCV: 97.3 fL (ref 80.0–100.0)
Monocytes Absolute: 0.7 10*3/uL (ref 0.1–1.0)
Monocytes Relative: 8 %
Neutro Abs: 7.7 10*3/uL (ref 1.7–7.7)
Neutrophils Relative %: 87 %
Platelets: 170 10*3/uL (ref 150–400)
RBC: 4.38 MIL/uL (ref 4.22–5.81)
RDW: 12.3 % (ref 11.5–15.5)
WBC: 8.9 10*3/uL (ref 4.0–10.5)
nRBC: 0 % (ref 0.0–0.2)

## 2021-08-11 LAB — RESP PANEL BY RT-PCR (FLU A&B, COVID) ARPGX2
Influenza A by PCR: NEGATIVE
Influenza B by PCR: NEGATIVE
SARS Coronavirus 2 by RT PCR: NEGATIVE

## 2021-08-11 MED ORDER — APIXABAN 5 MG PO TABS
5.0000 mg | ORAL_TABLET | Freq: Two times a day (BID) | ORAL | Status: DC
  Administered 2021-08-11: 5 mg via ORAL
  Filled 2021-08-11: qty 1

## 2021-08-11 MED ORDER — METOPROLOL TARTRATE 25 MG PO TABS
25.0000 mg | ORAL_TABLET | Freq: Once | ORAL | Status: AC
  Administered 2021-08-11: 25 mg via ORAL
  Filled 2021-08-11: qty 1

## 2021-08-11 MED ORDER — ONDANSETRON HCL 4 MG PO TABS: 4.0000 mg | ORAL_TABLET | Freq: Three times a day (TID) | ORAL | 0 refills | Status: DC | PRN

## 2021-08-11 MED ORDER — APIXABAN 5 MG PO TABS: 5.0000 mg | ORAL_TABLET | Freq: Two times a day (BID) | ORAL | 0 refills | Status: DC

## 2021-08-11 MED ORDER — SODIUM CHLORIDE 0.9 % IV BOLUS
1000.0000 mL | Freq: Once | INTRAVENOUS | Status: AC
  Administered 2021-08-11: 1000 mL via INTRAVENOUS

## 2021-08-11 MED ORDER — POTASSIUM CHLORIDE 10 MEQ/100ML IV SOLN
10.0000 meq | Freq: Once | INTRAVENOUS | Status: AC
  Administered 2021-08-11: 10 meq via INTRAVENOUS
  Filled 2021-08-11: qty 100

## 2021-08-11 NOTE — ED Triage Notes (Signed)
Pt to the ED with complaints of Flu-like symptoms and a fever.  Pt states he gets short of breath with exertion, but feels like he is getting better.  Pt ambulated to triage with a strong steady gait.

## 2021-08-11 NOTE — ED Provider Notes (Signed)
[facility] EMERGENCY DEPARTMENT Provider Note   CSN: [id number] Arrival date & time: 08/11/21  1503     History  Chief Complaint  Patient presents with   flu like symptoms   Atrial Fibrillation    Scott [name] is a 71 y.o. male with PMHx HTN and hx of aortic dissection s/p repair who presents to the ED today with complaint of flu like symptoms.  Patient reports he has been experiencing body aches, fatigue, shortness of breath, fever with Tmax 101.1, and diarrhea for the past 5 days.  He has been taking over-the-counter medications with mild relief.  He states he has had COVID several times with most recent being in December 2021.  He states that this feels similar.  He denies any recent sick contacts.   The history is provided by the patient and medical records.      Home Medications Prior to Admission medications   Medication Sig Start Date End Date Taking? Authorizing Provider  apixaban (ELIQUIS) 5 MG TABS tablet Take 1 tablet (5 mg total) by mouth 2 (two) times daily. 08/11/21 09/10/21 Yes Shanina Kepple, PA-C  amLODipine (NORVASC) 10 MG tablet Take 1 tablet (10 mg total) by mouth daily. 04/01/20   [name], MD  labetalol (NORMODYNE) 100 MG tablet Take 1 tablet (100 mg total) by mouth 3 (three) times daily. CALL OFFICE FOR APPOINTMENT, NO MORE REFILLS UNTIL SEEN 09/26/20   [name], MD  Nutritional Supplements (IBS SUPPORT PO) Take 1 tablet by mouth in the morning and at bedtime.    [provider]  olmesartan (BENICAR) 20 MG tablet Take 1 tablet (20 mg total) by mouth daily. 03/19/20   [name], MD      Allergies    Hydralazine and Valsartan    Review of Systems   Review of Systems  Constitutional:  Positive for fatigue and fever. Negative for chills.  HENT:  Negative for ear pain and sore throat.   Respiratory:  Positive for shortness of breath. Negative for cough.   Gastrointestinal:  Positive for diarrhea. Negative for abdominal pain,  nausea and vomiting.  Genitourinary:  Negative for dysuria, flank pain and frequency.  Musculoskeletal:  Positive for myalgias.  All other systems reviewed and are negative.  Physical Exam Updated Vital Signs BP 140/84 (BP Location: Left Arm)    Pulse 84    Temp 99.9 F (37.7 C) (Oral)    Resp (!) 22    Ht 5\' 8"  (1.727 m)    Wt 79.1 kg    SpO2 97%    BMI 26.51 kg/m  Physical Exam Vitals and nursing note reviewed.  Constitutional:      Appearance: He is not ill-appearing or diaphoretic.  HENT:     Head: Normocephalic and atraumatic.  Eyes:     Conjunctiva/sclera: Conjunctivae normal.  Cardiovascular:     Rate and Rhythm: Normal rate and regular rhythm.     Pulses: Normal pulses.  Pulmonary:     Effort: Pulmonary effort is normal.     Breath sounds: Normal breath sounds. No wheezing, rhonchi or rales.     Comments: Speaking in full sentences without difficulty.  Satting 97% on room air.  Lungs clear to auscultation bilaterally. Abdominal:     Palpations: Abdomen is soft.     Tenderness: There is no abdominal tenderness. There is no guarding or rebound.  Musculoskeletal:     Cervical back: Neck supple.  Skin:    General: Skin is warm and dry.  Neurological:     Mental Status: He is alert.    ED Results / Procedures / Treatments   Labs (all labs ordered are listed, but only abnormal results are displayed) Labs Reviewed  COMPREHENSIVE METABOLIC PANEL - Abnormal; Notable for the following components:      Result Value   Sodium 131 (*)    Potassium 3.3 (*)    Glucose, Bld 126 (*)    BUN 29 (*)    Calcium 8.6 (*)    All other components within normal limits  CBC WITH DIFFERENTIAL/PLATELET - Abnormal; Notable for the following components:   Lymphs Abs 0.5 (*)    All other components within normal limits  RESP PANEL BY RT-PCR (FLU A&B, COVID) [id number]  MAGNESIUM    EKG EKG Interpretation  Date/Time:  Tuesday August 11 2021 16:33:19 EST Ventricular Rate:  118 PR  Interval:    QRS Duration: 104 QT Interval:  292 QTC Calculation: 409 R Axis:   101 Text Interpretation: Atrial fibrillation Ventricular premature complex Anterior infarct, old Repol abnrm suggests ischemia, diffuse leads Baseline wander in lead(s) V3 Confirmed by Sherwood Gambler [phone]) on 08/11/2021 5:45:07 PM  Radiology DG Chest Port 1 View  Result Date: 08/11/2021 CLINICAL DATA:  Short of breath, fever EXAM: PORTABLE CHEST 1 VIEW COMPARISON:  Prior chest x-ray 02/02/2020 FINDINGS: Stable postsurgical changes of prior TEVAR. Cardiomegaly and mediastinal contours remain unchanged. Right perihilar atelectasis versus scarring appears similar compared to prior images. No focal airspace infiltrate, pulmonary edema, pleural effusion or pneumothorax. IMPRESSION: No active disease. Stable cardiomegaly and right perihilar atelectasis versus scarring. Surgical changes of prior TEVAR. Electronically Signed   By: Jacqulynn Cadet M.D.   On: 08/11/2021 16:42    Procedures Procedures    Medications Ordered in ED Medications  potassium chloride 10 mEq in 100 mL IVPB (10 mEq Intravenous New Bag/Given 08/11/21 1853)  apixaban (ELIQUIS) tablet 5 mg (has no administration in time range)  sodium chloride 0.9 % bolus 1,000 mL (1,000 mLs Intravenous New Bag/Given 08/11/21 1836)  metoprolol tartrate (LOPRESSOR) tablet 25 mg (25 mg Oral Given 08/11/21 1832)    ED Course/ Medical Decision Making/ A&P       CHA2DS2-VASc Score: 3                    Medical Decision Making 71 year old male who presents to the ED today for flulike symptoms and diarrhea x5 days.  Unvaccinated for [place].  States feels similar to previous COVID infection with most recent being in December 2021.  Arrival to the ED today patient is temperature is 99.9.  He is nontachycardic, nontachypneic, satting 97% on room air and able to speak in full sentences without difficulty.  His blood pressure is noted to be elevated at 187/162 however I  suspect that this is falsely elevated.  We will plan for repeat.  We will plan for COVID and flu testing.  Given age we will plan for chest x-ray as well to rule out pneumonia.  Patient is on day 5 and states that he is interested in antivirals if he test positive for COVID.  We will plan for lab work at this time to assess for GFR.   Workup overall reassuring besides EKG with findings of new onset A fib with RVR. Will provide fluids, replete K given low at 3.3, and provide metoprolol dose. Pt is on 100 Labetalol TID however did not take his afternoon or evening dose yet. CHADS vasc score of 3.  Will require anticoagulation.   HR improved to 84 however still in A fib. Will start on Eliquis. Dose provided in the ED and pt discharged home with same. Outpatient A fib clinic referral provided. Pt reports improvement in symptoms after fluids.   Amount and/or Complexity of Data Reviewed Labs: ordered.    Details: CBC without leukocytosis. Hgb stable at 14.8 CMP with glucose 126. Sodium 131. K low at 3.3. Creatinine stable at 1.19 and BUN 29.  COVID and flu neg Radiology: ordered.    Details: CXR clear ECG/medicine tests: ordered.    Details: EKG with findings of A fib with RVR. No hx of same.  Risk Prescription drug management.          Final Clinical Impression(s) / ED Diagnoses Final diagnoses:  Flu-like symptoms  Atrial fibrillation, unspecified type ([place])    Rx / DC Orders ED Discharge Orders          Ordered    Amb referral to AFIB Clinic        08/11/21 1815    apixaban (ELIQUIS) 5 MG TABS tablet  2 times daily        08/11/21 1947             Discharge Instructions      Please pick up prescription and start taking medication as prescribed  Continue taking your regular medications as prescribed  We have placed an ambulatory referral to the A fib clinic. They should contact you this week to schedule an appointment.   Drink plenty of fluids to stay hydrated and  rest as much as possible. Take Tylenol as needed for fevers.   Return to the ED for any new/worsening symptoms         Eustaquio Maize, Hershal Coria 08/11/21 1949    Sherwood Gambler, MD 08/12/21 2255

## 2021-08-11 NOTE — Discharge Instructions (Addendum)
Please pick up prescription and start taking medication as prescribed  Continue taking your regular medications as prescribed  We have placed an ambulatory referral to the A fib clinic. They should contact you this week to schedule an appointment.   Drink plenty of fluids to stay hydrated and rest as much as possible. Take Tylenol as needed for fevers.   Return to the ED for any new/worsening symptoms

## 2021-08-12 ENCOUNTER — Emergency Department (HOSPITAL_COMMUNITY)
Admission: EM | Admit: 2021-08-12 | Discharge: 2021-08-12 | Disposition: A | Discharge status: Home / Self Care | Payer: No Typology Code available for payment source | Attending: Emergency Medicine | Admitting: Emergency Medicine

## 2021-08-12 ENCOUNTER — Encounter (HOSPITAL_COMMUNITY): Payer: Self-pay | Admitting: Emergency Medicine

## 2021-08-12 ENCOUNTER — Emergency Department (HOSPITAL_COMMUNITY): Payer: No Typology Code available for payment source

## 2021-08-12 ENCOUNTER — Inpatient Hospital Stay (HOSPITAL_COMMUNITY)
Admission: EM | Admit: 2021-08-12 | Discharge: 2021-08-13 | DRG: 193 | Disposition: A | Discharge status: Home / Self Care | Payer: No Typology Code available for payment source | Attending: Family Medicine | Admitting: Family Medicine

## 2021-08-12 DIAGNOSIS — Z801 Family history of malignant neoplasm of trachea, bronchus and lung: Secondary | ICD-10-CM | POA: Diagnosis not present

## 2021-08-12 DIAGNOSIS — I10 Essential (primary) hypertension: Secondary | ICD-10-CM | POA: Diagnosis present

## 2021-08-12 DIAGNOSIS — Z7901 Long term (current) use of anticoagulants: Secondary | ICD-10-CM

## 2021-08-12 DIAGNOSIS — R197 Diarrhea, unspecified: Secondary | ICD-10-CM | POA: Diagnosis present

## 2021-08-12 DIAGNOSIS — Z5321 Procedure and treatment not carried out due to patient leaving prior to being seen by health care provider: Secondary | ICD-10-CM | POA: Insufficient documentation

## 2021-08-12 DIAGNOSIS — I4891 Unspecified atrial fibrillation: Secondary | ICD-10-CM | POA: Diagnosis present

## 2021-08-12 DIAGNOSIS — R0602 Shortness of breath: Secondary | ICD-10-CM | POA: Diagnosis not present

## 2021-08-12 DIAGNOSIS — M47814 Spondylosis without myelopathy or radiculopathy, thoracic region: Secondary | ICD-10-CM | POA: Diagnosis present

## 2021-08-12 DIAGNOSIS — E876 Hypokalemia: Secondary | ICD-10-CM | POA: Diagnosis present

## 2021-08-12 DIAGNOSIS — I7102 Dissection of abdominal aorta: Secondary | ICD-10-CM | POA: Diagnosis present

## 2021-08-12 DIAGNOSIS — Z87891 Personal history of nicotine dependence: Secondary | ICD-10-CM

## 2021-08-12 DIAGNOSIS — Z8616 Personal history of COVID-19: Secondary | ICD-10-CM | POA: Diagnosis not present

## 2021-08-12 DIAGNOSIS — I71019 Dissection of thoracic aorta, unspecified: Secondary | ICD-10-CM | POA: Diagnosis present

## 2021-08-12 DIAGNOSIS — Z79899 Other long term (current) drug therapy: Secondary | ICD-10-CM | POA: Diagnosis not present

## 2021-08-12 DIAGNOSIS — J189 Pneumonia, unspecified organism: Secondary | ICD-10-CM | POA: Diagnosis present

## 2021-08-12 DIAGNOSIS — J9601 Acute respiratory failure with hypoxia: Secondary | ICD-10-CM | POA: Diagnosis present

## 2021-08-12 DIAGNOSIS — Z20822 Contact with and (suspected) exposure to covid-19: Secondary | ICD-10-CM | POA: Diagnosis present

## 2021-08-12 DIAGNOSIS — Z888 Allergy status to other drugs, medicaments and biological substances status: Secondary | ICD-10-CM

## 2021-08-12 DIAGNOSIS — I248 Other forms of acute ischemic heart disease: Secondary | ICD-10-CM | POA: Diagnosis present

## 2021-08-12 LAB — LACTIC ACID, PLASMA: Lactic Acid, Venous: 1.5 mmol/L (ref 0.5–1.9)

## 2021-08-12 LAB — APTT: aPTT: 29 seconds (ref 24–36)

## 2021-08-12 LAB — CBC WITH DIFFERENTIAL/PLATELET
Abs Immature Granulocytes: 0.03 10*3/uL (ref 0.00–0.07)
Basophils Absolute: 0 10*3/uL (ref 0.0–0.1)
Basophils Relative: 0 %
Eosinophils Absolute: 0 10*3/uL (ref 0.0–0.5)
Eosinophils Relative: 0 %
HCT: 43.5 % (ref 39.0–52.0)
Hemoglobin: 15.7 g/dL (ref 13.0–17.0)
Immature Granulocytes: 0 %
Lymphocytes Relative: 11 %
Lymphs Abs: 0.9 10*3/uL (ref 0.7–4.0)
MCH: 35 pg — ABNORMAL HIGH (ref 26.0–34.0)
MCHC: 36.1 g/dL — ABNORMAL HIGH (ref 30.0–36.0)
MCV: 96.9 fL (ref 80.0–100.0)
Monocytes Absolute: 0.6 10*3/uL (ref 0.1–1.0)
Monocytes Relative: 7 %
Neutro Abs: 6.6 10*3/uL (ref 1.7–7.7)
Neutrophils Relative %: 82 %
Platelets: 179 10*3/uL (ref 150–400)
RBC: 4.49 MIL/uL (ref 4.22–5.81)
RDW: 12.3 % (ref 11.5–15.5)
WBC: 8.1 10*3/uL (ref 4.0–10.5)
nRBC: 0 % (ref 0.0–0.2)

## 2021-08-12 LAB — COMPREHENSIVE METABOLIC PANEL
ALT: 37 U/L (ref 0–44)
AST: 50 U/L — ABNORMAL HIGH (ref 15–41)
Albumin: 3.4 g/dL — ABNORMAL LOW (ref 3.5–5.0)
Alkaline Phosphatase: 68 U/L (ref 38–126)
Anion gap: 12 (ref 5–15)
BUN: 25 mg/dL — ABNORMAL HIGH (ref 8–23)
CO2: 21 mmol/L — ABNORMAL LOW (ref 22–32)
Calcium: 8.7 mg/dL — ABNORMAL LOW (ref 8.9–10.3)
Chloride: 100 mmol/L (ref 98–111)
Creatinine, Ser: 1.29 mg/dL — ABNORMAL HIGH (ref 0.61–1.24)
GFR, Estimated: 59 mL/min — ABNORMAL LOW (ref >60)
Glucose, Bld: 140 mg/dL — ABNORMAL HIGH (ref 70–99)
Potassium: 3.1 mmol/L — ABNORMAL LOW (ref 3.5–5.1)
Sodium: 133 mmol/L — ABNORMAL LOW (ref 135–145)
Total Bilirubin: 0.9 mg/dL (ref 0.3–1.2)
Total Protein: 6.6 g/dL (ref 6.5–8.1)

## 2021-08-12 LAB — TROPONIN I (HIGH SENSITIVITY)
Troponin I (High Sensitivity): 44 ng/L — ABNORMAL HIGH (ref <18)
Troponin I (High Sensitivity): 39 ng/L — ABNORMAL HIGH (ref <18)

## 2021-08-12 LAB — PROTIME-INR
INR: 1.1 (ref 0.8–1.2)
Prothrombin Time: 14.2 seconds (ref 11.4–15.2)

## 2021-08-12 MED ORDER — POTASSIUM CHLORIDE CRYS ER 20 MEQ PO TBCR
40.0000 meq | EXTENDED_RELEASE_TABLET | Freq: Once | ORAL | Status: AC
  Administered 2021-08-12: 40 meq via ORAL
  Filled 2021-08-12: qty 2

## 2021-08-12 MED ORDER — CARVEDILOL 3.125 MG PO TABS: 12.5000 mg | ORAL_TABLET | Freq: Two times a day (BID) | ORAL | Status: DC

## 2021-08-12 MED ORDER — SODIUM CHLORIDE 0.9 % IV SOLN
500.0000 mg | INTRAVENOUS | Status: DC
  Administered 2021-08-12: 500 mg via INTRAVENOUS
  Filled 2021-08-12: qty 5

## 2021-08-12 MED ORDER — CEFTRIAXONE SODIUM 1 G IJ SOLR
1.0000 g | Freq: Once | INTRAMUSCULAR | Status: AC
  Administered 2021-08-12: 1 g via INTRAVENOUS
  Filled 2021-08-12: qty 10

## 2021-08-12 MED ORDER — LABETALOL HCL 200 MG PO TABS: 100.0000 mg | ORAL_TABLET | Freq: Two times a day (BID) | ORAL | Status: DC

## 2021-08-12 MED ORDER — LABETALOL HCL 200 MG PO TABS: 100.0000 mg | ORAL_TABLET | Freq: Three times a day (TID) | ORAL | Status: DC

## 2021-08-12 MED ORDER — SODIUM CHLORIDE 0.9 % IV BOLUS
1000.0000 mL | Freq: Once | INTRAVENOUS | Status: AC
  Administered 2021-08-12: 1000 mL via INTRAVENOUS

## 2021-08-12 MED ORDER — AMLODIPINE BESYLATE 5 MG PO TABS
10.0000 mg | ORAL_TABLET | Freq: Every day | ORAL | Status: DC
Start: 2021-08-13 — End: 2021-08-13
  Administered 2021-08-13: 10 mg via ORAL
  Filled 2021-08-12: qty 2

## 2021-08-12 MED ORDER — APIXABAN 5 MG PO TABS
5.0000 mg | ORAL_TABLET | Freq: Two times a day (BID) | ORAL | Status: DC
Start: 2021-08-12 — End: 2021-08-13
  Administered 2021-08-12 – 2021-08-13 (×2): 5 mg via ORAL
  Filled 2021-08-12 (×2): qty 1

## 2021-08-12 MED ORDER — ONDANSETRON HCL 4 MG/2ML IJ SOLN
4.0000 mg | Freq: Once | INTRAMUSCULAR | Status: AC
  Administered 2021-08-12: 4 mg via INTRAVENOUS
  Filled 2021-08-12: qty 2

## 2021-08-12 NOTE — Progress Notes (Signed)
Called to patient bedside as he had questions regarding why he had to stay overnight. Discussed the plan with the patient to wean and evaluate his O2 needs and transition to oral antibiotics if he remains stable, he had no further questions.    Aliveah Gallant, DO

## 2021-08-12 NOTE — ED Provider Triage Note (Addendum)
Emergency Medicine Provider Triage Evaluation Note  Scott [name] , a 71 y.o. male  was evaluated in triage.  Pt complains of shortness of breath.  Shortness of breath has been present over the last week.  Shortness of breath is worse when he exerts himself.  Patient denies any associated chest pain, lightheadedness, syncope, palpitations.  Patient reports that he was seen at [facility] emergency department yesterday and diagnosed with new onset of A-fib.  Patient was started on Eliquis.  Patient took 1 dose of Eliquis yesterday.  Review of Systems  Positive: Shortness of breath Negative: Chest pain, lightheadedness, syncope, palpitations, leg swelling or tenderness, numbness, weakness, abdominal pain, nausea, vomiting, diaphoresis  Physical Exam  BP (!) 153/93 (BP Location: Right Arm)    Pulse (!) 105    Temp 98.3 F (36.8 C) (Oral)    Resp 20    SpO2 97%  Gen:   Awake, no distress   Resp:  Normal effort, lungs clear to auscultation bilaterally MSK:   Moves extremities without difficulty  Other:  +2 radial pulse bilaterally.  Medical Decision Making  Medically screening exam initiated at 2:12 PM.  Appropriate orders placed.  Scott [name] was informed that the remainder of the evaluation will be completed by another provider, this initial triage assessment does not replace that evaluation, and the importance of remaining in the ED until their evaluation is complete.  EKG shows patient is in A-fib with RVR.  Heart rate from 110s to 130s.  Patient made level 2 will be moved back to next available room.  Patient has history of aortic dissection status postrepair.  Low suspicion for dissection at this time as patient has no chest pain, neurological complaints and has +2 radial pulses bilaterally.   Loni Beckwith, PA-C 08/12/21 1413    Loni Beckwith, PA-C 08/12/21 1415

## 2021-08-12 NOTE — ED Provider Notes (Signed)
[facility] EMERGENCY DEPARTMENT Provider Note   CSN: [id number] Arrival date & time: 08/12/21  1349     History  Chief Complaint  Patient presents with   Shortness of Breath    Scott [name] is a 71 y.o. male.  Pt is a 71 yo wm with a hx of htn, a.fib (diagnosed yesterday), and tobacco abuse.  Pt has been sob with fever for several days.  He did go to [facility] yesterday and was diagnosed with afib.  CXR yesterday read as negative.  Covid neg.  Pt has felt worse and more sob.  He's had a cough, but no more fever. He has had no appetite.      Home Medications Prior to Admission medications   Medication Sig Start Date End Date Taking? Authorizing Provider  acetaminophen (TYLENOL) 325 MG tablet Take 975 mg by mouth every 6 (six) hours as needed for mild pain.   Yes [provider]  amLODipine (NORVASC) 10 MG tablet Take 1 tablet (10 mg total) by mouth daily. 04/01/20  Yes [name], MD  apixaban (ELIQUIS) 5 MG TABS tablet Take 1 tablet (5 mg total) by mouth 2 (two) times daily. 08/11/21 09/10/21 Yes Venter, Margaux, PA-C  labetalol (NORMODYNE) 100 MG tablet Take 1 tablet (100 mg total) by mouth 3 (three) times daily. CALL OFFICE FOR APPOINTMENT, NO MORE REFILLS UNTIL SEEN Patient taking differently: Take 100 mg by mouth 2 (two) times daily. 09/26/20  Yes [name], MD  Multiple Vitamin (MULTIVITAMIN) tablet Take 1 tablet by mouth every other day.   Yes [provider]  sodium chloride (OCEAN) 0.65 % SOLN nasal spray Place 1 spray into both nostrils 3 (three) times daily as needed for congestion.   Yes [provider]  VITAMIN D PO Take 1 tablet by mouth every other day.   Yes [provider]  olmesartan (BENICAR) 20 MG tablet Take 1 tablet (20 mg total) by mouth daily. Patient not taking: Reported on 08/12/2021 03/19/20   [name], MD  ondansetron (ZOFRAN) 4 MG tablet Take 1 tablet (4 mg total) by mouth every 8 (eight)  hours as needed for nausea or vomiting. Patient not taking: Reported on 08/12/2021 08/11/21   Eustaquio Maize, PA-C      Allergies    Hydralazine, Labetalol, and Valsartan    Review of Systems   Review of Systems  Constitutional:  Positive for appetite change.  Respiratory:  Positive for cough and shortness of breath.   Neurological:  Positive for weakness.  All other systems reviewed and are negative.  Physical Exam Updated Vital Signs BP 132/82    Pulse 90    Temp 98.3 F (36.8 C) (Oral)    Resp 17    Ht 5\' 8"  (1.727 m)    Wt 79.4 kg    SpO2 97%    BMI 26.61 kg/m  Physical Exam Vitals and nursing note reviewed.  Constitutional:      Appearance: He is well-developed.  HENT:     Head: Normocephalic and atraumatic.     Mouth/Throat:     Mouth: Mucous membranes are moist.  Eyes:     Pupils: Pupils are equal, round, and reactive to light.  Cardiovascular:     Rate and Rhythm: Normal rate. Rhythm irregular.  Pulmonary:     Effort: Pulmonary effort is normal.     Breath sounds: Rhonchi present.  Musculoskeletal:        General: Normal range of motion.  Cervical back: Normal range of motion and neck supple.  Skin:    General: Skin is warm.     Capillary Refill: Capillary refill takes less than 2 seconds.  Neurological:     General: No focal deficit present.     Mental Status: He is alert and oriented to person, place, and time.  Psychiatric:        Mood and Affect: Mood normal.        Behavior: Behavior normal.    ED Results / Procedures / Treatments   Labs (all labs ordered are listed, but only abnormal results are displayed) Labs Reviewed  COMPREHENSIVE METABOLIC PANEL - Abnormal; Notable for the following components:      Result Value   Sodium 133 (*)    Potassium 3.1 (*)    CO2 21 (*)    Glucose, Bld 140 (*)    BUN 25 (*)    Creatinine, Ser 1.29 (*)    Calcium 8.7 (*)    Albumin 3.4 (*)    AST 50 (*)    GFR, Estimated 59 (*)    All other components  within normal limits  CBC WITH DIFFERENTIAL/PLATELET - Abnormal; Notable for the following components:   MCH 35.0 (*)    MCHC 36.1 (*)    All other components within normal limits  TROPONIN I (HIGH SENSITIVITY) - Abnormal; Notable for the following components:   Troponin I (High Sensitivity) 44 (*)    All other components within normal limits  CULTURE, BLOOD (ROUTINE X 2)  CULTURE, BLOOD (ROUTINE X 2)  APTT  PROTIME-INR  LACTIC ACID, PLASMA  LACTIC ACID, PLASMA  MAGNESIUM  TROPONIN I (HIGH SENSITIVITY)    EKG EKG Interpretation  Date/Time:  Wednesday August 12 2021 13:56:09 EST Ventricular Rate:  113 PR Interval:    QRS Duration: 100 QT Interval:  328 QTC Calculation: 449 R Axis:   80 Text Interpretation: Atrial fibrillation with rapid ventricular response Nonspecific ST abnormality Abnormal QRS-T angle, consider primary T wave abnormality Abnormal ECG When compared with ECG of 11-Aug-2021 16:33, PREVIOUS ECG IS PRESENT No significant change since last tracing Confirmed by Isla Pence [phone]) on 08/12/2021 2:53:47 PM  Radiology DG Chest 2 View  Result Date: 08/12/2021 CLINICAL DATA:  Shortness of breath for 1 week. EXAM: CHEST - 2 VIEW COMPARISON:  Chest radiograph 08/11/2021. FINDINGS: Stable cardiac and mediastinal contours with stent material in the thoracic aorta. Increased consolidation within the right lower lung. No pleural effusion or pneumothorax. Thoracic spine degenerative changes. IMPRESSION: Increasing right lower lung consolidation which may represent pneumonia in the appropriate clinical setting. Followup PA and lateral chest X-ray is recommended in 3-4 weeks following trial of antibiotic therapy to ensure resolution and exclude underlying malignancy. Electronically Signed   By: Lovey Newcomer M.D.   On: 08/12/2021 14:58   DG Chest Port 1 View  Result Date: 08/11/2021 CLINICAL DATA:  Short of breath, fever EXAM: PORTABLE CHEST 1 VIEW COMPARISON:  Prior chest  x-ray 02/02/2020 FINDINGS: Stable postsurgical changes of prior TEVAR. Cardiomegaly and mediastinal contours remain unchanged. Right perihilar atelectasis versus scarring appears similar compared to prior images. No focal airspace infiltrate, pulmonary edema, pleural effusion or pneumothorax. IMPRESSION: No active disease. Stable cardiomegaly and right perihilar atelectasis versus scarring. Surgical changes of prior TEVAR. Electronically Signed   By: Jacqulynn Cadet M.D.   On: 08/11/2021 16:42    Procedures Procedures    Medications Ordered in ED Medications  cefTRIAXone (ROCEPHIN) 1 g in sodium chloride  0.9 % 100 mL IVPB (has no administration in time range)  azithromycin (ZITHROMAX) 500 mg in sodium chloride 0.9 % 250 mL IVPB (500 mg Intravenous New Bag/Given 08/12/21 1701)  ondansetron (ZOFRAN) injection 4 mg (has no administration in time range)  potassium chloride SA (KLOR-CON M) CR tablet 40 mEq (has no administration in time range)  sodium chloride 0.9 % bolus 1,000 mL (1,000 mLs Intravenous New Bag/Given 08/12/21 1702)    ED Course/ Medical Decision Making/ A&P       CHA2DS2-VASc Score: 3                    Medical Decision Making Amount and/or Complexity of Data Reviewed Labs: ordered. Radiology: ordered.  Risk Prescription drug management. Decision regarding hospitalization.   This patient presents to the ED for concern of sob, this involves an extensive number of treatment options, and is a complaint that carries with it a high risk of complications and morbidity.  The differential diagnosis includes afib, pna   Co morbidities that complicate the patient evaluation  afib   Additional history obtained:  Additional history obtained from epic chart review External records from outside source obtained and reviewed including daughter   Lab Tests:  I Ordered, and personally interpreted labs.  The pertinent results include:  cbc nl, cmp with mild hypokalemia (k 3.1),  cr with a slight bump at 1.29   Imaging Studies ordered:  I ordered imaging studies including cxr  I independently visualized and interpreted imaging which showed rll pna I agree with the radiologist interpretation   Cardiac Monitoring:  The patient was maintained on a cardiac monitor.  I personally viewed and interpreted the cardiac monitored which showed an underlying rhythm of: afib   Medicines ordered and prescription drug management:  I ordered medication including rocephin/zithromax  for pneumonia.  IVFs helped rate improve  Reevaluation of the patient after these medicines showed that the patient improved I have reviewed the patients home medicines and have made adjustments as needed  Critical Interventions:  Fluids/abx   Consultations Obtained:  I requested consultation with the fp residents,  and discussed lab and imaging findings as well as pertinent plan - they recommend: admission   Problem List / ED Course:  CAP:  pt given rocephin and zithromax.  Pt ambulated and O2 sats dropped to 85%.  Pt placed on 2L oxygen.  O2 nl at rest   Reevaluation:  After the interventions noted above, I reevaluated the patient and found that they have :improved    Dispostion:  After consideration of the diagnostic results and the patients response to treatment, I feel that the patent would benefit from admission.          Final Clinical Impression(s) / ED Diagnoses Final diagnoses:  Community acquired pneumonia of right lower lobe of lung  Acute respiratory failure with hypoxia ([place])  Rate controlled atrial fibrillation ([place])  Hypokalemia    Rx / DC Orders ED Discharge Orders     None         Isla Pence, MD 08/12/21 1727

## 2021-08-12 NOTE — ED Triage Notes (Signed)
Patient here with complaint of shortness of breath that started one week ago. Seen recently in [place] for afib RVR. HR in triage today 113. Patient denies pain, is alert, oriented, and in no apparent distress at this time.

## 2021-08-12 NOTE — H&P (Addendum)
[facility] Admission History and Physical Service Pager: [phone]  Patient name: Scott [name] Medical record number: [id number] Date of birth: 1950/10/04 Age: 71 y.o. Gender: male  Primary Care Provider: Pcp, No Consultants: none Code Status: FULL Preferred Emergency Contact: Si Raider, daughter Contact Information     Name Relation Home Work Crowder Daughter [phone]  [phone]   mourer,cindy Daughter [phone]          Chief Complaint: Fever and Shortness of breath  Assessment and Plan: Scott [name] is a 71 y.o. male presenting with 5 days of fever, chills, malaise, and shortness of breath.  Found to have new A-fib 1 day ago now with right middle lobe pneumonia. PMH is significant for aortic dissection status post repair (2021) and hypertension.  Fever   Shortness of Breath   Pneumonia Patient presenting with several days of fever, body aches, and shortness of breath.  [facility] was seen in the [facility] 1 day ago and diagnosed with new atrial fibrillation but sought care here today as he still felt ill.  Afebrile on arrival to the ED but tachycardic to 105. Tmax at home 101.1. Admission labs significant for WBC wnl at 8.1, K 3.1, Cr 1.29 (BL 1.1).  EKG showed A-fib with RVR with a rate of 113.  CXR with increasing right lower lung consolidation concerning for pneumonia. Patient was initially stable on room air but desatted while ambulating in the ED and we were therefore consulted for admission.  He was started on ceftriaxone and azithromycin in the emergency department. His presentation is highly suspicious for a bacterial pneumonia which could also be a trigger for his new A-fib.  Viral etiology of his fevers is also possible although COVID and flu testing obtained in the [facility], ED yesterday was negative.  Considered also heart failure as a cause of shortness of breath given his new A-fib but he has no evidence of volume overload on  exam. -Admit to med telemetry, Dr. McDiarmid attending -Continue antibiotics for CAP coverage, can likely transition to orals tomorrow -Wean O2 as tolerated -Will check a strep pneumo and Legionella urine antigen -Incentive spirometer -Follow-up blood cultures obtained in the ED -A.m. CBC, CMP, mag  A Fib with RVR HR 113 on admission EKG.  This is a new diagnosis as of yesterday.  He was given his first dose of Eliquis in the [facility] ED but not picked this up and has not started on an outpatient basis yet.  He is on a beta-blocker already for his history of aortic dissection, he takes labetalol 100 mg twice daily but has issues with itching with this med that interfere with adherence. -With new diagnosis of A-fib, will obtain echocardiogram -Cardiac monitoring -Due to issues with itching, will transition to Coreg 12.5mg  BID -Continue Eliquis 5 mg twice daily  History of aortic dissection s/p repair Hypertension Patient had thoracic aortic dissection 2021.  He follows with cardiology for his hypertension.  He currently takes amlodipine 10 mg once a day and labetalol 100 mg twice daily.  He was originally prescribed to take it 3 times daily but only takes it twice daily due to itching that he has with it.  Most recent BP here 132/82. -Continue amlodipine 10 mg daily -Transition to Coreg as above  Hypokalemia K 3.1 on admission.  Patient given 40 mEq KCl in the ED. -Trend on a.m. CMP  Elevated Troponin Trops 44>39. EKG without evidence of ischemic changes. Suspect demand ischemia  in the setting of A Fib with RVR.   FEN/GI: Regular Prophylaxis: On Eliquis  Disposition: Med telemetry  History of Present Illness:  [place] Scott [name] is a 71 y.o. male presenting with fever, chills, malaise, shortness of breath.  Patient started feeling bad 5 days ago.  He thought at first that he had COVID as he has had COVID several times and this "felt just like that".  He reports fever, chills, malaise, body  aches, shortness of breath just like he had with his COVID.  He presented to the [facility] ED yesterday and was found to have new onset A-fib.  COVID/flu testing were negative at the time.  He was given a dose of Eliquis and instructed to start taking this.  Chest x-ray obtained at the [facility] ED did not show evidence of a pneumonia.  This morning he still felt bad and elected to come to the [facility] ED "for a second opinion."    Denies cough. No recent travel or exposure to farm animals. History of smoking for ~30 years off and on, about 1 ppd. Drinks a pint of beer on occasion. Last drink 10 days ago.   Review Of Systems: Per HPI with the following additions:   Review of Systems  Constitutional:  Positive for chills, fatigue and fever.  Respiratory:  Positive for stridor. Negative for cough.   Cardiovascular:  Negative for palpitations.  Gastrointestinal:  Positive for nausea. Negative for vomiting.  Genitourinary:  Negative for difficulty urinating.  Neurological:  Positive for weakness.  All other systems reviewed and are negative.   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  Past Surgical History: Past Surgical History:  Procedure Laterality Date   ABDOMINAL AORTIC ENDOVASCULAR STENT GRAFT  02/02/2020    type b   APPENDECTOMY     THORACIC AORTIC ENDOVASCULAR STENT GRAFT N/A 02/02/2020   Procedure: REPAIR TYPE B THORACIC AORTIC ENDOVASCULAR STENT;  Surgeon: [name], MD;  Location: [facility] OR;  Service: Vascular;  Laterality: N/A;    Social History: Social  History   Tobacco Use   Smoking status: Former   Smokeless tobacco: Never  Vaping Use   Vaping Use: Never used  Substance Use Topics   Alcohol use: No   Drug use: No   Additional social history: Former smoker, quit a few years ago. Smoked 1 ppd for about 30 years intermittently.  Please also refer to relevant sections of EMR.  Family History: Family History  Problem Relation Age of Onset   Lung cancer Sister     Allergies and Medications: Allergies  Allergen Reactions   Hydralazine     Night terrors, fatigued   Labetalol Itching    No redness - tingling/itching. Takes 100mg  twice daily     Valsartan     Night terrors and fatigued   No current facility-administered medications on file prior to encounter.   Current Outpatient Medications on File Prior to Encounter  Medication Sig Dispense Refill   acetaminophen (TYLENOL) 325 MG tablet Take 975 mg by mouth every 6 (six) hours as needed  for mild pain.     amLODipine (NORVASC) 10 MG tablet Take 1 tablet (10 mg total) by mouth daily. 90 tablet 3   apixaban (ELIQUIS) 5 MG TABS tablet Take 1 tablet (5 mg total) by mouth 2 (two) times daily. 60 tablet 0   labetalol (NORMODYNE) 100 MG tablet Take 1 tablet (100 mg total) by mouth 3 (three) times daily. CALL OFFICE FOR APPOINTMENT, NO MORE REFILLS UNTIL SEEN (Patient taking differently: Take 100 mg by mouth 2 (two) times daily.) 90 tablet 1   Multiple Vitamin (MULTIVITAMIN) tablet Take 1 tablet by mouth every other day.     sodium chloride (OCEAN) 0.65 % SOLN nasal spray Place 1 spray into both nostrils 3 (three) times daily as needed for congestion.     VITAMIN D PO Take 1 tablet by mouth every other day.     olmesartan (BENICAR) 20 MG tablet Take 1 tablet (20 mg total) by mouth daily. (Patient not taking: Reported on 08/12/2021) 30 tablet 5   ondansetron (ZOFRAN) 4 MG tablet Take 1 tablet (4 mg total) by mouth every 8 (eight) hours as needed for nausea or vomiting. (Patient not taking:  Reported on 08/12/2021) 20 tablet 0    Objective: BP 132/82    Pulse 90    Temp 98.3 F (36.8 C) (Oral)    Resp 17    Ht 5\' 8"  (1.727 m)    Wt 79.4 kg    SpO2 97%    BMI 26.61 kg/m  Exam: General: Awake, alert, well-nourished, NAD Eyes: EOMs intact, anicteric ENTM: MMM Neck: Without LAD or thyromegaly Cardiovascular: Irregularly irregular, intermittently tachycardic on monitor, RML/RLL with soft crackles Respiratory: Normal WOB on 2L [place] Gastrointestinal: Soft, non-tender, non-distended MSK: Without edema or deformity Derm: Warm and well-perfused Neuro: Without focal deficit Psych: Mood and affect are normal  Labs and Imaging: CBC BMET  Recent Labs  Lab 08/12/21 1417  WBC 8.1  HGB 15.7  HCT 43.5  PLT 179   Recent Labs  Lab 08/12/21 1417  NA 133*  K 3.1*  CL 100  CO2 21*  BUN 25*  CREATININE 1.29*  GLUCOSE 140*  CALCIUM 8.7*     EKG: Atrial Fibriallation with RVR, rate 113 bpm   DG Chest 2 View CLINICAL DATA:  Shortness of breath for 1 week.  EXAM: CHEST - 2 VIEW  COMPARISON:  Chest radiograph 08/11/2021.  FINDINGS: Stable cardiac and mediastinal contours with stent material in the thoracic aorta. Increased consolidation within the right lower lung. No pleural effusion or pneumothorax. Thoracic spine degenerative changes.  IMPRESSION: Increasing right lower lung consolidation which may represent pneumonia in the appropriate clinical setting. Followup PA and lateral chest X-ray is recommended in 3-4 weeks following trial of antibiotic therapy to ensure resolution and exclude underlying malignancy.  Electronically Signed   By: Lovey Newcomer M.D.   On: 08/12/2021 14:58    Eppie Gibson, MD 08/12/2021, 5:13 PM PGY-1, [place] Intern pager: [phone], text pages welcome  Patient presents with acute hypoxemic respiratory failure secondary to pneumonia. Presents with fever and dyspnea with radiologic evidence of pneumonia with  minimal O2 requirement. Continue antibiotics, transition to PO with clinical improvement likely tomorrow. For recent diagnosis of A Fib, rate controlled with home beta blocker currently and will continue anticoagulation (CHADS-VASc score 2). Will check TTE and thyroid function. Possible discharge home tomorrow.  I was personally present and performed or re-performed the history, physical exam and medical decision making  activities of this service and have verified that the service and findings are accurately documented in the residents note.  Zola Button, MD                  08/12/2021, 6:55 PM

## 2021-08-12 NOTE — ED Triage Notes (Signed)
Pt returns to ED "because he's just not getting any better". Pt reports diarrhea last pm, no vomiting. Pt took Tylenol this morning. Pt is diaphoretic in triage. Pt is a difficult historian.

## 2021-08-12 NOTE — Progress Notes (Addendum)
FPTS Brief Progress Note  S:Patient with wife at bedside, reports that he thinks he may have a fever as he is starting to feel flushing of his face.   O: BP 135/85    Pulse (!) 108    Temp 98.3 F (36.8 C) (Oral)    Resp 18    Ht 5\' 8"  (1.727 m)    Wt 79.4 kg    SpO2 97%    BMI 26.61 kg/m   General: elderly appearing, NAD, family at bedside CV: tachycardic on telemetry Respiratory: breathing comfortably on 1L [place]  A/P: CAP - Orders reviewed. Labs for AM ordered, which was adjusted as needed.  - Continuing azithromycin (s/p 1 dose CTX) - O2 supplementation to maintain saturations - Tylenol for fevers  Scott [name], Lorrin Goodell, DO 08/12/2021, 8:41 PM PGY-2, [facility] Night Resident  Please page [phone] with questions.

## 2021-08-12 NOTE — ED Notes (Signed)
Patient transported to X-ray 

## 2021-08-12 NOTE — ED Notes (Signed)
While ambulating in room pt O2 85%-88%. Pt had audible breath sounds and stated he felt like he was getting tired. While lying in bed at rest pt O2 100%.

## 2021-08-13 ENCOUNTER — Other Ambulatory Visit (HOSPITAL_COMMUNITY): Payer: Self-pay

## 2021-08-13 ENCOUNTER — Inpatient Hospital Stay (HOSPITAL_COMMUNITY): Payer: No Typology Code available for payment source

## 2021-08-13 DIAGNOSIS — J189 Pneumonia, unspecified organism: Principal | ICD-10-CM

## 2021-08-13 DIAGNOSIS — I4891 Unspecified atrial fibrillation: Secondary | ICD-10-CM

## 2021-08-13 DIAGNOSIS — J9601 Acute respiratory failure with hypoxia: Secondary | ICD-10-CM | POA: Diagnosis not present

## 2021-08-13 DIAGNOSIS — I4819 Other persistent atrial fibrillation: Secondary | ICD-10-CM | POA: Insufficient documentation

## 2021-08-13 DIAGNOSIS — E876 Hypokalemia: Secondary | ICD-10-CM | POA: Diagnosis not present

## 2021-08-13 LAB — ECHOCARDIOGRAM COMPLETE
AR max vel: 2.77 cm2
AV Area VTI: 2.61 cm2
AV Area mean vel: 2.51 cm2
AV Mean grad: 3.5 mmHg
AV Peak grad: 6.4 mmHg
Ao pk vel: 1.26 m/s
Area-P 1/2: 4.63 cm2
Height: 68 in
P 1/2 time: 393 msec
S' Lateral: 4.6 cm
Weight: 2800 oz

## 2021-08-13 LAB — COMPREHENSIVE METABOLIC PANEL
ALT: 35 U/L (ref 0–44)
AST: 46 U/L — ABNORMAL HIGH (ref 15–41)
Albumin: 2.7 g/dL — ABNORMAL LOW (ref 3.5–5.0)
Alkaline Phosphatase: 57 U/L (ref 38–126)
Anion gap: 10 (ref 5–15)
BUN: 19 mg/dL (ref 8–23)
CO2: 19 mmol/L — ABNORMAL LOW (ref 22–32)
Calcium: 8 mg/dL — ABNORMAL LOW (ref 8.9–10.3)
Chloride: 103 mmol/L (ref 98–111)
Creatinine, Ser: 1.06 mg/dL (ref 0.61–1.24)
GFR, Estimated: >60 mL/min (ref >60)
Glucose, Bld: 107 mg/dL — ABNORMAL HIGH (ref 70–99)
Potassium: 3.5 mmol/L (ref 3.5–5.1)
Sodium: 132 mmol/L — ABNORMAL LOW (ref 135–145)
Total Bilirubin: 0.6 mg/dL (ref 0.3–1.2)
Total Protein: 5.5 g/dL — ABNORMAL LOW (ref 6.5–8.1)

## 2021-08-13 LAB — CBC
HCT: 37.3 % — ABNORMAL LOW (ref 39.0–52.0)
Hemoglobin: 13.4 g/dL (ref 13.0–17.0)
MCH: 34.7 pg — ABNORMAL HIGH (ref 26.0–34.0)
MCHC: 35.9 g/dL (ref 30.0–36.0)
MCV: 96.6 fL (ref 80.0–100.0)
Platelets: 160 10*3/uL (ref 150–400)
RBC: 3.86 MIL/uL — ABNORMAL LOW (ref 4.22–5.81)
RDW: 12.5 % (ref 11.5–15.5)
WBC: 5.8 10*3/uL (ref 4.0–10.5)
nRBC: 0 % (ref 0.0–0.2)

## 2021-08-13 LAB — STREP PNEUMONIAE URINARY ANTIGEN: Strep Pneumo Urinary Antigen: NEGATIVE

## 2021-08-13 LAB — LACTIC ACID, PLASMA: Lactic Acid, Venous: 1.1 mmol/L (ref 0.5–1.9)

## 2021-08-13 LAB — MAGNESIUM
Magnesium: 2 mg/dL (ref 1.7–2.4)
Magnesium: 2.1 mg/dL (ref 1.7–2.4)

## 2021-08-13 MED ORDER — SODIUM CHLORIDE 0.9 % IV SOLN
1.0000 g | Freq: Once | INTRAVENOUS | Status: AC
  Administered 2021-08-13: 1 g via INTRAVENOUS
  Filled 2021-08-13: qty 10

## 2021-08-13 MED ORDER — CEFDINIR 300 MG PO CAPS
300.0000 mg | ORAL_CAPSULE | Freq: Two times a day (BID) | ORAL | Status: DC
  Filled 2021-08-13: qty 1

## 2021-08-13 MED ORDER — APIXABAN 5 MG PO TABS
5.0000 mg | ORAL_TABLET | Freq: Two times a day (BID) | ORAL | 0 refills | Status: DC
  Filled 2021-08-13: qty 60, 30d supply, fill #0

## 2021-08-13 MED ORDER — AZITHROMYCIN 250 MG PO TABS
500.0000 mg | ORAL_TABLET | Freq: Every day | ORAL | Status: DC
  Administered 2021-08-13: 500 mg via ORAL
  Filled 2021-08-13: qty 2

## 2021-08-13 MED ORDER — CEFDINIR 300 MG PO CAPS
300.0000 mg | ORAL_CAPSULE | Freq: Two times a day (BID) | ORAL | 0 refills | Status: AC
  Filled 2021-08-13: qty 6, 3d supply, fill #0

## 2021-08-13 MED ORDER — CARVEDILOL 12.5 MG PO TABS
12.5000 mg | ORAL_TABLET | Freq: Two times a day (BID) | ORAL | 0 refills | Status: DC
  Filled 2021-08-13: qty 60, 30d supply, fill #0

## 2021-08-13 MED ORDER — CEFDINIR 300 MG PO CAPS: 300.0000 mg | ORAL_CAPSULE | Freq: Two times a day (BID) | ORAL | Status: DC

## 2021-08-13 MED ORDER — CARVEDILOL 3.125 MG PO TABS
12.5000 mg | ORAL_TABLET | Freq: Two times a day (BID) | ORAL | Status: DC
  Administered 2021-08-13: 12.5 mg via ORAL
  Filled 2021-08-13: qty 4

## 2021-08-13 MED ORDER — AZITHROMYCIN 500 MG PO TABS
500.0000 mg | ORAL_TABLET | Freq: Once | ORAL | 0 refills | Status: AC
  Filled 2021-08-13: qty 3, 3d supply, fill #0

## 2021-08-13 NOTE — Progress Notes (Signed)
Family Medicine Teaching Service Daily Progress Note Intern Pager: [phone]  Patient name: Scott [name] Medical record number: [id number] Date of birth: Nov 24, 1950 Age: 71 y.o. Gender: male  Primary Care Provider: Pcp, No Consultants: None Code Status: Full  Pt Overview and Major Events to Date:  2/22- admitted  Assessment and Plan: Scott [name] is a 71 y.o. male presenting with 5 days of fever, chills, malaise, and shortness of breath.  Found to have new A-fib and right middle lobe pneumonia. PMH is significant for aortic dissection status post repair (2021) and hypertension.  CAP  Patient requiring 1L [place] overnight. Weaned successfully to RA during interview. Remained afebrile though somewhat tachypneic to low-mid 20s. S pneumo urine antigen negative. Legionella pending. Blood cultures pending, - Transition to PO azithromycin and cefdinir - Wean O2 as tolerated - Incentive spirometer - Follow up blood cx - Will ambulate with pulse ox. If tolerates, could discharge home today.   A Fib with RVR HR remained around 100 overnight. We transitioned him from labetalol to Coreg yesterday due to concern for itching with labetalol. - Echo pending - Cardiac monitoring - Initiate Coreg to 12.5mg  BID - Continue Eliquis 5mg  BID  Hx of aortic dissection s/p repair Hypertension BP overnight 100s-140s/60s-100s. Last BP charted 135/85.  - Continue amlodipine 10mg  daily - Initiate Coreg as above  Hypokalemia, resolved K 3.5 today.   FEN/GI: Regular PPx: On Eliquis Dispo:Home today vs tomorrow. Barriers include echo and Beta blocker titration.   Subjective:  Scott [name] reports feeling quite well this morning.  He would very much like to go home today.  His brother recently passed away and the funeral is tomorrow and he feels very strongly about getting home for the funeral.  He reports that he feels strong and up to going.  His only complaint is some continued rapid heart rate.  Objective: Temp:   [98.1 F (36.7 C)-99 F (37.2 C)] 99 F (37.2 C) (02/22 2040) Pulse Rate:  [52-124] 99 (02/23 0420) Resp:  [17-30] 22 (02/23 0420) BP: (109-153)/(69-106) 135/85 (02/23 0400) SpO2:  [94 %-99 %] 97 % (02/23 0420) Weight:  [79.4 kg] 79.4 kg (02/22 1504) Physical Exam: General: Sleeping on arrival, awoke easily to voice, NAD Cardiovascular: Mildly tachycardic, irregularly irregular, no murmur Respiratory: Normal work of breathing on room air, no crackles appreciated today Abdomen: Nontender, nondistended Extremities: Without edema or deformity  Laboratory: Recent Labs  Lab 08/11/21 1628 08/12/21 1417 08/13/21 0420  WBC 8.9 8.1 5.8  HGB 14.8 15.7 13.4  HCT 42.6 43.5 37.3*  PLT 170 179 160   Recent Labs  Lab 08/11/21 1628 08/12/21 1417 08/13/21 0420  NA 131* 133* 132*  K 3.3* 3.1* 3.5  CL 100 100 103  CO2 22 21* 19*  BUN 29* 25* 19  CREATININE 1.19 1.29* 1.06  CALCIUM 8.6* 8.7* 8.0*  PROT 6.7 6.6 5.5*  BILITOT 0.9 0.9 0.6  ALKPHOS 66 68 57  ALT 24 37 35  AST 33 50* 46*  GLUCOSE 126* 140* 107*    Imaging/Diagnostic Tests: DG Chest 2 View CLINICAL DATA:  Shortness of breath for 1 week.  EXAM: CHEST - 2 VIEW  COMPARISON:  Chest radiograph 08/11/2021.  FINDINGS: Stable cardiac and mediastinal contours with stent material in the thoracic aorta. Increased consolidation within the right lower lung. No pleural effusion or pneumothorax. Thoracic spine degenerative changes.  IMPRESSION: Increasing right lower lung consolidation which may represent pneumonia in the appropriate clinical setting. Followup PA and lateral chest X-ray  is recommended in 3-4 weeks following trial of antibiotic therapy to ensure resolution and exclude underlying malignancy.  Electronically Signed   By: Lovey Newcomer M.D.   On: 08/12/2021 14:58    Scott Gibson, MD 08/13/2021, 6:24 AM PGY-1, [facility] Intern pager: [phone], text pages welcome

## 2021-08-13 NOTE — Progress Notes (Signed)
Echocardiogram 2D Echocardiogram has been performed.  Scott [name] 08/13/2021, 1:58 PM

## 2021-08-13 NOTE — ED Notes (Signed)
Patient ambulated well in hallway. Patient's O2 fluctuated between 94% and 95%. No complaints of SOB or dizziness reported. RN is aware.

## 2021-08-13 NOTE — Discharge Instructions (Addendum)
Dear Scott [name],   Thank you so much for allowing [place] to be part of your care!  You were admitted to [facility] for pneumonia. You were treated with oxygen and antibiotics. We also adjusted your blood pressure/A Fib meds. We discontinued your labetalol and started a medication called carvedilol. Hopefully this will decrease the issues you are having with itching.    POST-HOSPITAL & CARE INSTRUCTIONS Make sure to finish taking the antibiotic pills as instructed. Please make sure to schedule follow-up with your cardiologist for atrial fibrillation. Please let PCP/Specialists know of any changes that were made.  Please see medications section of this packet for any medication changes.   DOCTOR'S APPOINTMENT & FOLLOW UP CARE INSTRUCTIONS  Future Appointments  Date Time Provider [place]  08/18/2021  1:30 PM Scott Chandler, NP [facility] None    RETURN PRECAUTIONS: Call your doctor or return to the ED if you develop worsening shortness of breath.  Take care and be well!  [facility]  [place], [place] [address] [phone]

## 2021-08-13 NOTE — Hospital Course (Addendum)
Community Acquired Pneumonia Patient presented to the [facility] on 2/22 with 5 days of fever, chills, malaise, and shortness of breath. On arrival he was tachycardic but afebrile, though he reported Tmax at home to 101.1. Admission labs were significant for WBC wnl at 8.1. Cr bumped slightly to 1.29 up from baseline 1.1. CXR showed increasing RLL consolidation concerning for pneumonia. He was started on Azithromycin and Ceftriaxone for CAP coverage. He rapidly stabilized and was stable on room air by hospital day 2. He was discharged with oral azithromycin and cefdinir and instructions to finish a total five day course.   A Fib with RVR Hypertension   Hx of Aortic Dissection This is new diagnosis for this patient. He was diagnosed the day prior to arrival and had not been able to pick up his Eliquis prior to arrival. His HR on arrival was 113. EKG did not show any ischemic changes. Troponins on admission were 44>39. He was on labetalol prior to arrival but did not take it as directed due to itching/skin crawling sensation with this medication. We therefore transitioned him from labetalol to carvedilol.  Given the new diagnosis of A Fib, we obtained an echocardiogram which showed LVEF 55-60% with mild LVH and elevated L atrial pressure. L atrium was mildly dilated, R atrium severely dilated. Patient was discharged on Eliquis 5mg , Coreg 12.5mg  BID, and amlodipine 10mg  BID. Adequate rate control was achieved prior to hospital discharge.   Follow-up recommendations: Repeat CXR in 3-4 weeks to ensure resolution and exclude underlying malignancy Patient will need to call his cardiologist to follow up for his new A Fib. We are discharging him on Eliquis 5mg  BID and Coreg 12.5 mg BID.  Monitor HR/BP and consider dose-adjustment of his Coreg

## 2021-08-13 NOTE — Discharge Summary (Signed)
[facility] Discharge Summary  Patient name: Scott [name] Medical record number: [id number] Date of birth: [birth year] Age: 71 y.o. Gender: male Date of Admission: 08/12/2021  Date of Discharge: 08/13/2021 Admitting Physician: Lissa Morales, MD  Primary Care Provider: Pcp, No Consultants: None  Indication for Hospitalization: Fever, Shortness of Breath  Discharge Diagnoses/Problem List:  Principal Problem:   Pneumonia   A Fib RVR   Hypertension  Disposition: Home  Discharge Condition: Stable, at baseline  Discharge Exam:  From my same day progress note: General: Sleeping on arrival, awoke easily to voice, NAD Cardiovascular: Mildly tachycardic, irregularly irregular, no murmur Respiratory: Normal work of breathing on room air, no crackles appreciated today Abdomen: Nontender, nondistended Extremities: Without edema or deformity  Brief Hospital Course:  Community Acquired Pneumonia Patient presented to the [facility] on 2/22 with 5 days of fever, chills, malaise, and shortness of breath. On arrival he was tachycardic but afebrile, though he reported Tmax at home to 101.1. Admission labs were significant for WBC wnl at 8.1. Cr bumped slightly to 1.29 up from baseline 1.1. CXR showed increasing RLL consolidation concerning for pneumonia. He was started on Azithromycin and Ceftriaxone for CAP coverage. He rapidly stabilized and was stable on room air by hospital day 2. He was discharged with oral azithromycin and cefdinir and instructions to finish a total five day course.   A Fib with RVR Hypertension   Hx of Aortic Dissection This is new diagnosis for this patient. He was diagnosed the day prior to arrival and had not been able to pick up his Eliquis prior to arrival. His HR on arrival was 113. EKG did not show any ischemic changes. Troponins on admission were 44>39. He was on labetalol prior to arrival but did not take it as directed due to itching/skin crawling  sensation with this medication. We therefore transitioned him from labetalol to carvedilol.  Given the new diagnosis of A Fib, we obtained an echocardiogram which showed LVEF 55-60% with mild LVH and elevated L atrial pressure. L atrium was mildly dilated, R atrium severely dilated. Patient was discharged on Eliquis 5mg , Coreg 12.5mg  BID, and amlodipine 10mg  BID. Adequate rate control was achieved prior to hospital discharge.   Follow-up recommendations: Repeat CXR in 3-4 weeks to ensure resolution and exclude underlying malignancy Patient will need to call his cardiologist to follow up for his new A Fib. We are discharging him on Eliquis 5mg  BID and Coreg 12.5 mg BID.  Monitor HR/BP and consider dose-adjustment of his Coreg   Significant Procedures: None  Significant Labs and Imaging:  Recent Labs  Lab 08/11/21 1628 08/12/21 1417 08/13/21 0420  WBC 8.9 8.1 5.8  HGB 14.8 15.7 13.4  HCT 42.6 43.5 37.3*  PLT 170 179 160   Recent Labs  Lab 08/11/21 1628 08/12/21 1417 08/12/21 2339 08/13/21 0420  NA 131* 133*  --  132*  K 3.3* 3.1*  --  3.5  CL 100 100  --  103  CO2 22 21*  --  19*  GLUCOSE 126* 140*  --  107*  BUN 29* 25*  --  19  CREATININE 1.19 1.29*  --  1.06  CALCIUM 8.6* 8.7*  --  8.0*  MG 2.1  --  2.1 2.0  ALKPHOS 66 68  --  57  AST 33 50*  --  46*  ALT 24 37  --  35  ALBUMIN 3.5 3.4*  --  2.7*   ECHOCARDIOGRAM COMPLETE    ECHOCARDIOGRAM  REPORT       Patient Name:   Scott [name] Date of Exam: 08/13/2021 Medical Rec #:  [id number]  Height:       68.0 in Accession #:    [id number] Weight:       175.0 lb Date of Birth:  [birth year]  BSA:          1.931 m Patient Age:    71 years   BP:           128/93 mmHg Patient Gender: M          HR:           99 bpm. Exam Location:  Inpatient  Procedure: 2D Echo  Indications:    Atrial fibrillation   History:        Patient has prior history of Echocardiogram examinations, most                 recent 01/29/2020. Risk  Factors:Hypertension. History of aortic                 dissection and repair.   Sonographer:    Arlyss Gandy Referring Phys: Blair   1. Left ventricular ejection fraction, by estimation, is 55 to 60%. The left ventricle has normal function. The left ventricle has no regional wall motion abnormalities. The left ventricular internal cavity size was mildly dilated. There is mild left  ventricular hypertrophy. Left ventricular diastolic function could not be evaluated. Elevated left atrial pressure.  2. Right ventricular systolic function is normal. The right ventricular size is normal. There is normal pulmonary artery systolic pressure.  3. Left atrial size was mildly dilated.  4. Right atrial size was severely dilated.  5. The mitral valve is normal in structure. Trivial mitral valve regurgitation. No evidence of mitral stenosis.  6. The aortic valve is tricuspid. Aortic valve regurgitation is mild. No aortic stenosis is present.  7. Aortic dilatation noted. There is mild dilatation of the aortic root, measuring 40 mm.  8. The inferior vena cava is normal in size with greater than 50% respiratory variability, suggesting right atrial pressure of 3 mmHg.  FINDINGS  Left Ventricle: Left ventricular ejection fraction, by estimation, is 55 to 60%. The left ventricle has normal function. The left ventricle has no regional wall motion abnormalities. The left ventricular internal cavity size was mildly dilated. There is  mild left ventricular hypertrophy. Left ventricular diastolic function could not be evaluated due to atrial fibrillation. Left ventricular diastolic function could not be evaluated. Elevated left atrial pressure.  Right Ventricle: The right ventricular size is normal. Right ventricular systolic function is normal. There is normal pulmonary artery systolic pressure. The tricuspid regurgitant velocity is 2.42 m/s, and with an assumed right atrial pressure of  3 mmHg,  the estimated right ventricular systolic pressure is 96.7 mmHg.  Left Atrium: Left atrial size was mildly dilated.  Right Atrium: Right atrial size was severely dilated.  Pericardium: There is no evidence of pericardial effusion.  Mitral Valve: The mitral valve is normal in structure. Mild mitral annular calcification. Trivial mitral valve regurgitation. No evidence of mitral valve stenosis.  Tricuspid Valve: The tricuspid valve is normal in structure. Tricuspid valve regurgitation is mild . No evidence of tricuspid stenosis.  Aortic Valve: The aortic valve is tricuspid. Aortic valve regurgitation is mild. Aortic regurgitation PHT measures 393 msec. No aortic stenosis is present. Aortic valve mean gradient measures 3.5 mmHg. Aortic valve peak gradient measures 6.4  mmHg. Aortic  valve area, by VTI measures 2.61 cm.  Pulmonic Valve: The pulmonic valve was normal in structure. Pulmonic valve regurgitation is trivial. No evidence of pulmonic stenosis.  Aorta: Aortic dilatation noted. There is mild dilatation of the aortic root, measuring 40 mm.  Venous: The inferior vena cava is normal in size with greater than 50% respiratory variability, suggesting right atrial pressure of 3 mmHg.  IAS/Shunts: No atrial level shunt detected by color flow Doppler.    LEFT VENTRICLE PLAX 2D LVIDd:         5.40 cm   Diastology LVIDs:         4.60 cm   LV e' medial:    7.62 cm/s LV PW:         1.20 cm   LV E/e' medial:  18.5 LV IVS:        1.20 cm   LV e' lateral:   8.92 cm/s LVOT diam:     2.10 cm   LV E/e' lateral: 15.8 LV SV:         59 LV SV Index:   31 LVOT Area:     3.46 cm    RIGHT VENTRICLE            IVC RV Basal diam:  4.00 cm    IVC diam: 2.00 cm RV Mid diam:    3.00 cm RV S prime:     8.92 cm/s TAPSE (M-mode): 1.9 cm  LEFT ATRIUM              Index        RIGHT ATRIUM           Index LA diam:        4.50 cm  2.33 cm/m   RA Area:     22.00 cm LA Vol (A2C):   136.0 ml  70.43 ml/m  RA Volume:   63.30 ml  32.78 ml/m LA Vol (A4C):   98.0 ml  50.75 ml/m LA Biplane Vol: 117.0 ml 60.59 ml/m  AORTIC VALVE AV Area (Vmax):    2.77 cm AV Area (Vmean):   2.51 cm AV Area (VTI):     2.61 cm AV Vmax:           126.00 cm/s AV Vmean:          89.600 cm/s AV VTI:            0.226 m AV Peak Grad:      6.4 mmHg AV Mean Grad:      3.5 mmHg LVOT Vmax:         100.65 cm/s LVOT Vmean:        64.950 cm/s LVOT VTI:          0.171 m LVOT/AV VTI ratio: 0.75 AI PHT:            393 msec   AORTA Ao Root diam: 4.00 cm Ao Asc diam:  3.60 cm  MITRAL VALVE                TRICUSPID VALVE MV Area (PHT): 4.63 cm     TR Peak grad:   23.4 mmHg MV Decel Time: 164 msec     TR Vmax:        242.00 cm/s MV E velocity: 141.00 cm/s                             SHUNTS  Systemic VTI:  0.17 m                             Systemic Diam: 2.10 cm  Kirk Ruths MD Electronically signed by Kirk Ruths MD Signature Date/Time: 08/13/2021/2:09:26 PM      Final     Results/Tests Pending at Time of Discharge: TSH, free T4, legionella urine antigen  Discharge Medications:  Allergies as of 08/13/2021       Reactions   Hydralazine    Night terrors, fatigued   Labetalol Itching   No redness - tingling/itching. Takes 100mg  twice daily     Valsartan    Night terrors and fatigued        Medication List     STOP taking these medications    labetalol 100 MG tablet Commonly known as: NORMODYNE   olmesartan 20 MG tablet Commonly known as: BENICAR   ondansetron 4 MG tablet Commonly known as: Zofran       TAKE these medications    acetaminophen 325 MG tablet Commonly known as: TYLENOL Take 975 mg by mouth every 6 (six) hours as needed for mild pain.   amLODipine 10 MG tablet Commonly known as: NORVASC Take 1 tablet (10 mg total) by mouth daily.   azithromycin 500 MG tablet Commonly known as: ZITHROMAX Take 1 tablet (500 mg total) by  mouth once for 1 dose.   carvedilol 12.5 MG tablet Commonly known as: COREG Take 1 tablet (12.5 mg total) by mouth 2 (two) times daily with a meal.   cefdinir 300 MG capsule Commonly known as: OMNICEF Take 1 capsule (300 mg total) by mouth every 12 (twelve) hours for 3 days.   Eliquis 5 MG Tabs tablet Generic drug: apixaban Take 1 tablet (5 mg total) by mouth 2 (two) times daily.   multivitamin tablet Take 1 tablet by mouth every other day.   sodium chloride 0.65 % Soln nasal spray Commonly known as: OCEAN Place 1 spray into both nostrils 3 (three) times daily as needed for congestion.   VITAMIN D PO Take 1 tablet by mouth every other day.        Discharge Instructions: Please refer to Patient Instructions section of EMR for full details.  Patient was counseled important signs and symptoms that should prompt return to medical care, changes in medications, dietary instructions, activity restrictions, and follow up appointments.   Follow-Up Appointments:  Follow-up Information     PCP Follow up.   Why: Go to appt Tuesday                Eppie Gibson, MD 08/13/2021, 2:33 PM PGY-1, [place]

## 2021-08-14 ENCOUNTER — Telehealth (HOSPITAL_COMMUNITY): Payer: Self-pay

## 2021-08-14 ENCOUNTER — Other Ambulatory Visit (HOSPITAL_COMMUNITY): Payer: Self-pay

## 2021-08-14 LAB — LEGIONELLA PNEUMOPHILA SEROGP 1 UR AG: L. pneumophila Serogp 1 Ur Ag: NEGATIVE

## 2021-08-14 NOTE — Telephone Encounter (Signed)
Pharmacy Transitions of Care Follow-up Telephone Call  Date of discharge: 08/14/2021  Discharge Diagnosis: Afib  How have you been since you were released from the hospital? Patient reports doing well. No questions or concerns at this time.   Medication changes made at discharge:  - START: Eliquis 5 mg BID  Medication changes verified by the patient? Yes (Yes/No)   Medication Accessibility: Home Pharmacy:  [facility]  Was the patient provided with refills on discharged medications? No   Have all prescriptions been transferred from [facility] to home pharmacy? N/A  Is the patient able to afford medications? Patient has [employer] insurance   Medication Review: APIXABAN (ELIQUIS)  Apixaban 5 mg BID initiated on 08/11/2021.  - Discussed importance of taking medication around the same time everyday  - Advised patient of medications to avoid (NSAIDs, ASA)  - Educated that Tylenol (acetaminophen) will be the preferred analgesic to prevent risk of bleeding  - Emphasized importance of monitoring for signs and symptoms of bleeding (abnormal bruising, prolonged bleeding, nose bleeds, bleeding from gums, discolored urine, black tarry stools)  - Advised patient to alert all providers of anticoagulation therapy prior to starting a new medication or having a procedure   Follow-up Appointments:  If their condition worsens, is the pt aware to call PCP or go to the Emergency Dept.? Yes  Final Patient Assessment: Patient was educated on Eliquis. Aware of BID dosing. He reports his daughter is trying to get him a PCP therefore no follow ups scheduled yet.

## 2021-08-17 LAB — CULTURE, BLOOD (ROUTINE X 2)
Culture: NO GROWTH
Culture: NO GROWTH
Special Requests: ADEQUATE

## 2021-08-18 ENCOUNTER — Ambulatory Visit: Payer: Self-pay | Admitting: Nurse Practitioner

## 2021-08-26 ENCOUNTER — Ambulatory Visit (HOSPITAL_COMMUNITY)
Admission: RE | Admit: 2021-08-26 | Discharge: 2021-08-26 | Disposition: A | Discharge status: Home / Self Care | Payer: Medicare PPO | Source: Ambulatory Visit | Attending: Physician Assistant | Admitting: Physician Assistant

## 2021-08-26 ENCOUNTER — Encounter (HOSPITAL_COMMUNITY): Payer: Self-pay | Admitting: Physician Assistant

## 2021-08-26 ENCOUNTER — Other Ambulatory Visit: Payer: Self-pay

## 2021-08-26 VITALS — BP 170/100 | HR 112 | Ht 68.0 in | Wt 177.6 lb

## 2021-08-26 DIAGNOSIS — I10 Essential (primary) hypertension: Secondary | ICD-10-CM | POA: Diagnosis not present

## 2021-08-26 DIAGNOSIS — Z87891 Personal history of nicotine dependence: Secondary | ICD-10-CM | POA: Diagnosis not present

## 2021-08-26 DIAGNOSIS — I4819 Other persistent atrial fibrillation: Secondary | ICD-10-CM | POA: Diagnosis present

## 2021-08-26 DIAGNOSIS — R9431 Abnormal electrocardiogram [ECG] [EKG]: Secondary | ICD-10-CM | POA: Insufficient documentation

## 2021-08-26 DIAGNOSIS — Z8701 Personal history of pneumonia (recurrent): Secondary | ICD-10-CM | POA: Insufficient documentation

## 2021-08-26 DIAGNOSIS — Z79899 Other long term (current) drug therapy: Secondary | ICD-10-CM | POA: Diagnosis not present

## 2021-08-26 DIAGNOSIS — Z7901 Long term (current) use of anticoagulants: Secondary | ICD-10-CM | POA: Diagnosis not present

## 2021-08-26 DIAGNOSIS — D6869 Other thrombophilia: Secondary | ICD-10-CM | POA: Insufficient documentation

## 2021-08-26 MED ORDER — LABETALOL HCL 100 MG PO TABS: 100.0000 mg | ORAL_TABLET | Freq: Three times a day (TID) | ORAL | 3 refills | Status: DC

## 2021-08-26 NOTE — Progress Notes (Signed)
? ? ?Primary Care Physician: Pcp, No ?Primary Cardiologist: Dr Oval Linsey ?Primary Electrophysiologist: none ?Referring Physician: [facility] ? ? ?Scott [name] is a 71 y.o. male with a history of HTN, aortic dissection s/p repair, tobacco abuse, atrial fibrillation who presents for consultation in the [facility].  The patient was initially diagnosed with atrial fibrillation 08/11/21 after presenting to the ED with symptoms of fever, body aches, and SOB. He was rate controlled and sent home but returned to the ED the next day and was admitted for treatment of pneumonia. Patient was started on carvedilol for rate control and Eliquis for a CHADS2VASC score of 2. He reports that he still feels fatigued and SOB but is slowly improving. He remains in afib with elevated rates. He stopped amlodipine and carvedilol due to intolerable side effects and resumed labetalol. No bleeding issues on anticoagulation.  ? ?Today, he denies symptoms of palpitations, chest pain, orthopnea, PND, lower extremity edema, dizziness, presyncope, syncope, snoring, daytime somnolence, bleeding, or neurologic sequela. The patient is tolerating medications without difficulties and is otherwise without complaint today.  ? ? ?Atrial Fibrillation Risk Factors: ? ?he does not have symptoms or diagnosis of sleep apnea. ?he does not have a history of rheumatic fever. ?he does have a history of alcohol use. ? ? ?he has a BMI of Body mass index is 27 kg/m?Marland KitchenMarland Kitchen ?Filed Weights  ? 08/26/21 1507  ?Weight: 80.6 kg  ? ? ?Family History  ?Problem Relation Age of Onset  ? Lung cancer Sister   ? ? ? ?Atrial Fibrillation Management history: ? ?Previous antiarrhythmic drugs: none ?Previous cardioversions: none ?Previous ablations: none ?CHADS2VASC score: 2 ?Anticoagulation history: Eliquis ? ? ?Past Medical History:  ?Diagnosis Date  ? Dissection of aorta, abdominal ([place]) 02/02/2020  ? Essential hypertension 02/14/2020  ? Hypertension   ?  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 ?Past Surgical History:  ?Procedure Laterality Date  ? ABDOMINAL AORTIC ENDOVASCULAR STENT GRAFT  02/02/2020  ?  type b  ? APPENDECTOMY    ? THORACIC AORTIC ENDOVASCULAR STENT GRAFT N/A 02/02/2020  ? Procedure: REPAIR TYPE B THORACIC AORTIC ENDOVASCULAR STENT;  Surgeon: [name], MD;  Location: [facility];  Service: Vascular;  Laterality: N/A;  ? ? ?Current Outpatient Medications  ?Medication Sig Dispense Refill  ? acetaminophen (TYLENOL) 325 MG tablet Take 975 mg by mouth every 6 (six) hours as needed for mild pain.    ? apixaban (ELIQUIS) 5 MG TABS tablet Take 1 tablet (5 mg total) by mouth 2 (two) times daily. 60 tablet 0  ? Multiple Vitamin (MULTIVITAMIN) tablet Take 1 tablet by mouth every other day.    ? sodium chloride (OCEAN) 0.65 % SOLN nasal spray Place 1 spray into both nostrils 3 (three) times daily as needed for congestion.    ? VITAMIN D PO Take 1 tablet by mouth every other day.    ? labetalol (NORMODYNE) 100 MG tablet Take 1 tablet (100 mg total) by mouth 3 (three) times daily with meals. 90 tablet 3  ? ?No current facility-administered medications for this encounter.  ? ? ?Allergies  ?Allergen Reactions  ? Carvedilol Other (See Comments)  ?  Night terrors  ? Amlodipine Itching  ? Hydralazine   ?  Night terrors, fatigued  ? Labetalol Itching  ?  No redness - tingling/itching. Takes '100mg'$  twice daily    ? Valsartan   ?  Night terrors and fatigued  ? ? ?Social History  ? ?Socioeconomic  History  ? Marital status: Married  ?  Spouse name: Not on file  ? Number of children: 3  ? Years of education: Not on file  ? Highest education level: Not on file  ?Occupational History  ? Occupation: veteran  ?Tobacco Use  ? Smoking status: Former  ? Smokeless tobacco: Never  ? Tobacco comments:  ?  Former smoker 08/26/21  ?Vaping Use  ? Vaping Use: Never used  ?Substance and Sexual Activity  ? Alcohol use: Yes  ?  Alcohol/week: 1.0 - 2.0 standard drink  ?  Types: 1 - 2 Cans  of beer per week  ?  Comment: 1-2 beers 1-2 times a week 08/26/21  ? Drug use: No  ? Sexual activity: Not on file  ?Other Topics Concern  ? Not on file  ?Social History Narrative  ? Not on file  ? ?Social Determinants of Health  ? ?Financial Resource Strain: Not on file  ?Food Insecurity: Not on file  ?Transportation Needs: Not on file  ?Physical Activity: Not on file  ?Stress: Not on file  ?Social Connections: Not on file  ?Intimate Partner Violence: Not on file  ? ? ? ?ROS- All systems are reviewed and negative except as per the HPI above. ? ?Physical Exam: ?Vitals:  ? 08/26/21 1507  ?BP: (!) 170/100  ?Pulse: (!) 112  ?Weight: 80.6 kg  ?Height: '5\' 8"'$  (1.727 m)  ? ? ?GEN- The patient is a well appearing male, alert and oriented x 3 today.   ?Head- normocephalic, atraumatic ?Eyes-  Sclera clear, conjunctiva pink ?Ears- hearing intact ?Oropharynx- clear ?Neck- supple  ?Lungs- Clear to ausculation bilaterally, normal work of breathing ?Heart- irregular rate and rhythm, no murmurs, rubs or gallops  ?GI- soft, NT, ND, + BS ?Extremities- no clubbing, cyanosis, or edema ?MS- no significant deformity or atrophy ?Skin- no rash or lesion ?Psych- euthymic mood, full affect ?Neuro- strength and sensation are intact ? ?Wt Readings from Last 3 Encounters:  ?08/26/21 80.6 kg  ?08/12/21 79.4 kg  ?08/11/21 79.1 kg  ? ? ?EKG today demonstrates  ?Afib with RVR ?Vent. rate 112 BPM ?PR interval * ms ?QRS duration 94 ms ?QT/QTcB 302/412 ms ? ?Echo 08/13/21 demonstrated  ?1. Left ventricular ejection fraction, by estimation, is 55 to 60%. The  ?left ventricle has normal function. The left ventricle has no regional  ?wall motion abnormalities. The left ventricular internal cavity size was  ?mildly dilated. There is mild left ventricular hypertrophy. Left ventricular diastolic function could not be evaluated. Elevated left atrial pressure.  ? 2. Right ventricular systolic function is normal. The right ventricular  ?size is normal. There is  normal pulmonary artery systolic pressure.  ? 3. Left atrial size was mildly dilated.  ? 4. Right atrial size was severely dilated.  ? 5. The mitral valve is normal in structure. Trivial mitral valve  ?regurgitation. No evidence of mitral stenosis.  ? 6. The aortic valve is tricuspid. Aortic valve regurgitation is mild. No  ?aortic stenosis is present.  ? 7. Aortic dilatation noted. There is mild dilatation of the aortic root,  ?measuring 40 mm.  ? 8. The inferior vena cava is normal in size with greater than 50%  ?respiratory variability, suggesting right atrial pressure of 3 mmHg. ? ?Epic records are reviewed at length today ? ?CHA2DS2-VASc Score = 2  ?The patient's score is based upon: ?CHF History: 0 ?HTN History: 1 ?Diabetes History: 0 ?Stroke History: 0 ?Vascular Disease History: 0 ?Age Score: 1 ?Gender  Score: 0 ?    ? ? ?ASSESSMENT AND PLAN: ?1. Persistent Atrial Fibrillation (ICD10:  I48.19) ?The patient's CHA2DS2-VASc score is 2, indicating a 2.2% annual risk of stroke.   ?General education about afib provided and questions answered. We also discussed his stroke risk and the risks and benefits of anticoagulation. Suspect episodes related to recent infection.  ?We discussed rhythm control options including DCCV. He declines for now, he would like to do more reading about the procedure. Patient education material provided.  ?Will increase labetalol to 100 mg TID ?Continue Eliquis 5 mg BID ? ?2. Secondary Hypercoagulable State (ICD10:  D68.69) ?The patient is at significant risk for stroke/thromboembolism based upon his CHA2DS2-VASc Score of 2.  Continue Apixaban (Eliquis).  ? ?3. HTN ?Elevated today ?Patient has several medication intolerances. Stressed importance of good BP control given his history of dissection.  ?Will increase labetalol as above. ?Will refer him back to HTN clinic.  ? ? ?Follow up in the AF clinic in 2 weeks.  ? ? ?Ricky Roderick Sweezy PA-C ?Afib Clinic ?[facility] ?[address] ?[place], [place] [address] ?[phone] ?08/26/2021 ?4:16 PM ? ?

## 2021-08-26 NOTE — Patient Instructions (Addendum)
Increase labetalol to '100mg'$  three times a day ?

## 2021-08-27 ENCOUNTER — Encounter: Payer: Self-pay | Admitting: Family

## 2021-08-27 ENCOUNTER — Ambulatory Visit (INDEPENDENT_AMBULATORY_CARE_PROVIDER_SITE_OTHER): Payer: Medicare PPO | Admitting: Family

## 2021-08-27 VITALS — BP 146/90 | HR 67 | Temp 97.3°F | Ht 67.0 in | Wt 178.0 lb

## 2021-08-27 DIAGNOSIS — I10 Essential (primary) hypertension: Secondary | ICD-10-CM | POA: Diagnosis not present

## 2021-08-27 DIAGNOSIS — E663 Overweight: Secondary | ICD-10-CM

## 2021-08-27 DIAGNOSIS — I4819 Other persistent atrial fibrillation: Secondary | ICD-10-CM

## 2021-08-27 DIAGNOSIS — Z1159 Encounter for screening for other viral diseases: Secondary | ICD-10-CM

## 2021-08-27 DIAGNOSIS — Z1211 Encounter for screening for malignant neoplasm of colon: Secondary | ICD-10-CM

## 2021-08-27 DIAGNOSIS — Z7689 Persons encountering health services in other specified circumstances: Secondary | ICD-10-CM | POA: Diagnosis not present

## 2021-08-27 DIAGNOSIS — I7102 Dissection of abdominal aorta: Secondary | ICD-10-CM | POA: Diagnosis not present

## 2021-08-27 DIAGNOSIS — Z6827 Body mass index (BMI) 27.0-27.9, adult: Secondary | ICD-10-CM

## 2021-08-27 DIAGNOSIS — R5382 Chronic fatigue, unspecified: Secondary | ICD-10-CM

## 2021-08-27 NOTE — Progress Notes (Signed)
Provider: Marlowe Sax FNP-C   [name], [name], NP  Patient Care Team: [name], [name], NP as PCP - General (Family Medicine) [name], MD (Inactive) as Consulting Physician (Vascular Surgery) [name], MD as Attending Physician (Cardiology) Clinic, [facility]  Extended Emergency Contact Information Primary Emergency Contact: [name], [place] [address] [name] Phone: [phone] Mobile Phone: [phone] Relation: Daughter Secondary Emergency Contact: mourer,cindy Address: [address]          [place], [place] [address] [place] Phone: [phone] Relation: Daughter Preferred language: English Interpreter needed? No  Code Status: Full code Goals of care: Advanced Directive information Advanced Directives 08/27/2021  Does Patient Have a Medical Advance Directive? No  Type of Advance Directive -  Does patient want to make changes to medical advance directive? No - Patient declined  Would patient like information on creating a medical advance directive? -     Chief Complaint  Patient presents with   Establish Care    New Patient to establish care. Medication bottles not present at initial appointment. Patient concerned about afib, seen at afib clinic. Here with daughter [name].     HPI:  Pt is a 71 y.o. male seen today establish care here at [facility] for medical management of chronic diseases. 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  He is here with his daughter who provides additional HPI information.  Hypertension -no home blood pressure reading for evaluation. Complains of ongoing fatigue and shortness of breath which he attributes it to long COVID symptoms though he was  recently diagnosed with arterial fibrillation.He denies any chest pain, headache,dizziness,vision changes,chest tightness or palpitation  Arterial fibrillation -he was first diagnosed August 11, 2021 after presenting to the ED with symptoms of shortness of breath he was started on carvedilol for heart rate control and Eliquis for anticoagulation.  He denies any signs of bleeding still feels fatigued and has shortness of breath but slowly improving.  He stopped amlodipine and carvedilol due to intolerable side effects and he resumed labetalol.  He was seen yesterday by A-fib clinic and his labetalol was increased.  Macular degeneration-follows up with ophthalmology at [facility].  Reports no vision impairment.      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 Takes 125m twice daily     Valsartan     Night terrors and fatigued    Allergies as of 08/27/2021       Reactions   Carvedilol Other (See Comments)  Night terrors   Amlodipine Itching   Hydralazine    Night terrors, fatigued   Labetalol Itching   No redness - tingling/itching. Takes 170m twice daily     Valsartan    Night terrors and fatigued        Medication List        Accurate as of August 27, 2021  1:45 PM. If you have any  questions, ask your nurse or doctor.          acetaminophen 325 MG tablet Commonly known as: TYLENOL Take 975 mg by mouth every 6 (six) hours as needed for mild pain.   Eliquis 5 MG Tabs tablet Generic drug: apixaban Take 1 tablet (5 mg total) by mouth 2 (two) times daily.   labetalol 100 MG tablet Commonly known as: NORMODYNE Take 1 tablet (100 mg total) by mouth 3 (three) times daily with meals.   multivitamin tablet Take 1 tablet by mouth every other day.   sodium chloride 0.65 % Soln nasal spray Commonly known as: OCEAN Place 1 spray into both nostrils 3 (three) times daily as needed for congestion.   VITAMIN D PO Take 1 tablet by mouth every other day.        Review of Systems  Constitutional:  Negative for appetite change, chills, fatigue, fever and unexpected weight change.  HENT:  Negative for congestion, dental problem, ear discharge, ear pain, facial swelling, hearing loss, nosebleeds, postnasal drip, rhinorrhea, sinus pressure, sinus pain, sneezing, sore throat, tinnitus and trouble swallowing.   Eyes:  Positive for visual disturbance. Negative for pain, discharge, redness and itching.       Wears eye glasses  Macular degeneration on left [facility]   Respiratory:  Negative for cough, chest tightness, shortness of breath and wheezing.   Cardiovascular:  Negative for chest pain, palpitations and leg swelling.  Gastrointestinal:  Negative for abdominal distention, abdominal pain, blood in stool, constipation, diarrhea, nausea and vomiting.  Endocrine: Negative for cold intolerance, heat intolerance, polydipsia, polyphagia and polyuria.  Genitourinary:  Negative for difficulty urinating, dysuria, flank pain, frequency and urgency.  Musculoskeletal:  Negative for arthralgias, back pain, gait problem, joint swelling, myalgias, neck pain and neck stiffness.  Skin:  Negative for color change, pallor, rash and wound.  Neurological:  Negative for  dizziness, syncope, speech difficulty, weakness, light-headedness, numbness and headaches.  Hematological:  Does not bruise/bleed easily.  Psychiatric/Behavioral:  Negative for agitation, behavioral problems, confusion, hallucinations, self-injury, sleep disturbance and suicidal ideas. The patient is not nervous/anxious.    Immunization History  Administered Date(s) Administered   Tdap 11/16/2014   Pertinent  Health Maintenance Due  Topic Date Due   COLONOSCOPY (Pts [id number] coverage will need to be confirmed)  Never done   INFLUENZA VACCINE  Never done   Fall Risk 08/11/2021 08/12/2021 08/12/2021 08/13/2021 08/27/2021  Falls in the past year? - - - - 0  Was there an injury with Fall? - - - - 0  Fall Risk Category Calculator - - - - 0  Fall Risk Category - - - - Low  Patient Fall Risk Level _0   Patient at Risk for Falls Due to - - - - No Fall Risks  Fall risk Follow up - - - - Falls evaluation completed   Functional Status Survey:    Vitals:   08/27/21 1333  BP: (!) 146/90  Pulse: 67  Temp: (!) 97.3 F (36.3 C)  TempSrc: Temporal  SpO2: 99%  Weight: 178 lb (80.7 kg)  Height: _0  (1.702 m)   Body mass index is 27.88 kg/m. Physical Exam Vitals reviewed.  Constitutional:      General: He is not in acute distress.    Appearance: Normal appearance. He is normal weight. He is not ill-appearing or diaphoretic.  HENT:     Head: Normocephalic.     Right Ear: Tympanic membrane, ear canal and external ear normal. There is no impacted cerumen.     Left Ear: Tympanic membrane, ear canal and external ear normal. There is no impacted cerumen.     Nose: Nose normal. No congestion or rhinorrhea.     Mouth/Throat:     Mouth: Mucous membranes are moist.     Pharynx: Oropharynx is clear. No oropharyngeal exudate or posterior oropharyngeal erythema.  Eyes:     General: No scleral icterus.       Right eye: No  discharge.        Left eye: No discharge.     Conjunctiva/sclera: Conjunctivae normal.     Pupils: Pupils are equal, round, and reactive to light.     Comments: Corrective lens in place  Neck:     Vascular: No carotid bruit.  Cardiovascular:     Rate and Rhythm: Normal rate. Rhythm irregular.     Pulses: Normal pulses.     Heart sounds: Normal heart sounds. No murmur heard.   No friction rub. No gallop.  Pulmonary:     Effort: Pulmonary effort is normal. No respiratory distress.     Breath sounds: Normal breath sounds. No wheezing, rhonchi or rales.  Chest:     Chest wall: No tenderness.  Abdominal:     General: Bowel sounds are normal. There is no distension.     Palpations: Abdomen is soft. There is no mass.     Tenderness: There is no abdominal tenderness. There is no right CVA tenderness, left CVA tenderness, guarding or rebound.  Musculoskeletal:        General: No swelling or tenderness. Normal range of motion.     Cervical back: Normal range of motion. No rigidity or tenderness.     Right lower leg: No edema.     Left lower leg: No edema.  Lymphadenopathy:     Cervical: No cervical adenopathy.  Skin:    General: Skin is warm and dry.     Coloration: Skin is not pale.     Findings: No bruising, erythema, lesion or rash.  Neurological:     Mental Status: He is alert and oriented to person, place, and time.     Cranial Nerves: No cranial nerve deficit.     Sensory: No sensory deficit.     Motor: No weakness.     Coordination: Coordination normal.     Gait: Gait normal.  Psychiatric:        Mood and Affect: Mood normal.        Speech: Speech normal.        Behavior: Behavior normal.        Thought Content: Thought content normal.        Judgment: Judgment normal.    Labs reviewed: Recent Labs    08/11/21 1628 08/12/21 1417 08/12/21 2339 08/13/21 0420  NA 131* 133*  --  132*  K 3.3* 3.1*  --  3.5  CL 100 100  --  103  CO2 22 21*  --  19*  GLUCOSE 126* 140*   --  107*  BUN 29* 25*  --  19  CREATININE 1.19 1.29*  --  1.06  CALCIUM 8.6* 8.7*  --  8.0*  MG 2.1  --  2.1 2.0   Recent Labs    08/11/21 1628 08/12/21 1417 08/13/21 0420  AST 33 50* 46*  ALT 24 37 35  ALKPHOS 66 68 57  BILITOT 0.9 0.9 0.6  PROT 6.7 6.6 5.5*  ALBUMIN 3.5 3.4* 2.7*   Recent Labs    08/11/21 1628 08/12/21 1417 08/13/21 0420  WBC 8.9 8.1 5.8  NEUTROABS 7.7 6.6  --   HGB 14.8 15.7 13.4  HCT 42.6 43.5 37.3*  MCV 97.3 96.9 96.6  PLT 170 179 160   No results found for: TSH Lab Results  Component Value Date   HGBA1C 5.6 01/30/2020   Lab Results  Component Value Date   TRIG 66 01/30/2020    Significant Diagnostic Results in last 30 days:  DG Chest 2 View  Result Date: 08/12/2021 CLINICAL DATA:  Shortness of breath for 1 week. EXAM: CHEST - 2 VIEW COMPARISON:  Chest radiograph 08/11/2021. FINDINGS: Stable cardiac and mediastinal contours with stent material in the thoracic aorta. Increased consolidation within the right lower lung. No pleural effusion or pneumothorax. Thoracic spine degenerative changes. IMPRESSION: Increasing right lower lung consolidation which may represent pneumonia in the appropriate clinical setting. Followup PA and lateral chest X-ray is recommended in 3-4 weeks following trial of antibiotic therapy to ensure resolution and exclude underlying malignancy. Electronically Signed   By: Lovey Newcomer M.D.   On: 08/12/2021 14:58   DG Chest Port 1 View  Result Date: 08/11/2021 CLINICAL DATA:  Short of breath, fever EXAM: PORTABLE CHEST 1 VIEW COMPARISON:  Prior chest x-ray 02/02/2020 FINDINGS: Stable postsurgical changes of prior TEVAR. Cardiomegaly and mediastinal contours remain unchanged. Right perihilar atelectasis versus scarring appears similar compared to prior images. No focal airspace infiltrate, pulmonary edema, pleural effusion or pneumothorax. IMPRESSION: No active disease. Stable cardiomegaly and right perihilar atelectasis versus  scarring. Surgical changes of prior TEVAR. Electronically Signed   By: Jacqulynn Cadet M.D.   On: 08/11/2021 16:42   ECHOCARDIOGRAM COMPLETE  Result Date: 08/13/2021    ECHOCARDIOGRAM REPORT   Patient Name:   Scott [name] Date of Exam: 08/13/2021 Medical Rec #:  [id number]  Height:       68.0 in Accession #:    [id number] Weight:       175.0 lb Date of Birth:  Nov 12, 1950  BSA:          1.931 m Patient Age:    22 years   BP:           128/93 mmHg Patient Gender: M          HR:           99 bpm. Exam Location:  Inpatient Procedure: 2D Echo Indications:    Atrial fibrillation  History:        Patient has prior history of Echocardiogram examinations, most                 recent 01/29/2020. Risk Factors:Hypertension. History of aortic                 dissection and repair.  Sonographer:    Arlyss Gandy Referring Phys: [place]  1. Left ventricular ejection fraction, by estimation, is 55 to 60%. The left ventricle has normal function. The  left ventricle has no regional wall motion abnormalities. The left ventricular internal cavity size was mildly dilated. There is mild left ventricular hypertrophy. Left ventricular diastolic function could not be evaluated. Elevated left atrial pressure.  2. Right ventricular systolic function is normal. The right ventricular size is normal. There is normal pulmonary artery systolic pressure.  3. Left atrial size was mildly dilated.  4. Right atrial size was severely dilated.  5. The mitral valve is normal in structure. Trivial mitral valve regurgitation. No evidence of mitral stenosis.  6. The aortic valve is tricuspid. Aortic valve regurgitation is mild. No aortic stenosis is present.  7. Aortic dilatation noted. There is mild dilatation of the aortic root, measuring 40 mm.  8. The inferior vena cava is normal in size with greater than 50% respiratory variability, suggesting right atrial pressure of 3 mmHg. FINDINGS  Left Ventricle: Left ventricular ejection  fraction, by estimation, is 55 to 60%. The left ventricle has normal function. The left ventricle has no regional wall motion abnormalities. The left ventricular internal cavity size was mildly dilated. There is  mild left ventricular hypertrophy. Left ventricular diastolic function could not be evaluated due to atrial fibrillation. Left ventricular diastolic function could not be evaluated. Elevated left atrial pressure. Right Ventricle: The right ventricular size is normal. Right ventricular systolic function is normal. There is normal pulmonary artery systolic pressure. The tricuspid regurgitant velocity is 2.42 m/s, and with an assumed right atrial pressure of 3 mmHg,  the estimated right ventricular systolic pressure is 03.1 mmHg. Left Atrium: Left atrial size was mildly dilated. Right Atrium: Right atrial size was severely dilated. Pericardium: There is no evidence of pericardial effusion. Mitral Valve: The mitral valve is normal in structure. Mild mitral annular calcification. Trivial mitral valve regurgitation. No evidence of mitral valve stenosis. Tricuspid Valve: The tricuspid valve is normal in structure. Tricuspid valve regurgitation is mild . No evidence of tricuspid stenosis. Aortic Valve: The aortic valve is tricuspid. Aortic valve regurgitation is mild. Aortic regurgitation PHT measures 393 msec. No aortic stenosis is present. Aortic valve mean gradient measures 3.5 mmHg. Aortic valve peak gradient measures 6.4 mmHg. Aortic  valve area, by VTI measures 2.61 cm. Pulmonic Valve: The pulmonic valve was normal in structure. Pulmonic valve regurgitation is trivial. No evidence of pulmonic stenosis. Aorta: Aortic dilatation noted. There is mild dilatation of the aortic root, measuring 40 mm. Venous: The inferior vena cava is normal in size with greater than 50% respiratory variability, suggesting right atrial pressure of 3 mmHg. IAS/Shunts: No atrial level shunt detected by color flow Doppler.  LEFT  VENTRICLE PLAX 2D LVIDd:         5.40 cm   Diastology LVIDs:         4.60 cm   LV e' medial:    7.62 cm/s LV PW:         1.20 cm   LV E/e' medial:  18.5 LV IVS:        1.20 cm   LV e' lateral:   8.92 cm/s LVOT diam:     2.10 cm   LV E/e' lateral: 15.8 LV SV:         59 LV SV Index:   31 LVOT Area:     3.46 cm  RIGHT VENTRICLE            IVC RV Basal diam:  4.00 cm    IVC diam: 2.00 cm RV Mid diam:    3.00 cm RV S  prime:     8.92 cm/s TAPSE (M-mode): 1.9 cm LEFT ATRIUM              Index        RIGHT ATRIUM           Index LA diam:        4.50 cm  2.33 cm/m   RA Area:     22.00 cm LA Vol (A2C):   136.0 ml 70.43 ml/m  RA Volume:   63.30 ml  32.78 ml/m LA Vol (A4C):   98.0 ml  50.75 ml/m LA Biplane Vol: 117.0 ml 60.59 ml/m  AORTIC VALVE AV Area (Vmax):    2.77 cm AV Area (Vmean):   2.51 cm AV Area (VTI):     2.61 cm AV Vmax:           126.00 cm/s AV Vmean:          89.600 cm/s AV VTI:            0.226 m AV Peak Grad:      6.4 mmHg AV Mean Grad:      3.5 mmHg LVOT Vmax:         100.65 cm/s LVOT Vmean:        64.950 cm/s LVOT VTI:          0.171 m LVOT/AV VTI ratio: 0.75 AI PHT:            393 msec  AORTA Ao Root diam: 4.00 cm Ao Asc diam:  3.60 cm MITRAL VALVE                TRICUSPID VALVE MV Area (PHT): 4.63 cm     TR Peak grad:   23.4 mmHg MV Decel Time: 164 msec     TR Vmax:        242.00 cm/s MV E velocity: 141.00 cm/s                             SHUNTS                             Systemic VTI:  0.17 m                             Systemic Diam: 2.10 cm Kirk Ruths MD Electronically signed by Kirk Ruths MD Signature Date/Time: 08/13/2021/2:09:26 PM    Final     Assessment/Plan 1. Encounter to establish care Available records on epic reviewed, he is due for influenza, vaccine shingles, vaccine COVID-19 vaccine but he declines all of them.  Also declines pneumococcal vaccine states recently had pneumonia will wait for this and then try again next visit.  Due for screening colonoscopy but states he  has had 2 people who have died due to colonoscopy he prefers to have Cologuard and will order this today.  Recommended fasting blood work to be scheduled in 1 week.  2. Essential hypertension Blood pressure not at goal medication adjusted yesterday at the A-fib clinic. Advised to continue to monitor blood pressure at home and notify provider if greater than 140/90 Continue on labetalol 100 mg 3 times daily with meals Not on statin Will order lipid panel then start on statin if needed. Dietary modification.Exercise limited at this time due to irregular heartbeat - CBC with Differential/Platelet; Future - CMP with eGFR([facility]); Future -  TSH; Future - Lipid panel; Future  3. Dissection of abdominal aorta ([place]) S/p repair in 2021 reports no chest pain. We will continue to control high risk factors  4. Persistent atrial fibrillation ([name]) Newly diagnosed - Continue on apixaban for anticoagulation - Continue on labetalol for heart rate control -Continue to follow-up with A-fib clinic Shortness of breath persist with exertion will refer to cardiologist for further evaluation - CBC with Differential/Platelet; Future - CMP with eGFR([facility]); Future - Ambulatory referral to Cardiology  5. Overweight (BMI 25.0-29.9) BMI 27.88 Dietary modification and exercise as tolerated  6. BMI 27.0-27.9,adult Dietary modification as above  7. Colon cancer screening Declines referral to GI for screening colonoscopy states has had 2 people of died due to colonoscopy. Prefers Cologuard - Cologuard  8. Encounter for hepatitis C screening test for low risk patient Low risk - Hep C Antibody; Future - Ambulatory referral to Cardiology  9. Chronic fatigue Suspect multifactorial possible due to x2 episodes of COVID and ongoing recent new diagnosis of A-fib. We will continue to monitor  Family/ staff Communication: Reviewed plan of care with patient and daughter verbalized understanding  Labs/tests  ordered:  - CBC with Differential/Platelet - CMP with eGFR([facility]) - TSH - Lipid panel - Hep C Antibody   Next Appointment : 6 months for medical management of chronic issues.  Fasting blood work in 1 week or sooner  Sandrea Hughs, NP

## 2021-09-02 ENCOUNTER — Other Ambulatory Visit: Payer: Medicare PPO

## 2021-09-02 DIAGNOSIS — Z1159 Encounter for screening for other viral diseases: Secondary | ICD-10-CM

## 2021-09-02 DIAGNOSIS — I4819 Other persistent atrial fibrillation: Secondary | ICD-10-CM

## 2021-09-02 DIAGNOSIS — I10 Essential (primary) hypertension: Secondary | ICD-10-CM

## 2021-09-09 ENCOUNTER — Ambulatory Visit (HOSPITAL_COMMUNITY)
Admission: RE | Admit: 2021-09-09 | Discharge: 2021-09-09 | Disposition: A | Discharge status: Home / Self Care | Payer: Medicare PPO | Source: Ambulatory Visit | Attending: Physician Assistant | Admitting: Physician Assistant

## 2021-09-09 ENCOUNTER — Other Ambulatory Visit: Payer: Self-pay

## 2021-09-09 ENCOUNTER — Encounter (HOSPITAL_COMMUNITY): Payer: Self-pay | Admitting: Radiology

## 2021-09-09 VITALS — BP 180/86 | HR 89 | Ht 67.0 in | Wt 178.8 lb

## 2021-09-09 DIAGNOSIS — I10 Essential (primary) hypertension: Secondary | ICD-10-CM | POA: Insufficient documentation

## 2021-09-09 DIAGNOSIS — D6869 Other thrombophilia: Secondary | ICD-10-CM | POA: Diagnosis not present

## 2021-09-09 DIAGNOSIS — Z7901 Long term (current) use of anticoagulants: Secondary | ICD-10-CM | POA: Diagnosis not present

## 2021-09-09 DIAGNOSIS — I4819 Other persistent atrial fibrillation: Secondary | ICD-10-CM | POA: Diagnosis present

## 2021-09-09 DIAGNOSIS — Z95828 Presence of other vascular implants and grafts: Secondary | ICD-10-CM | POA: Insufficient documentation

## 2021-09-09 DIAGNOSIS — Z79899 Other long term (current) drug therapy: Secondary | ICD-10-CM | POA: Diagnosis not present

## 2021-09-09 DIAGNOSIS — Z87891 Personal history of nicotine dependence: Secondary | ICD-10-CM | POA: Diagnosis not present

## 2021-09-09 MED ORDER — APIXABAN 5 MG PO TABS: 5.0000 mg | ORAL_TABLET | Freq: Two times a day (BID) | ORAL | 3 refills | Status: DC

## 2021-09-09 NOTE — Progress Notes (Signed)
? ? ?Primary Care Physician: Sandrea Hughs, NP ?Primary Cardiologist: Dr Oval Linsey ?Primary Electrophysiologist: none ?Referring Physician: [facility] ? ? ?Scott [name] is a 71 y.o. male with a history of HTN, aortic dissection s/p repair, tobacco abuse, atrial fibrillation who presents for follow up in the [facility].  The patient was initially diagnosed with atrial fibrillation 08/11/21 after presenting to the ED with symptoms of fever, body aches, and SOB. He was rate controlled and sent home but returned to the ED the next day and was admitted for treatment of pneumonia. Patient was started on carvedilol for rate control and Eliquis for a CHADS2VASC score of 2. He stopped amlodipine and carvedilol due to intolerable side effects and resumed labetalol. ? ?On follow up today, patient reports that he feels "great" today. He has no awareness of his afib. He states he does have some dyspnea with exertion (yard work) but this has been chronic for years and is no worse now. He denies any bleeding issues on anticoagulation. He checks his BP several times per day at home and readings have been 120s-140s over 60s-70s.  ? ?Today, he denies symptoms of palpitations, chest pain, orthopnea, PND, lower extremity edema, dizziness, presyncope, syncope, snoring, daytime somnolence, bleeding, or neurologic sequela. The patient is tolerating medications without difficulties and is otherwise without complaint today.  ? ? ?Atrial Fibrillation Risk Factors: ? ?he does not have symptoms or diagnosis of sleep apnea. ?he does not have a history of rheumatic fever. ?he does have a history of alcohol use. ? ? ?he has a BMI of Body mass index is 28 kg/m?Marland KitchenMarland Kitchen ?Filed Weights  ? 09/09/21 1528  ?Weight: 81.1 kg  ? ? ?Family History  ?Problem Relation Age of Onset  ? Lung cancer Sister   ? ? ? ?Atrial Fibrillation Management history: ? ?Previous antiarrhythmic drugs: none ?Previous cardioversions: none ?Previous  ablations: none ?CHADS2VASC score: 2 ?Anticoagulation history: Eliquis ? ? ?Past Medical History:  ?Diagnosis Date  ? Dissection of aorta, abdominal ([place]) 02/02/2020  ? Essential hypertension 02/14/2020  ? Hypertension   ? 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 ?Past Surgical History:  ?Procedure Laterality Date  ? ABDOMINAL AORTIC ENDOVASCULAR STENT GRAFT  02/02/2020  ?  type b  ? APPENDECTOMY    ? THORACIC AORTIC ENDOVASCULAR STENT GRAFT N/A 02/02/2020  ? Procedure: REPAIR TYPE B THORACIC AORTIC ENDOVASCULAR STENT;  Surgeon: [name], MD;  Location: [facility];  Service: Vascular;  Laterality: N/A;  ? ? ?Current Outpatient Medications  ?Medication Sig Dispense Refill  ? acetaminophen (TYLENOL) 325 MG tablet Take 975 mg by mouth as needed for mild pain.    ? labetalol (NORMODYNE) 100 MG tablet Take 1 tablet (100 mg total) by mouth 3 (three) times daily with meals. 90 tablet 3  ? Magnesium 500 MG CAPS Take 1 capsule by mouth every morning.    ? Multiple Vitamin (MULTIVITAMIN) tablet Take 1 tablet by mouth every other day.    ? sodium chloride (OCEAN) 0.65 % SOLN nasal spray Place 1 spray into both nostrils 3 (three) times daily as needed for congestion.    ? VITAMIN D PO Take 1 tablet by mouth every other day.    ? apixaban (ELIQUIS) 5 MG TABS tablet Take 1 tablet (5 mg total) by mouth 2 (two) times daily. 180 tablet 3  ? ?No current facility-administered medications for this encounter.  ? ? ?Allergies  ?Allergen Reactions  ? Carvedilol Other (See Comments)  ?  Night terrors  ? Amlodipine Itching  ? Hydralazine   ?  Night terrors, fatigued  ? Labetalol Itching  ?  No redness - tingling/itching. Takes '100mg'$  twice daily    ? Valsartan   ?  Night terrors and fatigued  ? ? ?Social History  ? ?Socioeconomic History  ? Marital status: Married  ?  Spouse name: Not on file  ? Number of children: 3  ? Years of education: Not on file  ? Highest education level: Not on file  ?Occupational History  ? Occupation: veteran   ?Tobacco Use  ? Smoking status: Former  ? Smokeless tobacco: Never  ? Tobacco comments:  ?  Quit 2021  ?Vaping Use  ? Vaping Use: Never used  ?Substance and Sexual Activity  ? Alcohol use: Yes  ?  Alcohol/week: 1.0 - 2.0 standard drink  ?  Types: 1 - 2 Cans of beer per week  ?  Comment: 1-2 beers 1-2 times a week 08/26/21  ? Drug use: Yes  ?  Types: Marijuana  ? Sexual activity: Not on file  ?Other Topics Concern  ? Not on file  ?Social History Narrative  ? Not on file  ? ?Social Determinants of Health  ? ?Financial Resource Strain: Not on file  ?Food Insecurity: Not on file  ?Transportation Needs: Not on file  ?Physical Activity: Not on file  ?Stress: Not on file  ?Social Connections: Not on file  ?Intimate Partner Violence: Not on file  ? ? ? ?ROS- All systems are reviewed and negative except as per the HPI above. ? ?Physical Exam: ?Vitals:  ? 09/09/21 1528  ?BP: (!) 180/86  ?Pulse: 89  ?Weight: 81.1 kg  ?Height: '5\' 7"'$  (1.702 m)  ? ? ?GEN- The patient is a well appearing male, alert and oriented x 3 today.   ?HEENT-head normocephalic, atraumatic, sclera clear, conjunctiva pink, hearing intact, trachea midline. ?Lungs- Clear to ausculation bilaterally, normal work of breathing ?Heart- irregular rate and rhythm, no murmurs, rubs or gallops  ?GI- soft, NT, ND, + BS ?Extremities- no clubbing, cyanosis, or edema ?MS- no significant deformity or atrophy ?Skin- no rash or lesion ?Psych- euthymic mood, full affect ?Neuro- strength and sensation are intact ? ? ?Wt Readings from Last 3 Encounters:  ?09/09/21 81.1 kg  ?08/27/21 80.7 kg  ?08/26/21 80.6 kg  ? ? ?EKG today demonstrates  ?Afib ?Vent. rate 89 BPM ?PR interval * ms ?QRS duration 96 ms ?QT/QTcB 356/433 ms ? ?Echo 08/13/21 demonstrated  ?1. Left ventricular ejection fraction, by estimation, is 55 to 60%. The  ?left ventricle has normal function. The left ventricle has no regional  ?wall motion abnormalities. The left ventricular internal cavity size was  ?mildly  dilated. There is mild left ventricular hypertrophy. Left ventricular diastolic function could not be evaluated. Elevated left atrial pressure.  ? 2. Right ventricular systolic function is normal. The right ventricular  ?size is normal. There is normal pulmonary artery systolic pressure.  ? 3. Left atrial size was mildly dilated.  ? 4. Right atrial size was severely dilated.  ? 5. The mitral valve is normal in structure. Trivial mitral valve  ?regurgitation. No evidence of mitral stenosis.  ? 6. The aortic valve is tricuspid. Aortic valve regurgitation is mild. No  ?aortic stenosis is present.  ? 7. Aortic dilatation noted. There is mild dilatation of the aortic root,  ?measuring 40 mm.  ? 8. The inferior vena cava is normal in size with greater than 50%  ?respiratory  variability, suggesting right atrial pressure of 3 mmHg. ? ?Epic records are reviewed at length today ? ?CHA2DS2-VASc Score = 2  ?The patient's score is based upon: ?CHF History: 0 ?HTN History: 1 ?Diabetes History: 0 ?Stroke History: 0 ?Vascular Disease History: 0 ?Age Score: 1 ?Gender Score: 0 ?    ? ? ?ASSESSMENT AND PLAN: ?1. Persistent Atrial Fibrillation (ICD10:  I48.19) ?The patient's CHA2DS2-VASc score is 2, indicating a 2.2% annual risk of stroke.   ?Patient remains in afib, heart rates much better controlled. We discussed rhythm vs rate control again today. He would like to pursue a conservative rate control strategy for now.  ?Continue labetalol 100 mg TID ?Continue Eliquis 5 mg BID ? ?2. Secondary Hypercoagulable State (ICD10:  D68.69) ?The patient is at significant risk for stroke/thromboembolism based upon his CHA2DS2-VASc Score of 2.  Continue Apixaban (Eliquis).  ? ?3. HTN ?BP elevated today, better on recheck. Has much better readings on his home machine.  ?Referred to HTN clinic, has appointment on 09/24/21. ? ? ?Follow up in the AF clinic in 3 months.  ? ? ?Ricky Martice Doty PA-C ?Afib Clinic ?[facility] ?[address] ?[place], [place] [address] ?[phone] ?09/09/2021 ?4:58 PM ? ?

## 2021-09-24 ENCOUNTER — Ambulatory Visit: Payer: No Typology Code available for payment source

## 2021-09-24 NOTE — Progress Notes (Deleted)
Patient ID: Scott [name]                 DOB: [birth year]                      MRN: [id number] ? ? ? ? ?HPI: ?Scott [name] is a 71 y.o. male referred by Adline Peals to HTN clinic. PMH is significant for HTN, smoking, and A Fib. He has a history of aortic aneurysm repair.  ? ?Current HTN meds:  ?Labetalol 100 mg TID ? ?Previously tried:  ?BP goal:  ? ?Family History:  ? ?Social History:  ? ?Diet:  ? ?Exercise:  ? ?Home BP readings:  ? ?Wt Readings from Last 3 Encounters:  ?09/09/21 178 lb 12.8 oz (81.1 kg)  ?08/27/21 178 lb (80.7 kg)  ?08/26/21 177 lb 9.6 oz (80.6 kg)  ? ?BP Readings from Last 3 Encounters:  ?09/09/21 (!) 180/86  ?08/27/21 (!) 146/90  ?08/26/21 (!) 170/100  ? ?Pulse Readings from Last 3 Encounters:  ?09/09/21 89  ?08/27/21 67  ?08/26/21 (!) 112  ? ? ?Renal function: ?CrCl cannot be calculated (Patient's most recent lab result is older than the maximum 21 days allowed.). ? ?Past Medical History:  ?Diagnosis Date  ? Dissection of aorta, abdominal ([place]) 02/02/2020  ? Essential hypertension 02/14/2020  ? Hypertension   ? 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 ? ?Current Outpatient Medications on File Prior to Visit  ?Medication Sig Dispense Refill  ? acetaminophen (TYLENOL) 325 MG tablet Take 975 mg by mouth as needed for mild pain.    ? apixaban (ELIQUIS) 5 MG TABS tablet Take 1 tablet (5 mg total) by mouth 2 (two) times daily. 180 tablet 3  ? labetalol (NORMODYNE) 100 MG tablet Take 1 tablet (100 mg total) by mouth 3 (three) times daily with meals. 90 tablet 3  ? Magnesium 500 MG CAPS Take 1 capsule by mouth every morning.    ? Multiple Vitamin (MULTIVITAMIN) tablet Take 1 tablet by mouth every other day.    ? sodium chloride (OCEAN) 0.65 % SOLN nasal spray Place 1 spray into both nostrils 3 (three) times daily as needed for congestion.    ? VITAMIN D PO Take 1 tablet by mouth every other day.    ? ?No current facility-administered medications on file prior to visit.  ? ? ?Allergies  ?Allergen Reactions  ?  Carvedilol Other (See Comments)  ?  Night terrors  ? Amlodipine Itching  ? Hydralazine   ?  Night terrors, fatigued  ? Labetalol Itching  ?  No redness - tingling/itching. Takes '100mg'$  twice daily    ? Valsartan   ?  Night terrors and fatigued  ? ? ? ?Assessment/Plan: ? ?1. Hypertension -   ?

## 2021-12-10 ENCOUNTER — Ambulatory Visit (HOSPITAL_COMMUNITY): Payer: No Typology Code available for payment source | Admitting: Physician Assistant

## 2021-12-17 ENCOUNTER — Ambulatory Visit (HOSPITAL_COMMUNITY): Payer: No Typology Code available for payment source | Admitting: Physician Assistant

## 2022-03-03 ENCOUNTER — Encounter: Payer: Medicare PPO | Admitting: Family

## 2022-03-03 DIAGNOSIS — Z23 Encounter for immunization: Secondary | ICD-10-CM

## 2022-03-03 NOTE — Progress Notes (Signed)
  This encounter was created in error - please disregard. No show 

## 2022-03-23 ENCOUNTER — Encounter: Payer: Self-pay | Admitting: General Surgery

## 2022-03-23 ENCOUNTER — Ambulatory Visit: Payer: Medicare Other | Admitting: General Surgery

## 2022-03-23 VITALS — BP 169/111 | HR 81 | Temp 98.0°F | Resp 16 | Ht 67.0 in | Wt 184.0 lb

## 2022-03-23 DIAGNOSIS — L723 Sebaceous cyst: Secondary | ICD-10-CM

## 2022-03-24 NOTE — H&P (Signed)
Scott [name]; [id number]; 1951/05/01   HPI Patient is a 71 year old white male who was referred to my care by [facility] for evaluation treatment of a subcutaneous mass along the posterior neck.  He has had a lipoma removed from his neck in the remote past.  The lump seems to be enlarging in size and is causing him some discomfort when he lies down and pressure was applied.  No drainage has been noted. 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    Past Surgical History:  Procedure Laterality Date   ABDOMINAL AORTIC ENDOVASCULAR STENT GRAFT  02/02/2020    type b   APPENDECTOMY     THORACIC AORTIC ENDOVASCULAR STENT GRAFT N/A 02/02/2020   Procedure: REPAIR TYPE B THORACIC AORTIC ENDOVASCULAR STENT;  Surgeon: [name], MD;  Location: [facility] OR;  Service: Vascular;  Laterality: N/A;    Family History  Problem Relation Age of Onset   Lung cancer Sister     Current Outpatient Medications on File Prior to Visit  Medication Sig Dispense Refill   acetaminophen (TYLENOL) 325 MG tablet Take 975 mg by mouth as needed for mild pain.     aspirin EC 81 MG tablet Take 1 tablet by mouth 2 (two) times daily.     labetalol (NORMODYNE) 100 MG tablet Take 1 tablet (100 mg total) by mouth 3 (three) times daily with meals. 90 tablet 3   Multiple Vitamin (MULTIVITAMIN) tablet Take 1 tablet by mouth every other day.     VITAMIN D PO Take 1 tablet by mouth every other day.     No current facility-administered medications on file prior to visit.    Allergies  Allergen Reactions   Carvedilol Other (See Comments)    Night terrors   Amlodipine Itching   Hydralazine     Night terrors, fatigued   Labetalol Itching    No redness - tingling/itching. Takes '100mg'$  twice daily     Valsartan     Night terrors and fatigued    Social History   Substance and Sexual  Activity  Alcohol Use Yes   Alcohol/week: 1.0 - 2.0 standard drink of alcohol   Types: 1 - 2 Cans of beer per week   Comment: 1-2 beers 1-2 times a week 08/26/21    Social History   Tobacco Use  Smoking Status Former  Smokeless Tobacco Never  Tobacco Comments   Quit 2021    Review of Systems  Constitutional: Negative.   HENT: Negative.    Eyes: Negative.   Respiratory: Negative.    Cardiovascular: Negative.   Gastrointestinal: Negative.   Genitourinary: Negative.   Musculoskeletal: Negative.   Skin: Negative.   Neurological: Negative.   Endo/Heme/Allergies: Negative.   Psychiatric/Behavioral: Negative.      Objective   Vitals:   03/23/22 1441  BP: (!) 169/111  Pulse: 81  Resp: 16  Temp: 98 F (36.7 C)  SpO2: 98%    Physical Exam Vitals reviewed.  Constitutional:      Appearance: Normal appearance. He is normal weight.  HENT:     Head: Normocephalic and atraumatic.  Neck:     Comments: 3.5 cm oval subcutaneous mass with punctum present on posterior neck.  No fluctuance noted.  It is just to the left of midline. Cardiovascular:     Rate and Rhythm: Normal rate and regular rhythm.  Heart sounds: Normal heart sounds. No murmur heard.    No friction rub. No gallop.  Pulmonary:     Effort: Pulmonary effort is normal. No respiratory distress.     Breath sounds: Normal breath sounds. No stridor. No wheezing, rhonchi or rales.  Musculoskeletal:     Cervical back: Normal range of motion and neck supple.  Skin:    General: Skin is warm and dry.  Neurological:     Mental Status: He is alert and oriented to person, place, and time.     Assessment  Sebaceous cyst, neck Plan  Patient is scheduled for excision of the sebaceous cyst, neck on 04/09/2022.  The risks and benefits of the procedure including bleeding and infection were fully explained to the patient, who gave informed consent.

## 2022-03-24 NOTE — Progress Notes (Signed)
Scott [name]; [id number]; 1950/07/29   HPI Patient is a 71 year old white male who was referred to my care by [facility] for evaluation treatment of a subcutaneous mass along the posterior neck.  He has had a lipoma removed from his neck in the remote past.  The lump seems to be enlarging in size and is causing him some discomfort when he lies down and pressure was applied.  No drainage has been noted. 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    Past Surgical History:  Procedure Laterality Date   ABDOMINAL AORTIC ENDOVASCULAR STENT GRAFT  02/02/2020    type b   APPENDECTOMY     THORACIC AORTIC ENDOVASCULAR STENT GRAFT N/A 02/02/2020   Procedure: REPAIR TYPE B THORACIC AORTIC ENDOVASCULAR STENT;  Surgeon: [name], MD;  Location: [facility] OR;  Service: Vascular;  Laterality: N/A;    Family History  Problem Relation Age of Onset   Lung cancer Sister     Current Outpatient Medications on File Prior to Visit  Medication Sig Dispense Refill   acetaminophen (TYLENOL) 325 MG tablet Take 975 mg by mouth as needed for mild pain.     aspirin EC 81 MG tablet Take 1 tablet by mouth 2 (two) times daily.     labetalol (NORMODYNE) 100 MG tablet Take 1 tablet (100 mg total) by mouth 3 (three) times daily with meals. 90 tablet 3   Multiple Vitamin (MULTIVITAMIN) tablet Take 1 tablet by mouth every other day.     VITAMIN D PO Take 1 tablet by mouth every other day.     No current facility-administered medications on file prior to visit.    Allergies  Allergen Reactions   Carvedilol Other (See Comments)    Night terrors   Amlodipine Itching   Hydralazine     Night terrors, fatigued   Labetalol Itching    No redness - tingling/itching. Takes '100mg'$  twice daily     Valsartan     Night terrors and fatigued    Social History   Substance and Sexual  Activity  Alcohol Use Yes   Alcohol/week: 1.0 - 2.0 standard drink of alcohol   Types: 1 - 2 Cans of beer per week   Comment: 1-2 beers 1-2 times a week 08/26/21    Social History   Tobacco Use  Smoking Status Former  Smokeless Tobacco Never  Tobacco Comments   Quit 2021    Review of Systems  Constitutional: Negative.   HENT: Negative.    Eyes: Negative.   Respiratory: Negative.    Cardiovascular: Negative.   Gastrointestinal: Negative.   Genitourinary: Negative.   Musculoskeletal: Negative.   Skin: Negative.   Neurological: Negative.   Endo/Heme/Allergies: Negative.   Psychiatric/Behavioral: Negative.      Objective   Vitals:   03/23/22 1441  BP: (!) 169/111  Pulse: 81  Resp: 16  Temp: 98 F (36.7 C)  SpO2: 98%    Physical Exam Vitals reviewed.  Constitutional:      Appearance: Normal appearance. He is normal weight.  HENT:     Head: Normocephalic and atraumatic.  Neck:     Comments: 3.5 cm oval subcutaneous mass with punctum present on posterior neck.  No fluctuance noted.  It is just to the left of midline. Cardiovascular:     Rate and Rhythm: Normal rate and regular rhythm.  Heart sounds: Normal heart sounds. No murmur heard.    No friction rub. No gallop.  Pulmonary:     Effort: Pulmonary effort is normal. No respiratory distress.     Breath sounds: Normal breath sounds. No stridor. No wheezing, rhonchi or rales.  Musculoskeletal:     Cervical back: Normal range of motion and neck supple.  Skin:    General: Skin is warm and dry.  Neurological:     Mental Status: He is alert and oriented to person, place, and time.     Assessment  Sebaceous cyst, neck Plan  Patient is scheduled for excision of the sebaceous cyst, neck on 04/09/2022.  The risks and benefits of the procedure including bleeding and infection were fully explained to the patient, who gave informed consent.

## 2022-04-02 ENCOUNTER — Other Ambulatory Visit (HOSPITAL_COMMUNITY): Payer: Self-pay

## 2022-04-06 ENCOUNTER — Encounter (HOSPITAL_COMMUNITY)
Admission: RE | Admit: 2022-04-06 | Discharge: 2022-04-06 | Disposition: A | Discharge status: Home / Self Care | Payer: Medicare Other | Source: Ambulatory Visit | Attending: General Surgery | Admitting: General Surgery

## 2022-04-08 NOTE — Progress Notes (Signed)
Patient called to cancel procedure for Neck Cyst Removal on 04/09/2022 due to insurance issues.

## 2022-04-09 ENCOUNTER — Encounter (HOSPITAL_COMMUNITY): Admission: RE | Payer: Self-pay | Source: Home / Self Care

## 2022-04-09 ENCOUNTER — Ambulatory Visit (HOSPITAL_COMMUNITY): Admission: RE | Admit: 2022-04-09 | Payer: Medicare Other | Source: Home / Self Care | Admitting: General Surgery

## 2022-04-09 SURGERY — CYST REMOVAL
Anesthesia: General

## 2022-04-11 ENCOUNTER — Encounter (HOSPITAL_COMMUNITY): Payer: Self-pay | Admitting: Emergency Medicine

## 2022-04-11 ENCOUNTER — Emergency Department (HOSPITAL_COMMUNITY): Payer: No Typology Code available for payment source

## 2022-04-11 ENCOUNTER — Emergency Department (HOSPITAL_COMMUNITY)
Admission: EM | Admit: 2022-04-11 | Discharge: 2022-04-12 | Disposition: A | Discharge status: Home / Self Care | Payer: No Typology Code available for payment source | Attending: Emergency Medicine | Admitting: Emergency Medicine

## 2022-04-11 DIAGNOSIS — R0602 Shortness of breath: Secondary | ICD-10-CM

## 2022-04-11 DIAGNOSIS — Z20822 Contact with and (suspected) exposure to covid-19: Secondary | ICD-10-CM | POA: Diagnosis not present

## 2022-04-11 DIAGNOSIS — I5020 Unspecified systolic (congestive) heart failure: Secondary | ICD-10-CM | POA: Insufficient documentation

## 2022-04-11 DIAGNOSIS — Z79899 Other long term (current) drug therapy: Secondary | ICD-10-CM | POA: Insufficient documentation

## 2022-04-11 DIAGNOSIS — I10 Essential (primary) hypertension: Secondary | ICD-10-CM | POA: Diagnosis not present

## 2022-04-11 DIAGNOSIS — Z7982 Long term (current) use of aspirin: Secondary | ICD-10-CM | POA: Insufficient documentation

## 2022-04-11 DIAGNOSIS — I517 Cardiomegaly: Secondary | ICD-10-CM | POA: Diagnosis not present

## 2022-04-11 DIAGNOSIS — J9811 Atelectasis: Secondary | ICD-10-CM | POA: Diagnosis not present

## 2022-04-11 DIAGNOSIS — I2510 Atherosclerotic heart disease of native coronary artery without angina pectoris: Secondary | ICD-10-CM | POA: Diagnosis not present

## 2022-04-11 DIAGNOSIS — Z0389 Encounter for observation for other suspected diseases and conditions ruled out: Secondary | ICD-10-CM | POA: Diagnosis not present

## 2022-04-11 LAB — BASIC METABOLIC PANEL
Anion gap: 12 (ref 5–15)
BUN: 27 mg/dL — ABNORMAL HIGH (ref 8–23)
CO2: 20 mmol/L — ABNORMAL LOW (ref 22–32)
Calcium: 9.7 mg/dL (ref 8.9–10.3)
Chloride: 107 mmol/L (ref 98–111)
Creatinine, Ser: 1.26 mg/dL — ABNORMAL HIGH (ref 0.61–1.24)
GFR, Estimated: >60 mL/min (ref >60)
Glucose, Bld: 102 mg/dL — ABNORMAL HIGH (ref 70–99)
Potassium: 4.3 mmol/L (ref 3.5–5.1)
Sodium: 139 mmol/L (ref 135–145)

## 2022-04-11 LAB — CBC
HCT: 40.1 % (ref 39.0–52.0)
Hemoglobin: 14.1 g/dL (ref 13.0–17.0)
MCH: 35.5 pg — ABNORMAL HIGH (ref 26.0–34.0)
MCHC: 35.2 g/dL (ref 30.0–36.0)
MCV: 101 fL — ABNORMAL HIGH (ref 80.0–100.0)
Platelets: 186 10*3/uL (ref 150–400)
RBC: 3.97 MIL/uL — ABNORMAL LOW (ref 4.22–5.81)
RDW: 13.2 % (ref 11.5–15.5)
WBC: 7 10*3/uL (ref 4.0–10.5)
nRBC: 0 % (ref 0.0–0.2)

## 2022-04-11 LAB — RESP PANEL BY RT-PCR (FLU A&B, COVID) ARPGX2
Influenza A by PCR: NEGATIVE
Influenza B by PCR: NEGATIVE
SARS Coronavirus 2 by RT PCR: NEGATIVE

## 2022-04-11 LAB — TROPONIN I (HIGH SENSITIVITY)
Troponin I (High Sensitivity): 40 ng/L — ABNORMAL HIGH (ref <18)
Troponin I (High Sensitivity): 43 ng/L — ABNORMAL HIGH (ref <18)

## 2022-04-11 LAB — BRAIN NATRIURETIC PEPTIDE: B Natriuretic Peptide: 478.4 pg/mL — ABNORMAL HIGH (ref 0.0–100.0)

## 2022-04-11 MED ORDER — FUROSEMIDE 20 MG PO TABS: 20.0000 mg | ORAL_TABLET | Freq: Every day | ORAL | 0 refills | Status: DC

## 2022-04-11 MED ORDER — IOHEXOL 350 MG/ML SOLN
75.0000 mL | Freq: Once | INTRAVENOUS | Status: AC | PRN
  Administered 2022-04-11: 75 mL via INTRAVENOUS

## 2022-04-11 MED ORDER — FUROSEMIDE 10 MG/ML IJ SOLN
20.0000 mg | Freq: Once | INTRAMUSCULAR | Status: AC
  Administered 2022-04-11: 20 mg via INTRAVENOUS
  Filled 2022-04-11: qty 2

## 2022-04-11 NOTE — ED Provider Notes (Signed)
[facility] Provider Note   CSN: [id number] Arrival date & time: 04/11/22  1746     History  No chief complaint on file.   Scott [name] is a 71 y.o. male with history of HTN, aortic dissection s/p aortic stent graft in 2021, HTN, persistent atrial fibrillation not currently on anticoagulants who presents to the ER complaining of shortness of breath for the past several months, worse the past several days. States he is feeling most short of breath when he initially gets up to do something. No chest pain or tightness. States he doesn't have symptoms associated with his afib. Has not noticed leg pain or swelling, but states his pants are fitting a little tighter than before so his abdomen may be more distended. Mild cough and sore throat, was given amoxicillin by his PCP and just started it today. No fevers or chills.    HPI     Home Medications Prior to Admission medications   Medication Sig Start Date End Date Taking? Authorizing Provider  furosemide (LASIX) 20 MG tablet Take 1 tablet (20 mg total) by mouth daily for 5 days. 04/11/22 04/16/22 Yes Jamera Vanloan T, PA-C  aspirin EC 81 MG tablet Take 1 tablet by mouth 2 (two) times daily. 11/05/21   [provider]  labetalol (NORMODYNE) 100 MG tablet Take 1 tablet (100 mg total) by mouth 3 (three) times daily with meals. 08/26/21   Fenton, Clint R, PA  magnesium gluconate (MAGONATE) 500 MG tablet Take 500 mg by mouth daily.    [provider]  OVER THE COUNTER MEDICATION in the morning and at bedtime. Mullein ( plant that he smokes in pipe)    [provider]  OVER THE COUNTER MEDICATION Take by mouth 2 (two) times daily. Super beets chew    [provider]  Potassium (POTASSIMIN PO) Take 1 tablet by mouth daily.    [provider]  tamsulosin (FLOMAX) 0.4 MG CAPS capsule Take 0.4 mg by mouth daily after supper. 03/12/22   [provider]       Allergies    Carvedilol, Amlodipine, Hydralazine, Labetalol, and Valsartan    Review of Systems   Review of Systems  Constitutional:  Positive for fatigue.  HENT:  Positive for sore throat.   Respiratory:  Positive for cough and shortness of breath.   Cardiovascular:  Negative for chest pain, palpitations and leg swelling.  All other systems reviewed and are negative.   Physical Exam Updated Vital Signs BP (!) 161/123   Pulse (!) 50   Temp 97.6 F (36.4 C) (Oral)   Resp (!) 30   SpO2 100%  Physical Exam Vitals and nursing note reviewed.  Constitutional:      Appearance: Normal appearance.  HENT:     Head: Normocephalic and atraumatic.  Eyes:     Conjunctiva/sclera: Conjunctivae normal.  Cardiovascular:     Rate and Rhythm: Normal rate. Rhythm irregularly irregular.     Comments: Mild pedal edema of the ankles, nonpitting Pulmonary:     Effort: Pulmonary effort is normal. No respiratory distress.     Breath sounds: Normal breath sounds.  Abdominal:     General: There is no distension.     Palpations: Abdomen is soft.     Tenderness: There is no abdominal tenderness.  Skin:    General: Skin is warm and dry.  Neurological:     General: No focal deficit present.     Mental Status: He  is alert.     ED Results / Procedures / Treatments   Labs (all labs ordered are listed, but only abnormal results are displayed) Labs Reviewed  BASIC METABOLIC PANEL - Abnormal; Notable for the following components:      Result Value   CO2 20 (*)    Glucose, Bld 102 (*)    BUN 27 (*)    Creatinine, Ser 1.26 (*)    All other components within normal limits  CBC - Abnormal; Notable for the following components:   RBC 3.97 (*)    MCV 101.0 (*)    MCH 35.5 (*)    All other components within normal limits  BRAIN NATRIURETIC PEPTIDE - Abnormal; Notable for the following components:   B Natriuretic Peptide 478.4 (*)    All other components within normal limits  TROPONIN I (HIGH  SENSITIVITY) - Abnormal; Notable for the following components:   Troponin I (High Sensitivity) 40 (*)    All other components within normal limits  TROPONIN I (HIGH SENSITIVITY) - Abnormal; Notable for the following components:   Troponin I (High Sensitivity) 43 (*)    All other components within normal limits  RESP PANEL BY RT-PCR (FLU A&B, COVID) [id number]    EKG EKG Interpretation  Date/Time:  Sunday April 11 2022 18:03:54 EDT Ventricular Rate:  99 PR Interval:    QRS Duration: 102 QT Interval:  368 QTC Calculation: 472 R Axis:   92 Text Interpretation: Atrial fibrillation Rightward axis ST & T wave abnormality, consider lateral ischemia new Prolonged QT Otherwise no significant change When compared with ECG of 09-Sep-2021 16:08, PREVIOUS ECG IS PRESENT Confirmed by Blanchie Dessert [phone]) on 04/11/2022 7:25:33 PM  Radiology CT Angio Chest PE W and/or Wo Contrast  Result Date: 04/11/2022 CLINICAL DATA:  Concern for pulmonary embolus. EXAM: CT ANGIOGRAPHY CHEST WITH CONTRAST TECHNIQUE: Multidetector CT imaging of the chest was performed using the standard protocol during bolus administration of intravenous contrast. Multiplanar CT image reconstructions and MIPs were obtained to evaluate the vascular anatomy. RADIATION DOSE REDUCTION: This exam was performed according to the departmental dose-optimization program which includes automated exposure control, adjustment of the mA and/or kV according to patient size and/or use of iterative reconstruction technique. CONTRAST:  64m OMNIPAQUE IOHEXOL 350 MG/ML SOLN COMPARISON:  Chest radiograph dated 04/11/2022. FINDINGS: Cardiovascular: There is mild cardiomegaly. Retrograde flow of contrast from the right atrium into the IVC may represent a degree of right heart dysfunction. No pericardial effusion. Coronary vascular calcification of the LAD. Endovascular stent graft repair of the aortic isthmus and descending thoracic aorta. Evaluation of the  aorta is limited due to timing of the contrast. No pulmonary artery embolus identified. Mediastinum/Nodes: No hilar or mediastinal adenopathy. The esophagus is grossly unremarkable. No mediastinal fluid collection. Lungs/Pleura: Linear atelectasis/scarring in the right middle lobe and lingula. No consolidative changes. There is no pleural effusion pneumothorax. The central airways are patent. Upper Abdomen: A 3 cm cyst in the left lobe of the indeterminate 2 cm right renal exophytic lesion demonstrates fluid attenuation, most consistent with a cyst. Musculoskeletal: Osteopenia with degenerative changes of the spine. No acute osseous pathology. Review of the MIP images confirms the above findings. IMPRESSION: 1. No acute intrathoracic pathology. No CT evidence of pulmonary artery embolus. 2. Mild cardiomegaly. 3. Endovascular stent graft repair of the aortic isthmus and descending thoracic aorta. Electronically Signed   By: AAnner CreteM.D.   On: 04/11/2022 22:19   DG Chest 2 View  Result Date:  04/11/2022 CLINICAL DATA:  chf/ sob EXAM: CHEST - 2 VIEW COMPARISON:  Chest x-ray 08/12/2021 FINDINGS: The heart and mediastinal contours are unchanged. Descending thoracic aorta stent. Resolution of right lung airspace opacity compared to February 23. No focal consolidation. No pulmonary edema. No pleural effusion. No pneumothorax. No acute osseous abnormality. IMPRESSION: No active cardiopulmonary disease. Electronically Signed   By: Iven Finn M.D.   On: 04/11/2022 19:00    Procedures Procedures    Medications Ordered in ED Medications  furosemide (LASIX) injection 20 mg (has no administration in time range)  iohexol (OMNIPAQUE) 350 MG/ML injection 75 mL (75 mLs Intravenous Contrast Given 04/11/22 2159)    ED Course/ Medical Decision Making/ A&P                           Medical Decision Making Amount and/or Complexity of Data Reviewed Labs: ordered. Radiology: ordered.   This patient is  a 71 y.o. male  who presents to the ED for concern of shortness of breath for several months, worse in the past several days.   Differential diagnoses prior to evaluation: The emergent differential diagnosis includes, but is not limited to,  CHF, pericardial effusion/tamponade, arrhythmias, ACS, COPD, asthma, bronchitis, pneumonia, pneumothorax, PE, anemia, neuromuscular disorder.  This is not an exhaustive differential.   Past Medical History / Co-morbidities: HTN, aortic dissection s/p aortic stent graft in 2021, HTN, persistent atrial fibrillation not currently on anticoagulants  Additional history: Chart reviewed. Pertinent results include: Per chart review, echocardiogram in July 2023 showed EF 35-40% performed at the [name]. Last appointment with cardiology was 9/28 where they discussed the results, and discussed treatment options. They recommended further cardiac workup with stress testing and/or catheterization, but patient declined. He is currently on labetalol and is rate controlled, declined changes to his regimen. He agreed to restart eliquis as of two days ago. Had previously stopped taking it due to some blood in his stool. Has not restarted it yet.   Physical Exam: Physical exam performed. The pertinent findings include: Currently in atrial fibrillation. Normal oxygen saturation on room air, saturations maintained at 95% while ambulating. Lung sounds clear. Mild edema of the ankles, non-pitting.   Lab Tests/Imaging studies: I personally interpreted labs/imaging and the pertinent results include: No leukocytosis, normal hemoglobin.  Kidney function stable compared to prior.  BNP 478.4, none prior to compare to.  Initial troponin 40, delta troponin 43. Troponins appear stable compared to previous.   Chest x-ray without acute cardiopulmonary abnormalities, stable cardiomegaly. CT PE study shows no evidence of blood clot. I agree with the radiologist interpretation.  Cardiac  monitoring: EKG obtained and interpreted by my attending physician which shows: atrial fibrillation. I agree with this interpretation.    Medications: I ordered medication including lasix.  I have reviewed the patients home medicines and have made adjustments as needed.   Disposition: After consideration of the diagnostic results and the patients response to treatment, I feel that emergency department workup does not suggest an emergent condition requiring admission or immediate intervention beyond what has been performed at this time. The plan is: discharge to home with prescription for lasix for symptomatic heart failure and encourage follow up with cardiologist. Low concern for HF exacerbation. Imaging negative for PE or pneumonia. Afib is rate controlled with current medication regimen. The patient is safe for discharge and has been instructed to return immediately for worsening symptoms, change in symptoms or any other concerns.  I discussed this case with my attending physician Dr. Maryan Rued who cosigned this note including patient's presenting symptoms, physical exam, and planned diagnostics and interventions. Attending physician stated agreement with plan or made changes to plan which were implemented.    Final Clinical Impression(s) / ED Diagnoses Final diagnoses:  Shortness of breath  Systolic heart failure, unspecified HF chronicity ([place])    Rx / DC Orders ED Discharge Orders          Ordered    furosemide (LASIX) 20 MG tablet  Daily        04/11/22 2252           Portions of this report may have been transcribed using voice recognition software. Every effort was made to ensure accuracy; however, inadvertent computerized transcription errors may be present.    Estill Cotta 04/11/22 2301    Blanchie Dessert, MD 04/15/22 [phone]

## 2022-04-11 NOTE — ED Notes (Signed)
Pt start with 148/98 Pt took about a 3 min walk where the lowest his SpO2 was 95%. The pt complained of leg fatigue and displayed labored breathing when finished ambulation. It pt stated " the shortness of breath come and go, but I  dont usually feel this winded. Pts BP after ambulating was 144/101 with a finishing SpO2 of 96%.

## 2022-04-11 NOTE — Discharge Instructions (Addendum)
You were seen in the emergency department for shortness of breath.  As we discussed, your lab work and imaging today did not show any evidence of blood clots or infection. I suspect your shortness of breath is related to your heart.   We are placing you on a medication called Lasix to help take off extra fluid. Follow up with your cardiologist on Wednesday to talk about managing your heart failure with medications.

## 2022-04-11 NOTE — ED Notes (Signed)
RN walked pt to the bathroom and while his gait was steady he was pretty SOB by the time he returned.  Pt's O2 level never dropped below 95% however his work of breathing increased.  He did recover w/in about 4 minutes after getting back in bed.  Provider aware.

## 2022-04-11 NOTE — ED Notes (Signed)
Pt to CT

## 2022-04-11 NOTE — ED Triage Notes (Signed)
Pt here form home with c/o sob that has been getting worse, had echo at the [name] recently and sent over for workup for chf , also in afib  but stopped his blood thinner in march but was not told to stop it

## 2022-04-11 NOTE — ED Notes (Signed)
EDP in room w/ pt and family

## 2022-04-11 NOTE — ED Notes (Signed)
Assumed care, pt placed on monitor.  PA bedside.  Pt states he has had increased SOB X2weeks.  RN noticed that he had increased SOB when he changed his clothing into hospital gown.  States he noticed it more when he tries to "move around."  Denies any chest pain.

## 2022-04-12 ENCOUNTER — Telehealth: Payer: Self-pay

## 2022-04-12 NOTE — Telephone Encounter (Signed)
Transition Care Management Unsuccessful Follow-up Telephone Call  Date of discharge and from where: [facility] 04/12/2022  Attempts:  1st Attempt  Reason for unsuccessful TCM follow-up call:  spoke with patient, states he will follow up with [facility] in [place] as we are not his PCP

## 2022-06-20 IMAGING — CT CT ANGIO CHEST-ABD-PELV FOR DISSECTION W/ AND WO/W CM
2 of 7 series · 11 of 46 positions shown, 12 images · IV contrast (OMNI 350)
Comparison: 01/30/2020, 01/29/2020

CLINICAL DATA: Follow-up aortic dissection, history of increasing
back pain

EXAM:
CT ANGIOGRAPHY CHEST, ABDOMEN AND PELVIS
TECHNIQUE: Non-contrast CT of the chest was initially obtained.

[Series 7: dissection 2mm · axial · 0.77mm/px · z∈[-566,-64]mm · 8 of 319 slices shown, 9 images]
[im 34/319  soft-tissue]
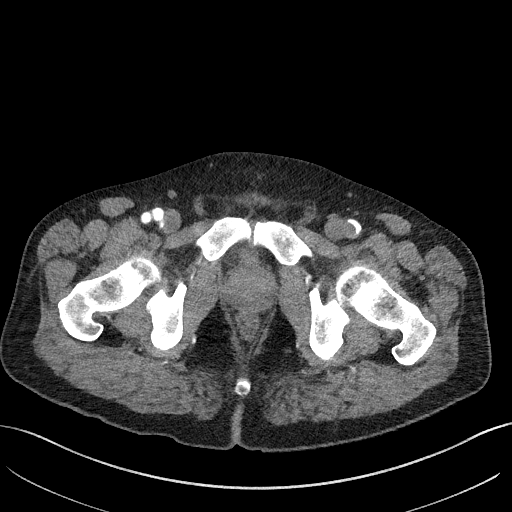
[im 34/319  bone]
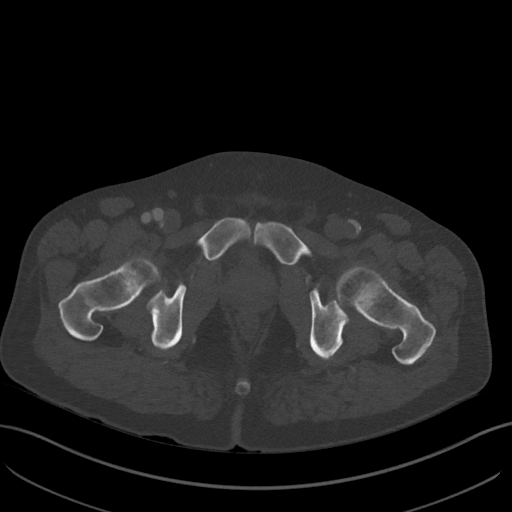
[im 67/319  soft-tissue]
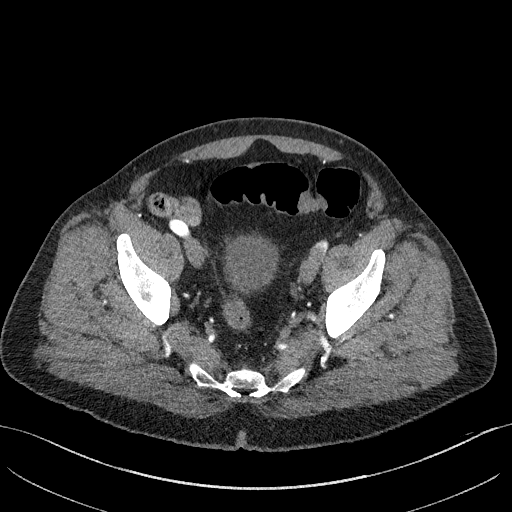
[im 101/319  soft-tissue]
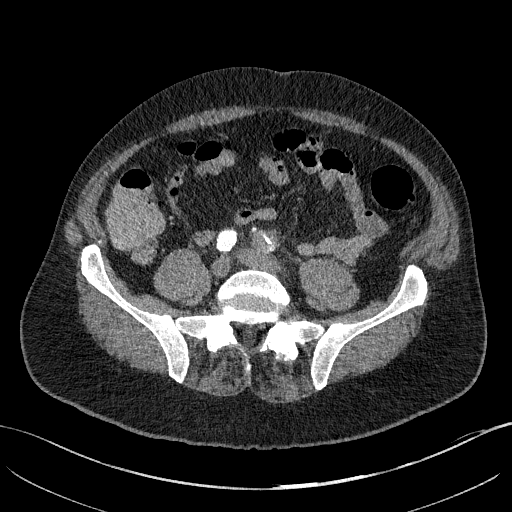
[im 134/319  soft-tissue]
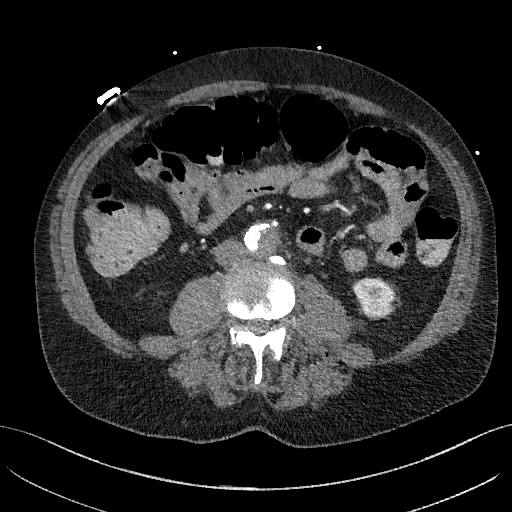
[im 185/319  soft-tissue]
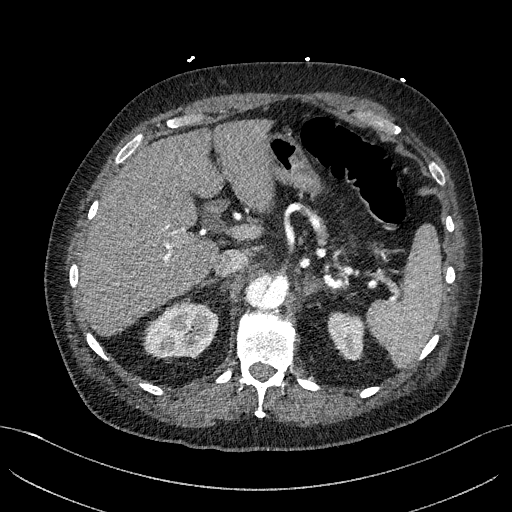
[im 218/319  soft-tissue]
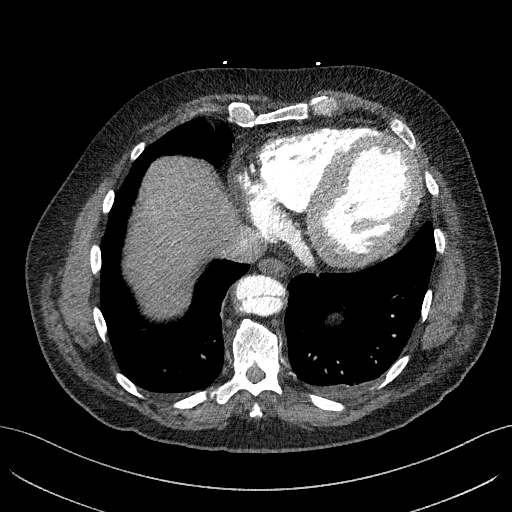
[im 252/319  soft-tissue]
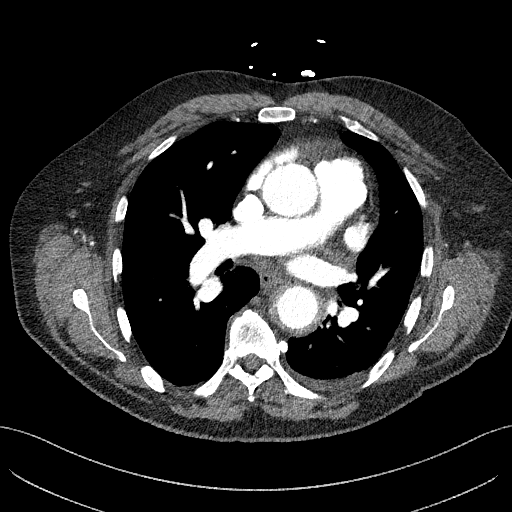
[im 285/319  soft-tissue]
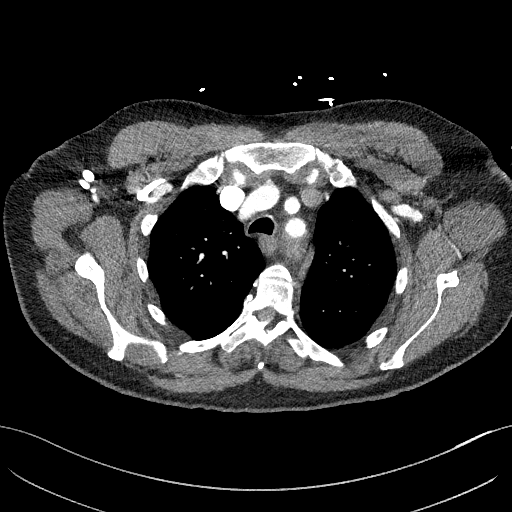

[Series 10: dissection 2mm cor · coronal · 0.72mm/px · 3 of 151 slices shown]
[im 38/151  soft-tissue]
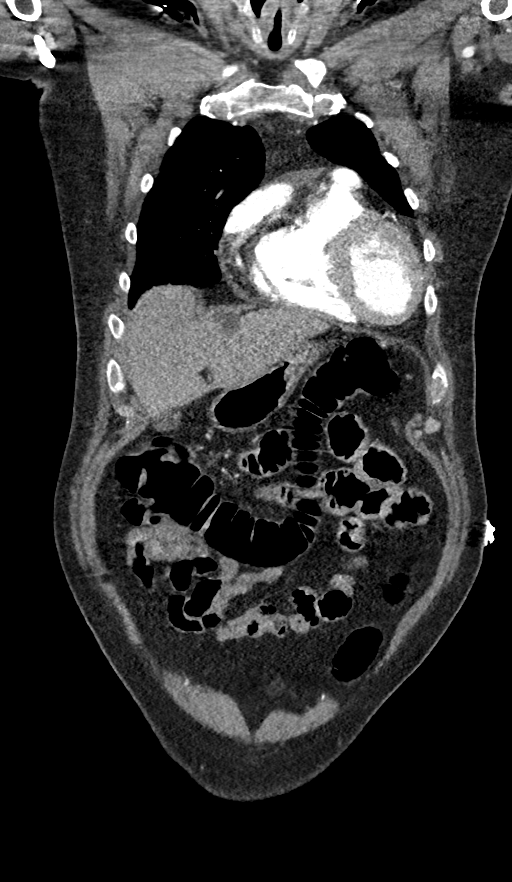
[im 76/151  soft-tissue]
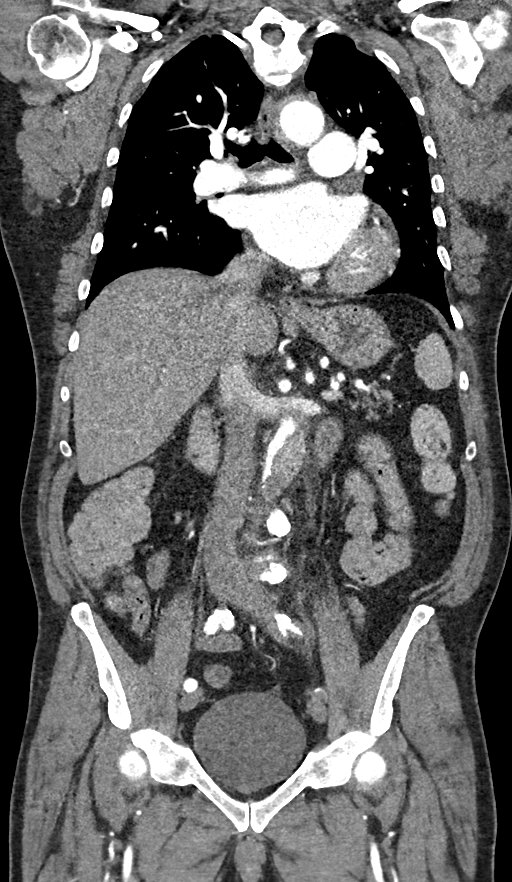
[im 113/151  soft-tissue]
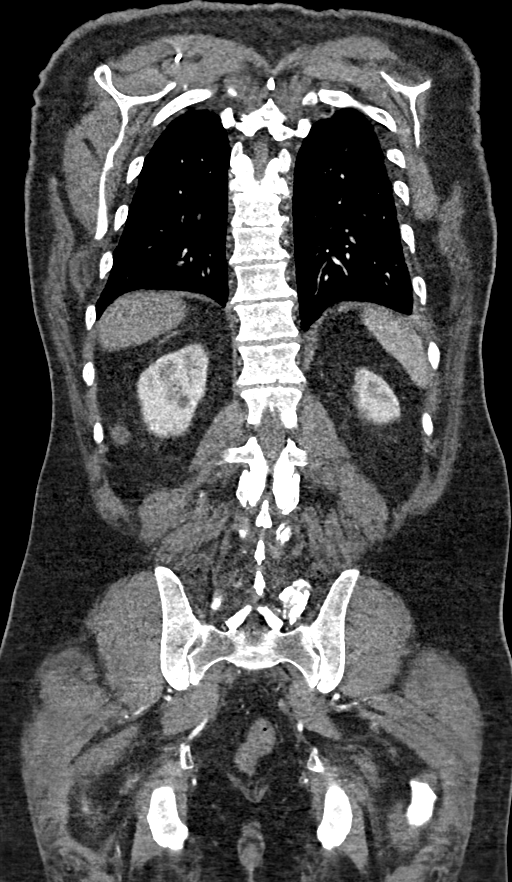

[11 of 46 positions shown; findings below may reference images not displayed]

Multidetector CT imaging through the chest, abdomen and pelvis was
performed using the standard protocol during bolus administration of
intravenous contrast. Multiplanar reconstructed images and MIPs were
obtained and reviewed to evaluate the vascular anatomy.

CONTRAST:  100mL OMNIPAQUE IOHEXOL 350 MG/ML SOLN
FINDINGS: CTA CHEST FINDINGS

Cardiovascular: Initial precontrast images demonstrate no new
hyperdense crescent to suggest acute aortic injury. The previously
seen crescent is less prominent on the current exam.

Following contrast enhancement there is adequate visualization of
the thoracic aorta. Mild prominence of the ascending aorta to 4 cm
is noted. This is similar to that seen on the prior exam. Just
beyond the origin of the left subclavian artery there is evidence of
intramural hematoma and dissection similar to that seen on prior
exam. Some minimal contrast enhancement of the intramural hematoma
is noted proximally best seen on image number 52 of series 7. This
is new from the prior exam.

The descending thoracic aorta again demonstrates the dissection flap
which extends inferiorly into the abdominal aorta. This dissection
flap is stable in appearance when compared with the prior CT of 3
days previous. Heart is at the upper limits of normal in size. No
pulmonary emboli are noted.

Mediastinum/Nodes: Esophagus is within normal limits. No hilar or
mediastinal adenopathy is noted. The thoracic inlet is unremarkable.

Lungs/Pleura: The lungs are well aerated bilaterally. No focal
infiltrate is seen. Small left-sided pleural effusion is noted new
from the prior study.

Musculoskeletal: Degenerative changes of the thoracic spine are
noted.

Review of the MIP images confirms the above findings.

CTA ABDOMEN AND PELVIS FINDINGS

VASCULAR

Aorta: The distal descending thoracic aortic dissection extends
inferiorly into the abdominal aorta. There is however increasing
size of the false lumen when compared with the prior exam and
significant compression upon the infrarenal aorta with significant
narrowing of the true lumen identified. At the level of the
bifurcation the degree of compression on the true lumen is
alleviated although the dissection flap extends into the left common
iliac artery and subsequently into the internal and external iliac
arteries on the left. There is increasing compression on the true
lumen in both the common and external iliac arteries with decreased
flow identified. The degree of stenosis in the left external iliac
arteries is significant with slit like lumen residual.

Celiac: Celiac axis arises from the true lumen and is widely patent.

SMA: Also arises from the true lumen and widely patent.

Renals: Right renal artery arises from the true lumen and is widely
patent. Two renal arteries on the left are identified. The more
superior renal artery arises from the true lumen in the more
inferior renal artery arises from the both the true and false lumen.
Some stenosis is again identified at the origin of the inferior left
renal artery.

IMA: The IMA arises from the true lumen and is patent despite the
relative collapse of the infrarenal aorta due to the increased level
of dissection.

Iliacs: As described above the right iliac artery arises from the
true lumen and is widely patent. The left iliac artery arises from a
combination of both the true and false lumen. Dissection again
causes significant stenosis of the left common iliac artery. This
now however extends further into the external iliac artery with
increasing compression of the true lumen.

Veins: No specific venous abnormality is noted.

Review of the MIP images confirms the above findings.

NON-VASCULAR

Hepatobiliary: Vicarious excretion of contrast is noted within the
gallbladder. Cyst is seen within left lobe of the liver stable in
appearance. Liver is otherwise within normal limits.

Pancreas: Fatty interdigitation of the pancreas is noted. No focal
mass is noted.

Spleen: Spleen is within normal limits.

Adrenals/Urinary Tract: Adrenal glands are unremarkable. Kidneys
demonstrate a normal enhancement pattern. Exophytic cyst is noted in
the right kidney stable in appearance. No renal calculi are seen.
The bladder is within normal limits.

Stomach/Bowel: In the sigmoid colon there is a focal area of luminal
narrowing identified best seen on image number 256 of series 7. This
is likely related to spasm although the possibility of an underlying
lesion could not be totally excluded. Diverticular changes noted.
The more proximal colon appears within normal limits. The appendix
is not well appreciated. No inflammatory changes to suggest
appendicitis are noted. Small bowel and stomach appear within normal
limits.

Lymphatic: No significant lymphadenopathy is noted.

Reproductive: Prostate is unremarkable.

Other: No abdominal wall hernia or abnormality. No abdominopelvic
ascites.

Musculoskeletal: No acute or significant osseous findings.

Review of the MIP images confirms the above findings.
IMPRESSION: Progression of the known abdominal aortic dissection with increasing
size of the false lumen causing significant compression upon the
true lumen when compared with the exam from 3 days previous. This
extends further into the left external iliac artery when compared
with the prior exam with luminal compromise of at least 75-80% of
the distal left external iliac artery.

The more proximal portion of the dissection is stable with the
exception of a small area of enhancement intramural hematoma along
the medial aspect of the proximal descending thoracic aorta. This is
best visualized on image number 52 of series 7.

Dilatation of the ascending aorta to 4 cm Recommend annual imaging
followup by CTA or MRA. This recommendation follows 3666
ACCF/AHA/AATS/ACR/ASA/SCA/SHALLEY/YEYE/HURCIUC/GIORGI Guidelines for the
Diagnosis and Management of Patients with Thoracic Aortic Disease.
Circulation. 3666; 121: [id number]. Aortic aneurysm NOS ([id number])

Area of narrowing within the sigmoid colon which is likely related
to spasm although the possibility of an underlying lesion could not
be totally excluded given the appearance. Further workup can be
performed when clinically able.

Critical Value/emergent results were called by telephone at the time
of interpretation on 02/02/2020 at [DATE] to provider Paloma
Tiger, Manuella, who verbally acknowledged these results.

## 2022-08-18 ENCOUNTER — Inpatient Hospital Stay (HOSPITAL_COMMUNITY)
Admission: EM | Admit: 2022-08-18 | Discharge: 2022-08-21 | DRG: 375 | Disposition: A | Discharge status: Home / Self Care | Payer: No Typology Code available for payment source | Attending: Family Medicine | Admitting: Family Medicine

## 2022-08-18 ENCOUNTER — Emergency Department (HOSPITAL_COMMUNITY): Payer: No Typology Code available for payment source

## 2022-08-18 DIAGNOSIS — N182 Chronic kidney disease, stage 2 (mild): Secondary | ICD-10-CM | POA: Diagnosis present

## 2022-08-18 DIAGNOSIS — K6289 Other specified diseases of anus and rectum: Secondary | ICD-10-CM | POA: Diagnosis not present

## 2022-08-18 DIAGNOSIS — I509 Heart failure, unspecified: Secondary | ICD-10-CM | POA: Diagnosis present

## 2022-08-18 DIAGNOSIS — N400 Enlarged prostate without lower urinary tract symptoms: Secondary | ICD-10-CM | POA: Diagnosis present

## 2022-08-18 DIAGNOSIS — Z1152 Encounter for screening for COVID-19: Secondary | ICD-10-CM

## 2022-08-18 DIAGNOSIS — Z888 Allergy status to other drugs, medicaments and biological substances status: Secondary | ICD-10-CM

## 2022-08-18 DIAGNOSIS — N1831 Chronic kidney disease, stage 3a: Secondary | ICD-10-CM | POA: Diagnosis present

## 2022-08-18 DIAGNOSIS — I7121 Aneurysm of the ascending aorta, without rupture: Secondary | ICD-10-CM | POA: Diagnosis present

## 2022-08-18 DIAGNOSIS — I10 Essential (primary) hypertension: Secondary | ICD-10-CM | POA: Diagnosis not present

## 2022-08-18 DIAGNOSIS — R194 Change in bowel habit: Secondary | ICD-10-CM | POA: Diagnosis not present

## 2022-08-18 DIAGNOSIS — E041 Nontoxic single thyroid nodule: Secondary | ICD-10-CM | POA: Diagnosis present

## 2022-08-18 DIAGNOSIS — Z7901 Long term (current) use of anticoagulants: Secondary | ICD-10-CM | POA: Diagnosis not present

## 2022-08-18 DIAGNOSIS — I130 Hypertensive heart and chronic kidney disease with heart failure and stage 1 through stage 4 chronic kidney disease, or unspecified chronic kidney disease: Secondary | ICD-10-CM | POA: Diagnosis present

## 2022-08-18 DIAGNOSIS — D500 Iron deficiency anemia secondary to blood loss (chronic): Secondary | ICD-10-CM | POA: Diagnosis not present

## 2022-08-18 DIAGNOSIS — J189 Pneumonia, unspecified organism: Secondary | ICD-10-CM | POA: Diagnosis present

## 2022-08-18 DIAGNOSIS — K56600 Partial intestinal obstruction, unspecified as to cause: Secondary | ICD-10-CM | POA: Diagnosis present

## 2022-08-18 DIAGNOSIS — C20 Malignant neoplasm of rectum: Principal | ICD-10-CM | POA: Diagnosis present

## 2022-08-18 DIAGNOSIS — I429 Cardiomyopathy, unspecified: Secondary | ICD-10-CM | POA: Diagnosis present

## 2022-08-18 DIAGNOSIS — Z87891 Personal history of nicotine dependence: Secondary | ICD-10-CM

## 2022-08-18 DIAGNOSIS — Z79899 Other long term (current) drug therapy: Secondary | ICD-10-CM | POA: Diagnosis not present

## 2022-08-18 DIAGNOSIS — Z7982 Long term (current) use of aspirin: Secondary | ICD-10-CM

## 2022-08-18 DIAGNOSIS — N189 Chronic kidney disease, unspecified: Secondary | ICD-10-CM | POA: Diagnosis not present

## 2022-08-18 DIAGNOSIS — K56609 Unspecified intestinal obstruction, unspecified as to partial versus complete obstruction: Secondary | ICD-10-CM | POA: Diagnosis not present

## 2022-08-18 DIAGNOSIS — R109 Unspecified abdominal pain: Secondary | ICD-10-CM | POA: Diagnosis present

## 2022-08-18 DIAGNOSIS — K5669 Other partial intestinal obstruction: Secondary | ICD-10-CM | POA: Diagnosis not present

## 2022-08-18 DIAGNOSIS — I4819 Other persistent atrial fibrillation: Secondary | ICD-10-CM | POA: Diagnosis present

## 2022-08-18 DIAGNOSIS — Z8679 Personal history of other diseases of the circulatory system: Secondary | ICD-10-CM | POA: Diagnosis not present

## 2022-08-18 DIAGNOSIS — I129 Hypertensive chronic kidney disease with stage 1 through stage 4 chronic kidney disease, or unspecified chronic kidney disease: Secondary | ICD-10-CM | POA: Diagnosis not present

## 2022-08-18 DIAGNOSIS — D62 Acute posthemorrhagic anemia: Secondary | ICD-10-CM | POA: Diagnosis present

## 2022-08-18 DIAGNOSIS — D374 Neoplasm of uncertain behavior of colon: Secondary | ICD-10-CM | POA: Diagnosis not present

## 2022-08-18 DIAGNOSIS — R933 Abnormal findings on diagnostic imaging of other parts of digestive tract: Secondary | ICD-10-CM | POA: Diagnosis not present

## 2022-08-18 DIAGNOSIS — I7102 Dissection of abdominal aorta: Secondary | ICD-10-CM | POA: Diagnosis present

## 2022-08-18 LAB — HEPATIC FUNCTION PANEL
ALT: 15 U/L (ref 0–44)
AST: 25 U/L (ref 15–41)
Albumin: 3.3 g/dL — ABNORMAL LOW (ref 3.5–5.0)
Alkaline Phosphatase: 68 U/L (ref 38–126)
Bilirubin, Direct: 0.3 mg/dL — ABNORMAL HIGH (ref 0.0–0.2)
Indirect Bilirubin: 0.9 mg/dL (ref 0.3–0.9)
Total Bilirubin: 1.2 mg/dL (ref 0.3–1.2)
Total Protein: 6.9 g/dL (ref 6.5–8.1)

## 2022-08-18 LAB — RETICULOCYTES
Immature Retic Fract: 10.9 % (ref 2.3–15.9)
RBC.: 3.62 MIL/uL — ABNORMAL LOW (ref 4.22–5.81)
Retic Count, Absolute: 35.1 10*3/uL (ref 19.0–186.0)
Retic Ct Pct: 1 % (ref 0.4–3.1)

## 2022-08-18 LAB — COMPREHENSIVE METABOLIC PANEL
ALT: 17 U/L (ref 0–44)
AST: 22 U/L (ref 15–41)
Albumin: 3.7 g/dL (ref 3.5–5.0)
Alkaline Phosphatase: 71 U/L (ref 38–126)
Anion gap: 12 (ref 5–15)
BUN: 22 mg/dL (ref 8–23)
CO2: 20 mmol/L — ABNORMAL LOW (ref 22–32)
Calcium: 9.1 mg/dL (ref 8.9–10.3)
Chloride: 104 mmol/L (ref 98–111)
Creatinine, Ser: 1.34 mg/dL — ABNORMAL HIGH (ref 0.61–1.24)
GFR, Estimated: 56 mL/min — ABNORMAL LOW (ref >60)
Glucose, Bld: 112 mg/dL — ABNORMAL HIGH (ref 70–99)
Potassium: 3.8 mmol/L (ref 3.5–5.1)
Sodium: 136 mmol/L (ref 135–145)
Total Bilirubin: 1.1 mg/dL (ref 0.3–1.2)
Total Protein: 6.7 g/dL (ref 6.5–8.1)

## 2022-08-18 LAB — CK: Total CK: 119 U/L (ref 49–397)

## 2022-08-18 LAB — VITAMIN B12: Vitamin B-12: 514 pg/mL (ref 180–914)

## 2022-08-18 LAB — LIPASE, BLOOD: Lipase: 27 U/L (ref 11–51)

## 2022-08-18 LAB — CBC WITH DIFFERENTIAL/PLATELET
Abs Immature Granulocytes: 0.05 10*3/uL (ref 0.00–0.07)
Basophils Absolute: 0 10*3/uL (ref 0.0–0.1)
Basophils Relative: 0 %
Eosinophils Absolute: 0 10*3/uL (ref 0.0–0.5)
Eosinophils Relative: 0 %
HCT: 37 % — ABNORMAL LOW (ref 39.0–52.0)
Hemoglobin: 12.6 g/dL — ABNORMAL LOW (ref 13.0–17.0)
Immature Granulocytes: 0 %
Lymphocytes Relative: 13 %
Lymphs Abs: 1.6 10*3/uL (ref 0.7–4.0)
MCH: 32.7 pg (ref 26.0–34.0)
MCHC: 34.1 g/dL (ref 30.0–36.0)
MCV: 96.1 fL (ref 80.0–100.0)
Monocytes Absolute: 0.8 10*3/uL (ref 0.1–1.0)
Monocytes Relative: 7 %
Neutro Abs: 10.1 10*3/uL — ABNORMAL HIGH (ref 1.7–7.7)
Neutrophils Relative %: 80 %
Platelets: 364 10*3/uL (ref 150–400)
RBC: 3.85 MIL/uL — ABNORMAL LOW (ref 4.22–5.81)
RDW: 12.6 % (ref 11.5–15.5)
WBC: 12.7 10*3/uL — ABNORMAL HIGH (ref 4.0–10.5)
nRBC: 0 % (ref 0.0–0.2)

## 2022-08-18 LAB — RESP PANEL BY RT-PCR (RSV, FLU A&B, COVID)  RVPGX2
Influenza A by PCR: NEGATIVE
Influenza B by PCR: NEGATIVE
Resp Syncytial Virus by PCR: NEGATIVE
SARS Coronavirus 2 by RT PCR: NEGATIVE

## 2022-08-18 LAB — PHOSPHORUS: Phosphorus: 2.9 mg/dL (ref 2.5–4.6)

## 2022-08-18 LAB — IRON AND TIBC
Iron: 87 ug/dL (ref 45–182)
Saturation Ratios: 23 % (ref 17.9–39.5)
TIBC: 377 ug/dL (ref 250–450)
UIBC: 290 ug/dL

## 2022-08-18 LAB — FOLATE: Folate: 32.1 ng/mL (ref >5.9)

## 2022-08-18 LAB — MAGNESIUM: Magnesium: 2.4 mg/dL (ref 1.7–2.4)

## 2022-08-18 LAB — TSH: TSH: 1.833 u[IU]/mL (ref 0.350–4.500)

## 2022-08-18 LAB — POC OCCULT BLOOD, ED: Fecal Occult Bld: POSITIVE — AB

## 2022-08-18 LAB — FERRITIN: Ferritin: 18 ng/mL — ABNORMAL LOW (ref 24–336)

## 2022-08-18 LAB — LACTIC ACID, PLASMA: Lactic Acid, Venous: 1.6 mmol/L (ref 0.5–1.9)

## 2022-08-18 MED ORDER — SODIUM CHLORIDE 0.9 % IV BOLUS
500.0000 mL | Freq: Once | INTRAVENOUS | Status: AC
  Administered 2022-08-18: 500 mL via INTRAVENOUS

## 2022-08-18 MED ORDER — TAMSULOSIN HCL 0.4 MG PO CAPS
0.4000 mg | ORAL_CAPSULE | Freq: Every day | ORAL | Status: DC
  Administered 2022-08-19 – 2022-08-20 (×2): 0.4 mg via ORAL
  Filled 2022-08-18 (×2): qty 1

## 2022-08-18 MED ORDER — SODIUM CHLORIDE 0.9 % IV BOLUS
1000.0000 mL | Freq: Once | INTRAVENOUS | Status: AC
  Administered 2022-08-18: 1000 mL via INTRAVENOUS

## 2022-08-18 MED ORDER — SODIUM CHLORIDE 0.9 % IV SOLN
500.0000 mg | INTRAVENOUS | Status: DC
  Administered 2022-08-18: 500 mg via INTRAVENOUS
  Filled 2022-08-18: qty 5

## 2022-08-18 MED ORDER — LABETALOL HCL 200 MG PO TABS
100.0000 mg | ORAL_TABLET | Freq: Three times a day (TID) | ORAL | Status: DC
  Administered 2022-08-18 – 2022-08-21 (×8): 100 mg via ORAL
  Filled 2022-08-18 (×8): qty 1

## 2022-08-18 MED ORDER — IOHEXOL 350 MG/ML SOLN
90.0000 mL | Freq: Once | INTRAVENOUS | Status: AC | PRN
  Administered 2022-08-18: 90 mL via INTRAVENOUS

## 2022-08-18 MED ORDER — PIPERACILLIN-TAZOBACTAM 3.375 G IVPB 30 MIN
3.3750 g | Freq: Once | INTRAVENOUS | Status: AC
  Administered 2022-08-18: 3.375 g via INTRAVENOUS
  Filled 2022-08-18: qty 50

## 2022-08-18 MED ORDER — SODIUM CHLORIDE 0.9 % IV SOLN: INTRAVENOUS | Status: AC

## 2022-08-18 MED ORDER — ACETAMINOPHEN 650 MG RE SUPP: 650.0000 mg | Freq: Four times a day (QID) | RECTAL | Status: DC | PRN

## 2022-08-18 MED ORDER — HYDROCODONE-ACETAMINOPHEN 5-325 MG PO TABS
1.0000 | ORAL_TABLET | ORAL | Status: DC | PRN
  Administered 2022-08-19 – 2022-08-21 (×6): 1 via ORAL
  Filled 2022-08-18: qty 2
  Filled 2022-08-18: qty 1
  Filled 2022-08-18: qty 2
  Filled 2022-08-18 (×3): qty 1

## 2022-08-18 MED ORDER — ACETAMINOPHEN 325 MG PO TABS
650.0000 mg | ORAL_TABLET | Freq: Four times a day (QID) | ORAL | Status: DC | PRN
  Filled 2022-08-18 (×2): qty 2

## 2022-08-18 MED ORDER — PIPERACILLIN-TAZOBACTAM 3.375 G IVPB
3.3750 g | Freq: Three times a day (TID) | INTRAVENOUS | Status: DC
  Administered 2022-08-19 – 2022-08-20 (×5): 3.375 g via INTRAVENOUS
  Filled 2022-08-18 (×5): qty 50

## 2022-08-18 NOTE — ED Notes (Signed)
ED TO INPATIENT HANDOFF REPORT  ED Nurse Name and Phone #: Mechele Claude, [phone]  S Name/Age/Gender Scott [name] 72 y.o. male Room/Bed: 023C/023C  Code Status   Code Status: Full Code  Home/SNF/Other Home Patient oriented to: self, place, time, and situation Is this baseline? Yes   Triage Complete: Triage complete  Chief Complaint Proctitis [K62.89]  Triage Note Pt to ED requesting medication adjustment. Reports stopped taking 10 days because "making sick" reports felt bad for 2 months. Reports cancelled apt with PCP and come to ED    Allergies Allergies  Allergen Reactions   Carvedilol Other (See Comments)    Night terrors   Amlodipine Itching   Hydralazine     Night terrors, fatigued   Labetalol Itching    No redness - tingling/itching. Takes '100mg'$  twice daily     Valsartan     Night terrors and fatigued    Level of Care/Admitting Diagnosis ED Disposition     ED Disposition  Admit   Condition  --   Comment  Hospital Area: [facility] [[id number]]  Level of Care: Telemetry Medical [104]  May admit patient to [facility] or [facility] if equivalent level of care is available:: No  Covid Evaluation: Asymptomatic - no recent exposure (last 10 days) testing not required  Diagnosis: Proctitis [id number]  Admitting Physician: [place], [place]  Attending Physician: Toy Baker [id number]  Certification:: I certify this patient will need inpatient services for at least 2 midnights  Estimated Length of Stay: 2          B Medical/Surgery History Past Medical History:  Diagnosis Date   Dissection of aorta, 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   Essential hypertension 02/14/2020   Hypertension    Tobacco abuse, 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   Past Surgical History:  Procedure Laterality Date   ABDOMINAL AORTIC ENDOVASCULAR STENT GRAFT  02/02/2020    type b   APPENDECTOMY     THORACIC AORTIC ENDOVASCULAR STENT GRAFT N/A 02/02/2020   Procedure: REPAIR  TYPE B THORACIC AORTIC ENDOVASCULAR STENT;  Surgeon: [name], MD;  Location: [facility];  Service: Vascular;  Laterality: N/A;     A IV Location/Drains/Wounds Patient Lines/Drains/Airways Status     Active Line/Drains/Airways     Name Placement date Placement time Site Days   Peripheral IV 08/18/22 20 G Right Antecubital 08/18/22  1744  Antecubital  less than 1   Incision (Closed) 02/02/20 Groin Right 02/02/20  1528  -- 928            Intake/Output Last 24 hours No intake or output data in the 24 hours ending 08/18/22 2149  Labs/Imaging Results for orders placed or performed during the hospital encounter of 08/18/22 (from the past 48 hour(s))  Resp panel by RT-PCR (RSV, Flu A&B, Covid) Anterior Nasal Swab     Status: None   Collection Time: 08/18/22 12:56 PM   Specimen: Anterior Nasal Swab  Result Value Ref Range   SARS Coronavirus 2 by RT PCR NEGATIVE NEGATIVE   Influenza A by PCR NEGATIVE NEGATIVE   Influenza B by PCR NEGATIVE NEGATIVE    Comment: (NOTE) The Xpert Xpress SARS-CoV-2/FLU/RSV plus assay is intended as an aid in the diagnosis of influenza from Nasopharyngeal swab specimens and should not be used as a sole basis for treatment. Nasal washings and aspirates are unacceptable for Xpert Xpress SARS-CoV-2/FLU/RSV testing.  Fact Sheet for Patients: EntrepreneurPulse.com.au  Fact Sheet for Healthcare Providers: IncredibleEmployment.be  This test is not yet  approved or cleared by the [place] and has been authorized for detection and/or diagnosis of SARS-CoV-2 by FDA under an Emergency Use Authorization (EUA). This EUA will remain in effect (meaning this test can be used) for the duration of the COVID-19 declaration under Section 564(b)(1) of the Act, 21 U.S.C. section 360bbb-3(b)(1), unless the authorization is terminated or revoked.     Resp Syncytial Virus by PCR NEGATIVE NEGATIVE    Comment: (NOTE) Fact  Sheet for Patients: EntrepreneurPulse.com.au  Fact Sheet for Healthcare Providers: IncredibleEmployment.be  This test is not yet approved or cleared by the [place] FDA and has been authorized for detection and/or diagnosis of SARS-CoV-2 by FDA under an Emergency Use Authorization (EUA). This EUA will remain in effect (meaning this test can be used) for the duration of the COVID-19 declaration under Section 564(b)(1) of the Act, 21 U.S.C. section 360bbb-3(b)(1), unless the authorization is terminated or revoked.  Performed at [facility], [place] [address] [place], [place] [address]   CBC with Differential     Status: Abnormal   Collection Time: 08/18/22  1:05 PM  Result Value Ref Range   WBC 12.7 (H) 4.0 - 10.5 K/uL   RBC 3.85 (L) 4.22 - 5.81 MIL/uL   Hemoglobin 12.6 (L) 13.0 - 17.0 g/dL   HCT 37.0 (L) 39.0 - 52.0 %   MCV 96.1 80.0 - 100.0 fL   MCH 32.7 26.0 - 34.0 pg   MCHC 34.1 30.0 - 36.0 g/dL   RDW 12.6 11.5 - 15.5 %   Platelets 364 150 - 400 K/uL   nRBC 0.0 0.0 - 0.2 %   Neutrophils Relative % 80 %   Neutro Abs 10.1 (H) 1.7 - 7.7 K/uL   Lymphocytes Relative 13 %   Lymphs Abs 1.6 0.7 - 4.0 K/uL   Monocytes Relative 7 %   Monocytes Absolute 0.8 0.1 - 1.0 K/uL   Eosinophils Relative 0 %   Eosinophils Absolute 0.0 0.0 - 0.5 K/uL   Basophils Relative 0 %   Basophils Absolute 0.0 0.0 - 0.1 K/uL   Immature Granulocytes 0 %   Abs Immature Granulocytes 0.05 0.00 - 0.07 K/uL    Comment: Performed at [facility], [place] [address], [place], [place] [address]  Comprehensive metabolic panel     Status: Abnormal   Collection Time: 08/18/22  1:05 PM  Result Value Ref Range   Sodium 136 135 - 145 mmol/L   Potassium 3.8 3.5 - 5.1 mmol/L   Chloride 104 98 - 111 mmol/L   CO2 20 (L) 22 - 32 mmol/L   Glucose, Bld 112 (H) 70 - 99 mg/dL    Comment: Glucose reference range applies only to samples taken after fasting for at least 8  hours.   BUN 22 8 - 23 mg/dL   Creatinine, Ser 1.34 (H) 0.61 - 1.24 mg/dL   Calcium 9.1 8.9 - 10.3 mg/dL   Total Protein 6.7 6.5 - 8.1 g/dL   Albumin 3.7 3.5 - 5.0 g/dL   AST 22 15 - 41 U/L   ALT 17 0 - 44 U/L   Alkaline Phosphatase 71 38 - 126 U/L   Total Bilirubin 1.1 0.3 - 1.2 mg/dL   GFR, Estimated 56 (L) >60 mL/min    Comment: (NOTE) Calculated using the CKD-EPI Creatinine Equation (2021)    Anion gap 12 5 - 15    Comment: Performed at [facility] [address], [place],  [address]  Lipase, blood     Status: None   Collection Time: 08/18/22  1:05 PM  Result Value Ref Range   Lipase 27 11 - 51 U/L    Comment: Performed at [facility], [place] [address], [place], [place] [address]  Magnesium     Status: None   Collection Time: 08/18/22  1:05 PM  Result Value Ref Range   Magnesium 2.4 1.7 - 2.4 mg/dL    Comment: Performed at [facility], [place] [address] [place], [place] [address]  POC occult blood, ED     Status: Abnormal   Collection Time: 08/18/22  5:32 PM  Result Value Ref Range   Fecal Occult Bld POSITIVE (A) NEGATIVE   CT Angio Chest/Abd/Pel for Dissection W and/or Wo Contrast  Result Date: 08/18/2022 CLINICAL DATA:  Acute aortic syndrome, diarrhea. History of aortic dissection status post repair. EXAM: CT ANGIOGRAPHY CHEST, ABDOMEN AND PELVIS TECHNIQUE: Non-contrast CT of the chest was initially obtained. Multidetector CT imaging through the chest, abdomen and pelvis was performed using the standard protocol during bolus administration of intravenous contrast. Multiplanar reconstructed images and MIPs were obtained and reviewed to evaluate the vascular anatomy. RADIATION DOSE REDUCTION: This exam was performed according to the departmental dose-optimization program which includes automated exposure control, adjustment of the mA and/or kV according to patient size and/or use of iterative reconstruction technique. CONTRAST:  52m OMNIPAQUE IOHEXOL 350  MG/ML SOLN COMPARISON:  04/11/2022, 07/02/2020 FINDINGS: CTA CHEST FINDINGS Cardiovascular: The ascending aorta demonstrates stable dilation measuring 4.1 cm in greatest dimension. No intramural hematoma, dissection, or aneurysm. Endovascular stent graft repair of the descending thoracic aorta has been performed with the proximal landing zone just distal to the left subclavian artery with overlapping stents extending to the diaphragmatic hiatus. No aneurysm or endoleak. Mild coronary artery calcification within the left anterior descending coronary artery. Global cardiac size is within normal limits. No pericardial effusion. Central pulmonary arteries are of normal caliber. No intraluminal filling defect identified through the segmental level to suggest acute pulmonary embolism. Mediastinum/Nodes: 19 mm right thyroid nodule, not well characterized on this examination. No pathologic thoracic adenopathy. Esophagus unremarkable. Lungs/Pleura: Subtle tree-in-bud peribronchial nodularity has developed within the right upper lobe, possibly infectious in the acute setting. No confluent pulmonary infiltrate. Bronchial wall thickening is again seen in keeping with airway inflammation. No central obstructing lesion. No pneumothorax or pleural effusion. Musculoskeletal: No acute bone abnormality. No lytic or blastic bone lesion. Review of the MIP images confirms the above findings. CTA ABDOMEN AND PELVIS FINDINGS VASCULAR Aorta: Normal caliber aorta without aneurysm, dissection, vasculitis or significant stenosis. Mild atherosclerotic calcification. Celiac: Variant anatomy with these celiac axis given rise only to the left gastric and splenic arteries. Widely patent. No aneurysm or dissection. SMA: Replaced common hepatic artery. Widely patent. No aneurysm or dissection. Renals: Single right renal artery demonstrates mild mixed atherosclerotic plaque within the mid segment resulting in a focal 50% stenosis in this location.  Dual left renal arteries are widely patent. Normal vascular morphology. No aneurysm or dissection. IMA: Widely patent Inflow: Patent without evidence of aneurysm, dissection, vasculitis or significant stenosis. Veins: No obvious venous abnormality within the limitations of this arterial phase study. Review of the MIP images confirms the above findings. NON-VASCULAR Hepatobiliary: Stable simple cortical cyst within the left hepatic lobe. No enhancing intrahepatic mass. No intra or extrahepatic biliary ductal dilation. Gallbladder unremarkable. Pancreas: Unremarkable. No pancreatic ductal dilatation or surrounding inflammatory changes. Spleen: Unremarkable Adrenals/Urinary Tract: Stable exophytic  simple cyst arising from the lateral aspect of the interpolar region of the right kidney better characterized on prior examinations. No follow-up imaging is recommended for this lesion. The kidneys are otherwise unremarkable. Adrenal glands are unremarkable. Bladder unremarkable. Stomach/Bowel: There is asymmetric left lateral rectal wall thickening involving the low rectum, best seen at axial image # 278/7 with punctate foci of extraluminal gas identified and extensive soft tissue infiltration within the left perirectal soft tissues. This may represent inflammatory changes secondary to traumatic or inflammatory ulceration/perforation or a rectal mass with transmural extension into the perirectal soft tissues. There is superimposed circumferential bowel wall thickening involving the rectosigmoid colon proximally in keeping with proctocolitis. There is moderate volume stool within the colon proximally in keeping with a partial large bowel obstruction. The stomach, small bowel, and large bowel are otherwise unremarkable. No free intraperitoneal gas or fluid. Lymphatic: No pathologic adenopathy within the abdomen and pelvis. Reproductive: The prostate gland is not enlarged. Seminal vesicles are unremarkable. Inflammatory changes  involving the rectum abut the left posterior lobe of the prostate gland. Other: No abdominal wall hernia. Musculoskeletal: Degenerative changes are seen within the lumbar spine. No acute bone abnormality. No lytic or blastic bone lesion. Review of the MIP images confirms the above findings. IMPRESSION: 1. Status post endovascular stent graft repair of the descending thoracic aorta. No aneurysm or endoleak. 2. Stable 4.1 cm ascending aortic aneurysm. Recommend annual imaging followup by CTA or MRA. This recommendation follows 2010 ACCF/AHA/AATS/ACR/ASA/SCA/SCAI/SIR/STS/SVM Guidelines for the Diagnosis and Management of Patients with Thoracic Aortic Disease. Circulation. 2010; [id number]. Aortic aneurysm NOS (ICD10-I71.9) 3. Mild coronary artery calcification. 4. 19 mm right thyroid nodule, not well characterized on this examination. 5. Interval development of mild tree-in-bud peribronchial nodularity within the right upper lobe, possibly infectious in the acute setting. 6. Asymmetric left lateral rectal wall thickening involving the low rectum with punctate foci of extraluminal gas and extensive soft tissue infiltration within the left perirectal soft tissues. This may represent inflammatory changes secondary to traumatic or inflammatory ulceration/perforation or a rectal mass with transmural extension into the perirectal soft tissues. Endoscopic correlation is recommended. Superimposed proximal proctocolitis and resultant partial large bowel obstruction with moderate colonic stool burden. Aortic Atherosclerosis (ICD10-I70.0). Electronically Signed   By: Fidela Salisbury M.D.   On: 08/18/2022 19:29    Pending Labs Unresulted Labs (From admission, onward)     Start     Ordered   08/19/22 0500  Comprehensive metabolic panel  Tomorrow morning,   R       Question:  Release to patient  Answer:  Immediate   08/18/22 2136   08/19/22 0500  CBC  Tomorrow morning,   R       Question:  Release to patient  Answer:   Immediate   08/18/22 2136   08/19/22 0500  Prealbumin  Tomorrow morning,   R        08/18/22 2136   08/18/22 2137  CK  Add-on,   AD        08/18/22 2136   08/18/22 2137  Phosphorus  Add-on,   AD        08/18/22 2136   08/18/22 2137  TSH  Add-on,   AD        08/18/22 2136   08/18/22 2137  Lactic acid, plasma  STAT Now then every 3 hours,   R     Question:  Release to patient  Answer:  Immediate   08/18/22 2136   08/18/22  2137  Hepatic function panel  Add-on,   AD       Question:  Release to patient  Answer:  Immediate   08/18/22 2136   08/18/22 2137  Vitamin B12  (Anemia Panel (PNL))  Once,   R        08/18/22 2136   08/18/22 2137  Folate  (Anemia Panel (PNL))  Once,   R        08/18/22 2136   08/18/22 2137  Iron and TIBC  (Anemia Panel (PNL))  Once,   R        08/18/22 2136   08/18/22 2137  Ferritin  (Anemia Panel (PNL))  Once,   R        08/18/22 2136   08/18/22 2137  Reticulocytes  (Anemia Panel (PNL))  Once,   R        08/18/22 2136   08/18/22 1256  Urinalysis, Routine w reflex microscopic -Urine, Clean Catch  (ED Abdominal Pain)  Once,   URGENT       Question:  Specimen Source  Answer:  Urine, Clean Catch   08/18/22 1256            Vitals/Pain Today's Vitals   08/18/22 2045 08/18/22 2115 08/18/22 2122 08/18/22 2130  BP: (!) 134/99 (!) 149/95  (!) 151/81  Pulse: 61 (!) 111  67  Resp: (!) '23 17  13  '$ Temp:   98.6 F (37 C)   TempSrc:   Oral   SpO2: 99% 99%  98%  Weight:      Height:      PainSc:        Isolation Precautions No active isolations  Medications Medications  piperacillin-tazobactam (ZOSYN) IVPB 3.375 g (has no administration in time range)  tamsulosin (FLOMAX) capsule 0.4 mg (has no administration in time range)  0.9 %  sodium chloride infusion ( Intravenous New Bag/Given 08/18/22 2146)  acetaminophen (TYLENOL) tablet 650 mg (has no administration in time range)    Or  acetaminophen (TYLENOL) suppository 650 mg (has no administration in time  range)  HYDROcodone-acetaminophen (NORCO/VICODIN) 5-325 MG per tablet 1-2 tablet (has no administration in time range)  sodium chloride 0.9 % bolus 1,000 mL (0 mLs Intravenous Stopped 08/18/22 1956)  iohexol (OMNIPAQUE) 350 MG/ML injection 90 mL (90 mLs Intravenous Contrast Given 08/18/22 1824)  piperacillin-tazobactam (ZOSYN) IVPB 3.375 g (0 g Intravenous Stopped 08/18/22 2111)    Mobility walks     Focused Assessments Neuro Assessment Handoff:  Swallow screen pass? Yes          Neuro Assessment:   Neuro Checks:      Has TPA been given? No If patient is a Neuro Trauma and patient is going to OR before floor call report to [place] nurse: [phone] or [phone]   R Recommendations: See Admitting Provider Note  Report given to:   Additional Notes: pt is AAOx4. Pt is on room air. Pt is ambulatory to bathroom.

## 2022-08-18 NOTE — Subjective & Objective (Signed)
Patient presents with ongoing diarrhea and abdominal pain 72 year old male with history of AAA and persistent atrial fibrillation on Eliquis States that ever since his admission in December he has had recurrent diarrhea and abdominal discomfort and he stopped his medications about a month ago including his blood pressure medicines his Eliquis and still continued to have diarrhea he occasionally have had a bit of blood in the stool but not more recently.  Reports left lower quadrant abdominal discomfort patient has tried to reach out to [facility] but could not get an appointment no fevers or chills no chest pain or shortness of breath

## 2022-08-18 NOTE — ED Notes (Signed)
E-signature pad broken, pt gives verbal consent  

## 2022-08-18 NOTE — Assessment & Plan Note (Signed)
CT showing possible infiltrate pt endorses URI symptoms  Started on zosyn will add azithromycin for atypical Covid is negative

## 2022-08-18 NOTE — Assessment & Plan Note (Addendum)
Currently stopped taking Eliquis given GI bleeding we will continue to hold Restart labetalol

## 2022-08-18 NOTE — ED Provider Triage Note (Signed)
Emergency Medicine Provider Triage Evaluation Note  Scott [name] , a 72 y.o. male  was evaluated in triage.  Pt complains of concerns for needing help with his medications.  He notes he stopped taking his medications several weeks ago due to concerns of them causing him to have watery diarrhea.  He is managed at the [name].  He stopped taking his Eliquis on January 19.  He has tried to get in with the [name] however unsuccessful.  Denies chest pain, shortness of breath, urinary symptoms, nausea, vomiting.  Has associated abdominal discomfort, diarrhea.  He would like his magnesium checked today.  Review of Systems  Positive:  Negative:   Physical Exam  BP (!) 167/107 (BP Location: Right Arm)   Pulse 65   Temp 98.7 F (37.1 C) (Oral)   Resp 18   SpO2 100%  Gen:   Awake, no distress   Resp:  Normal effort  MSK:   Moves extremities without difficulty  Other:  No abdominal TTP.   Medical Decision Making  Medically screening exam initiated at 12:55 PM.  Appropriate orders placed.  Scott [name] was informed that the remainder of the evaluation will be completed by another provider, this initial triage assessment does not replace that evaluation, and the importance of remaining in the ED until their evaluation is complete.  Work-up initiated.    Scott [name] A, PA-C 08/18/22 1257

## 2022-08-18 NOTE — Progress Notes (Signed)
Pharmacy Antibiotic Note  Scott [name] is a 72 y.o. male admitted on 08/18/2022 with ongoing diarrhea/abdominal pain and concerns for an  intra-abdominal infection .  Pharmacy has been consulted for Zosyn dosing.  Plan: Zosyn 3.375g IV Q8h Trend WBC, fever, renal function F/u cultures, clinical progress, levels as indicated De-escalate when able  Height: '5\' 8"'$  (172.7 cm) Weight: 73.5 kg (162 lb) IBW/kg (Calculated) : 68.4  Temp (24hrs), Avg:98.6 F (37 C), Min:98.4 F (36.9 C), Max:98.7 F (37.1 C)  Recent Labs  Lab 08/18/22 1305  WBC 12.7*  CREATININE 1.34*    Estimated Creatinine Clearance: 47.5 mL/min (A) (by C-G formula based on SCr of 1.34 mg/dL (H)).    Allergies  Allergen Reactions   Carvedilol Other (See Comments)    Night terrors   Amlodipine Itching   Hydralazine     Night terrors, fatigued   Labetalol Itching    No redness - tingling/itching. Takes '100mg'$  twice daily     Valsartan     Night terrors and fatigued    Thank you for allowing pharmacy to be a part of this patient's care.  Mignon Pine 08/18/2022 8:11 PM

## 2022-08-18 NOTE — Assessment & Plan Note (Signed)
Continue Zosyn Appreciate GI consult

## 2022-08-18 NOTE — Assessment & Plan Note (Signed)
Secondary to rectal mass [facility] is aware will see patient in consult in a.m. Keep n.p.o. except for sips with meds

## 2022-08-18 NOTE — Assessment & Plan Note (Signed)
-  chronic avoid nephrotoxic medications such as NSAIDs, Vanco Zosyn combo,  avoid hypotension, continue to follow renal function  

## 2022-08-18 NOTE — H&P (Signed)
Edwards Mindel [id number] DOB: 1951/04/06 DOA: 08/18/2022     PCP: [facility]   Outpatient Specialists:   Has been seen by [place]   Patient arrived to ER on 08/18/22 at 1228 Referred by Attending Horton, Alvin Critchley, DO   Patient coming from:    home Lives alone,       Chief Complaint:   Chief Complaint  Patient presents with   Medication Refill   Abdominal Pain    HPI: Scott [name] is a 72 y.o. 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  Reports left lower quadrant abdominal discomfort patient has tried to reach out to [facility] but could not get an appointment no fevers or chills no chest pain or shortness of breath   Recently have had a bit of chest congestion and sniffles Drink rare Does not smoke Pt never had a colonoscopy bc he had friends with bad outcomes  Initial COVID TEST  NEGATIVE   Lab Results  Component Value Date   [place] NEGATIVE 08/18/2022   [place] NEGATIVE 04/11/2022   [place] NEGATIVE 08/11/2021   [place] NEGATIVE 01/29/2020     Regarding pertinent Chronic problems:    Hyperlipidemia -  now off  statins  Lipid Panel     Component Value Date/Time   TRIG 66 01/30/2020 0121     HTN on labetalol   chronic CHF   - echo was done in July Was on Lasix         A. Fib -  - CHA2DS2 vas score  3   Not on anticoagulation  secondary to recurrent bleeding         -  Rate control:  Currently controlled with labetalol     CKD stage IIIa- baseline Cr 1.2 Estimated Creatinine Clearance: 47.5 mL/min (A) (by C-G formula based on SCr of 1.34 mg/dL (H)).  Lab Results  Component Value Date   CREATININE 1.34 (H) 08/18/2022   CREATININE 1.26 (H) 04/11/2022   CREATININE 1.06 08/13/2021      While in ER:   Ct showed rectal mass and proctitis Started on zosyn  [facility] Gi consulted     CTA chest CTabd/pelvis -  Status post endovascular stent graft repair of the descending thoracic aorta. No aneurysm or endoleak. Stable 4.1 cm ascending aortic aneurysm. Recommend annual imaging followup by CTA or MRA. This recommendation follows 2010 Mild coronary artery calcification.  19 mm right thyroid nodule, not well characterized on this examination.  Interval development of mild tree-in-bud peribronchial nodularity within the right upper lobe, possibly infectious in the acute setting.   Asymmetric left lateral rectal wall thickening involving the low rectum with punctate foci of extraluminal gas and extensive soft tissue infiltration within the left perirectal soft tissues. This may represent inflammatory changes secondary to traumatic or inflammatory ulceration/perforation or a rectal mass with transmural extension into the perirectal soft tissues. Endoscopic correlation is recommended. Superimposed proximal  proctocolitis and resultant partial large bowel obstruction with moderate colonic stool burden.   evidence of infiltrate  Following Medications were ordered in ER: Medications  piperacillin-tazobactam (ZOSYN) IVPB 3.375 g (3.375 g Intravenous New Bag/Given 08/18/22 2040)  piperacillin-tazobactam (ZOSYN) IVPB 3.375 g (has no administration in time range)  sodium chloride 0.9 % bolus 1,000 mL (0 mLs Intravenous Stopped 08/18/22 1956)  iohexol (OMNIPAQUE) 350 MG/ML injection 90 mL (90 mLs Intravenous Contrast Given  08/18/22 1824)    _______________________________________________________ ER Provider Called:  LB Fabienne Bruns Danis   They Recommend admit to medicine   Will see in AM      ED Triage Vitals  Enc Vitals Group     BP 08/18/22 1244 (!) 167/107     Pulse Rate 08/18/22 1244 65     Resp 08/18/22 1244 18     Temp 08/18/22 1244 98.7 F (37.1 C)     Temp Source 08/18/22 1244 Oral     SpO2 08/18/22 1244 100 %     Weight 08/18/22 1316 162 lb (73.5 kg)     Height 08/18/22 1316 '5\' 8"'$  (1.727 m)     Head Circumference --      Peak Flow --      Pain Score 08/18/22 1316 0     Pain Loc --      Pain Edu? --      Excl. in [place]? --   TMAX(24)@     _________________________________________ Significant initial  Findings: Abnormal Labs Reviewed  CBC WITH DIFFERENTIAL/PLATELET - Abnormal; Notable for the following components:      Result Value   WBC 12.7 (*)    RBC 3.85 (*)    Hemoglobin 12.6 (*)    HCT 37.0 (*)    Neutro Abs 10.1 (*)    All other components within normal limits  COMPREHENSIVE METABOLIC PANEL - Abnormal; Notable for the following components:   CO2 20 (*)    Glucose, Bld 112 (*)    Creatinine, Ser 1.34 (*)    GFR, Estimated 56 (*)    All other components within normal limits  POC OCCULT BLOOD, ED - Abnormal; Notable for the following components:   Fecal Occult Bld POSITIVE (*)    All other components within normal limits     ECG: Ordered Personally reviewed and interpreted by me showing: HR : 100 Rhythm: Atrial fibrillation Borderline right axis deviation Probable left ventricular hypertrophy Borderline T abnormalities, inferior leads QTC 440    The recent clinical data is shown below. Vitals:   08/18/22 1745 08/18/22 1900 08/18/22 1930 08/18/22 2000  BP: (!) 177/94 (!) 141/93 (!) 140/126 (!) 122/97  Pulse: 97 (!) 124 (!) 115 84  Resp: '18 14 19 19  '$ Temp:      TempSrc:      SpO2: 100% 99% 99% 99%  Weight:      Height:        WBC     Component Value Date/Time   WBC  12.7 (H) 08/18/2022 1305   LYMPHSABS 1.6 08/18/2022 1305   MONOABS 0.8 08/18/2022 1305   EOSABS 0.0 08/18/2022 1305   BASOSABS 0.0 08/18/2022 1305      UA     Results for orders placed or performed during the hospital encounter of 08/18/22  Resp panel by RT-PCR (RSV, Flu A&B, Covid) Anterior Nasal Swab     Status: None   Collection Time: 08/18/22 12:56 PM   Specimen: Anterior Nasal Swab  Result Value Ref Range Status  SARS Coronavirus 2 by RT PCR NEGATIVE NEGATIVE Final   Influenza A by PCR NEGATIVE NEGATIVE Final   Influenza B by PCR NEGATIVE NEGATIVE Final         Resp Syncytial Virus by PCR NEGATIVE NEGATIVE Final           _______________________________________________ Hospitalist was called for admission for   Rectal mass    Partial intestinal obstruction, unspecified cause ([place])  proctocolitis   The following Work up has been ordered so far:  Orders Placed This Encounter  Procedures   Resp panel by RT-PCR (RSV, Flu A&B, Covid) Anterior Nasal Swab   CT Angio Chest/Abd/Pel for Dissection W and/or Wo Contrast   CBC with Differential   Comprehensive metabolic panel   Lipase, blood   Urinalysis, Routine w reflex microscopic -Urine, Clean Catch   Magnesium   Consult to gastroenterology   piperacillin-tazobactam (ZOSYN) per pharmacy consult   Consult for [place] Admission   POC occult blood, ED   Insert peripheral IV     OTHER Significant initial  Findings:  labs showing:    Recent Labs  Lab 08/18/22 1305  NA 136  K 3.8  CO2 20*  GLUCOSE 112*  BUN 22  CREATININE 1.34*  CALCIUM 9.1  MG 2.4    Cr  Up from baseline see below Lab Results  Component Value Date   CREATININE 1.34 (H) 08/18/2022   CREATININE 1.26 (H) 04/11/2022   CREATININE 1.06 08/13/2021    Recent Labs  Lab 08/18/22 1305  AST 22  ALT 17  ALKPHOS 71  BILITOT 1.1  PROT 6.7  ALBUMIN 3.7   Lab Results  Component Value Date   CALCIUM 9.1 08/18/2022   PHOS 2.8  01/30/2020    Plt: Lab Results  Component Value Date   PLT 364 08/18/2022       COVID-19 Labs  No results for input(s): "DDIMER", "FERRITIN", "LDH", "CRP" in the last 72 hours.  Lab Results  Component Value Date   SARSCOV2NAA NEGATIVE 08/18/2022   SARSCOV2NAA NEGATIVE 04/11/2022   SARSCOV2NAA NEGATIVE 08/11/2021   [place] NEGATIVE 01/29/2020         Recent Labs  Lab 08/18/22 1305  WBC 12.7*  NEUTROABS 10.1*  HGB 12.6*  HCT 37.0*  MCV 96.1  PLT 364    HG/HCT  stable,      Component Value Date/Time   HGB 12.6 (L) 08/18/2022 1305   HCT 37.0 (L) 08/18/2022 1305   MCV 96.1 08/18/2022 1305      Recent Labs  Lab 08/18/22 1305  LIPASE 27     BNP (last 3 results) Recent Labs    04/11/22 1813  BNP 478.4*      DM  labs:  HbA1C: No results for input(s): "HGBA1C" in the last 8760 hours.     CBG (last 3)  No results for input(s): "GLUCAP" in the last 72 hours.        Cultures:    Component Value Date/Time   SDES BLOOD SITE NOT SPECIFIED 08/12/2021 2339   SPECREQUEST  08/12/2021 2339    BOTTLES DRAWN AEROBIC AND ANAEROBIC Blood Culture results may not be optimal due to an inadequate volume of blood received in culture bottles   CULT  08/12/2021 2339    NO GROWTH 5 DAYS Performed at [facility], [place] [address], [place], [place] [address]    REPTSTATUS 08/17/2021 FINAL 08/12/2021 2339     Radiological Exams on Admission: CT Angio Chest/Abd/Pel for Dissection  W and/or Wo Contrast  Result Date: 08/18/2022 CLINICAL DATA:  Acute aortic syndrome, diarrhea. History of aortic dissection status post repair. EXAM: CT ANGIOGRAPHY CHEST, ABDOMEN AND PELVIS TECHNIQUE: Non-contrast CT of the chest was initially obtained. Multidetector CT imaging through the chest, abdomen and pelvis was performed using the standard protocol during bolus administration of intravenous contrast. Multiplanar reconstructed images and MIPs were obtained and reviewed to  evaluate the vascular anatomy. RADIATION DOSE REDUCTION: This exam was performed according to the departmental dose-optimization program which includes automated exposure control, adjustment of the mA and/or kV according to patient size and/or use of iterative reconstruction technique. CONTRAST:  34m OMNIPAQUE IOHEXOL 350 MG/ML SOLN COMPARISON:  04/11/2022, 07/02/2020 FINDINGS: CTA CHEST FINDINGS Cardiovascular: The ascending aorta demonstrates stable dilation measuring 4.1 cm in greatest dimension. No intramural hematoma, dissection, or aneurysm. Endovascular stent graft repair of the descending thoracic aorta has been performed with the proximal landing zone just distal to the left subclavian artery with overlapping stents extending to the diaphragmatic hiatus. No aneurysm or endoleak. Mild coronary artery calcification within the left anterior descending coronary artery. Global cardiac size is within normal limits. No pericardial effusion. Central pulmonary arteries are of normal caliber. No intraluminal filling defect identified through the segmental level to suggest acute pulmonary embolism. Mediastinum/Nodes: 19 mm right thyroid nodule, not well characterized on this examination. No pathologic thoracic adenopathy. Esophagus unremarkable. Lungs/Pleura: Subtle tree-in-bud peribronchial nodularity has developed within the right upper lobe, possibly infectious in the acute setting. No confluent pulmonary infiltrate. Bronchial wall thickening is again seen in keeping with airway inflammation. No central obstructing lesion. No pneumothorax or pleural effusion. Musculoskeletal: No acute bone abnormality. No lytic or blastic bone lesion. Review of the MIP images confirms the above findings. CTA ABDOMEN AND PELVIS FINDINGS VASCULAR Aorta: Normal caliber aorta without aneurysm, dissection, vasculitis or significant stenosis. Mild atherosclerotic calcification. Celiac: Variant anatomy with these celiac axis given rise  only to the left gastric and splenic arteries. Widely patent. No aneurysm or dissection. SMA: Replaced common hepatic artery. Widely patent. No aneurysm or dissection. Renals: Single right renal artery demonstrates mild mixed atherosclerotic plaque within the mid segment resulting in a focal 50% stenosis in this location. Dual left renal arteries are widely patent. Normal vascular morphology. No aneurysm or dissection. IMA: Widely patent Inflow: Patent without evidence of aneurysm, dissection, vasculitis or significant stenosis. Veins: No obvious venous abnormality within the limitations of this arterial phase study. Review of the MIP images confirms the above findings. NON-VASCULAR Hepatobiliary: Stable simple cortical cyst within the left hepatic lobe. No enhancing intrahepatic mass. No intra or extrahepatic biliary ductal dilation. Gallbladder unremarkable. Pancreas: Unremarkable. No pancreatic ductal dilatation or surrounding inflammatory changes. Spleen: Unremarkable Adrenals/Urinary Tract: Stable exophytic simple cyst arising from the lateral aspect of the interpolar region of the right kidney better characterized on prior examinations. No follow-up imaging is recommended for this lesion. The kidneys are otherwise unremarkable. Adrenal glands are unremarkable. Bladder unremarkable. Stomach/Bowel: There is asymmetric left lateral rectal wall thickening involving the low rectum, best seen at axial image # 278/7 with punctate foci of extraluminal gas identified and extensive soft tissue infiltration within the left perirectal soft tissues. This may represent inflammatory changes secondary to traumatic or inflammatory ulceration/perforation or a rectal mass with transmural extension into the perirectal soft tissues. There is superimposed circumferential bowel wall thickening involving the rectosigmoid colon proximally in keeping with proctocolitis. There is moderate volume stool within the colon proximally in  keeping with a partial  large bowel obstruction. The stomach, small bowel, and large bowel are otherwise unremarkable. No free intraperitoneal gas or fluid. Lymphatic: No pathologic adenopathy within the abdomen and pelvis. Reproductive: The prostate gland is not enlarged. Seminal vesicles are unremarkable. Inflammatory changes involving the rectum abut the left posterior lobe of the prostate gland. Other: No abdominal wall hernia. Musculoskeletal: Degenerative changes are seen within the lumbar spine. No acute bone abnormality. No lytic or blastic bone lesion. Review of the MIP images confirms the above findings. IMPRESSION: 1. Status post endovascular stent graft repair of the descending thoracic aorta. No aneurysm or endoleak. 2. Stable 4.1 cm ascending aortic aneurysm. Recommend annual imaging followup by CTA or MRA. This recommendation follows 2010 ACCF/AHA/AATS/ACR/ASA/SCA/SCAI/SIR/STS/SVM Guidelines for the Diagnosis and Management of Patients with Thoracic Aortic Disease. Circulation. 2010; [id number]. Aortic aneurysm NOS (ICD10-I71.9) 3. Mild coronary artery calcification. 4. 19 mm right thyroid nodule, not well characterized on this examination. 5. Interval development of mild tree-in-bud peribronchial nodularity within the right upper lobe, possibly infectious in the acute setting. 6. Asymmetric left lateral rectal wall thickening involving the low rectum with punctate foci of extraluminal gas and extensive soft tissue infiltration within the left perirectal soft tissues. This may represent inflammatory changes secondary to traumatic or inflammatory ulceration/perforation or a rectal mass with transmural extension into the perirectal soft tissues. Endoscopic correlation is recommended. Superimposed proximal proctocolitis and resultant partial large bowel obstruction with moderate colonic stool burden. Aortic Atherosclerosis (ICD10-I70.0). Electronically Signed   By: Fidela Salisbury M.D.   On: 08/18/2022  19:29   _______________________________________________________________________________________________________ Latest  Blood pressure (!) 122/97, pulse 84, temperature 98.4 F (36.9 C), temperature source Oral, resp. rate 19, height '5\' 8"'$  (1.727 m), weight 73.5 kg, SpO2 99 %.   Vitals  labs and radiology finding personally reviewed  Review of Systems:    Pertinent positives include:  , chills, fatigue, weight loss o productive cough,  diarrhea,  Constitutional:  No weight loss, night sweats, Fevers  HEENT:  No headaches, Difficulty swallowing,Tooth/dental problems,Sore throat,  No sneezing, itching, ear ache, nasal congestion, post nasal drip,  Cardio-vascular:  No chest pain, Orthopnea, PND, anasarca, dizziness, palpitations.no Bilateral lower extremity swelling  GI:  No heartburn, indigestion, abdominal pain, nausea, vomiting,change in bowel habits, loss of appetite, melena, blood in stool, hematemesis Resp:  no shortness of breath at rest. No dyspnea on exertion, No excess mucus, n No non-productive cough, No coughing up of blood.No change in color of mucus.No wheezing. Skin:  no rash or lesions. No jaundice GU:  no dysuria, change in color of urine, no urgency or frequency. No straining to urinate.  No flank pain.  Musculoskeletal:  No joint pain or no joint swelling. No decreased range of motion. No back pain.  Psych:  No change in mood or affect. No depression or anxiety. No memory loss.  Neuro: no localizing neurological complaints, no tingling, no weakness, no double vision, no gait abnormality, no slurred speech, no confusion  All systems reviewed and apart from [place] all are negative _______________________________________________________________________________________________ Past Medical History:   Past Medical History:  Diagnosis Date   Dissection of aorta, abdominal ([place]) 02/02/2020   Essential hypertension 02/14/2020   Hypertension    Tobacco abuse, in  remission 02/14/2020     Past Surgical History:  Procedure Laterality Date   ABDOMINAL AORTIC ENDOVASCULAR STENT GRAFT  02/02/2020    type b   APPENDECTOMY     THORACIC AORTIC ENDOVASCULAR STENT GRAFT N/A 02/02/2020   Procedure: REPAIR  TYPE B THORACIC AORTIC ENDOVASCULAR STENT;  Surgeon: [name], MD;  Location[facility];  Service: Vascular;  Laterality: N/A;    Social History:  Ambulatory   independently       reports that he has quit smoking. He has never used smokeless tobacco. He reports current alcohol use of about 1.0 - 2.0 standard drink of alcohol per week. He reports current drug use. Drug: Marijuana.   Family History:   Family History  Problem Relation Age of Onset   Lung cancer Sister    ______________________________________________________________________________________________ Allergies: Allergies  Allergen Reactions   Carvedilol Other (See Comments)    Night terrors   Amlodipine Itching   Hydralazine     Night terrors, fatigued   Labetalol Itching    No redness - tingling/itching. Takes '100mg'$  twice daily     Valsartan     Night terrors and fatigued     Prior to Admission medications   Medication Sig Start Date End Date Taking? Authorizing Provider  aspirin EC 81 MG tablet Take 1 tablet by mouth 2 (two) times daily. 11/05/21   [provider]  furosemide (LASIX) 20 MG tablet Take 1 tablet (20 mg total) by mouth daily for 5 days. 04/11/22 04/16/22  Roemhildt, Lorin T, PA-C  labetalol (NORMODYNE) 100 MG tablet Take 1 tablet (100 mg total) by mouth 3 (three) times daily with meals. 08/26/21   Fenton, Clint R, PA  magnesium gluconate (MAGONATE) 500 MG tablet Take 500 mg by mouth daily.    [provider]  OVER THE COUNTER MEDICATION in the morning and at bedtime. Mullein ( plant that he smokes in pipe)    [provider]  OVER THE COUNTER MEDICATION Take by mouth 2 (two) times daily. Super beets chew    [provider]   Potassium (POTASSIMIN PO) Take 1 tablet by mouth daily.    [provider]  tamsulosin (FLOMAX) 0.4 MG CAPS capsule Take 0.4 mg by mouth daily after supper. 03/12/22   [provider]    ___________________________________________________________________________________________________ Physical Exam:    08/18/2022    8:00 PM 08/18/2022    7:30 PM 08/18/2022    7:00 PM  Vitals with BMI  Systolic [id number]  Diastolic 97 123XX123 93  Pulse 84 115 124     1. General:  in No  Acute distress   Chronically ill   -appearing 2. Psychological: Alert and   Oriented 3. Head/ENT:    Dry Mucous Membranes                          Head Non traumatic, neck supple                           Poor Dentition 4. SKIN: decreased Skin turgor,  Skin clean Dry and intact no rash 5. Heart: Regular rate and rhythm no  Murmur, no Rub or gallop 6. Lungs:  , no wheezes or crackles   7. Abdomen: Soft,  non-tender, Non distended  decreased bowel sounds   8. Lower extremities: no clubbing, cyanosis, no  edema 9. Neurologically Grossly intact, moving all 4 extremities equally   10. MSK: Normal range of motion    Chart has been reviewed  ______________________________________________________________________________________________  Assessment/Plan    72 y.o. 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, , tobacco use, hypertension, persistent atrial fibrillation  on Eliquis   Admitted for  Rectal mass    Partial intestinal obstruction, unspecified cause ([name])  Proctocolitis     Present on Admission:  Proctitis  Dissecting AAA (abdominal aortic aneurysm) ([name])  Essential hypertension  Persistent atrial fibrillation ([name])  Large bowel obstruction ([name])  CKD (chronic kidney disease) stage 2, GFR 60-89 ml/min  CAP (community acquired pneumonia)     Proctitis Continue Zosyn Appreciate GI consult   Dissecting AAA (abdominal aortic  aneurysm) ([place]) History of dissection AAA now status post repair  Essential hypertension Restart labetalol 162mgpo tid  Persistent atrial fibrillation ([name]) Currently stopped taking Eliquis given GI bleeding we will continue to hold Restart labetalol  Large bowel obstruction ([name]) Secondary to rectal mass [facility] GI is aware will see patient in consult in a.m. Keep n.p.o. except for sips with meds  CKD (chronic kidney disease) stage 2, GFR 60-89 ml/min  -chronic avoid nephrotoxic medications such as NSAIDs, Vanco Zosyn combo,  avoid hypotension, continue to follow renal function   CAP (community acquired pneumonia) CT showing possible infiltrate pt endorses URI symptoms  Started on zosyn will add azithromycin for atypical Covid is negative   Other plan as per orders.  DVT prophylaxis:  SCD      Code Status:    Code Status: Prior FULL CODE as per patient  I had personally discussed CODE STATUS with patient and family    Family Communication:   Family  at  Bedside  plan of care was discussed   with   Daughter,   Disposition Plan:      To home once workup is complete and patient is stable   Following barriers for discharge:                                           Will need consultants to evaluate patient prior to discharge                        Would benefit from PT/OT eval prior to DC  Ordered                      Consults called:  [facility] Gi is aware  Admission status:  ED Disposition     [id number]  Level of Care: Telemetry Medical [[id number]]  May admit patient to [facility] equivalent level of care is available:: No  Covid Evaluation: Asymptomatic - no recent exposure (last 10 days) testing not required  Diagnosis: Proctitis [[id number] Admitting Physician: DToy Baker[[id number]]  Attending Physician: DToy Baker[[id number] Certification:: I certify  this patient will need inpatient services for at least 2 midnights  Estimated Length of Stay: 2           inpatient     I Expect 2 midnight stay secondary to severity of patient's current illness need for inpatient interventions justified by the following:   Severe lab/radiological/exam abnormalities including:    Large bowel obstruction and extensive comorbidities including:  CHF CKD    That are currently affecting medical management.   I expect  patient to be hospitalized for 2 midnights requiring inpatient medical care.  Patient is at high risk for adverse outcome (such as loss  of life or disability) if not treated.  Indication for inpatient stay as follows:    Need for operative/procedural  intervention    Need for IV antibiotics, IV fluids,     Level of care     tele  For  24H     Lab Results  Component Value Date   [place] 08/18/2022     Precautions: admitted as   Covid Negative      Caoimhe Damron 08/18/2022, 10:54 PM    [facility]     after 2 AM please page floor coverage PA If 7AM-7PM, please contact the day team taking care of the patient using [email or web address]   Patient was evaluated in the context of the global COVID-19 pandemic, which necessitated consideration that the patient might be at risk for infection with the SARS-CoV-2 virus that causes COVID-19. Institutional protocols and algorithms that pertain to the evaluation of patients at risk for COVID-19 are in a state of rapid change based on information released by regulatory bodies including the CDC and federal and state organizations. These policies and algorithms were followed during the patient's care.

## 2022-08-18 NOTE — ED Provider Notes (Signed)
[facility] Provider Note   CSN: [id number] Arrival date & time: 08/18/22  1228     History  Chief Complaint  Patient presents with   Medication Refill    Scott [name] is a 72 y.o. male.  The history is provided by the patient, a relative and medical records. No language interpreter was used.  Medication Refill    72 year old male with significant history of AAA, tobacco use, hypertension, persistent atrial fibrillation on Eliquis, presenting to the ED requesting for medication adjustment.  Patient report back in December, after having a ruptured AAA patient was placed on a cocktail of medication for which he felt that he cannot tolerate.  States that he has had persistent recurrent diarrhea and abdominal discomfort since starting the new medications.  About a month ago he decided to stop these medications including cholesterol medication, blood pressure medication, and Eliquis.  He still have persistent recurrent diarrhea sometimes with mucus.  Previously he did notice some blood in his stool but that has since resolved.  He endorsed occasional abdominal discomfort localized to his left lower abdomen.  He mention he tries to reach out to his [facility] for outpatient follow-up but have not done an appointment yet.  He did have an appointment with cardiology today but decided to cancel the appointment to come to the ER instead.  He denies any active fever, chills, chest pain, shortness of breath, abdominal pain, dysuria, focal numbness or focal weakness.  He denies any recent antibiotic use.    Home Medications Prior to Admission medications   Medication Sig Start Date End Date Taking? Authorizing Provider  aspirin EC 81 MG tablet Take 1 tablet by mouth 2 (two) times daily. 11/05/21   [provider]  furosemide (LASIX) 20 MG tablet Take 1 tablet (20 mg total) by mouth daily for 5 days. 04/11/22 04/16/22  Roemhildt, Lorin T, PA-C   labetalol (NORMODYNE) 100 MG tablet Take 1 tablet (100 mg total) by mouth 3 (three) times daily with meals. 08/26/21   Fenton, Clint R, PA  magnesium gluconate (MAGONATE) 500 MG tablet Take 500 mg by mouth daily.    [provider]  OVER THE COUNTER MEDICATION in the morning and at bedtime. Mullein ( plant that he smokes in pipe)    [provider]  OVER THE COUNTER MEDICATION Take by mouth 2 (two) times daily. Super beets chew    [provider]  Potassium (POTASSIMIN PO) Take 1 tablet by mouth daily.    [provider]  tamsulosin (FLOMAX) 0.4 MG CAPS capsule Take 0.4 mg by mouth daily after supper. 03/12/22   [provider]      Allergies    Carvedilol, Amlodipine, Hydralazine, Labetalol, and Valsartan    Review of Systems   Review of Systems  All other systems reviewed and are negative.   Physical Exam Updated Vital Signs BP (!) 167/107 (BP Location: Right Arm)   Pulse 65   Temp 98.7 F (37.1 C) (Oral)   Resp 18   Ht '5\' 8"'$  (1.727 m)   Wt 73.5 kg   SpO2 100%   BMI 24.63 kg/m  Physical Exam Vitals and nursing note reviewed.  Constitutional:      General: He is not in acute distress.    Appearance: He is well-developed.  HENT:     Head: Atraumatic.  Eyes:     Conjunctiva/sclera: Conjunctivae normal.  Cardiovascular:     Rate and Rhythm:  Rhythm irregular.     Pulses: Normal pulses.     Heart sounds: Normal heart sounds.  Pulmonary:     Effort: Pulmonary effort is normal.     Breath sounds: Normal breath sounds. No wheezing, rhonchi or rales.  Abdominal:     Palpations: Abdomen is soft.     Tenderness: There is no abdominal tenderness.     Comments: No abdominal bruit or pulsatile mass.  Genitourinary:    Comments: Chaperone present during.  Normal rectal tone, no obvious mass, normal color stool on glove Musculoskeletal:     Cervical back: Neck supple.     Comments: 5 out of 5 strength all 4 extremities.  Skin:     Capillary Refill: Capillary refill takes less than 2 seconds.     Findings: No rash.  Neurological:     Mental Status: He is alert. Mental status is at baseline.  Psychiatric:        Mood and Affect: Mood normal.     ED Results / Procedures / Treatments   Labs (all labs ordered are listed, but only abnormal results are displayed) Labs Reviewed  CBC WITH DIFFERENTIAL/PLATELET - Abnormal; Notable for the following components:      Result Value   WBC 12.7 (*)    RBC 3.85 (*)    Hemoglobin 12.6 (*)    HCT 37.0 (*)    Neutro Abs 10.1 (*)    All other components within normal limits  COMPREHENSIVE METABOLIC PANEL - Abnormal; Notable for the following components:   CO2 20 (*)    Glucose, Bld 112 (*)    Creatinine, Ser 1.34 (*)    GFR, Estimated 56 (*)    All other components within normal limits  RETICULOCYTES - Abnormal; Notable for the following components:   RBC. 3.62 (*)    All other components within normal limits  POC OCCULT BLOOD, ED - Abnormal; Notable for the following components:   Fecal Occult Bld POSITIVE (*)    All other components within normal limits  RESP PANEL BY RT-PCR (RSV, FLU A&B, COVID)  RVPGX2  LIPASE, BLOOD  MAGNESIUM  URINALYSIS, ROUTINE W REFLEX MICROSCOPIC  CK  PHOSPHORUS  PREALBUMIN  LACTIC ACID, PLASMA  LACTIC ACID, PLASMA  HEPATIC FUNCTION PANEL  VITAMIN B12  IRON AND TIBC  FERRITIN  COMPREHENSIVE METABOLIC PANEL  CBC  FOLATE  TSH    EKG None ED ECG REPORT   Date: 08/18/2022  Rate: 100  Rhythm: atrial fibrillation  QRS Axis: normal  Intervals: normal  ST/T Wave abnormalities: nonspecific T wave changes  Conduction Disutrbances:none  Narrative Interpretation:   Old EKG Reviewed: unchanged  I have personally reviewed the EKG tracing and agree with the computerized printout as noted.   Radiology CT Angio Chest/Abd/Pel for Dissection W and/or Wo Contrast  Result Date: 08/18/2022 CLINICAL DATA:  Acute aortic syndrome, diarrhea.  History of aortic dissection status post repair. EXAM: CT ANGIOGRAPHY CHEST, ABDOMEN AND PELVIS TECHNIQUE: Non-contrast CT of the chest was initially obtained. Multidetector CT imaging through the chest, abdomen and pelvis was performed using the standard protocol during bolus administration of intravenous contrast. Multiplanar reconstructed images and MIPs were obtained and reviewed to evaluate the vascular anatomy. RADIATION DOSE REDUCTION: This exam was performed according to the departmental dose-optimization program which includes automated exposure control, adjustment of the mA and/or kV according to patient size and/or use of iterative reconstruction technique. CONTRAST:  61m OMNIPAQUE IOHEXOL 350 MG/ML SOLN COMPARISON:  04/11/2022, 07/02/2020 FINDINGS: CTA  CHEST FINDINGS Cardiovascular: The ascending aorta demonstrates stable dilation measuring 4.1 cm in greatest dimension. No intramural hematoma, dissection, or aneurysm. Endovascular stent graft repair of the descending thoracic aorta has been performed with the proximal landing zone just distal to the left subclavian artery with overlapping stents extending to the diaphragmatic hiatus. No aneurysm or endoleak. Mild coronary artery calcification within the left anterior descending coronary artery. Global cardiac size is within normal limits. No pericardial effusion. Central pulmonary arteries are of normal caliber. No intraluminal filling defect identified through the segmental level to suggest acute pulmonary embolism. Mediastinum/Nodes: 19 mm right thyroid nodule, not well characterized on this examination. No pathologic thoracic adenopathy. Esophagus unremarkable. Lungs/Pleura: Subtle tree-in-bud peribronchial nodularity has developed within the right upper lobe, possibly infectious in the acute setting. No confluent pulmonary infiltrate. Bronchial wall thickening is again seen in keeping with airway inflammation. No central obstructing lesion. No  pneumothorax or pleural effusion. Musculoskeletal: No acute bone abnormality. No lytic or blastic bone lesion. Review of the MIP images confirms the above findings. CTA ABDOMEN AND PELVIS FINDINGS VASCULAR Aorta: Normal caliber aorta without aneurysm, dissection, vasculitis or significant stenosis. Mild atherosclerotic calcification. Celiac: Variant anatomy with these celiac axis given rise only to the left gastric and splenic arteries. Widely patent. No aneurysm or dissection. SMA: Replaced common hepatic artery. Widely patent. No aneurysm or dissection. Renals: Single right renal artery demonstrates mild mixed atherosclerotic plaque within the mid segment resulting in a focal 50% stenosis in this location. Dual left renal arteries are widely patent. Normal vascular morphology. No aneurysm or dissection. IMA: Widely patent Inflow: Patent without evidence of aneurysm, dissection, vasculitis or significant stenosis. Veins: No obvious venous abnormality within the limitations of this arterial phase study. Review of the MIP images confirms the above findings. NON-VASCULAR Hepatobiliary: Stable simple cortical cyst within the left hepatic lobe. No enhancing intrahepatic mass. No intra or extrahepatic biliary ductal dilation. Gallbladder unremarkable. Pancreas: Unremarkable. No pancreatic ductal dilatation or surrounding inflammatory changes. Spleen: Unremarkable Adrenals/Urinary Tract: Stable exophytic simple cyst arising from the lateral aspect of the interpolar region of the right kidney better characterized on prior examinations. No follow-up imaging is recommended for this lesion. The kidneys are otherwise unremarkable. Adrenal glands are unremarkable. Bladder unremarkable. Stomach/Bowel: There is asymmetric left lateral rectal wall thickening involving the low rectum, best seen at axial image # 278/7 with punctate foci of extraluminal gas identified and extensive soft tissue infiltration within the left perirectal  soft tissues. This may represent inflammatory changes secondary to traumatic or inflammatory ulceration/perforation or a rectal mass with transmural extension into the perirectal soft tissues. There is superimposed circumferential bowel wall thickening involving the rectosigmoid colon proximally in keeping with proctocolitis. There is moderate volume stool within the colon proximally in keeping with a partial large bowel obstruction. The stomach, small bowel, and large bowel are otherwise unremarkable. No free intraperitoneal gas or fluid. Lymphatic: No pathologic adenopathy within the abdomen and pelvis. Reproductive: The prostate gland is not enlarged. Seminal vesicles are unremarkable. Inflammatory changes involving the rectum abut the left posterior lobe of the prostate gland. Other: No abdominal wall hernia. Musculoskeletal: Degenerative changes are seen within the lumbar spine. No acute bone abnormality. No lytic or blastic bone lesion. Review of the MIP images confirms the above findings. IMPRESSION: 1. Status post endovascular stent graft repair of the descending thoracic aorta. No aneurysm or endoleak. 2. Stable 4.1 cm ascending aortic aneurysm. Recommend annual imaging followup by CTA or MRA. This recommendation follows 2010  ACCF/AHA/AATS/ACR/ASA/SCA/SCAI/SIR/STS/SVM Guidelines for the Diagnosis and Management of Patients with Thoracic Aortic Disease. Circulation. 2010; [id number]. Aortic aneurysm NOS (ICD10-I71.9) 3. Mild coronary artery calcification. 4. 19 mm right thyroid nodule, not well characterized on this examination. 5. Interval development of mild tree-in-bud peribronchial nodularity within the right upper lobe, possibly infectious in the acute setting. 6. Asymmetric left lateral rectal wall thickening involving the low rectum with punctate foci of extraluminal gas and extensive soft tissue infiltration within the left perirectal soft tissues. This may represent inflammatory changes  secondary to traumatic or inflammatory ulceration/perforation or a rectal mass with transmural extension into the perirectal soft tissues. Endoscopic correlation is recommended. Superimposed proximal proctocolitis and resultant partial large bowel obstruction with moderate colonic stool burden. Aortic Atherosclerosis (ICD10-I70.0). Electronically Signed   By: Fidela Salisbury M.D.   On: 08/18/2022 19:29    Procedures Procedures    Medications Ordered in ED Medications  piperacillin-tazobactam (ZOSYN) IVPB 3.375 g (has no administration in time range)  tamsulosin (FLOMAX) capsule 0.4 mg (has no administration in time range)  0.9 %  sodium chloride infusion ( Intravenous New Bag/Given 08/18/22 2146)  acetaminophen (TYLENOL) tablet 650 mg (has no administration in time range)    Or  acetaminophen (TYLENOL) suppository 650 mg (has no administration in time range)  HYDROcodone-acetaminophen (NORCO/VICODIN) 5-325 MG per tablet 1-2 tablet (has no administration in time range)  sodium chloride 0.9 % bolus 1,000 mL (0 mLs Intravenous Stopped 08/18/22 1956)  iohexol (OMNIPAQUE) 350 MG/ML injection 90 mL (90 mLs Intravenous Contrast Given 08/18/22 1824)  piperacillin-tazobactam (ZOSYN) IVPB 3.375 g (0 g Intravenous Stopped 08/18/22 2111)    ED Course/ Medical Decision Making/ A&P                             Medical Decision Making Amount and/or Complexity of Data Reviewed Radiology: ordered.  Risk Prescription drug management.   BP (!) 167/107 (BP Location: Right Arm)   Pulse 65   Temp 98.7 F (37.1 C) (Oral)   Resp 18   Ht '5\' 8"'$  (1.727 m)   Wt 73.5 kg   SpO2 100%   BMI 24.63 kg/m   62:20 PM 72 year old male with significant history of AAA, tobacco use, hypertension, persistent atrial fibrillation on Eliquis, presenting to the ED requesting for medication adjustment.  Patient report back in December, after having a ruptured AAA patient was placed on a cocktail of medication for which he  felt that he cannot tolerate.  States that he has had persistent recurrent diarrhea and abdominal discomfort since starting the new medications.  About a month ago he decided to stop these medications including cholesterol medication, blood pressure medication, and Eliquis.  He still have persistent recurrent diarrhea sometimes with mucus.  Previously he did notice some blood in his stool but that has since resolved.  He endorsed occasional abdominal discomfort localized to his left lower abdomen.  He mention he tries to reach out to his [facility] for outpatient follow-up but have not done an appointment yet.  He did have an appointment with cardiology today but decided to cancel the appointment to come to the ER instead.  He denies any active fever, chills, chest pain, shortness of breath, abdominal pain, dysuria, focal numbness or focal weakness.  He denies any recent antibiotic use.  On exam this is an elderly male laying in bed appears to be in no acute discomfort.  Heart with irregularly irregular rhythm, lungs  clear to auscultation bilaterally abdomen soft nontender, he has intact distal pulses with equal strength throughout.  He does not have any reproducible abdominal tenderness  Vital sign review notable for mildly elevated blood pressure 167/107.  He is afebrile, no hypoxia  5:02 PM The patient has normal stool on glove.  Daughter now mention his abdominal pain is something new and has been ongoing for the past 3 days.  Given history of AAA, report of noticing blood in his stool in the past, and now having abdominal pain, will obtain chest abdomen pelvis CT scan for further assessment.  I also had shared decision-making with the patient in regards to his impaired renal function and the risk of contrast dye nephropathy.  -Labs ordered, independently viewed and interpreted by me.  Labs remarkable for fecal occult blood test.  Elevated WBC of 12.7.  Cr 1.34, mild bump from prior value. -The  patient was maintained on a cardiac monitor.  I personally viewed and interpreted the cardiac monitored which showed an underlying rhythm of: irregularly irregular -Imaging independently viewed and interpreted by me and I agree with radiologist's interpretation.  Result remarkable for CT chest/abd/pelvis showing no complication from prior AAA, however there's asymmetric left lateral rectal wall thickening involving the low rectum concerning for inflammatory changes 2/2 traumatic or inflammatory ulceration/perforation or a rectal mass with transmural extension into the perirectal soft tissues.  Superimposed proximal proctocolitis and resultant partial large bowel obstruction with moderate colonic stool burden. -This patient presents to the ED for concern of abd pain, this involves an extensive number of treatment options, and is a complaint that carries with it a high risk of complications and morbidity.  The differential diagnosis includes colitis, aortic dissection, diverticular disease, appendicitis, UTI, kidney stone,  -Co morbidities that complicate the patient evaluation includes AAA, afib -Treatment includes sozyn -Reevaluation of the patient after these medicines showed that the patient improved -PCP office notes or outside notes reviewed -Discussion with specialist GI specialist Dr. Loletha Carrow who will be involve in pt care.  [facility] Dr. Roel Cluck who agrees to admit pt -Escalation to admission/observation considered: patient is agreeable with admission.  10:09 PM Patient's finding is concerning for malignancy causing partial bowel obstruction.  He would benefit from sigmoidoscopy tomorrow by GI.  Since he has elevated white count of 12.7 and potential of perforation, I have ordered Zosyn for antibiotic.        Final Clinical Impression(s) / ED Diagnoses Final diagnoses:  Rectal mass  Partial intestinal obstruction, unspecified cause [name])    Rx / DC Orders ED Discharge Orders      None         Domenic Moras, PA-C 08/18/22 [place], Alvin Critchley, DO 08/19/22 [phone]

## 2022-08-18 NOTE — Assessment & Plan Note (Signed)
History of dissection AAA now status post repair

## 2022-08-18 NOTE — Assessment & Plan Note (Addendum)
Restart labetalol 157mgpo tid

## 2022-08-18 NOTE — ED Triage Notes (Addendum)
Pt to ED requesting medication adjustment. Reports stopped taking 10 days because "making sick" reports felt bad for 2 months. Reports cancelled apt with PCP and come to ED

## 2022-08-19 ENCOUNTER — Other Ambulatory Visit: Payer: Self-pay

## 2022-08-19 ENCOUNTER — Encounter (HOSPITAL_COMMUNITY): Payer: Self-pay | Admitting: Internal Medicine

## 2022-08-19 ENCOUNTER — Inpatient Hospital Stay (HOSPITAL_COMMUNITY): Payer: No Typology Code available for payment source | Admitting: Anesthesiology

## 2022-08-19 ENCOUNTER — Encounter (HOSPITAL_COMMUNITY)
Admission: EM | Disposition: A | Discharge status: Home / Self Care | Payer: Self-pay | Source: Home / Self Care | Attending: Family Medicine

## 2022-08-19 DIAGNOSIS — K5669 Other partial intestinal obstruction: Secondary | ICD-10-CM

## 2022-08-19 DIAGNOSIS — D500 Iron deficiency anemia secondary to blood loss (chronic): Secondary | ICD-10-CM

## 2022-08-19 DIAGNOSIS — R933 Abnormal findings on diagnostic imaging of other parts of digestive tract: Secondary | ICD-10-CM

## 2022-08-19 DIAGNOSIS — Z87891 Personal history of nicotine dependence: Secondary | ICD-10-CM

## 2022-08-19 DIAGNOSIS — R194 Change in bowel habit: Secondary | ICD-10-CM

## 2022-08-19 DIAGNOSIS — N189 Chronic kidney disease, unspecified: Secondary | ICD-10-CM

## 2022-08-19 DIAGNOSIS — I129 Hypertensive chronic kidney disease with stage 1 through stage 4 chronic kidney disease, or unspecified chronic kidney disease: Secondary | ICD-10-CM

## 2022-08-19 DIAGNOSIS — K6289 Other specified diseases of anus and rectum: Secondary | ICD-10-CM

## 2022-08-19 DIAGNOSIS — D374 Neoplasm of uncertain behavior of colon: Secondary | ICD-10-CM

## 2022-08-19 DIAGNOSIS — Z7901 Long term (current) use of anticoagulants: Secondary | ICD-10-CM

## 2022-08-19 HISTORY — PX: SUBMUCOSAL TATTOO INJECTION: SHX6856

## 2022-08-19 HISTORY — PX: FLEXIBLE SIGMOIDOSCOPY: SHX5431

## 2022-08-19 HISTORY — PX: BIOPSY: SHX5522

## 2022-08-19 LAB — RESPIRATORY PANEL BY PCR

## 2022-08-19 LAB — COMPREHENSIVE METABOLIC PANEL
ALT: 13 U/L (ref 0–44)
AST: 15 U/L (ref 15–41)
Albumin: 2.9 g/dL — ABNORMAL LOW (ref 3.5–5.0)
Alkaline Phosphatase: 60 U/L (ref 38–126)
Anion gap: 11 (ref 5–15)
BUN: 21 mg/dL (ref 8–23)
CO2: 20 mmol/L — ABNORMAL LOW (ref 22–32)
Calcium: 8.2 mg/dL — ABNORMAL LOW (ref 8.9–10.3)
Chloride: 108 mmol/L (ref 98–111)
Creatinine, Ser: 1.12 mg/dL (ref 0.61–1.24)
GFR, Estimated: >60 mL/min (ref >60)
Glucose, Bld: 104 mg/dL — ABNORMAL HIGH (ref 70–99)
Potassium: 3.8 mmol/L (ref 3.5–5.1)
Sodium: 139 mmol/L (ref 135–145)
Total Bilirubin: 1.1 mg/dL (ref 0.3–1.2)
Total Protein: 5.6 g/dL — ABNORMAL LOW (ref 6.5–8.1)

## 2022-08-19 LAB — LACTIC ACID, PLASMA: Lactic Acid, Venous: 0.8 mmol/L (ref 0.5–1.9)

## 2022-08-19 LAB — CBC
HCT: 31.4 % — ABNORMAL LOW (ref 39.0–52.0)
Hemoglobin: 10.6 g/dL — ABNORMAL LOW (ref 13.0–17.0)
MCH: 32.5 pg (ref 26.0–34.0)
MCHC: 33.8 g/dL (ref 30.0–36.0)
MCV: 96.3 fL (ref 80.0–100.0)
Platelets: 296 10*3/uL (ref 150–400)
RBC: 3.26 MIL/uL — ABNORMAL LOW (ref 4.22–5.81)
RDW: 12.6 % (ref 11.5–15.5)
WBC: 7.7 10*3/uL (ref 4.0–10.5)
nRBC: 0 % (ref 0.0–0.2)

## 2022-08-19 LAB — PROCALCITONIN: Procalcitonin: <0.1 ng/mL

## 2022-08-19 LAB — PREALBUMIN: Prealbumin: 10 mg/dL — ABNORMAL LOW (ref 18–38)

## 2022-08-19 LAB — HIV ANTIBODY (ROUTINE TESTING W REFLEX): HIV Screen 4th Generation wRfx: NONREACTIVE

## 2022-08-19 SURGERY — SIGMOIDOSCOPY, FLEXIBLE
Anesthesia: Monitor Anesthesia Care

## 2022-08-19 MED ORDER — SPOT INK MARKER SYRINGE KIT
PACK | SUBMUCOSAL | Status: AC
  Filled 2022-08-19: qty 5

## 2022-08-19 MED ORDER — ROPINIROLE HCL 1 MG PO TABS
0.5000 mg | ORAL_TABLET | Freq: Every day | ORAL | Status: DC
  Administered 2022-08-19 – 2022-08-20 (×2): 0.5 mg via ORAL
  Filled 2022-08-19 (×3): qty 1

## 2022-08-19 MED ORDER — ONDANSETRON HCL 4 MG/2ML IJ SOLN
4.0000 mg | Freq: Four times a day (QID) | INTRAMUSCULAR | Status: DC | PRN
  Administered 2022-08-19: 4 mg via INTRAVENOUS
  Filled 2022-08-19: qty 2

## 2022-08-19 MED ORDER — SPOT INK MARKER SYRINGE KIT
PACK | SUBMUCOSAL | Status: DC | PRN
  Administered 2022-08-19: 1 mL via SUBMUCOSAL

## 2022-08-19 MED ORDER — LACTATED RINGERS IV SOLN: INTRAVENOUS | Status: DC

## 2022-08-19 MED ORDER — PROPOFOL 500 MG/50ML IV EMUL
INTRAVENOUS | Status: DC | PRN
  Administered 2022-08-19: 125 ug/kg/min via INTRAVENOUS

## 2022-08-19 MED ORDER — ROPINIROLE HCL 1 MG PO TABS: 0.5000 mg | ORAL_TABLET | Freq: Every day | ORAL | Status: DC

## 2022-08-19 MED ORDER — SODIUM CHLORIDE 0.9 % IV SOLN: INTRAVENOUS | Status: DC

## 2022-08-19 MED ORDER — PROPOFOL 10 MG/ML IV BOLUS
INTRAVENOUS | Status: DC | PRN
  Administered 2022-08-19: 50 mg via INTRAVENOUS

## 2022-08-19 MED ORDER — POLYETHYLENE GLYCOL 3350 17 G PO PACK
17.0000 g | PACK | Freq: Every day | ORAL | Status: DC
  Administered 2022-08-19 – 2022-08-20 (×2): 17 g via ORAL
  Filled 2022-08-19 (×2): qty 1

## 2022-08-19 NOTE — Op Note (Addendum)
[facility] Patient Name: Scott [name] Procedure Date : 08/19/2022 MRN: [id number] Attending MD: Georgian Co , , [id number] Date of Birth: August 28, 1950 CSN: [id number] Age: 72 Admit Type: Inpatient Procedure:                Flexible Sigmoidoscopy Indications:              Abnormal CT of the GI tract Providers:                Adline Mango" Camillo Flaming, RN, William Dalton, Technician Referring MD:             Hospitalist team Medicines:                Monitored Anesthesia Care Complications:            No immediate complications. Estimated Blood Loss:     Estimated blood loss was minimal. Procedure:                Pre-Anesthesia Assessment:                           - Prior to the procedure, a History and Physical                            was performed, and patient medications and                            allergies were reviewed. The patient's tolerance of                            previous anesthesia was also reviewed. The risks                            and benefits of the procedure and the sedation                            options and risks were discussed with the patient.                            All questions were answered, and informed consent                            was obtained. Prior Anticoagulants: The patient has                            taken no anticoagulant or antiplatelet agents. ASA                            Grade Assessment: III - A patient with severe                            systemic disease. After reviewing the risks and  benefits, the patient was deemed in satisfactory                            condition to undergo the procedure.                           After obtaining informed consent, the scope was                            passed under direct vision. The [id number]                            [id number] Olympus peds colonoscope was introduced                             through the anus and advanced to the the sigmoid                            colon. The flexible sigmoidoscopy was accomplished                            without difficulty. The patient tolerated the                            procedure well. The [id number] Olympus                            endoscope was introduced through the anus and                            advanced to the the sigmoid colon. Scope In: 2:13:42 PM Scope Out: 2:30:01 PM Total Procedure Duration: 0 hours 16 minutes 19 seconds  Findings:      A large amount of stool was found in the sigmoid colon, interfering with       visualization.      A frond-like/villous, fungating and infiltrative partially obstructing       large mass was found in the rectum. The mass was circumferential. The       mass measured five cm in length. Oozing was present. This mass was able       to be bypassed with a regular upper endoscope but visualization proximal       to the mass was limited due to a large stool burden. Biopsies were taken       with a cold forceps for histology. Area distal to the mass was tattooed       with an injection of Spot (carbon black). Impression:               - Stool in the sigmoid colon.                           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 Biopsied. Tattooed. Recommendation:           -  Return patient to hospital ward for ongoing care.                           - Await pathology results.                           - Recommend colorectal surgery consult.                           - The findings and recommendations were discussed                            with the patient. Procedure Code(s):        --- Professional ---                           [phone], Sigmoidoscopy, flexible; with biopsy, single                            or multiple                           [id number], Sigmoidoscopy, flexible; with directed                             submucosal injection(s), any substance Diagnosis Code(s):        --- Professional ---                           D49.0, Neoplasm of unspecified behavior of                            digestive system                           K56.690, Other partial intestinal obstruction                           R93.3, Abnormal findings on diagnostic imaging of                            other parts of digestive tract CPT copyright 2022 American Medical Association. All rights reserved. The codes documented in this report are preliminary and upon coder review may  be revised to meet current compliance requirements. Dr Georgian Co [address] [place],  08/19/2022 2:58:08 PM Number of Addenda: 0

## 2022-08-19 NOTE — Progress Notes (Addendum)
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  Clinical details also in his admission history and physical.  For about 2 months he has had what he calls diarrhea, which is about 20 small and often liquid BMs per day with feelings of incomplete evacuation.  He has generalized abdominal discomfort.  There was some intermittent rectal bleeding for the last couple of months as well, but that has since stopped since he stopped his oral anticoagulation on his own about 10 days ago.  He had been trying to get medical attention at the [facility] for the symptoms, as he thought that may have something to do with changes in his cardiac meds months ago.  He was unable to get seen by [facility] provider and does not have a local PCP. He denies fever or chills, though he has had perhaps at least a 10 pound weight loss in the last several weeks. This patient has never had prior colorectal cancer screening.  He reports having been apprehensive about colonoscopy because he believes you had complications from that procedure.  Assessment & Plan:   Principal Problem:   Proctitis Active Problems:   Dissecting AAA (abdominal aortic aneurysm) ([place])   Essential hypertension   CAP (community acquired pneumonia)   Persistent atrial fibrillation ([name])   Large bowel obstruction ([name])   CKD (chronic kidney disease) stage 2, GFR 60-89 ml/min  Proctitis/large bowel obstruction/rectal mass: Continue Zosyn.  Seen by GI.  Needs sigmoidoscopy.  Management per GI.  History of dissecting AAA: S/p repair with stent placement.  Essential  hypertension: Controlled.  Continue home dose of labetalol.  Persistent atrial fibrillation: Patient stopped his Eliquis on his own due to GI bleeding, patient is going to get sigmoidoscopy and he will likely need biopsy so we will continue to hold Eliquis for now.  Waiting for timing of the sigmoidoscopy.  CKD stage IIIa: Stable.  Avoid nephrotoxic agents.  BPH: Continue Flomax.  Community-acquired pneumonia ruled out: He was admitted with presumed community-acquired pneumonia based on the CT findings but patient denies any respiratory symptoms, he is not hypoxic, procalcitonin is unremarkable as well so I do not think he has pneumonia.  DVT prophylaxis: SCDs Start: 08/18/22 2137   Code Status: Full Code  Family Communication:  None present at bedside.  Plan of care discussed with patient in length and he/she verbalized understanding and agreed with it.  Status is: Inpatient Remains inpatient appropriate because: Patient needs sigmoidoscopy.   Estimated body mass index is 25.61 kg/m as calculated from the following:   Height as of this encounter: '5\' 8"'$  (1.727 m).   Weight as of this encounter: 76.4 kg.    Nutritional Assessment: Body mass index is 25.61 kg/m.Marland Kitchen Seen by dietician.  I agree with the assessment and plan as outlined below: Nutrition Status:        . Skin Assessment: I have examined the patient's skin and I agree with the wound assessment as performed by the wound care RN as outlined below:    Consultants:  GI  Procedures:  As above  Antimicrobials:  Anti-infectives (From admission, onward)    Start  Dose/Rate Route Frequency Ordered Stop   08/19/22 0400  piperacillin-tazobactam (ZOSYN) IVPB 3.375 g        3.375 g 12.5 mL/hr over 240 Minutes Intravenous Every 8 hours 08/18/22 2014     08/18/22 2300  azithromycin (ZITHROMAX) 500 mg in sodium chloride 0.9 % 250 mL IVPB        500 mg 250 mL/hr over 60 Minutes Intravenous Every 24 hours 08/18/22 2250  08/23/22 2259   08/18/22 2030  piperacillin-tazobactam (ZOSYN) IVPB 3.375 g        3.375 g 100 mL/hr over 30 Minutes Intravenous  Once 08/18/22 2014 08/18/22 2111         Subjective: Patient seen and examined.  He states that he feels better, no abdominal pain.  No other complaint.  Objective: Vitals:   08/19/22 0100 08/19/22 0103 08/19/22 0533 08/19/22 0857  BP:  138/85 (!) 140/89 (!) 139/91  Pulse:  92 95 99  Resp:  '17 19 18  '$ Temp:  98 F (36.7 C) 98.7 F (37.1 C) 98.1 F (36.7 C)  TempSrc:  Oral Oral Oral  SpO2:  97% 99% 98%  Weight: 76.4 kg     Height: '5\' 8"'$  (1.727 m)       Intake/Output Summary (Last 24 hours) at 08/19/2022 0958 Last data filed at 08/19/2022 0300 Gross per 24 hour  Intake 384.22 ml  Output 0 ml  Net 384.22 ml   Filed Weights   08/18/22 1316 08/19/22 0100  Weight: 73.5 kg 76.4 kg    Examination:  General exam: Appears calm and comfortable  Respiratory system: Clear to auscultation. Respiratory effort normal. Cardiovascular system: S1 & S2 heard, RRR. No JVD, murmurs, rubs, gallops or clicks. No pedal edema. Gastrointestinal system: Abdomen is nondistended, soft and nontender. No organomegaly or masses felt. Normal bowel sounds heard. Central nervous system: Alert and oriented. No focal neurological deficits. Extremities: Symmetric 5 x 5 power. Skin: No rashes, lesions or ulcers Psychiatry: Judgement and insight appear normal. Mood & affect appropriate.    Data Reviewed: I have personally reviewed following labs and imaging studies  CBC: Recent Labs  Lab 08/18/22 1305 08/19/22 0328  WBC 12.7* 7.7  NEUTROABS 10.1*  --   HGB 12.6* 10.6*  HCT 37.0* 31.4*  MCV 96.1 96.3  PLT 364 0000000   Basic Metabolic Panel: Recent Labs  Lab 08/18/22 1305 08/18/22 2142 08/19/22 0328  NA 136  --  139  K 3.8  --  3.8  CL 104  --  108  CO2 20*  --  20*  GLUCOSE 112*  --  104*  BUN 22  --  21  CREATININE 1.34*  --  1.12  CALCIUM 9.1  --  8.2*   MG 2.4  --   --   PHOS  --  2.9  --    GFR: Estimated Creatinine Clearance: 56.8 mL/min (by C-G formula based on SCr of 1.12 mg/dL). Liver Function Tests: Recent Labs  Lab 08/18/22 1305 08/18/22 2142 08/19/22 0328  AST '22 25 15  '$ ALT '17 15 13  '$ ALKPHOS 71 68 60  BILITOT 1.1 1.2 1.1  PROT 6.7 6.9 5.6*  ALBUMIN 3.7 3.3* 2.9*   Recent Labs  Lab 08/18/22 1305  LIPASE 27   No results for input(s): "AMMONIA" in the last 168 hours. Coagulation Profile: No results for input(s): "INR", "PROTIME" in the last 168 hours. Cardiac Enzymes: Recent Labs  Lab 08/18/22 2142  CKTOTAL 119   BNP (last 3 results) No  results for input(s): "PROBNP" in the last 8760 hours. HbA1C: No results for input(s): "HGBA1C" in the last 72 hours. CBG: No results for input(s): "GLUCAP" in the last 168 hours. Lipid Profile: No results for input(s): "CHOL", "HDL", "LDLCALC", "TRIG", "CHOLHDL", "LDLDIRECT" in the last 72 hours. Thyroid Function Tests: Recent Labs    08/18/22 2142  TSH 1.833   Anemia Panel: Recent Labs    08/18/22 2142  VITAMINB12 514  FOLATE 32.1  FERRITIN 18*  TIBC 377  IRON 87  RETICCTPCT 1.0   Sepsis Labs: Recent Labs  Lab 08/18/22 2142 08/19/22 0328  PROCALCITON  --  <0.10  LATICACIDVEN 1.6 0.8    Recent Results (from the past 240 hour(s))  Resp panel by RT-PCR (RSV, Flu A&B, Covid) Anterior Nasal Swab     Status: None   Collection Time: 08/18/22 12:56 PM   Specimen: Anterior Nasal Swab  Result Value Ref Range Status   SARS Coronavirus 2 by RT PCR NEGATIVE NEGATIVE Final   Influenza A by PCR NEGATIVE NEGATIVE Final   Influenza B by PCR NEGATIVE NEGATIVE Final    Comment: (NOTE) The Xpert Xpress SARS-CoV-2/FLU/RSV plus assay is intended as an aid in the diagnosis of influenza from Nasopharyngeal swab specimens and should not be used as a sole basis for treatment. Nasal washings and aspirates are unacceptable for Xpert Xpress  SARS-CoV-2/FLU/RSV testing.  Fact Sheet for Patients: EntrepreneurPulse.com.au  Fact Sheet for Healthcare Providers: IncredibleEmployment.be  This test is not yet approved or cleared by the [place] FDA and has been authorized for detection and/or diagnosis of SARS-CoV-2 by FDA under an Emergency Use Authorization (EUA). This EUA will remain in effect (meaning this test can be used) for the duration of the COVID-19 declaration under Section 564(b)(1) of the Act, 21 U.S.C. section 360bbb-3(b)(1), unless the authorization is terminated or revoked.     Resp Syncytial Virus by PCR NEGATIVE NEGATIVE Final    Comment: (NOTE) Fact Sheet for Patients: EntrepreneurPulse.com.au  Fact Sheet for Healthcare Providers: IncredibleEmployment.be  This test is not yet approved or cleared by the [place] FDA and has been authorized for detection and/or diagnosis of SARS-CoV-2 by FDA under an Emergency Use Authorization (EUA). This EUA will remain in effect (meaning this test can be used) for the duration of the COVID-19 declaration under Section 564(b)(1) of the Act, 21 U.S.C. section 360bbb-3(b)(1), unless the authorization is terminated or revoked.  Performed at [facility], [place] [address], [place], [place] [address]   Respiratory (~20 pathogens) panel by PCR     Status: None   Collection Time: 08/18/22 10:51 PM   Specimen: Nasopharyngeal Swab; Respiratory  Result Value Ref Range Status   Adenovirus NOT DETECTED NOT DETECTED Final   Coronavirus 229E NOT DETECTED NOT DETECTED Final    Comment: (NOTE) The Coronavirus on the Respiratory Panel, DOES NOT test for the novel  Coronavirus (2019 nCoV)    Coronavirus HKU1 NOT DETECTED NOT DETECTED Final   Coronavirus NL63 NOT DETECTED NOT DETECTED Final   Coronavirus OC43 NOT DETECTED NOT DETECTED Final   Metapneumovirus NOT DETECTED NOT DETECTED Final    Rhinovirus / Enterovirus NOT DETECTED NOT DETECTED Final   Influenza A NOT DETECTED NOT DETECTED Final   Influenza B NOT DETECTED NOT DETECTED Final   Parainfluenza Virus 1 NOT DETECTED NOT DETECTED Final   Parainfluenza Virus 2 NOT DETECTED NOT DETECTED Final   Parainfluenza Virus 3 NOT DETECTED NOT DETECTED Final   Parainfluenza Virus 4 NOT  DETECTED NOT DETECTED Final   Respiratory Syncytial Virus NOT DETECTED NOT DETECTED Final   Bordetella pertussis NOT DETECTED NOT DETECTED Final   Bordetella Parapertussis NOT DETECTED NOT DETECTED Final   Chlamydophila pneumoniae NOT DETECTED NOT DETECTED Final   Mycoplasma pneumoniae NOT DETECTED NOT DETECTED Final    Comment: Performed at [facility], [place] [address] [place], [place] [address]     Radiology Studies: CT Angio Chest/Abd/Pel for Dissection W and/or Wo Contrast  Result Date: 08/18/2022 CLINICAL DATA:  Acute aortic syndrome, diarrhea. History of aortic dissection status post repair. EXAM: CT ANGIOGRAPHY CHEST, ABDOMEN AND PELVIS TECHNIQUE: Non-contrast CT of the chest was initially obtained. Multidetector CT imaging through the chest, abdomen and pelvis was performed using the standard protocol during bolus administration of intravenous contrast. Multiplanar reconstructed images and MIPs were obtained and reviewed to evaluate the vascular anatomy. RADIATION DOSE REDUCTION: This exam was performed according to the departmental dose-optimization program which includes automated exposure control, adjustment of the mA and/or kV according to patient size and/or use of iterative reconstruction technique. CONTRAST:  55m OMNIPAQUE IOHEXOL 350 MG/ML SOLN COMPARISON:  04/11/2022, 07/02/2020 FINDINGS: CTA CHEST FINDINGS Cardiovascular: The ascending aorta demonstrates stable dilation measuring 4.1 cm in greatest dimension. No intramural hematoma, dissection, or aneurysm. Endovascular stent graft repair of the descending thoracic aorta has been  performed with the proximal landing zone just distal to the left subclavian artery with overlapping stents extending to the diaphragmatic hiatus. No aneurysm or endoleak. Mild coronary artery calcification within the left anterior descending coronary artery. Global cardiac size is within normal limits. No pericardial effusion. Central pulmonary arteries are of normal caliber. No intraluminal filling defect identified through the segmental level to suggest acute pulmonary embolism. Mediastinum/Nodes: 19 mm right thyroid nodule, not well characterized on this examination. No pathologic thoracic adenopathy. Esophagus unremarkable. Lungs/Pleura: Subtle tree-in-bud peribronchial nodularity has developed within the right upper lobe, possibly infectious in the acute setting. No confluent pulmonary infiltrate. Bronchial wall thickening is again seen in keeping with airway inflammation. No central obstructing lesion. No pneumothorax or pleural effusion. Musculoskeletal: No acute bone abnormality. No lytic or blastic bone lesion. Review of the MIP images confirms the above findings. CTA ABDOMEN AND PELVIS FINDINGS VASCULAR Aorta: Normal caliber aorta without aneurysm, dissection, vasculitis or significant stenosis. Mild atherosclerotic calcification. Celiac: Variant anatomy with these celiac axis given rise only to the left gastric and splenic arteries. Widely patent. No aneurysm or dissection. SMA: Replaced common hepatic artery. Widely patent. No aneurysm or dissection. Renals: Single right renal artery demonstrates mild mixed atherosclerotic plaque within the mid segment resulting in a focal 50% stenosis in this location. Dual left renal arteries are widely patent. Normal vascular morphology. No aneurysm or dissection. IMA: Widely patent Inflow: Patent without evidence of aneurysm, dissection, vasculitis or significant stenosis. Veins: No obvious venous abnormality within the limitations of this arterial phase study.  Review of the MIP images confirms the above findings. NON-VASCULAR Hepatobiliary: Stable simple cortical cyst within the left hepatic lobe. No enhancing intrahepatic mass. No intra or extrahepatic biliary ductal dilation. Gallbladder unremarkable. Pancreas: Unremarkable. No pancreatic ductal dilatation or surrounding inflammatory changes. Spleen: Unremarkable Adrenals/Urinary Tract: Stable exophytic simple cyst arising from the lateral aspect of the interpolar region of the right kidney better characterized on prior examinations. No follow-up imaging is recommended for this lesion. The kidneys are otherwise unremarkable. Adrenal glands are unremarkable. Bladder unremarkable. Stomach/Bowel: There is asymmetric left lateral rectal wall thickening involving the low rectum, best  seen at axial image # 278/7 with punctate foci of extraluminal gas identified and extensive soft tissue infiltration within the left perirectal soft tissues. This may represent inflammatory changes secondary to traumatic or inflammatory ulceration/perforation or a rectal mass with transmural extension into the perirectal soft tissues. There is superimposed circumferential bowel wall thickening involving the rectosigmoid colon proximally in keeping with proctocolitis. There is moderate volume stool within the colon proximally in keeping with a partial large bowel obstruction. The stomach, small bowel, and large bowel are otherwise unremarkable. No free intraperitoneal gas or fluid. Lymphatic: No pathologic adenopathy within the abdomen and pelvis. Reproductive: The prostate gland is not enlarged. Seminal vesicles are unremarkable. Inflammatory changes involving the rectum abut the left posterior lobe of the prostate gland. Other: No abdominal wall hernia. Musculoskeletal: Degenerative changes are seen within the lumbar spine. No acute bone abnormality. No lytic or blastic bone lesion. Review of the MIP images confirms the above findings.  IMPRESSION: 1. Status post endovascular stent graft repair of the descending thoracic aorta. No aneurysm or endoleak. 2. Stable 4.1 cm ascending aortic aneurysm. Recommend annual imaging followup by CTA or MRA. This recommendation follows 2010 ACCF/AHA/AATS/ACR/ASA/SCA/SCAI/SIR/STS/SVM Guidelines for the Diagnosis and Management of Patients with Thoracic Aortic Disease. Circulation. 2010; [id number]. Aortic aneurysm NOS (ICD10-I71.9) 3. Mild coronary artery calcification. 4. 19 mm right thyroid nodule, not well characterized on this examination. 5. Interval development of mild tree-in-bud peribronchial nodularity within the right upper lobe, possibly infectious in the acute setting. 6. Asymmetric left lateral rectal wall thickening involving the low rectum with punctate foci of extraluminal gas and extensive soft tissue infiltration within the left perirectal soft tissues. This may represent inflammatory changes secondary to traumatic or inflammatory ulceration/perforation or a rectal mass with transmural extension into the perirectal soft tissues. Endoscopic correlation is recommended. Superimposed proximal proctocolitis and resultant partial large bowel obstruction with moderate colonic stool burden. Aortic Atherosclerosis (ICD10-I70.0). Electronically Signed   By: Fidela Salisbury M.D.   On: 08/18/2022 19:29    Scheduled Meds:  labetalol  100 mg Oral TID   rOPINIRole  0.5 mg Oral QHS   tamsulosin  0.4 mg Oral QPC supper   Continuous Infusions:  sodium chloride     azithromycin Stopped (08/19/22 0026)   piperacillin-tazobactam (ZOSYN)  IV       LOS: 1 day   Darliss Cheney, MD [facility]  08/19/2022, 9:58 AM   *Please note that this is a verbal dictation therefore any spelling or grammatical errors are due to the "[place] [employer]" system interpretation.  Please page via [place] and do not message via secure chat for urgent patient care matters. Secure chat can be used for non urgent  patient care matters.  How to contact the [facility] Attending or Consulting provider [place] or covering provider during after hours [place], for this patient?  Check the care team in [facility] and look for a) attending/consulting [facility] provider listed and b) the [facility] team listed. Page or secure chat [phone]. Log into [email or web address] and use [place]'s universal password to access. If you do not have the password, please contact the hospital operator. Locate the [facility] provider you are looking for under [facility] and page to a number that you can be directly reached. If you still have difficulty reaching the provider, please page the [facility] (Director on Call) for the Hospitalists listed on amion for assistance.

## 2022-08-19 NOTE — Consult Note (Signed)
[facility] Consult Note   History Earline Saleem MRN # [id number]  Date of Admission: 08/18/2022 Date of Consultation: 08/19/2022 Referring physician: Dr. Darliss Cheney, MD Primary Care Provider: [facility] Primary Gastroenterologist: None-unassigned   Reason for Consultation/Chief Complaint: Change in bowel habits, rectal bleeding, abnormal abdominal CT scan  Subjective  HPI:  This is a pleasant 72 year old man who came to the ED yesterday for worsening difficulty with altered bowel habits and general "sick feeling".  Clinical details also in his admission history and physical.  For about 2 months he has had what he calls diarrhea, which is about 20 small and often liquid BMs per day with feelings of incomplete evacuation.  He has generalized abdominal discomfort.  There was some intermittent rectal bleeding for the last couple of months as well, but that has since stopped since he stopped his oral anticoagulation of his own volition about 10 days ago.  He had been trying to get medical attention at the [facility] for the symptoms, as he thought that may have something to do with changes in his cardiac meds months ago.  He was unable to get seen by his [facility] primary care provider and does not have a local PCP. He denies fever or chills, though he has had perhaps at least a 10 pound weight loss in the last several weeks. This patient has never had prior colorectal cancer screening.  He reports having been apprehensive about colonoscopy because he believes you had complications from that procedure. 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    Past Surgical History Past Surgical History:   Procedure Laterality Date   ABDOMINAL AORTIC ENDOVASCULAR STENT GRAFT  02/02/2020    type b   APPENDECTOMY     THORACIC AORTIC ENDOVASCULAR STENT GRAFT N/A 02/02/2020   Procedure: REPAIR TYPE B THORACIC AORTIC ENDOVASCULAR STENT;  Surgeon: [name], MD;  Location: [facility] OR;  Service: Vascular;  Laterality: N/A;    Family History Family History  Problem Relation Age of Onset   Lung cancer Sister     Social History Social History   Socioeconomic History   Marital status: Married    Spouse name: Not on file   Number of children: 3   Years of education: Not on file   Highest education level: Not on file  Occupational History   Occupation: veteran  Tobacco Use   Smoking status: Former   Smokeless tobacco: Never   Tobacco comments:    Quit 2021  Vaping Use   Vaping Use: Never used  Substance and Sexual Activity   Alcohol use: Yes    Alcohol/week: 1.0 - 2.0 standard drink of alcohol    Types: 1 - 2 Cans of beer per week    Comment: 1-2 beers 1-2 times a week 08/26/21   Drug use: Yes    Types: Marijuana   Sexual activity: Not on file  Other Topics Concern   Not on file  Social History Narrative   Not on file   Social Determinants of Health   Financial Resource Strain: Not on file  Food Insecurity: No Food Insecurity (07/09/2020)   Hunger Vital Sign    Worried About Estate manager/land agent of Food  in the Last Year: Never true    [place] in the Last Year: Never true  Transportation Needs: No Transportation Needs (07/09/2020)   PRAPARE - Hydrologist (Medical): No    Lack of Transportation (Non-Medical): No  Physical Activity: Not on file  Stress: Not on file  Social Connections: Unknown (07/09/2020)   Social Connection and Isolation Panel [NHANES]    Frequency of Communication with Friends and Family: More than three times a week    Frequency of Social Gatherings with Friends and Family: More than three times a week    Attends Religious  Services: 1 to 4 times per year    Active Member of [employer] or Organizations: No    Attends Music therapist: 1 to 4 times per year    Marital Status: Patient refused    Allergies Allergies  Allergen Reactions   Carvedilol Other (See Comments)    Night terrors   Amlodipine Itching   Hydralazine     Night terrors, fatigued   Labetalol Itching    No redness - tingling/itching. Takes '100mg'$  twice daily     Valsartan     Night terrors and fatigued    Outpatient Meds Home medications from the H+P and/or nursing med reconciliation reviewed.  Inpatient med list reviewed  _____________________________________________________________________ Objective   Exam:  Current vital signs  Patient Vitals for the past 8 hrs:  BP Temp Temp src Pulse Resp SpO2 Height Weight  08/19/22 0533 (!) 140/89 98.7 F (37.1 C) Oral 95 19 99 % -- --  08/19/22 0103 138/85 98 F (36.7 C) Oral 92 17 97 % -- --  08/19/22 0100 -- -- -- -- -- -- '5\' 8"'$  (1.727 m) 76.4 kg  08/19/22 0026 -- 98.6 F (37 C) Oral -- -- -- -- --  08/19/22 0025 (!) 150/89 -- -- 91 17 99 % -- --  08/18/22 2345 (!) 129/96 -- -- 98 (!) 21 100 % -- --    Intake/Output Summary (Last 24 hours) at 08/19/2022 0734 Last data filed at 08/19/2022 0300 Gross per 24 hour  Intake 384.22 ml  Output 0 ml  Net 384.22 ml    Physical Exam:  His daughter [name] was at the bedside in the entire visit. General: this is a pleasant elderly male patient in no acute distress Eyes: sclera anicteric, no redness ENT: oral mucosa moist without lesions, no cervical or supraclavicular lymphadenopathy CV: Irregular without appreciable murmur, S1/S2, no JVD,, no peripheral edema.  Good distal pulses Resp: clear to auscultation bilaterally, normal RR and effort noted GI: soft, no focal tenderness, with active bowel sounds. No guarding or palpable organomegaly noted.  Slight tympany, no distention Skin; warm and dry, no rash or jaundice  noted Neuro: awake, alert and oriented x 3. Normal gross motor function and fluent speech.  Labs:     Latest Ref Rng & Units 08/19/2022    3:28 AM 08/18/2022    1:05 PM 04/11/2022    6:13 PM  CBC  WBC 4.0 - 10.5 K/uL 7.7  12.7  7.0   Hemoglobin 13.0 - 17.0 g/dL 10.6  12.6  14.1   Hematocrit 39.0 - 52.0 % 31.4  37.0  40.1   Platelets 150 - 400 K/uL 296  364  186        Latest Ref Rng & Units 08/19/2022    3:28 AM 08/18/2022    9:42 PM 08/18/2022    1:05 PM  CMP  Glucose 70 - 99 mg/dL 104   112   BUN 8 - 23 mg/dL 21   22   Creatinine 0.61 - 1.24 mg/dL 1.12   1.34   Sodium 135 - 145 mmol/L 139   136   Potassium 3.5 - 5.1 mmol/L 3.8   3.8   Chloride 98 - 111 mmol/L 108   104   CO2 22 - 32 mmol/L 20   20   Calcium 8.9 - 10.3 mg/dL 8.2   9.1   Total Protein 6.5 - 8.1 g/dL 5.6  6.9  6.7   Total Bilirubin 0.3 - 1.2 mg/dL 1.1  1.2  1.1   Alkaline Phos 38 - 126 U/L 60  68  71   AST 15 - 41 U/L '15  25  22   '$ ALT 0 - 44 U/L '13  15  17     '$ No results for input(s): "INR" in the last 168 hours. _________________________________________________________ Radiologic studies:  CLINICAL DATA:  Acute aortic syndrome, diarrhea. History of aortic dissection status post repair.   EXAM: CT ANGIOGRAPHY CHEST, ABDOMEN AND PELVIS   TECHNIQUE: Non-contrast CT of the chest was initially obtained.   Multidetector CT imaging through the chest, abdomen and pelvis was performed using the standard protocol during bolus administration of intravenous contrast. Multiplanar reconstructed images and MIPs were obtained and reviewed to evaluate the vascular anatomy.   RADIATION DOSE REDUCTION: This exam was performed according to the departmental dose-optimization program which includes automated exposure control, adjustment of the mA and/or kV according to patient size and/or use of iterative reconstruction technique.   CONTRAST:  80m OMNIPAQUE IOHEXOL 350 MG/ML SOLN   COMPARISON:  04/11/2022,  07/02/2020   FINDINGS: CTA CHEST FINDINGS   Cardiovascular: The ascending aorta demonstrates stable dilation measuring 4.1 cm in greatest dimension. No intramural hematoma, dissection, or aneurysm. Endovascular stent graft repair of the descending thoracic aorta has been performed with the proximal landing zone just distal to the left subclavian artery with overlapping stents extending to the diaphragmatic hiatus. No aneurysm or endoleak.   Mild coronary artery calcification within the left anterior descending coronary artery. Global cardiac size is within normal limits. No pericardial effusion. Central pulmonary arteries are of normal caliber. No intraluminal filling defect identified through the segmental level to suggest acute pulmonary embolism.   Mediastinum/Nodes: 19 mm right thyroid nodule, not well characterized on this examination. No pathologic thoracic adenopathy. Esophagus unremarkable.   Lungs/Pleura: Subtle tree-in-bud peribronchial nodularity has developed within the right upper lobe, possibly infectious in the acute setting. No confluent pulmonary infiltrate. Bronchial wall thickening is again seen in keeping with airway inflammation. No central obstructing lesion. No pneumothorax or pleural effusion.   Musculoskeletal: No acute bone abnormality. No lytic or blastic bone lesion.   Review of the MIP images confirms the above findings.   CTA ABDOMEN AND PELVIS FINDINGS   VASCULAR   Aorta: Normal caliber aorta without aneurysm, dissection, vasculitis or significant stenosis. Mild atherosclerotic calcification.   Celiac: Variant anatomy with these celiac axis given rise only to the left gastric and splenic arteries. Widely patent. No aneurysm or dissection.   SMA: Replaced common hepatic artery. Widely patent. No aneurysm or dissection.   Renals: Single right renal artery demonstrates mild mixed atherosclerotic plaque within the mid segment resulting in a  focal 50% stenosis in this location. Dual left renal arteries are widely patent. Normal vascular morphology. No aneurysm or dissection.   IMA: Widely patent   Inflow:  Patent without evidence of aneurysm, dissection, vasculitis or significant stenosis.   Veins: No obvious venous abnormality within the limitations of this arterial phase study.   Review of the MIP images confirms the above findings.   NON-VASCULAR   Hepatobiliary: Stable simple cortical cyst within the left hepatic lobe. No enhancing intrahepatic mass. No intra or extrahepatic biliary ductal dilation. Gallbladder unremarkable.   Pancreas: Unremarkable. No pancreatic ductal dilatation or surrounding inflammatory changes.   Spleen: Unremarkable   Adrenals/Urinary Tract: Stable exophytic simple cyst arising from the lateral aspect of the interpolar region of the right kidney better characterized on prior examinations. No follow-up imaging is recommended for this lesion. The kidneys are otherwise unremarkable. Adrenal glands are unremarkable. Bladder unremarkable.   Stomach/Bowel: There is asymmetric left lateral rectal wall thickening involving the low rectum, best seen at axial image # 278/7 with punctate foci of extraluminal gas identified and extensive soft tissue infiltration within the left perirectal soft tissues. This may represent inflammatory changes secondary to traumatic or inflammatory ulceration/perforation or a rectal mass with transmural extension into the perirectal soft tissues. There is superimposed circumferential bowel wall thickening involving the rectosigmoid colon proximally in keeping with proctocolitis. There is moderate volume stool within the colon proximally in keeping with a partial large bowel obstruction.   The stomach, small bowel, and large bowel are otherwise unremarkable. No free intraperitoneal gas or fluid.   Lymphatic: No pathologic adenopathy within the abdomen and  pelvis.   Reproductive: The prostate gland is not enlarged. Seminal vesicles are unremarkable. Inflammatory changes involving the rectum abut the left posterior lobe of the prostate gland.   Other: No abdominal wall hernia.   Musculoskeletal: Degenerative changes are seen within the lumbar spine. No acute bone abnormality. No lytic or blastic bone lesion.   Review of the MIP images confirms the above findings.   IMPRESSION: 1. Status post endovascular stent graft repair of the descending thoracic aorta. No aneurysm or endoleak. 2. Stable 4.1 cm ascending aortic aneurysm. Recommend annual imaging followup by CTA or MRA. This recommendation follows 2010 ACCF/AHA/AATS/ACR/ASA/SCA/SCAI/SIR/STS/SVM Guidelines for the Diagnosis and Management of Patients with Thoracic Aortic Disease. Circulation. 2010; [id number]. Aortic aneurysm NOS (ICD10-I71.9) 3. Mild coronary artery calcification. 4. 19 mm right thyroid nodule, not well characterized on this examination. 5. Interval development of mild tree-in-bud peribronchial nodularity within the right upper lobe, possibly infectious in the acute setting. 6. Asymmetric left lateral rectal wall thickening involving the low rectum with punctate foci of extraluminal gas and extensive soft tissue infiltration within the left perirectal soft tissues. This may represent inflammatory changes secondary to traumatic or inflammatory ulceration/perforation or a rectal mass with transmural extension into the perirectal soft tissues. Endoscopic correlation is recommended. Superimposed proximal proctocolitis and resultant partial large bowel obstruction with moderate colonic stool burden.   Aortic Atherosclerosis (ICD10-I70.0).     Electronically Signed   By: Fidela Salisbury M.D.   On: 08/18/2022 19:29   (I personally reviewed the CT images-HD)  __________________  There is also CT angiogram of the chest from 04/11/2022 on  file ______________________________________________________ Other studies:   _______________________________________________________ Assessment & Plan  Impression: Months of a change in bowel habits from overflow due to high-grade rectosigmoid obstruction  Anemia perhaps multifactorial but at least partially from recent GI blood loss.   Atrial fibrillation, typically on oral anticoagulation until patient stopped it about 10 days ago.  Abnormal imaging of GI tract-this is highly suspicious for an obstructing rectosigmoid mass.  It is almost  certainly extending into the pelvis accounting for the radiologic description.  The left colon is mildly distended with a considerable amount of retained stool.  The colon more proximal to that is not dilated, and his exam is reassuring.  He was started on Zosyn last evening due to the radiologic description of a punctate foci of extraluminal gas.  However, I suspect that is related to local extension of his tumor rather than active infection at present.  I recommend continuing the Zosyn at least until a sigmoidoscopy can be performed.  The patient does have an endovascular graft that would be at risk for infection.   Plan: This patient needs a sigmoidoscopy.  Dr. Lorenso Courier is our hospital consult physician this week, and I have messaged her about this patient.  I do not know if the endoscopy schedule will allow the procedure today, but the patient has been kept n.p.o. for now. He would need a tapwater enema prep, which will be arranged once our primary consult team can coordinate with the endoscopy department.  I explained at length my clinical suspicion and both Mr. Sicotte and his daughter understand what we believed to be happening.  He is agreeable to the sigmoidoscopy, full risks and benefits will be reviewed with him by the performing physician.  Thank you for the courtesy of this consult.  Please contact our consult team with any questions or  concerns.  Nelida Meuse III Office: [phone]

## 2022-08-19 NOTE — Interval H&P Note (Signed)
History and Physical Interval Note:  08/19/2022 1:54 PM  Scott [name]  has presented today for surgery, with the diagnosis of Rectal thickening on CT.  The various methods of treatment have been discussed with the patient and family. After consideration of risks, benefits and other options for treatment, the patient has consented to  Procedure(s): FLEXIBLE SIGMOIDOSCOPY (N/A) as a surgical intervention.  The patient's history has been reviewed, patient examined, no change in status, stable for surgery.  I have reviewed the patient's chart and labs.  Questions were answered to the patient's satisfaction.     Sharyn Creamer

## 2022-08-19 NOTE — TOC Initial Note (Signed)
Transition of Care [facility]) - Initial/Assessment Note    Patient Details  Name: Scott [name] MRN: [id number] Date of Birth: 1950/12/09  Transition [facility]) CM/SW Contact:    Tom-Johnson, Renea Ee, RN Phone Number: 08/19/2022, 3:54 PM  Clinical Narrative:                  CM spoke with patient and wife, Scott [name] at bedside about needs for post hospital transition.  Admitted for Proctitis, on IV abx. GI following. Had Sigmoidoscopy today for Rectal thickening. Patient on Eliquis for A-fib at home, states he stopped taking 10 days prior to admit.   From home with wife, has three supportive children. Retired from the [name]. Has a cane, walker and shower seat at home.  PCP is Dr. Gilford Rile at the [facility] and uses the [facility] pharmacy. Patient states he tried to reach the [name] team when his symptoms started a couple months ago with no success and had a scheduled appointment the day of admission but cancelled and came to the ED.  [facility] called [facility] hot[facility] and spoke with [facility]. Referral # is [id number]. CM notified Shawna of patient's concern of not being able to reach someone. [place] states she will notify team of patient's concern.  No TOC needs or recommendations noted at this time. CM will continue to follow as patient progresses towards discharge.          Expected Discharge Plan: Home/Self Care Barriers to Discharge: Continued Medical Work up   Patient Goals and CMS Choice Patient states their goals for this hospitalization and ongoing recovery are:: To return home CMS Medicare.gov Compare Post Acute Care list provided to:: Patient Choice offered to / list presented to : Patient, Spouse      Expected Discharge Plan and Services   Discharge Planning Services: CM Consult Post Acute Care Choice: NA Living arrangements for the past 2 months: Single Family Home                 DME Arranged: N/A DME Agency: NA       HH Arranged: NA HH Agency: NA         Prior Living Arrangements/Services Living arrangements for the past 2 months: Single Family Home Lives with:: Spouse Patient language and need for interpreter reviewed:: Yes Do you feel safe going back to the place where you live?: Yes      Need for Family Participation in Patient Care: Yes (Comment) Care giver support system in place?: Yes (comment) Current home services: DME (Cane, walker, shower  seat) Criminal Activity/Legal Involvement Pertinent to Current Situation/Hospitalization: No - Comment as needed  Activities of Daily Living      Permission Sought/Granted Permission sought to share information with : Case Manager, Family Supports Permission granted to share information with : Yes, Verbal Permission Granted              Emotional Assessment Appearance:: Appears stated age Attitude/Demeanor/Rapport: Engaged, Gracious Affect (typically observed): Accepting, Calm, Hopeful, Pleasant Orientation: : Oriented to Self, Oriented to Place, Oriented to  Time, Oriented to Situation Alcohol / Substance Use: Not Applicable Psych Involvement: No (comment)  Admission diagnosis:  Proctitis [K62.89] Rectal mass [K62.89] Partial intestinal obstruction, unspecified cause ([place]) [K56.600] Patient Active Problem List   Diagnosis Date Noted   Proctitis 08/18/2022   Large bowel obstruction ([place]) 08/18/2022   CKD (chronic kidney disease) stage 2, GFR 60-89 ml/min 08/18/2022   Secondary hypercoagulable state ([place]) 08/26/2021  Acute respiratory failure with hypoxia ([name])    Hypokalemia    Persistent atrial fibrillation ([place])    CAP (community acquired pneumonia) 08/12/2021   Essential hypertension 02/14/2020   Tobacco abuse, 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 [place] [address] Phone: [phone] Fax: [phone]     Social Determinants of Health (SDOH) Social History: [place]: No Food Insecurity (07/09/2020)  Housing: Low Risk  (07/09/2020)  Transportation Needs: No Transportation Needs (07/09/2020)  Depression (PHQ2-9): Low Risk  (08/27/2021)  Social Connections: Unknown (07/09/2020)  Tobacco Use: Medium Risk (08/19/2022)   SDOH Interventions: Transportation Interventions: Intervention Not Indicated, Inpatient TOC, Patient Resources (Friends/Family)   Readmission Risk Interventions     No data to display

## 2022-08-19 NOTE — Progress Notes (Signed)
Initial Nutrition Assessment  DOCUMENTATION CODES:   Not applicable  INTERVENTION:  - Add Ensure Enlive po BID, each supplement provides 350 kcal and 20 grams of protein. (Once diet advances)   - Add MVI q day.   NUTRITION DIAGNOSIS:   Inadequate oral intake related to altered GI function as evidenced by NPO status.  GOAL:   Patient will meet greater than or equal to 90% of their needs  MONITOR:   Diet advancement  REASON FOR ASSESSMENT:   Consult Assessment of nutrition requirement/status  ASSESSMENT:   72 y.o. male admits related to abdominal pain and diarrhea. PMH includes: dissection of abdominal aorta, HTN, tobacco use. Pt is currently receiving medical management related to Proctitis.  Meds reviewed. Labs reviewed.   The pt was out of room at time of assessment. Pt remains NPO at this time. Unable to gather nutrition hx details at this time. RD will add supplements once diet is advanced. Will continue to closely monitor diet advancement and PO intakes.   NUTRITION - FOCUSED PHYSICAL EXAM:  Unable to assess - attempt at f/u.   Diet Order:   Diet Order             Diet NPO time specified  Diet effective now                   EDUCATION NEEDS:   Not appropriate for education at this time  Skin:  Skin Assessment: Reviewed RN Assessment  Last BM:  2/29  Height:   Ht Readings from Last 1 Encounters:  08/19/22 '5\' 8"'$  (1.727 m)    Weight:   Wt Readings from Last 1 Encounters:  08/19/22 76.4 kg    Ideal Body Weight:     BMI:  Body mass index is 25.61 kg/m.  Estimated Nutritional Needs:   Kcal:  [id number] kcals  Protein:  95-115 gm  Fluid:  >/= 1.9 L  Thalia Bloodgood, RD, LDN, CNSC.

## 2022-08-19 NOTE — Transfer of Care (Signed)
Immediate Anesthesia Transfer of Care Note  Patient: Scott [name]  Procedure(s) Performed: [place] TATTOO INJECTION  Patient Location: PACU  Anesthesia Type:MAC  Level of Consciousness: drowsy and patient cooperative  Airway & Oxygen Therapy: Patient Spontanous Breathing  Post-op Assessment: Report given to RN and Post -op Vital signs reviewed and stable  Post vital signs: Reviewed and stable  Last Vitals:  Vitals Value Taken Time  BP 126/62 08/19/22 1437  Temp    Pulse 78 08/19/22 1438  Resp 15 08/19/22 1438  SpO2 100 % 08/19/22 1438  Vitals shown include unvalidated device data.  Last Pain:  Vitals:   08/19/22 1326  TempSrc:   PainSc: 0-No pain         Complications: No notable events documented.

## 2022-08-19 NOTE — H&P (View-Only) (Signed)
Scott [name]   History Scott [name] MRN # [id number]  Date of Admission: 08/18/2022 Date of Consultation: 08/19/2022 Referring physician: Dr. Darliss Cheney, MD Primary Care Provider: [name] Primary Gastroenterologist: None-unassigned   Reason for Consultation/Chief Complaint: Change in bowel habits, rectal bleeding, abnormal abdominal CT scan  Subjective  HPI:  This is a pleasant 72 year old man who came to the ED yesterday for worsening difficulty with altered bowel habits and general "sick feeling".  Clinical details also in his admission history and physical.  For about 2 months he has had what he calls diarrhea, which is about 20 small and often liquid BMs per day with feelings of incomplete evacuation.  He has generalized abdominal discomfort.  There was some intermittent rectal bleeding for the last couple of months as well, but that has since stopped since he stopped his oral anticoagulation of his own volition about 10 days ago.  He had been trying to get medical attention at the [facility] for the symptoms, as he thought that may have something to do with changes in his cardiac meds months ago.  He was unable to get seen by [facility] and does not have a local PCP. He denies fever or chills, though he has had perhaps at least a 10 pound weight loss in the last several weeks. This patient has never had prior colorectal cancer screening.  He reports having been apprehensive about colonoscopy because he believes you had complications from that procedure. 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    Past Surgical History Past Surgical History:   Procedure Laterality Date   ABDOMINAL AORTIC ENDOVASCULAR STENT GRAFT  02/02/2020    type b   APPENDECTOMY     THORACIC AORTIC ENDOVASCULAR STENT GRAFT N/A 02/02/2020   Procedure: REPAIR TYPE B THORACIC AORTIC ENDOVASCULAR STENT;  Surgeon: Scott Dutch, MD;  Location: [facility] OR;  Service: Vascular;  Laterality: N/A;    Family History Family History  Problem Relation Age of Onset   Lung cancer Sister     Social History Social History   Socioeconomic History   Marital status: Married    Spouse name: Not on file   Number of children: 3   Years of education: Not on file   Highest education level: Not on file  Occupational History   Occupation: veteran  Tobacco Use   Smoking status: Former   Smokeless tobacco: Never   Tobacco comments:    Quit 2021  Vaping Use   Vaping Use: Never used  Substance and Sexual Activity   Alcohol use: Yes    Alcohol/week: 1.0 - 2.0 standard drink of alcohol    Types: 1 - 2 Cans of beer per week    Comment: 1-2 beers 1-2 times a week 08/26/21   Drug use: Yes    Types: Marijuana   Sexual activity: Not on file  Other Topics Concern   Not on file  Social History Narrative   Not on file   Social Determinants of Health   Financial Resource Strain: Not on file  Food Insecurity: No Food Insecurity (07/09/2020)   Hunger Vital Sign    Worried About Estate manager/land agent of Food  in the Last Year: Never true    [place] in the Last Year: Never true  Transportation Needs: No Transportation Needs (07/09/2020)   PRAPARE - Hydrologist (Medical): No    Lack of Transportation (Non-Medical): No  Physical Activity: Not on file  Stress: Not on file  Social Connections: Unknown (07/09/2020)   Social Connection and Isolation Panel [NHANES]    Frequency of Communication with Friends and Family: More than three times a week    Frequency of Social Gatherings with Friends and Family: More than three times a week    Attends Religious  Services: 1 to 4 times per year    Active Member of [employer] or Organizations: No    Attends Music therapist: 1 to 4 times per year    Marital Status: Patient refused    Allergies Allergies  Allergen Reactions   Carvedilol Other (See Comments)    Night terrors   Amlodipine Itching   Hydralazine     Night terrors, fatigued   Labetalol Itching    No redness - tingling/itching. Takes '100mg'$  twice daily     Valsartan     Night terrors and fatigued    Outpatient Meds Home medications from the H+P and/or nursing med reconciliation reviewed.  Inpatient med list reviewed  _____________________________________________________________________ Objective   Exam:  Current vital signs  Patient Vitals for the past 8 hrs:  BP Temp Temp src Pulse Resp SpO2 Height Weight  08/19/22 0533 (!) 140/89 98.7 F (37.1 C) Oral 95 19 99 % -- --  08/19/22 0103 138/85 98 F (36.7 C) Oral 92 17 97 % -- --  08/19/22 0100 -- -- -- -- -- -- '5\' 8"'$  (1.727 m) 76.4 kg  08/19/22 0026 -- 98.6 F (37 C) Oral -- -- -- -- --  08/19/22 0025 (!) 150/89 -- -- 91 17 99 % -- --  08/18/22 2345 (!) 129/96 -- -- 98 (!) 21 100 % -- --    Intake/Output Summary (Last 24 hours) at 08/19/2022 0734 Last data filed at 08/19/2022 0300 Gross per 24 hour  Intake 384.22 ml  Output 0 ml  Net 384.22 ml    Physical Exam:  His daughter Scott [name] was at the bedside in the entire visit. General: this is a pleasant elderly male patient in no acute distress Eyes: sclera anicteric, no redness ENT: oral mucosa moist without lesions, no cervical or supraclavicular lymphadenopathy CV: Irregular without appreciable murmur, S1/S2, no JVD,, no peripheral edema.  Good distal pulses Resp: clear to auscultation bilaterally, normal RR and effort noted GI: soft, no focal tenderness, with active bowel sounds. No guarding or palpable organomegaly noted.  Slight tympany, no distention Skin; warm and dry, no rash or jaundice  noted Neuro: awake, alert and oriented x 3. Normal gross motor function and fluent speech.  Labs:     Latest Ref Rng & Units 08/19/2022    3:28 AM 08/18/2022    1:05 PM 04/11/2022    6:13 PM  CBC  WBC 4.0 - 10.5 K/uL 7.7  12.7  7.0   Hemoglobin 13.0 - 17.0 g/dL 10.6  12.6  14.1   Hematocrit 39.0 - 52.0 % 31.4  37.0  40.1   Platelets 150 - 400 K/uL 296  364  186        Latest Ref Rng & Units 08/19/2022    3:28 AM 08/18/2022    9:42 PM 08/18/2022    1:05 PM  CMP  Glucose 70 - 99 mg/dL 104   112   BUN 8 - 23 mg/dL 21   22   Creatinine 0.61 - 1.24 mg/dL 1.12   1.34   Sodium 135 - 145 mmol/L 139   136   Potassium 3.5 - 5.1 mmol/L 3.8   3.8   Chloride 98 - 111 mmol/L 108   104   CO2 22 - 32 mmol/L 20   20   Calcium 8.9 - 10.3 mg/dL 8.2   9.1   Total Protein 6.5 - 8.1 g/dL 5.6  6.9  6.7   Total Bilirubin 0.3 - 1.2 mg/dL 1.1  1.2  1.1   Alkaline Phos 38 - 126 U/L 60  68  71   AST 15 - 41 U/L '15  25  22   '$ ALT 0 - 44 U/L '13  15  17     '$ No results for input(s): "INR" in the last 168 hours. _________________________________________________________ Radiologic studies:  CLINICAL DATA:  Acute aortic syndrome, diarrhea. History of aortic dissection status post repair.   EXAM: CT ANGIOGRAPHY CHEST, ABDOMEN AND PELVIS   TECHNIQUE: Non-contrast CT of the chest was initially obtained.   Multidetector CT imaging through the chest, abdomen and pelvis was performed using the standard protocol during bolus administration of intravenous contrast. Multiplanar reconstructed images and MIPs were obtained and reviewed to evaluate the vascular anatomy.   RADIATION DOSE REDUCTION: This exam was performed according to the departmental dose-optimization program which includes automated exposure control, adjustment of the mA and/or kV according to patient size and/or use of iterative reconstruction technique.   CONTRAST:  1m OMNIPAQUE IOHEXOL 350 MG/ML SOLN   COMPARISON:  04/11/2022,  07/02/2020   FINDINGS: CTA CHEST FINDINGS   Cardiovascular: The ascending aorta demonstrates stable dilation measuring 4.1 cm in greatest dimension. No intramural hematoma, dissection, or aneurysm. Endovascular stent graft repair of the descending thoracic aorta has been performed with the proximal landing zone just distal to the left subclavian artery with overlapping stents extending to the diaphragmatic hiatus. No aneurysm or endoleak.   Mild coronary artery calcification within the left anterior descending coronary artery. Global cardiac size is within normal limits. No pericardial effusion. Central pulmonary arteries are of normal caliber. No intraluminal filling defect identified through the segmental level to suggest acute pulmonary embolism.   Mediastinum/Nodes: 19 mm right thyroid nodule, not well characterized on this examination. No pathologic thoracic adenopathy. Esophagus unremarkable.   Lungs/Pleura: Subtle tree-in-bud peribronchial nodularity has developed within the right upper lobe, possibly infectious in the acute setting. No confluent pulmonary infiltrate. Bronchial wall thickening is again seen in keeping with airway inflammation. No central obstructing lesion. No pneumothorax or pleural effusion.   Musculoskeletal: No acute bone abnormality. No lytic or blastic bone lesion.   Review of the MIP images confirms the above findings.   CTA ABDOMEN AND PELVIS FINDINGS   VASCULAR   Aorta: Normal caliber aorta without aneurysm, dissection, vasculitis or significant stenosis. Mild atherosclerotic calcification.   Celiac: Variant anatomy with these celiac axis given rise only to the left gastric and splenic arteries. Widely patent. No aneurysm or dissection.   SMA: Replaced common hepatic artery. Widely patent. No aneurysm or dissection.   Renals: Single right renal artery demonstrates mild mixed atherosclerotic plaque within the mid segment resulting in a  focal 50% stenosis in this location. Dual left renal arteries are widely patent. Normal vascular morphology. No aneurysm or dissection.   IMA: Widely patent   Inflow:  Patent without evidence of aneurysm, dissection, vasculitis or significant stenosis.   Veins: No obvious venous abnormality within the limitations of this arterial phase study.   Review of the MIP images confirms the above findings.   NON-VASCULAR   Hepatobiliary: Stable simple cortical cyst within the left hepatic lobe. No enhancing intrahepatic mass. No intra or extrahepatic biliary ductal dilation. Gallbladder unremarkable.   Pancreas: Unremarkable. No pancreatic ductal dilatation or surrounding inflammatory changes.   Spleen: Unremarkable   Adrenals/Urinary Tract: Stable exophytic simple cyst arising from the lateral aspect of the interpolar region of the right kidney better characterized on prior examinations. No follow-up imaging is recommended for this lesion. The kidneys are otherwise unremarkable. Adrenal glands are unremarkable. Bladder unremarkable.   Stomach/Bowel: There is asymmetric left lateral rectal wall thickening involving the low rectum, best seen at axial image # 278/7 with punctate foci of extraluminal gas identified and extensive soft tissue infiltration within the left perirectal soft tissues. This may represent inflammatory changes secondary to traumatic or inflammatory ulceration/perforation or a rectal mass with transmural extension into the perirectal soft tissues. There is superimposed circumferential bowel wall thickening involving the rectosigmoid colon proximally in keeping with proctocolitis. There is moderate volume stool within the colon proximally in keeping with a partial large bowel obstruction.   The stomach, small bowel, and large bowel are otherwise unremarkable. No free intraperitoneal gas or fluid.   Lymphatic: No pathologic adenopathy within the abdomen and  pelvis.   Reproductive: The prostate gland is not enlarged. Seminal vesicles are unremarkable. Inflammatory changes involving the rectum abut the left posterior lobe of the prostate gland.   Other: No abdominal wall hernia.   Musculoskeletal: Degenerative changes are seen within the lumbar spine. No acute bone abnormality. No lytic or blastic bone lesion.   Review of the MIP images confirms the above findings.   IMPRESSION: 1. Status post endovascular stent graft repair of the descending thoracic aorta. No aneurysm or endoleak. 2. Stable 4.1 cm ascending aortic aneurysm. Recommend annual imaging followup by CTA or MRA. This recommendation follows 2010 ACCF/AHA/AATS/ACR/ASA/SCA/SCAI/SIR/STS/SVM Guidelines for the Diagnosis and Management of Patients with Thoracic Aortic Disease. Circulation. 2010; [id number]. Aortic aneurysm NOS (ICD10-I71.9) 3. Mild coronary artery calcification. 4. 19 mm right thyroid nodule, not well characterized on this examination. 5. Interval development of mild tree-in-bud peribronchial nodularity within the right upper lobe, possibly infectious in the acute setting. 6. Asymmetric left lateral rectal wall thickening involving the low rectum with punctate foci of extraluminal gas and extensive soft tissue infiltration within the left perirectal soft tissues. This may represent inflammatory changes secondary to traumatic or inflammatory ulceration/perforation or a rectal mass with transmural extension into the perirectal soft tissues. Endoscopic correlation is recommended. Superimposed proximal proctocolitis and resultant partial large bowel obstruction with moderate colonic stool burden.   Aortic Atherosclerosis (ICD10-I70.0).     Electronically Signed   By: Fidela Salisbury M.D.   On: 08/18/2022 19:29   (I personally reviewed the CT images-HD)  __________________  There is also CT angiogram of the chest from 04/11/2022 on  file ______________________________________________________ Other studies:   _______________________________________________________ Assessment & Plan  Impression: Months of a change in bowel habits from overflow due to high-grade rectosigmoid obstruction  Anemia perhaps multifactorial but at least partially from recent GI blood loss.   Atrial fibrillation, typically on oral anticoagulation until patient stopped it about 10 days ago.  Abnormal imaging of GI tract-this is highly suspicious for an obstructing rectosigmoid mass.  It is almost  certainly extending into the pelvis accounting for the radiologic description.  The left colon is mildly distended with a considerable amount of retained stool.  The colon more proximal to that is not dilated, and his exam is reassuring.  He was started on Zosyn last evening due to the radiologic description of a punctate foci of extraluminal gas.  However, I suspect that is related to local extension of his tumor rather than active infection at present.  I recommend continuing the Zosyn at least until a sigmoidoscopy can be performed.  The patient does have an endovascular graft that would be at risk for infection.   Plan: This patient needs a sigmoidoscopy.  Dr. Lorenso Courier is our hospital consult physician this week, and I have messaged her about this patient.  I do not know if the endoscopy schedule will allow the procedure today, but the patient has been kept n.p.o. for now. He would need a tapwater enema prep, which will be arranged once our primary consult team can coordinate with the endoscopy department.  I explained at length my clinical suspicion and both Mr. Towsend and his daughter understand what we believed to be happening.  He is agreeable to the sigmoidoscopy, full risks and benefits will be reviewed with him by the performing physician.  Thank you for the courtesy of this consult.  Please contact our consult team with any questions or  concerns.  Nelida Meuse III Office: [phone]

## 2022-08-19 NOTE — Consult Note (Signed)
Reason for Consult:rectal mass Referring Provider: Darliss Cheney  Scott [name] is an 72 y.o. male.  HPI: 72 yo male presented with diarrhea for 3 months and blood per rectum. He underwent sigmoidoscopy today and was found to have a circumferential rectal mass. He has lost 10 pounds in the last 3 months. He denies nausea, vomiting, or abdominal pain.  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 He has never used smokeless tobacco. He reports current alcohol use of about 1.0 - 2.0 standard drink of alcohol per week. He reports current drug use. Drug: Marijuana.  Allergies:  Allergies  Allergen Reactions   Carvedilol Other (See Comments)    Night terrors   Amlodipine Itching   Hydralazine     Night terrors, fatigued   Valsartan     Night terrors and fatigued    Medications: I have reviewed the patient's current medications.  Results for orders placed or performed during the hospital encounter of 08/18/22 (from the past 48 hour(s))  Resp panel by RT-PCR (RSV, Flu A&B, Covid) Anterior Nasal Swab     Status: None   Collection Time: 08/18/22 12:56 PM   Specimen: Anterior Nasal Swab  Result Value Ref Range   SARS Coronavirus 2 by RT PCR NEGATIVE NEGATIVE   Influenza A by PCR NEGATIVE NEGATIVE   Influenza B by PCR NEGATIVE NEGATIVE    Comment: (NOTE) The Xpert Xpress  SARS-CoV-2/FLU/RSV plus assay is intended as an aid in the diagnosis of influenza from Nasopharyngeal swab specimens and should not be used as a sole basis for treatment. Nasal washings and aspirates are unacceptable for Xpert Xpress SARS-CoV-2/FLU/RSV testing.  Fact Sheet for Patients: EntrepreneurPulse.com.au  Fact Sheet for Healthcare Providers: IncredibleEmployment.be  This test is not yet approved or cleared by the [place] FDA and has been authorized for detection and/or diagnosis of SARS-CoV-2 by FDA under an Emergency Use Authorization (EUA). This EUA will remain in effect (meaning this test can be used) for the duration of the COVID-19 declaration under Section 564(b)(1) of the Act, 21 U.S.C. section 360bbb-3(b)(1), unless the authorization is terminated or revoked.     Resp Syncytial Virus by PCR NEGATIVE NEGATIVE    Comment: (NOTE) Fact Sheet for Patients: EntrepreneurPulse.com.au  Fact Sheet for Healthcare Providers: IncredibleEmployment.be  This test is not yet approved or cleared by the [place] FDA and has been authorized for detection and/or diagnosis of SARS-CoV-2 by FDA under an Emergency Use Authorization (EUA). This EUA will remain in effect (meaning this test can be used) for the duration of the COVID-19 declaration under Section 564(b)(1) of the Act, 21 U.S.C. section 360bbb-3(b)(1), unless the authorization is terminated or revoked.  Performed at [facility], [place] [address] [place], [place] [address]   CBC with Differential     Status: Abnormal  Collection Time: 08/18/22  1:05 PM  Result Value Ref Range   WBC 12.7 (H) 4.0 - 10.5 K/uL   RBC 3.85 (L) 4.22 - 5.81 MIL/uL   Hemoglobin 12.6 (L) 13.0 - 17.0 g/dL   HCT 37.0 (L) 39.0 - 52.0 %   MCV 96.1 80.0 - 100.0 fL   MCH 32.7 26.0 - 34.0 pg   MCHC 34.1 30.0 - 36.0 g/dL   RDW 12.6 11.5 - 15.5 %   Platelets 364  150 - 400 K/uL   nRBC 0.0 0.0 - 0.2 %   Neutrophils Relative % 80 %   Neutro Abs 10.1 (H) 1.7 - 7.7 K/uL   Lymphocytes Relative 13 %   Lymphs Abs 1.6 0.7 - 4.0 K/uL   Monocytes Relative 7 %   Monocytes Absolute 0.8 0.1 - 1.0 K/uL   Eosinophils Relative 0 %   Eosinophils Absolute 0.0 0.0 - 0.5 K/uL   Basophils Relative 0 %   Basophils Absolute 0.0 0.0 - 0.1 K/uL   Immature Granulocytes 0 %   Abs Immature Granulocytes 0.05 0.00 - 0.07 K/uL    Comment: Performed at [facility], [address], [place], [place] [address]  Comprehensive metabolic panel     Status: Abnormal   Collection Time: 08/18/22  1:05 PM  Result Value Ref Range   Sodium 136 135 - 145 mmol/L   Potassium 3.8 3.5 - 5.1 mmol/L   Chloride 104 98 - 111 mmol/L   CO2 20 (L) 22 - 32 mmol/L   Glucose, Bld 112 (H) 70 - 99 mg/dL    Comment: Glucose reference range applies only to samples taken after fasting for at least 8 hours.   BUN 22 8 - 23 mg/dL   Creatinine, Ser 1.34 (H) 0.61 - 1.24 mg/dL   Calcium 9.1 8.9 - 10.3 mg/dL   Total Protein 6.7 6.5 - 8.1 g/dL   Albumin 3.7 3.5 - 5.0 g/dL   AST 22 15 - 41 U/L   ALT 17 0 - 44 U/L   Alkaline Phosphatase 71 38 - 126 U/L   Total Bilirubin 1.1 0.3 - 1.2 mg/dL   GFR, Estimated 56 (L) >60 mL/min    Comment: (NOTE) Calculated using the CKD-EPI Creatinine Equation (2021)    Anion gap 12 5 - 15    Comment: Performed at [facility] [address], [place], [place] [address]  Lipase, blood     Status: None   Collection Time: 08/18/22  1:05 PM  Result Value Ref Range   Lipase 27 11 - 51 U/L    Comment: Performed at [facility] [address] [place], [place] [address]  Magnesium     Status: None   Collection Time: 08/18/22  1:05 PM  Result Value Ref Range   Magnesium 2.4 1.7 - 2.4 mg/dL    Comment: Performed at [facility], [place] [address], [place], [place] [address]  POC occult blood, ED     Status: Abnormal   Collection Time: 08/18/22  5:32 PM   Result Value Ref Range   Fecal Occult Bld POSITIVE (A) NEGATIVE  CK     Status: None   Collection Time: 08/18/22  9:42 PM  Result Value Ref Range   Total CK 119 49 - 397 U/L    Comment: Performed at [facility], [place] [address] [place],  [address]  Phosphorus     Status: None   Collection Time: 08/18/22  9:42 PM  Result Value Ref Range   Phosphorus 2.9 2.5 - 4.6 mg/dL    Comment: Performed at [facility], [place] [address] [place], [place] [address]  Lactic acid, plasma     Status: None   Collection Time: 08/18/22  9:42 PM  Result Value Ref Range   Lactic Acid, Venous 1.6 0.5 - 1.9 mmol/L    Comment: Performed at [facility] [address], [place], [place] [address]  Hepatic function panel     Status: Abnormal   Collection Time: 08/18/22  9:42 PM  Result Value Ref Range   Total Protein 6.9 6.5 - 8.1 g/dL   Albumin 3.3 (L) 3.5 - 5.0 g/dL   AST 25 15 - 41 U/L   ALT 15 0 - 44 U/L   Alkaline Phosphatase 68 38 - 126 U/L   Total Bilirubin 1.2 0.3 - 1.2 mg/dL   Bilirubin, Direct 0.3 (H) 0.0 - 0.2 mg/dL   Indirect Bilirubin 0.9 0.3 - 0.9 mg/dL    Comment: Performed at [facility] [address], [place], [place] [address]  Vitamin B12     Status: None   Collection Time: 08/18/22  9:42 PM  Result Value Ref Range   Vitamin B-12 514 180 - 914 pg/mL    Comment: (NOTE) This assay is not validated for testing neonatal or myeloproliferative syndrome specimens for Vitamin B12 levels. Performed at [facility], [place] [address] [place], [place] [address]   Iron and TIBC     Status: None   Collection Time: 08/18/22  9:42 PM  Result Value Ref Range   Iron 87 45 - 182 ug/dL   TIBC 377 250 - 450 ug/dL   Saturation Ratios 23 17.9 - 39.5 %   UIBC 290 ug/dL    Comment: Performed at [facility], [place] [address], [place], [place] [address]  Ferritin     Status: Abnormal   Collection Time: 08/18/22  9:42 PM  Result Value Ref Range   Ferritin 18  (L) 24 - 336 ng/mL    Comment: Performed at [facility], [place] [address] [place], [place] [address]  Reticulocytes     Status: Abnormal   Collection Time: 08/18/22  9:42 PM  Result Value Ref Range   Retic Ct Pct 1.0 0.4 - 3.1 %   RBC. 3.62 (L) 4.22 - 5.81 MIL/uL   Retic Count, Absolute 35.1 19.0 - 186.0 K/uL   Immature Retic Fract 10.9 2.3 - 15.9 %    Comment: Performed at [facility] [address] [place], [place] [address]  Folate     Status: None   Collection Time: 08/18/22  9:42 PM  Result Value Ref Range   Folate 32.1 >5.9 ng/mL    Comment: Performed at [facility], [place] [address] [place], [place] [address]  TSH     Status: None   Collection Time: 08/18/22  9:42 PM  Result Value Ref Range   TSH 1.833 0.350 - 4.500 uIU/mL    Comment: Performed by a 3rd Generation assay with a functional sensitivity of <=0.01 uIU/mL. Performed at [facility], [place] [address], [place], [place] [address]   Respiratory (~20 pathogens) panel by PCR     Status: None   Collection Time: 08/18/22 10:51 PM   Specimen: Nasopharyngeal Swab; Respiratory  Result Value Ref Range   Adenovirus NOT DETECTED NOT DETECTED   Coronavirus 229E  NOT DETECTED NOT DETECTED    Comment: (NOTE) The Coronavirus on the Respiratory Panel, DOES NOT test for the novel  Coronavirus (2019 nCoV)    Coronavirus HKU1 NOT DETECTED NOT DETECTED   Coronavirus NL63 NOT DETECTED NOT DETECTED   Coronavirus OC43 NOT DETECTED NOT DETECTED   Metapneumovirus NOT DETECTED NOT DETECTED   Rhinovirus / Enterovirus NOT DETECTED NOT DETECTED   Influenza A NOT DETECTED NOT DETECTED   Influenza B NOT DETECTED NOT DETECTED   Parainfluenza Virus 1 NOT DETECTED NOT DETECTED   Parainfluenza Virus 2 NOT DETECTED NOT DETECTED   Parainfluenza Virus 3 NOT DETECTED NOT DETECTED   Parainfluenza Virus 4 NOT DETECTED NOT DETECTED   Respiratory Syncytial Virus NOT DETECTED NOT DETECTED   Bordetella pertussis NOT DETECTED NOT  DETECTED   Bordetella Parapertussis NOT DETECTED NOT DETECTED   Chlamydophila pneumoniae NOT DETECTED NOT DETECTED   Mycoplasma pneumoniae NOT DETECTED NOT DETECTED    Comment: Performed at [facility], [place] [address], [place], [place] [address]  Prealbumin     Status: Abnormal   Collection Time: 08/19/22  3:28 AM  Result Value Ref Range   Prealbumin 10 (L) 18 - 38 mg/dL    Comment: Performed at [facility] [address], [place], [place] [address]  Lactic acid, plasma     Status: None   Collection Time: 08/19/22  3:28 AM  Result Value Ref Range   Lactic Acid, Venous 0.8 0.5 - 1.9 mmol/L    Comment: Performed at [facility] [address] [place], [place] [address]  Comprehensive metabolic panel     Status: Abnormal   Collection Time: 08/19/22  3:28 AM  Result Value Ref Range   Sodium 139 135 - 145 mmol/L   Potassium 3.8 3.5 - 5.1 mmol/L   Chloride 108 98 - 111 mmol/L   CO2 20 (L) 22 - 32 mmol/L   Glucose, Bld 104 (H) 70 - 99 mg/dL    Comment: Glucose reference range applies only to samples taken after fasting for at least 8 hours.   BUN 21 8 - 23 mg/dL   Creatinine, Ser 1.12 0.61 - 1.24 mg/dL   Calcium 8.2 (L) 8.9 - 10.3 mg/dL   Total Protein 5.6 (L) 6.5 - 8.1 g/dL   Albumin 2.9 (L) 3.5 - 5.0 g/dL   AST 15 15 - 41 U/L   ALT 13 0 - 44 U/L   Alkaline Phosphatase 60 38 - 126 U/L   Total Bilirubin 1.1 0.3 - 1.2 mg/dL   GFR, Estimated >60 >60 mL/min    Comment: (NOTE) Calculated using the CKD-EPI Creatinine Equation (2021)    Anion gap 11 5 - 15    Comment: Performed at [facility], [place] [address], [place], [place] [address]  CBC     Status: Abnormal   Collection Time: 08/19/22  3:28 AM  Result Value Ref Range   WBC 7.7 4.0 - 10.5 K/uL   RBC 3.26 (L) 4.22 - 5.81 MIL/uL   Hemoglobin 10.6 (L) 13.0 - 17.0 g/dL   HCT 31.4 (L) 39.0 - 52.0 %   MCV 96.3 80.0 - 100.0 fL   MCH 32.5 26.0 - 34.0 pg   MCHC 33.8 30.0 - 36.0 g/dL   RDW 12.6 11.5 - 15.5 %    Platelets 296 150 - 400 K/uL   nRBC 0.0 0.0 - 0.2 %    Comment: Performed at [facility], [place]  [address], [place], [place] [address]  HIV Antibody (routine testing w rflx)     Status: None   Collection Time: 08/19/22  3:28 AM  Result Value Ref Range   HIV Screen 4th Generation wRfx Non Reactive Non Reactive    Comment: Performed at [facility], [place] [address] [place], [place] [address]  Procalcitonin     Status: None   Collection Time: 08/19/22  3:28 AM  Result Value Ref Range   Procalcitonin <0.10 ng/mL    Comment:        Interpretation: PCT (Procalcitonin) <= 0.5 ng/mL: Systemic infection (sepsis) is not likely. Local bacterial infection is possible. (NOTE)       Sepsis PCT Algorithm           Lower Respiratory Tract                                      Infection PCT Algorithm    ----------------------------     ----------------------------         PCT < 0.25 ng/mL                PCT < 0.10 ng/mL          Strongly encourage             Strongly discourage   discontinuation of antibiotics    initiation of antibiotics    ----------------------------     -----------------------------       PCT 0.25 - 0.50 ng/mL            PCT 0.10 - 0.25 ng/mL               OR       >80% decrease in PCT            Discourage initiation of                                            antibiotics      Encourage discontinuation           of antibiotics    ----------------------------     -----------------------------         PCT >= 0.50 ng/mL              PCT 0.26 - 0.50 ng/mL               AND        <80% decrease in PCT             Encourage initiation of                                             antibiotics       Encourage continuation           of antibiotics    ----------------------------     -----------------------------        PCT >= 0.50 ng/mL                  PCT > 0.50 ng/mL               AND         increase  in PCT                  Strongly encourage                                       initiation of antibiotics    Strongly encourage escalation           of antibiotics                                     -----------------------------                                           PCT <= 0.25 ng/mL                                                 OR                                        > 80% decrease in PCT                                      Discontinue / Do not initiate                                             antibiotics  Performed at [facility], [address] [place], [place] [address]     CT Angio Chest/Abd/Pel for Dissection W and/or Wo Contrast  Result Date: 08/18/2022 CLINICAL DATA:  Acute aortic syndrome, diarrhea. History of aortic dissection status post repair. EXAM: CT ANGIOGRAPHY CHEST, ABDOMEN AND PELVIS TECHNIQUE: Non-contrast CT of the chest was initially obtained. Multidetector CT imaging through the chest, abdomen and pelvis was performed using the standard protocol during bolus administration of intravenous contrast. Multiplanar reconstructed images and MIPs were obtained and reviewed to evaluate the vascular anatomy. RADIATION DOSE REDUCTION: This exam was performed according to the departmental dose-optimization program which includes automated exposure control, adjustment of the mA and/or kV according to patient size and/or use of iterative reconstruction technique. CONTRAST:  86m OMNIPAQUE IOHEXOL 350 MG/ML SOLN COMPARISON:  04/11/2022, 07/02/2020 FINDINGS: CTA CHEST FINDINGS Cardiovascular: The ascending aorta demonstrates stable dilation measuring 4.1 cm in greatest dimension. No intramural hematoma, dissection, or aneurysm. Endovascular stent graft repair of the descending thoracic aorta has been performed with the proximal landing zone just distal to the left subclavian artery with overlapping stents extending to the diaphragmatic hiatus. No aneurysm or endoleak. Mild coronary artery calcification within the left anterior  descending coronary artery. Global cardiac size is within normal limits. No pericardial effusion. Central pulmonary arteries are of normal caliber. No intraluminal filling defect identified through the segmental level to suggest acute pulmonary embolism. Mediastinum/Nodes: 19 mm right thyroid nodule, not well characterized on this examination. No  pathologic thoracic adenopathy. Esophagus unremarkable. Lungs/Pleura: Subtle tree-in-bud peribronchial nodularity has developed within the right upper lobe, possibly infectious in the acute setting. No confluent pulmonary infiltrate. Bronchial wall thickening is again seen in keeping with airway inflammation. No central obstructing lesion. No pneumothorax or pleural effusion. Musculoskeletal: No acute bone abnormality. No lytic or blastic bone lesion. Review of the MIP images confirms the above findings. CTA ABDOMEN AND PELVIS FINDINGS VASCULAR Aorta: Normal caliber aorta without aneurysm, dissection, vasculitis or significant stenosis. Mild atherosclerotic calcification. Celiac: Variant anatomy with these celiac axis given rise only to the left gastric and splenic arteries. Widely patent. No aneurysm or dissection. SMA: Replaced common hepatic artery. Widely patent. No aneurysm or dissection. Renals: Single right renal artery demonstrates mild mixed atherosclerotic plaque within the mid segment resulting in a focal 50% stenosis in this location. Dual left renal arteries are widely patent. Normal vascular morphology. No aneurysm or dissection. IMA: Widely patent Inflow: Patent without evidence of aneurysm, dissection, vasculitis or significant stenosis. Veins: No obvious venous abnormality within the limitations of this arterial phase study. Review of the MIP images confirms the above findings. NON-VASCULAR Hepatobiliary: Stable simple cortical cyst within the left hepatic lobe. No enhancing intrahepatic mass. No intra or extrahepatic biliary ductal dilation. Gallbladder  unremarkable. Pancreas: Unremarkable. No pancreatic ductal dilatation or surrounding inflammatory changes. Spleen: Unremarkable Adrenals/Urinary Tract: Stable exophytic simple cyst arising from the lateral aspect of the interpolar region of the right kidney better characterized on prior examinations. No follow-up imaging is recommended for this lesion. The kidneys are otherwise unremarkable. Adrenal glands are unremarkable. Bladder unremarkable. Stomach/Bowel: There is asymmetric left lateral rectal wall thickening involving the low rectum, best seen at axial image # 278/7 with punctate foci of extraluminal gas identified and extensive soft tissue infiltration within the left perirectal soft tissues. This may represent inflammatory changes secondary to traumatic or inflammatory ulceration/perforation or a rectal mass with transmural extension into the perirectal soft tissues. There is superimposed circumferential bowel wall thickening involving the rectosigmoid colon proximally in keeping with proctocolitis. There is moderate volume stool within the colon proximally in keeping with a partial large bowel obstruction. The stomach, small bowel, and large bowel are otherwise unremarkable. No free intraperitoneal gas or fluid. Lymphatic: No pathologic adenopathy within the abdomen and pelvis. Reproductive: The prostate gland is not enlarged. Seminal vesicles are unremarkable. Inflammatory changes involving the rectum abut the left posterior lobe of the prostate gland. Other: No abdominal wall hernia. Musculoskeletal: Degenerative changes are seen within the lumbar spine. No acute bone abnormality. No lytic or blastic bone lesion. Review of the MIP images confirms the above findings. IMPRESSION: 1. Status post endovascular stent graft repair of the descending thoracic aorta. No aneurysm or endoleak. 2. Stable 4.1 cm ascending aortic aneurysm. Recommend annual imaging followup by CTA or MRA. This recommendation follows  2010 ACCF/AHA/AATS/ACR/ASA/SCA/SCAI/SIR/STS/SVM Guidelines for the Diagnosis and Management of Patients with Thoracic Aortic Disease. Circulation. 2010; [id number]. Aortic aneurysm NOS (ICD10-I71.9) 3. Mild coronary artery calcification. 4. 19 mm right thyroid nodule, not well characterized on this examination. 5. Interval development of mild tree-in-bud peribronchial nodularity within the right upper lobe, possibly infectious in the acute setting. 6. Asymmetric left lateral rectal wall thickening involving the low rectum with punctate foci of extraluminal gas and extensive soft tissue infiltration within the left perirectal soft tissues. This may represent inflammatory changes secondary to traumatic or inflammatory ulceration/perforation or a rectal mass with transmural extension into the perirectal soft tissues. Endoscopic correlation is recommended.  Superimposed proximal proctocolitis and resultant partial large bowel obstruction with moderate colonic stool burden. Aortic Atherosclerosis (ICD10-I70.0). Electronically Signed   By: Fidela Salisbury M.D.   On: 08/18/2022 19:29    ROS  PE Blood pressure 127/71, pulse 98, temperature 98 F (36.7 C), temperature source Oral, resp. rate 18, height '5\' 8"'$  (1.727 m), weight 76.4 kg, SpO2 96 %. Constitutional: NAD; conversant; no deformities Eyes: Moist conjunctiva; no lid lag; anicteric; PERRL Neck: Trachea midline; no thyromegaly Lungs: Normal respiratory effort; no tactile fremitus CV: RRR; no palpable thrills; no pitting edema GI: Abd soft, Nt, ND; no palpable hepatosplenomegaly MSK: Normal gait; no clubbing/cyanosis Psychiatric: Appropriate affect; alert and oriented x3 Lymphatic: No palpable cervical or axillary lymphadenopathy Skin: No major subcutaneous nodules. 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 CT scan images were reviewed showing rectal mass, no new liver lesions, moderate  inflammation of the proximal rectum and sigmoid with stool burden. Cecum is not dilated. Sigmoidoscopy reviewed showing rectal mass that was able to be traversed with upper endoscopy scope. -We discussed possible treatment plans of surgery soon for obstructing symptoms vs neoadjuvant chemotherapy prior to surgery. I will discuss further with the team -clear liquids at this time.  I reviewed last 24 h vitals and pain scores, last 48 h intake and output, last 24 h labs and trends, and last 24 h imaging results.  This care required high  level of medical decision making.   Arta Bruce Dezra Mandella 08/19/2022, 9:24 PM

## 2022-08-19 NOTE — Anesthesia Preprocedure Evaluation (Signed)
Anesthesia Evaluation  Patient identified by MRN, date of birth, ID band Patient awake    Reviewed: Allergy & Precautions, H&P , NPO status , Patient's Chart, lab work & pertinent test results  Airway Mallampati: II  TM Distance: >3 FB Neck ROM: Full    Dental no notable dental hx. (+) Teeth Intact, Caps, Dental Advisory Given   Pulmonary neg pulmonary ROS, pneumonia, former smoker   Pulmonary exam normal breath sounds clear to auscultation       Cardiovascular hypertension, Pt. on medications negative cardio ROS Normal cardiovascular exam Rhythm:Regular Rate:Normal   ECHO 23 1. Left ventricular ejection fraction, by estimation, is 55 to 60%. The  left ventricle has normal function. The left ventricle has no regional  wall motion abnormalities. The left ventricular internal cavity size was  mildly dilated. There is mild left  ventricular hypertrophy. Left ventricular diastolic function could not be  evaluated. Elevated left atrial pressure.   2. Right ventricular systolic function is normal. The right ventricular  size is normal. There is normal pulmonary artery systolic pressure.   3. Left atrial size was mildly dilated.   4. Right atrial size was severely dilated.   5. The mitral valve is normal in structure. Trivial mitral valve  regurgitation. No evidence of mitral stenosis.   6. The aortic valve is tricuspid. Aortic valve regurgitation is mild. No  aortic stenosis is present.   7. Aortic dilatation noted. There is mild dilatation of the aortic root,  measuring 40 mm.   8. The inferior vena cava is normal in size with greater than 50%  respiratory variability, suggesting right atrial pressure of 3 mmHg.      Neuro/Psych negative neurological ROS  negative psych ROS   GI/Hepatic negative GI ROS, Neg liver ROS,,,  Endo/Other  negative endocrine ROS    Renal/GU CRFRenal diseasenegative Renal ROS  negative  genitourinary   Musculoskeletal negative musculoskeletal ROS (+) Arthritis ,    Abdominal   Peds negative pediatric ROS (+)  Hematology negative hematology ROS (+)   Anesthesia Other Findings   Reproductive/Obstetrics negative OB ROS                             Anesthesia Physical Anesthesia Plan  ASA: 3 and emergent  Anesthesia Plan: MAC   Post-op Pain Management: Minimal or no pain anticipated   Induction: Intravenous  PONV Risk Score and Plan: 1  Airway Management Planned: Mask and Natural Airway  Additional Equipment: None  Intra-op Plan:   Post-operative Plan:   Informed Consent:      Dental advisory given  Plan Discussed with: Anesthesiologist and CRNA  Anesthesia Plan Comments:         Anesthesia Quick Evaluation

## 2022-08-20 DIAGNOSIS — C20 Malignant neoplasm of rectum: Secondary | ICD-10-CM

## 2022-08-20 DIAGNOSIS — K6289 Other specified diseases of anus and rectum: Secondary | ICD-10-CM | POA: Diagnosis not present

## 2022-08-20 DIAGNOSIS — C801 Malignant (primary) neoplasm, unspecified: Secondary | ICD-10-CM

## 2022-08-20 HISTORY — DX: Malignant (primary) neoplasm, unspecified: C80.1

## 2022-08-20 HISTORY — DX: Malignant neoplasm of rectum: C20

## 2022-08-20 MED ORDER — POLYETHYLENE GLYCOL 3350 17 G PO PACK
17.0000 g | PACK | Freq: Two times a day (BID) | ORAL | Status: DC
  Administered 2022-08-20 – 2022-08-21 (×2): 17 g via ORAL
  Filled 2022-08-20 (×2): qty 1

## 2022-08-20 MED ORDER — ADULT MULTIVITAMIN W/MINERALS CH
1.0000 | ORAL_TABLET | Freq: Every day | ORAL | Status: DC
  Administered 2022-08-20 – 2022-08-21 (×2): 1 via ORAL
  Filled 2022-08-20 (×2): qty 1

## 2022-08-20 MED ORDER — BOOST / RESOURCE BREEZE PO LIQD CUSTOM
1.0000 | Freq: Three times a day (TID) | ORAL | Status: DC
  Administered 2022-08-20 (×2): 1 via ORAL

## 2022-08-20 MED ORDER — FLEET ENEMA 7-19 GM/118ML RE ENEM
1.0000 | ENEMA | Freq: Once | RECTAL | Status: AC
  Administered 2022-08-20: 1 via RECTAL
  Filled 2022-08-20: qty 1

## 2022-08-20 NOTE — Progress Notes (Addendum)
1 Day Post-Op  Subjective: No bloating. Some pain in LLQ at times, but improved.  Passing flatus and having some liquid stool still as well.  No nausea or vomiting.  Tolerating CLD at this time.  ROS: See above, otherwise other systems negative  Objective: Vital signs in last 24 hours: Temp:  [97.6 F (36.4 C)-98.5 F (36.9 C)] 97.8 F (36.6 C) (03/01 0945) Pulse Rate:  [88-107] 91 (03/01 0945) Resp:  [17-22] 17 (03/01 0945) BP: (106-148)/(60-97) 139/74 (03/01 0945) SpO2:  [91 %-99 %] 98 % (03/01 0945) Weight:  [76.4 kg] 76.4 kg (02/29 1326) Last BM Date : 08/19/22  Intake/Output from previous day: 02/29 0701 - 03/01 0700 In: 1309.4 [P.O.:400; I.V.:600; IV Piggyback:309.4] Out: -  Intake/Output this shift: Total I/O In: 500 [P.O.:500] Out: -   PE: Gen: NAD Abd: soft, not really tender at this time, +BS, ND  Lab Results:  Recent Labs    08/18/22 1305 08/19/22 0328  WBC 12.7* 7.7  HGB 12.6* 10.6*  HCT 37.0* 31.4*  PLT 364 296   BMET Recent Labs    08/18/22 1305 08/19/22 0328  NA 136 139  K 3.8 3.8  CL 104 108  CO2 20* 20*  GLUCOSE 112* 104*  BUN 22 21  CREATININE 1.34* 1.12  CALCIUM 9.1 8.2*   PT/INR No results for input(s): "LABPROT", "INR" in the last 72 hours. CMP     Component Value Date/Time   NA 139 08/19/2022 0328   K 3.8 08/19/2022 0328   CL 108 08/19/2022 0328   CO2 20 (L) 08/19/2022 0328   GLUCOSE 104 (H) 08/19/2022 0328   BUN 21 08/19/2022 0328   CREATININE 1.12 08/19/2022 0328   CALCIUM 8.2 (L) 08/19/2022 0328   PROT 5.6 (L) 08/19/2022 0328   ALBUMIN 2.9 (L) 08/19/2022 0328   AST 15 08/19/2022 0328   ALT 13 08/19/2022 0328   ALKPHOS 60 08/19/2022 0328   BILITOT 1.1 08/19/2022 0328   GFRNONAA >60 08/19/2022 0328   GFRAA >60 02/06/2020 0436   Lipase     Component Value Date/Time   LIPASE 27 08/18/2022 1305       Studies/Results: CT Angio Chest/Abd/Pel for Dissection W and/or Wo Contrast  Result Date:  08/18/2022 CLINICAL DATA:  Acute aortic syndrome, diarrhea. History of aortic dissection status post repair. EXAM: CT ANGIOGRAPHY CHEST, ABDOMEN AND PELVIS TECHNIQUE: Non-contrast CT of the chest was initially obtained. Multidetector CT imaging through the chest, abdomen and pelvis was performed using the standard protocol during bolus administration of intravenous contrast. Multiplanar reconstructed images and MIPs were obtained and reviewed to evaluate the vascular anatomy. RADIATION DOSE REDUCTION: This exam was performed according to the departmental dose-optimization program which includes automated exposure control, adjustment of the mA and/or kV according to patient size and/or use of iterative reconstruction technique. CONTRAST:  2m OMNIPAQUE IOHEXOL 350 MG/ML SOLN COMPARISON:  04/11/2022, 07/02/2020 FINDINGS: CTA CHEST FINDINGS Cardiovascular: The ascending aorta demonstrates stable dilation measuring 4.1 cm in greatest dimension. No intramural hematoma, dissection, or aneurysm. Endovascular stent graft repair of the descending thoracic aorta has been performed with the proximal landing zone just distal to the left subclavian artery with overlapping stents extending to the diaphragmatic hiatus. No aneurysm or endoleak. Mild coronary artery calcification within the left anterior descending coronary artery. Global cardiac size is within normal limits. No pericardial effusion. Central pulmonary arteries are of normal caliber. No intraluminal filling defect identified through the segmental level to suggest acute pulmonary embolism. Mediastinum/Nodes:  19 mm right thyroid nodule, not well characterized on this examination. No pathologic thoracic adenopathy. Esophagus unremarkable. Lungs/Pleura: Subtle tree-in-bud peribronchial nodularity has developed within the right upper lobe, possibly infectious in the acute setting. No confluent pulmonary infiltrate. Bronchial wall thickening is again seen in keeping with  airway inflammation. No central obstructing lesion. No pneumothorax or pleural effusion. Musculoskeletal: No acute bone abnormality. No lytic or blastic bone lesion. Review of the MIP images confirms the above findings. CTA ABDOMEN AND PELVIS FINDINGS VASCULAR Aorta: Normal caliber aorta without aneurysm, dissection, vasculitis or significant stenosis. Mild atherosclerotic calcification. Celiac: Variant anatomy with these celiac axis given rise only to the left gastric and splenic arteries. Widely patent. No aneurysm or dissection. SMA: Replaced common hepatic artery. Widely patent. No aneurysm or dissection. Renals: Single right renal artery demonstrates mild mixed atherosclerotic plaque within the mid segment resulting in a focal 50% stenosis in this location. Dual left renal arteries are widely patent. Normal vascular morphology. No aneurysm or dissection. IMA: Widely patent Inflow: Patent without evidence of aneurysm, dissection, vasculitis or significant stenosis. Veins: No obvious venous abnormality within the limitations of this arterial phase study. Review of the MIP images confirms the above findings. NON-VASCULAR Hepatobiliary: Stable simple cortical cyst within the left hepatic lobe. No enhancing intrahepatic mass. No intra or extrahepatic biliary ductal dilation. Gallbladder unremarkable. Pancreas: Unremarkable. No pancreatic ductal dilatation or surrounding inflammatory changes. Spleen: Unremarkable Adrenals/Urinary Tract: Stable exophytic simple cyst arising from the lateral aspect of the interpolar region of the right kidney better characterized on prior examinations. No follow-up imaging is recommended for this lesion. The kidneys are otherwise unremarkable. Adrenal glands are unremarkable. Bladder unremarkable. Stomach/Bowel: There is asymmetric left lateral rectal wall thickening involving the low rectum, best seen at axial image # 278/7 with punctate foci of extraluminal gas identified and  extensive soft tissue infiltration within the left perirectal soft tissues. This may represent inflammatory changes secondary to traumatic or inflammatory ulceration/perforation or a rectal mass with transmural extension into the perirectal soft tissues. There is superimposed circumferential bowel wall thickening involving the rectosigmoid colon proximally in keeping with proctocolitis. There is moderate volume stool within the colon proximally in keeping with a partial large bowel obstruction. The stomach, small bowel, and large bowel are otherwise unremarkable. No free intraperitoneal gas or fluid. Lymphatic: No pathologic adenopathy within the abdomen and pelvis. Reproductive: The prostate gland is not enlarged. Seminal vesicles are unremarkable. Inflammatory changes involving the rectum abut the left posterior lobe of the prostate gland. Other: No abdominal wall hernia. Musculoskeletal: Degenerative changes are seen within the lumbar spine. No acute bone abnormality. No lytic or blastic bone lesion. Review of the MIP images confirms the above findings. IMPRESSION: 1. Status post endovascular stent graft repair of the descending thoracic aorta. No aneurysm or endoleak. 2. Stable 4.1 cm ascending aortic aneurysm. Recommend annual imaging followup by CTA or MRA. This recommendation follows 2010 ACCF/AHA/AATS/ACR/ASA/SCA/SCAI/SIR/STS/SVM Guidelines for the Diagnosis and Management of Patients with Thoracic Aortic Disease. Circulation. 2010; [id number]. Aortic aneurysm NOS (ICD10-I71.9) 3. Mild coronary artery calcification. 4. 19 mm right thyroid nodule, not well characterized on this examination. 5. Interval development of mild tree-in-bud peribronchial nodularity within the right upper lobe, possibly infectious in the acute setting. 6. Asymmetric left lateral rectal wall thickening involving the low rectum with punctate foci of extraluminal gas and extensive soft tissue infiltration within the left perirectal  soft tissues. This may represent inflammatory changes secondary to traumatic or inflammatory ulceration/perforation or a rectal  mass with transmural extension into the perirectal soft tissues. Endoscopic correlation is recommended. Superimposed proximal proctocolitis and resultant partial large bowel obstruction with moderate colonic stool burden. Aortic Atherosclerosis (ICD10-I70.0). Electronically Signed   By: Fidela Salisbury M.D.   On: 08/18/2022 19:29    Anti-infectives: Anti-infectives (From admission, onward)    Start     Dose/Rate Route Frequency Ordered Stop   08/19/22 0400  piperacillin-tazobactam (ZOSYN) IVPB 3.375 g        3.375 g 12.5 mL/hr over 240 Minutes Intravenous Every 8 hours 08/18/22 2014     08/18/22 2300  azithromycin (ZITHROMAX) 500 mg in sodium chloride 0.9 % 250 mL IVPB  Status:  Discontinued        500 mg 250 mL/hr over 60 Minutes Intravenous Every 24 hours 08/18/22 2250 08/19/22 1010   08/18/22 2030  piperacillin-tazobactam (ZOSYN) IVPB 3.375 g        3.375 g 100 mL/hr over 30 Minutes Intravenous  Once 08/18/22 2014 08/18/22 2111        Assessment/Plan Partially obstructing rectal mass, likely malignant -essentially got staging scans on admission with CT C/A/P.  No evidence of mets seen -baseline CEA -will ultimately need a pelvic MRI for complete staging of the rectal mass -needs onc consult, as first line treatment is not surgery, but neoadjuvant therapy.  This will help with bleeding and obstructive symptoms as the mass will likely shrink from therapy.   -he is not completely obstructed as the scope was able to traverse the mass.  -agree with miralax, but will increase to BID to keep stools soft/thin to be able to pass.  Will also give a fleet enema today to help break up the bottom part of this stool to get things moving.   -may adv diet as tolerates from surgical standpoint -recommendations were discussed with patient and wife,daughter at the bedside -I  have relayed all these recommendations to the primary service. -no role for surgery at this time.  Patient can be referred as needed by oncology at their discretion. -we are available if needed.   I reviewed Consultant GI notes, hospitalist notes, last 24 h vitals and pain scores, last 48 h intake and output, last 24 h labs and trends, and last 24 h imaging results.   LOS: 2 days    Henreitta Cea , [facility] 08/20/2022, 11:15 AM Please see Amion for pager number during day hours 7:00am-4:30pm or 7:00am -11:30am on weekends

## 2022-08-20 NOTE — Anesthesia Postprocedure Evaluation (Signed)
Anesthesia Post Note  Patient: Scott [name]  Procedure(s) Performed: [place] TATTOO INJECTION     Patient location during evaluation: PACU Anesthesia Type: MAC Level of consciousness: awake and alert Pain management: pain level controlled Vital Signs Assessment: post-procedure vital signs reviewed and stable Respiratory status: spontaneous breathing, nonlabored ventilation, respiratory function stable and patient connected to nasal cannula oxygen Cardiovascular status: stable and blood pressure returned to baseline Postop Assessment: no apparent nausea or vomiting Anesthetic complications: no   No notable events documented.  Last Vitals:  Vitals:   08/19/22 2047 08/20/22 0444  BP: 127/71 106/60  Pulse: 98 89  Resp: 18 18  Temp: 36.7 C   SpO2: 96% 96%    Last Pain:  Vitals:   08/20/22 0225  TempSrc:   PainSc: 8                  Markesha Hannig

## 2022-08-20 NOTE — Progress Notes (Addendum)
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  Clinical details also in his admission history and physical.  For about 2 months he has had what he calls diarrhea, which is about 20 small and often liquid BMs per day with feelings of incomplete evacuation.  He has generalized abdominal discomfort.  There was some intermittent rectal bleeding for the last couple of months as well, but that has since stopped since he stopped his oral anticoagulation on his own about 10 days ago.  He had been trying to get medical attention at the [facility] for the symptoms, as he thought that may have something to do with changes in his cardiac meds months ago.  He was unable to get seen by [facility] and does not have a local PCP. He denies fever or chills, though he has had perhaps at least a 10 pound weight loss in the last several weeks. This patient has never had prior colorectal cancer screening.  He reports having been apprehensive about colonoscopy because he believes you had complications from that procedure.  Assessment & Plan:   Principal Problem:   Proctitis Active Problems:   Dissecting AAA (abdominal aortic aneurysm) ([place])   Essential hypertension   CAP (community acquired pneumonia)   Persistent atrial fibrillation ([name])   Large bowel obstruction ([name])   CKD (chronic kidney disease) stage 2, GFR 60-89 ml/min  Proctitis/large bowel obstruction/rectal mass: S/p sigmoidoscopy, was found to have partially obstructing large rectal mass.  General surgery consulted, will likely need resection.  Continue Zosyn in the  meantime.  I did not 2 PM.  Received a message from general surgery earlier that since the mass as partially obstructing, there is no indication of general surgery and they recommended consulting oncology for possible adjuvant chemotherapy.  I discussed with and consulted Dr. Lorenso Courier of oncology who is going to see the patient either today or tomorrow morning but he clarified that the chemotherapy will be initiated as outpatient.  History of dissecting AAA: S/p repair with stent placement.  Essential hypertension: Controlled.  Continue home dose of labetalol.  Persistent atrial fibrillation/acute blood loss anemia: Patient stopped his Eliquis on his own due to GI bleeding, patient is going to get sigmoidoscopy and he will likely need biopsy so we will continue to hold Eliquis for now.  Waiting for timing of surgery.  His baseline hemoglobin is between 13.5-15.5 which is trending down to 10.6 today.  Will monitor daily, no indication of transfusion yet.  Avoid anticoagulation for now.  CKD stage IIIa: Stable.  Avoid nephrotoxic agents.  BPH: Continue Flomax.  Community-acquired pneumonia ruled out: He was admitted with presumed community-acquired pneumonia based on the CT findings but patient denies any respiratory symptoms, he is not hypoxic, procalcitonin is unremarkable as well so I do not think he has pneumonia.  DVT prophylaxis: SCDs Start: 08/18/22 2137   Code Status: Full Code  Family Communication: His wife present at bedside.  Plan of care discussed with patient in length and he/she verbalized understanding and agreed with it.  Status is: Inpatient Remains inpatient appropriate because: Patient needs surgery   Estimated body mass  index is 25.61 kg/m as calculated from the following:   Height as of this encounter: '5\' 8"'$  (1.727 m).   Weight as of this encounter: 76.4 kg.    Nutritional Assessment: Body mass index is 25.61 kg/m.Marland Kitchen Seen by dietician.  I agree with the assessment and  plan as outlined below: Nutrition Status: Nutrition Problem: Inadequate oral intake Etiology: altered GI function Signs/Symptoms: NPO status Interventions: Ensure Enlive (each supplement provides 350kcal and 20 grams of protein), MVI  . Skin Assessment: I have examined the patient's skin and I agree with the wound assessment as performed by the wound care RN as outlined below:    Consultants:  GI General surgery Procedures:  As above  Antimicrobials:  Anti-infectives (From admission, onward)    Start     Dose/Rate Route Frequency Ordered Stop   08/19/22 0400  piperacillin-tazobactam (ZOSYN) IVPB 3.375 g        3.375 g 12.5 mL/hr over 240 Minutes Intravenous Every 8 hours 08/18/22 2014     08/18/22 2300  azithromycin (ZITHROMAX) 500 mg in sodium chloride 0.9 % 250 mL IVPB  Status:  Discontinued        500 mg 250 mL/hr over 60 Minutes Intravenous Every 24 hours 08/18/22 2250 08/19/22 1010   08/18/22 2030  piperacillin-tazobactam (ZOSYN) IVPB 3.375 g        3.375 g 100 mL/hr over 30 Minutes Intravenous  Once 08/18/22 2014 08/18/22 2111         Subjective: Seen and examined.  Patient has no complaints.  Wife at the bedside.  Patient is just curious about his next options and he is waiting for general surgery's visit today.  Objective: Vitals:   08/19/22 1643 08/19/22 2047 08/20/22 0444 08/20/22 0945  BP: (!) 146/81 127/71 106/60 139/74  Pulse: 88 98 89 91  Resp: '19 18 18 17  '$ Temp: 98.2 F (36.8 C) 98 F (36.7 C)  97.8 F (36.6 C)  TempSrc: Oral Oral  Oral  SpO2: 98% 96% 96% 98%  Weight:      Height:        Intake/Output Summary (Last 24 hours) at 08/20/2022 1029 Last data filed at 08/20/2022 0800 Gross per 24 hour  Intake 1809.35 ml  Output --  Net 1809.35 ml    Filed Weights   08/18/22 1316 08/19/22 0100 08/19/22 1326  Weight: 73.5 kg 76.4 kg 76.4 kg    Examination:  General exam: Appears calm and comfortable  Respiratory system: Clear to auscultation.  Respiratory effort normal. Cardiovascular system: S1 & S2 heard, RRR. No JVD, murmurs, rubs, gallops or clicks. No pedal edema. Gastrointestinal system: Abdomen is nondistended, soft and nontender. No organomegaly or masses felt. Normal bowel sounds heard. Central nervous system: Alert and oriented. No focal neurological deficits. Extremities: Symmetric 5 x 5 power. Skin: No rashes, lesions or ulcers.  Psychiatry: Judgement and insight appear normal. Mood & affect appropriate.   Data Reviewed: I have personally reviewed following labs and imaging studies  CBC: Recent Labs  Lab 08/18/22 1305 08/19/22 0328  WBC 12.7* 7.7  NEUTROABS 10.1*  --   HGB 12.6* 10.6*  HCT 37.0* 31.4*  MCV 96.1 96.3  PLT 364 0000000    Basic Metabolic Panel: Recent Labs  Lab 08/18/22 1305 08/18/22 2142 08/19/22 0328  NA 136  --  139  K 3.8  --  3.8  CL 104  --  108  CO2 20*  --  20*  GLUCOSE 112*  --  104*  BUN 22  --  21  CREATININE 1.34*  --  1.12  CALCIUM 9.1  --  8.2*  MG 2.4  --   --   PHOS  --  2.9  --     GFR: Estimated Creatinine Clearance: 56.8 mL/min (by C-G formula based on SCr of 1.12 mg/dL). Liver Function Tests: Recent Labs  Lab 08/18/22 1305 08/18/22 2142 08/19/22 0328  AST '22 25 15  '$ ALT '17 15 13  '$ ALKPHOS 71 68 60  BILITOT 1.1 1.2 1.1  PROT 6.7 6.9 5.6*  ALBUMIN 3.7 3.3* 2.9*    Recent Labs  Lab 08/18/22 1305  LIPASE 27    No results for input(s): "AMMONIA" in the last 168 hours. Coagulation Profile: No results for input(s): "INR", "PROTIME" in the last 168 hours. Cardiac Enzymes: Recent Labs  Lab 08/18/22 2142  CKTOTAL 119    BNP (last 3 results) No results for input(s): "PROBNP" in the last 8760 hours. HbA1C: No results for input(s): "HGBA1C" in the last 72 hours. CBG: No results for input(s): "GLUCAP" in the last 168 hours. Lipid Profile: No results for input(s): "CHOL", "HDL", "LDLCALC", "TRIG", "CHOLHDL", "LDLDIRECT" in the last 72 hours. Thyroid  Function Tests: Recent Labs    08/18/22 2142  TSH 1.833    Anemia Panel: Recent Labs    08/18/22 2142  VITAMINB12 514  FOLATE 32.1  FERRITIN 18*  TIBC 377  IRON 87  RETICCTPCT 1.0    Sepsis Labs: Recent Labs  Lab 08/18/22 2142 08/19/22 0328  PROCALCITON  --  <0.10  LATICACIDVEN 1.6 0.8     Recent Results (from the past 240 hour(s))  Resp panel by RT-PCR (RSV, Flu A&B, Covid) Anterior Nasal Swab     Status: None   Collection Time: 08/18/22 12:56 PM   Specimen: Anterior Nasal Swab  Result Value Ref Range Status   SARS Coronavirus 2 by RT PCR NEGATIVE NEGATIVE Final   Influenza A by PCR NEGATIVE NEGATIVE Final   Influenza B by PCR NEGATIVE NEGATIVE Final    Comment: (NOTE) The Xpert Xpress SARS-CoV-2/FLU/RSV plus assay is intended as an aid in the diagnosis of influenza from Nasopharyngeal swab specimens and should not be used as a sole basis for treatment. Nasal washings and aspirates are unacceptable for Xpert Xpress SARS-CoV-2/FLU/RSV testing.  Fact Sheet for Patients: EntrepreneurPulse.com.au  Fact Sheet for Healthcare Providers: IncredibleEmployment.be  This test is not yet approved or cleared by the [place] FDA and has been authorized for detection and/or diagnosis of SARS-CoV-2 by FDA under an Emergency Use Authorization (EUA). This EUA will remain in effect (meaning this test can be used) for the duration of the COVID-19 declaration under Section 564(b)(1) of the Act, 21 U.S.C. section 360bbb-3(b)(1), unless the authorization is terminated or revoked.     Resp Syncytial Virus by PCR NEGATIVE NEGATIVE Final    Comment: (NOTE) Fact Sheet for Patients: EntrepreneurPulse.com.au  Fact Sheet for Healthcare Providers: IncredibleEmployment.be  This test is not yet approved or cleared by the [place] FDA and has been authorized for detection and/or diagnosis of SARS-CoV-2  by FDA under an Emergency Use Authorization (EUA). This EUA will remain in effect (meaning this test can be used) for the duration of the COVID-19 declaration under Section 564(b)(1) of the Act, 21 U.S.C. section 360bbb-3(b)(1), unless the authorization is terminated or revoked.  Performed at [facility], [place] [address] [place], [place] [address]   Respiratory (~20 pathogens) panel by PCR  Status: None   Collection Time: 08/18/22 10:51 PM   Specimen: Nasopharyngeal Swab; Respiratory  Result Value Ref Range Status   Adenovirus NOT DETECTED NOT DETECTED Final   Coronavirus 229E NOT DETECTED NOT DETECTED Final    Comment: (NOTE) The Coronavirus on the Respiratory Panel, DOES NOT test for the novel  Coronavirus (2019 nCoV)    Coronavirus HKU1 NOT DETECTED NOT DETECTED Final   Coronavirus NL63 NOT DETECTED NOT DETECTED Final   Coronavirus OC43 NOT DETECTED NOT DETECTED Final   Metapneumovirus NOT DETECTED NOT DETECTED Final   Rhinovirus / Enterovirus NOT DETECTED NOT DETECTED Final   Influenza A NOT DETECTED NOT DETECTED Final   Influenza B NOT DETECTED NOT DETECTED Final   Parainfluenza Virus 1 NOT DETECTED NOT DETECTED Final   Parainfluenza Virus 2 NOT DETECTED NOT DETECTED Final   Parainfluenza Virus 3 NOT DETECTED NOT DETECTED Final   Parainfluenza Virus 4 NOT DETECTED NOT DETECTED Final   Respiratory Syncytial Virus NOT DETECTED NOT DETECTED Final   Bordetella pertussis NOT DETECTED NOT DETECTED Final   Bordetella Parapertussis NOT DETECTED NOT DETECTED Final   Chlamydophila pneumoniae NOT DETECTED NOT DETECTED Final   Mycoplasma pneumoniae NOT DETECTED NOT DETECTED Final    Comment: Performed at [facility], [place]. [address], [place], [place] [address]     Radiology Studies: CT Angio Chest/Abd/Pel for Dissection W and/or Wo Contrast  Result Date: 08/18/2022 CLINICAL DATA:  Acute aortic syndrome, diarrhea. History of aortic dissection status post repair.  EXAM: CT ANGIOGRAPHY CHEST, ABDOMEN AND PELVIS TECHNIQUE: Non-contrast CT of the chest was initially obtained. Multidetector CT imaging through the chest, abdomen and pelvis was performed using the standard protocol during bolus administration of intravenous contrast. Multiplanar reconstructed images and MIPs were obtained and reviewed to evaluate the vascular anatomy. RADIATION DOSE REDUCTION: This exam was performed according to the departmental dose-optimization program which includes automated exposure control, adjustment of the mA and/or kV according to patient size and/or use of iterative reconstruction technique. CONTRAST:  75m OMNIPAQUE IOHEXOL 350 MG/ML SOLN COMPARISON:  04/11/2022, 07/02/2020 FINDINGS: CTA CHEST FINDINGS Cardiovascular: The ascending aorta demonstrates stable dilation measuring 4.1 cm in greatest dimension. No intramural hematoma, dissection, or aneurysm. Endovascular stent graft repair of the descending thoracic aorta has been performed with the proximal landing zone just distal to the left subclavian artery with overlapping stents extending to the diaphragmatic hiatus. No aneurysm or endoleak. Mild coronary artery calcification within the left anterior descending coronary artery. Global cardiac size is within normal limits. No pericardial effusion. Central pulmonary arteries are of normal caliber. No intraluminal filling defect identified through the segmental level to suggest acute pulmonary embolism. Mediastinum/Nodes: 19 mm right thyroid nodule, not well characterized on this examination. No pathologic thoracic adenopathy. Esophagus unremarkable. Lungs/Pleura: Subtle tree-in-bud peribronchial nodularity has developed within the right upper lobe, possibly infectious in the acute setting. No confluent pulmonary infiltrate. Bronchial wall thickening is again seen in keeping with airway inflammation. No central obstructing lesion. No pneumothorax or pleural effusion. Musculoskeletal: No  acute bone abnormality. No lytic or blastic bone lesion. Review of the MIP images confirms the above findings. CTA ABDOMEN AND PELVIS FINDINGS VASCULAR Aorta: Normal caliber aorta without aneurysm, dissection, vasculitis or significant stenosis. Mild atherosclerotic calcification. Celiac: Variant anatomy with these celiac axis given rise only to the left gastric and splenic arteries. Widely patent. No aneurysm or dissection. SMA: Replaced common hepatic artery. Widely patent. No aneurysm or dissection. Renals: Single right renal artery demonstrates mild  mixed atherosclerotic plaque within the mid segment resulting in a focal 50% stenosis in this location. Dual left renal arteries are widely patent. Normal vascular morphology. No aneurysm or dissection. IMA: Widely patent Inflow: Patent without evidence of aneurysm, dissection, vasculitis or significant stenosis. Veins: No obvious venous abnormality within the limitations of this arterial phase study. Review of the MIP images confirms the above findings. NON-VASCULAR Hepatobiliary: Stable simple cortical cyst within the left hepatic lobe. No enhancing intrahepatic mass. No intra or extrahepatic biliary ductal dilation. Gallbladder unremarkable. Pancreas: Unremarkable. No pancreatic ductal dilatation or surrounding inflammatory changes. Spleen: Unremarkable Adrenals/Urinary Tract: Stable exophytic simple cyst arising from the lateral aspect of the interpolar region of the right kidney better characterized on prior examinations. No follow-up imaging is recommended for this lesion. The kidneys are otherwise unremarkable. Adrenal glands are unremarkable. Bladder unremarkable. Stomach/Bowel: There is asymmetric left lateral rectal wall thickening involving the low rectum, best seen at axial image # 278/7 with punctate foci of extraluminal gas identified and extensive soft tissue infiltration within the left perirectal soft tissues. This may represent inflammatory changes  secondary to traumatic or inflammatory ulceration/perforation or a rectal mass with transmural extension into the perirectal soft tissues. There is superimposed circumferential bowel wall thickening involving the rectosigmoid colon proximally in keeping with proctocolitis. There is moderate volume stool within the colon proximally in keeping with a partial large bowel obstruction. The stomach, small bowel, and large bowel are otherwise unremarkable. No free intraperitoneal gas or fluid. Lymphatic: No pathologic adenopathy within the abdomen and pelvis. Reproductive: The prostate gland is not enlarged. Seminal vesicles are unremarkable. Inflammatory changes involving the rectum abut the left posterior lobe of the prostate gland. Other: No abdominal wall hernia. Musculoskeletal: Degenerative changes are seen within the lumbar spine. No acute bone abnormality. No lytic or blastic bone lesion. Review of the MIP images confirms the above findings. IMPRESSION: 1. Status post endovascular stent graft repair of the descending thoracic aorta. No aneurysm or endoleak. 2. Stable 4.1 cm ascending aortic aneurysm. Recommend annual imaging followup by CTA or MRA. This recommendation follows 2010 ACCF/AHA/AATS/ACR/ASA/SCA/SCAI/SIR/STS/SVM Guidelines for the Diagnosis and Management of Patients with Thoracic Aortic Disease. Circulation. 2010; [id number]. Aortic aneurysm NOS (ICD10-I71.9) 3. Mild coronary artery calcification. 4. 19 mm right thyroid nodule, not well characterized on this examination. 5. Interval development of mild tree-in-bud peribronchial nodularity within the right upper lobe, possibly infectious in the acute setting. 6. Asymmetric left lateral rectal wall thickening involving the low rectum with punctate foci of extraluminal gas and extensive soft tissue infiltration within the left perirectal soft tissues. This may represent inflammatory changes secondary to traumatic or inflammatory ulceration/perforation  or a rectal mass with transmural extension into the perirectal soft tissues. Endoscopic correlation is recommended. Superimposed proximal proctocolitis and resultant partial large bowel obstruction with moderate colonic stool burden. Aortic Atherosclerosis (ICD10-I70.0). Electronically Signed   By: Fidela Salisbury M.D.   On: 08/18/2022 19:29    Scheduled Meds:  feeding supplement  1 Container Oral TID BM   labetalol  100 mg Oral TID   multivitamin with minerals  1 tablet Oral Daily   polyethylene glycol  17 g Oral Daily   rOPINIRole  0.5 mg Oral QHS   tamsulosin  0.4 mg Oral QPC supper   Continuous Infusions:  lactated ringers Stopped (08/19/22 1500)   piperacillin-tazobactam (ZOSYN)  IV 3.375 g (08/20/22 0346)     LOS: 2 days   Darliss Cheney, MD [facility]  08/20/2022, 10:29 AM   *  Please note that this is a verbal dictation therefore any spelling or grammatical errors are due to the "[place]" system interpretation.  Please page via [place] and do not message via secure chat for urgent patient care matters. Secure chat can be used for non urgent patient care matters.  How to contact the [facility] Attending or Consulting provider [place] or covering provider during after hours [place], for this patient?  Check the care team in [facility] and look for a) attending/consulting [facility] provider listed and b) the [name] team listed. Page or secure chat [phone]. Log into [email or web address] and use 's universal password to access. If you do not have the password, please contact the hospital operator. Locate the [facility] provider you are looking for under [facility] and page to a number that you can be directly reached. If you still have difficulty reaching the provider, please page the [facility] (Director on Call) for the Hospitalists listed on amion for assistance.

## 2022-08-21 ENCOUNTER — Inpatient Hospital Stay (HOSPITAL_COMMUNITY): Payer: No Typology Code available for payment source

## 2022-08-21 ENCOUNTER — Other Ambulatory Visit: Payer: Self-pay | Admitting: Hematology and Oncology

## 2022-08-21 DIAGNOSIS — K6289 Other specified diseases of anus and rectum: Secondary | ICD-10-CM

## 2022-08-21 LAB — CBC WITH DIFFERENTIAL/PLATELET
Abs Immature Granulocytes: 0.03 10*3/uL (ref 0.00–0.07)
Basophils Absolute: 0 10*3/uL (ref 0.0–0.1)
Basophils Relative: 0 %
Eosinophils Absolute: 0.1 10*3/uL (ref 0.0–0.5)
Eosinophils Relative: 1 %
HCT: 31.2 % — ABNORMAL LOW (ref 39.0–52.0)
Hemoglobin: 10.5 g/dL — ABNORMAL LOW (ref 13.0–17.0)
Immature Granulocytes: 0 %
Lymphocytes Relative: 19 %
Lymphs Abs: 1.5 10*3/uL (ref 0.7–4.0)
MCH: 32.7 pg (ref 26.0–34.0)
MCHC: 33.7 g/dL (ref 30.0–36.0)
MCV: 97.2 fL (ref 80.0–100.0)
Monocytes Absolute: 0.6 10*3/uL (ref 0.1–1.0)
Monocytes Relative: 8 %
Neutro Abs: 5.4 10*3/uL (ref 1.7–7.7)
Neutrophils Relative %: 72 %
Platelets: 300 10*3/uL (ref 150–400)
RBC: 3.21 MIL/uL — ABNORMAL LOW (ref 4.22–5.81)
RDW: 12.5 % (ref 11.5–15.5)
WBC: 7.6 10*3/uL (ref 4.0–10.5)
nRBC: 0 % (ref 0.0–0.2)

## 2022-08-21 LAB — BASIC METABOLIC PANEL
Anion gap: 11 (ref 5–15)
BUN: 16 mg/dL (ref 8–23)
CO2: 23 mmol/L (ref 22–32)
Calcium: 8.4 mg/dL — ABNORMAL LOW (ref 8.9–10.3)
Chloride: 106 mmol/L (ref 98–111)
Creatinine, Ser: 1.1 mg/dL (ref 0.61–1.24)
GFR, Estimated: >60 mL/min (ref >60)
Glucose, Bld: 116 mg/dL — ABNORMAL HIGH (ref 70–99)
Potassium: 3.4 mmol/L — ABNORMAL LOW (ref 3.5–5.1)
Sodium: 140 mmol/L (ref 135–145)

## 2022-08-21 LAB — CEA: CEA: 32.6 ng/mL — ABNORMAL HIGH (ref 0.0–4.7)

## 2022-08-21 MED ORDER — LACTULOSE 10 GM/15ML PO SOLN
30.0000 g | Freq: Once | ORAL | Status: AC
  Administered 2022-08-21: 30 g via ORAL
  Filled 2022-08-21: qty 45

## 2022-08-21 MED ORDER — BISACODYL 10 MG RE SUPP
10.0000 mg | Freq: Once | RECTAL | Status: AC
  Administered 2022-08-21: 10 mg via RECTAL
  Filled 2022-08-21: qty 1

## 2022-08-21 MED ORDER — APIXABAN 5 MG PO TABS
5.0000 mg | ORAL_TABLET | Freq: Two times a day (BID) | ORAL | Status: DC
  Administered 2022-08-21: 5 mg via ORAL
  Filled 2022-08-21: qty 1

## 2022-08-21 NOTE — Progress Notes (Signed)
Pt was taken to vehicle via WC.   Scott [name]

## 2022-08-21 NOTE — Discharge Summary (Signed)
Physician Discharge Summary  Barrington Rihn [id number] DOB: [birth year] DOA: 08/18/2022  PCP: [facility]  Admit date: 08/18/2022 Discharge date: 08/21/2022 30 Day Unplanned Readmission Risk Score    Flowsheet Row ED to Hosp-Admission (Current) from 08/18/2022 in [facility] 77M KIDNEY UNIT  30 Day Unplanned Readmission Risk Score (%) 15.12 Filed at 08/21/2022 0800       This score is the patient's risk of an unplanned readmission within 30 days of being discharged (0 -100%). The score is based on dignosis, age, lab data, medications, orders, and past utilization.   Low:  0-14.9   Medium: 15-21.9   High: 22-29.9   Extreme: 30 and above          Admitted From: Home Disposition: Home  Recommendations for Outpatient Follow-up:  Follow up with PCP in 1-2 weeks Please obtain BMP/CBC in one week Please follow up with your PCP on the following pending results: Unresulted Labs (From admission, onward)     Start     Ordered   08/20/22 1115  CEA  Once,   R        08/20/22 1115   08/18/22 2251  Expectorated Sputum Assessment w Gram Stain, Rflx to Resp Cult  Once,   R        08/18/22 2250   08/18/22 2251  Legionella Pneumophila Serogp 1 Ur Ag  Once,   R        08/18/22 2250   08/18/22 2251  Strep pneumoniae urinary antigen  Once,   R        08/18/22 2250   08/18/22 1256  Urinalysis, Routine w reflex microscopic -Urine, Clean Catch  (ED Abdominal Pain)  Once,   URGENT       Question:  Specimen Source  Answer:  Urine, Clean Catch   08/18/22 [place]: None Equipment/Devices: None  Discharge Condition: Stable CODE STATUS: Full code Diet recommendation: Cardiac  Subjective: Seen and examined.  He has no complaints other than constipation.  He is passing flatus.  He does not have any abdominal pain, nausea or vomiting.  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  For about 2 months he has had what he calls diarrhea, which is about 20 small and often liquid BMs per day with feelings of incomplete evacuation.  He has generalized abdominal discomfort.  There was some intermittent rectal bleeding for the last couple of months as well, but that has since stopped since he stopped his oral anticoagulation on his own about 10 days ago.  He had been trying to get medical attention at the [facility] for the symptoms, as he thought that may have something to do with changes in his cardiac meds months ago.  He was unable to get seen by [facility] and does not have a local PCP. This patient has never had prior colorectal cancer screening.  He reports having been apprehensive about colonoscopy because he believes you had complications from that procedure.  Patient was admitted under hospitalist service with the diagnosis of proctitis and rectal mass.  Details below.   Proctitis/large bowel obstruction/rectal mass: S/p sigmoidoscopy, was found to have partially obstructing  large rectal mass.  General surgery consulted per GI recommendations however since his masses not fully obstructed, general surgery recommended no surgical intervention at this time but recommended oncology consultation and possibly starting with the chemotherapy.  I consulted Dr. Lorenso Courier of cardiology yesterday, who has seen this patient today and has cleared the patient for discharge and will follow-up with the patient as outpatient to initiate chemotherapy.  Dr. Libby Maw note is not complete at the time of this dictation.  Patient has been strongly advised to avoid constipation, he is advised to take MiraLAX twice daily, senna as well as Colace and if that does not work, she will take p.o. Dulcolax,  all of those are available over-the-counter.  Patient and his wife at the bedside verbalized understanding.   History of dissecting AAA: S/p repair with stent placement.   Essential hypertension: Controlled.  Continue home dose of labetalol.   Persistent atrial fibrillation/acute blood loss anemia: Patient stopped his Eliquis on his own due to GI bleeding, Eliquis was held while he was here because he underwent sigmoidoscopy and biopsy.  Now we are resuming this because patient's hemoglobin has remained stable and there are no reports of rectal bleeding or melena.   CKD stage IIIa: Stable.  Avoid nephrotoxic agents.   BPH: Continue Flomax.   Community-acquired pneumonia ruled out: He was admitted with presumed community-acquired pneumonia based on the CT findings but patient denies any respiratory symptoms, he is not hypoxic, procalcitonin is unremarkable as well so I do not think he has pneumonia.  Antibiotics were discontinued.    Discharge plan was discussed with patient and/or family member and they verbalized understanding and agreed with it.  Discharge Diagnoses:  Principal Problem:   Proctitis Active Problems:   Dissecting AAA (abdominal aortic aneurysm) ([place])   Essential hypertension   CAP (community acquired pneumonia)   Persistent atrial fibrillation ([name])   Large bowel obstruction ([name])   CKD (chronic kidney disease) stage 2, GFR 60-89 ml/min    Discharge Instructions   Allergies as of 08/21/2022       Reactions   Carvedilol Other (See Comments)   Night terrors   Amlodipine Itching   Hydralazine    Night terrors, fatigued   Valsartan    Night terrors and fatigued        Medication List     TAKE these medications    apixaban 5 MG Tabs tablet Commonly known as: ELIQUIS Take 5 mg by mouth 2 (two) times daily.   empagliflozin 25 MG Tabs tablet Commonly known as: JARDIANCE Take 0.5 tablets by mouth every morning.   furosemide 40 MG tablet Commonly known  as: LASIX Take 40 mg by mouth daily.   labetalol 100 MG tablet Commonly known as: NORMODYNE Take 1 tablet (100 mg total) by mouth 3 (three) times daily with meals.   sacubitril-valsartan 24-26 MG Commonly known as: ENTRESTO Take 1 tablet by mouth 2 (two) times daily.   spironolactone 25 MG tablet Commonly known as: ALDACTONE Take 0.5 tablets by mouth daily.   tamsulosin 0.4 MG Caps capsule Commonly known as: FLOMAX Take 0.4 mg by mouth daily after supper.        Follow-up Information     [facility]. Call.   Specialty: General Surgery Why: Have oncology refer for colorectal surgery once ready for surgical planning Contact information: [address] [place] [address] [phone]         [facility] up in 1  week(s).   Contact information: [place] [address] [phone]                Allergies  Allergen Reactions   Carvedilol Other (See Comments)    Night terrors   Amlodipine Itching   Hydralazine     Night terrors, fatigued   Valsartan     Night terrors and fatigued    Consultations: GI and general surgery and oncology   Procedures/Studies: CT Angio Chest/Abd/Pel for Dissection W and/or Wo Contrast  Result Date: 08/18/2022 CLINICAL DATA:  Acute aortic syndrome, diarrhea. History of aortic dissection status post repair. EXAM: CT ANGIOGRAPHY CHEST, ABDOMEN AND PELVIS TECHNIQUE: Non-contrast CT of the chest was initially obtained. Multidetector CT imaging through the chest, abdomen and pelvis was performed using the standard protocol during bolus administration of intravenous contrast. Multiplanar reconstructed images and MIPs were obtained and reviewed to evaluate the vascular anatomy. RADIATION DOSE REDUCTION: This exam was performed according to the departmental dose-optimization program which includes automated exposure control, adjustment of the mA and/or kV  according to patient size and/or use of iterative reconstruction technique. CONTRAST:  12m OMNIPAQUE IOHEXOL 350 MG/ML SOLN COMPARISON:  04/11/2022, 07/02/2020 FINDINGS: CTA CHEST FINDINGS Cardiovascular: The ascending aorta demonstrates stable dilation measuring 4.1 cm in greatest dimension. No intramural hematoma, dissection, or aneurysm. Endovascular stent graft repair of the descending thoracic aorta has been performed with the proximal landing zone just distal to the left subclavian artery with overlapping stents extending to the diaphragmatic hiatus. No aneurysm or endoleak. Mild coronary artery calcification within the left anterior descending coronary artery. Global cardiac size is within normal limits. No pericardial effusion. Central pulmonary arteries are of normal caliber. No intraluminal filling defect identified through the segmental level to suggest acute pulmonary embolism. Mediastinum/Nodes: 19 mm right thyroid nodule, not well characterized on this examination. No pathologic thoracic adenopathy. Esophagus unremarkable. Lungs/Pleura: Subtle tree-in-bud peribronchial nodularity has developed within the right upper lobe, possibly infectious in the acute setting. No confluent pulmonary infiltrate. Bronchial wall thickening is again seen in keeping with airway inflammation. No central obstructing lesion. No pneumothorax or pleural effusion. Musculoskeletal: No acute bone abnormality. No lytic or blastic bone lesion. Review of the MIP images confirms the above findings. CTA ABDOMEN AND PELVIS FINDINGS VASCULAR Aorta: Normal caliber aorta without aneurysm, dissection, vasculitis or significant stenosis. Mild atherosclerotic calcification. Celiac: Variant anatomy with these celiac axis given rise only to the left gastric and splenic arteries. Widely patent. No aneurysm or dissection. SMA: Replaced common hepatic artery. Widely patent. No aneurysm or dissection. Renals: Single right renal artery  demonstrates mild mixed atherosclerotic plaque within the mid segment resulting in a focal 50% stenosis in this location. Dual left renal arteries are widely patent. Normal vascular morphology. No aneurysm or dissection. IMA: Widely patent Inflow: Patent without evidence of aneurysm, dissection, vasculitis or significant stenosis. Veins: No obvious venous abnormality within the limitations of this arterial phase study. Review of the MIP images confirms the above findings. NON-VASCULAR Hepatobiliary: Stable simple cortical cyst within the left hepatic lobe. No enhancing intrahepatic mass. No intra or extrahepatic biliary ductal dilation. Gallbladder unremarkable. Pancreas: Unremarkable. No pancreatic ductal dilatation or surrounding inflammatory changes. Spleen: Unremarkable Adrenals/Urinary Tract: Stable exophytic simple cyst arising from the lateral aspect of the interpolar region of the right kidney better characterized on prior examinations. No follow-up imaging is recommended for this lesion. The kidneys are otherwise unremarkable. Adrenal glands are unremarkable. Bladder unremarkable. Stomach/Bowel: There is asymmetric left lateral  rectal wall thickening involving the low rectum, best seen at axial image # 278/7 with punctate foci of extraluminal gas identified and extensive soft tissue infiltration within the left perirectal soft tissues. This may represent inflammatory changes secondary to traumatic or inflammatory ulceration/perforation or a rectal mass with transmural extension into the perirectal soft tissues. There is superimposed circumferential bowel wall thickening involving the rectosigmoid colon proximally in keeping with proctocolitis. There is moderate volume stool within the colon proximally in keeping with a partial large bowel obstruction. The stomach, small bowel, and large bowel are otherwise unremarkable. No free intraperitoneal gas or fluid. Lymphatic: No pathologic adenopathy within the  abdomen and pelvis. Reproductive: The prostate gland is not enlarged. Seminal vesicles are unremarkable. Inflammatory changes involving the rectum abut the left posterior lobe of the prostate gland. Other: No abdominal wall hernia. Musculoskeletal: Degenerative changes are seen within the lumbar spine. No acute bone abnormality. No lytic or blastic bone lesion. Review of the MIP images confirms the above findings. IMPRESSION: 1. Status post endovascular stent graft repair of the descending thoracic aorta. No aneurysm or endoleak. 2. Stable 4.1 cm ascending aortic aneurysm. Recommend annual imaging followup by CTA or MRA. This recommendation follows 2010 ACCF/AHA/AATS/ACR/ASA/SCA/SCAI/SIR/STS/SVM Guidelines for the Diagnosis and Management of Patients with Thoracic Aortic Disease. Circulation. 2010; [id number]. Aortic aneurysm NOS (ICD10-I71.9) 3. Mild coronary artery calcification. 4. 19 mm right thyroid nodule, not well characterized on this examination. 5. Interval development of mild tree-in-bud peribronchial nodularity within the right upper lobe, possibly infectious in the acute setting. 6. Asymmetric left lateral rectal wall thickening involving the low rectum with punctate foci of extraluminal gas and extensive soft tissue infiltration within the left perirectal soft tissues. This may represent inflammatory changes secondary to traumatic or inflammatory ulceration/perforation or a rectal mass with transmural extension into the perirectal soft tissues. Endoscopic correlation is recommended. Superimposed proximal proctocolitis and resultant partial large bowel obstruction with moderate colonic stool burden. Aortic Atherosclerosis (ICD10-I70.0). Electronically Signed   By: Fidela Salisbury M.D.   On: 08/18/2022 19:29     Discharge Exam: Vitals:   08/20/22 2159 08/21/22 0537  BP: (!) 142/82 (!) 152/81  Pulse: 92 86  Resp: 20 17  Temp: 98 F (36.7 C) 98 F (36.7 C)  SpO2: 98% 99%   Vitals:    08/20/22 0945 08/20/22 1543 08/20/22 2159 08/21/22 0537  BP: 139/74 132/87 (!) 142/82 (!) 152/81  Pulse: 91 87 92 86  Resp: '17  20 17  '$ Temp: 97.8 F (36.6 C) 98.7 F (37.1 C) 98 F (36.7 C) 98 F (36.7 C)  TempSrc: Oral Oral Oral Oral  SpO2: 98% 97% 98% 99%  Weight:      Height:        General: Pt is alert, awake, not in acute distress Cardiovascular: RRR, S1/S2 +, no rubs, no gallops Respiratory: CTA bilaterally, no wheezing, no rhonchi Abdominal: Soft, NT, ND, bowel sounds + Extremities: no edema, no cyanosis    The results of significant diagnostics from this hospitalization (including imaging, microbiology, ancillary and laboratory) are listed below for reference.     Microbiology: Recent Results (from the past 240 hour(s))  Resp panel by RT-PCR (RSV, Flu A&B, Covid) Anterior Nasal Swab     Status: None   Collection Time: 08/18/22 12:56 PM   Specimen: Anterior Nasal Swab  Result Value Ref Range Status   SARS Coronavirus 2 by RT PCR NEGATIVE NEGATIVE Final   Influenza A by PCR NEGATIVE NEGATIVE Final  Influenza B by PCR NEGATIVE NEGATIVE Final    Comment: (NOTE) The Xpert Xpress SARS-CoV-2/FLU/RSV plus assay is intended as an aid in the diagnosis of influenza from Nasopharyngeal swab specimens and should not be used as a sole basis for treatment. Nasal washings and aspirates are unacceptable for Xpert Xpress SARS-CoV-2/FLU/RSV testing.  Fact Sheet for Patients: EntrepreneurPulse.com.au  Fact Sheet for Healthcare Providers: IncredibleEmployment.be  This test is not yet approved or cleared by the [place] FDA and has been authorized for detection and/or diagnosis of SARS-CoV-2 by FDA under an Emergency Use Authorization (EUA). This EUA will remain in effect (meaning this test can be used) for the duration of the COVID-19 declaration under Section 564(b)(1) of the Act, 21 U.S.C. section 360bbb-3(b)(1), unless the  authorization is terminated or revoked.     Resp Syncytial Virus by PCR NEGATIVE NEGATIVE Final    Comment: (NOTE) Fact Sheet for Patients: EntrepreneurPulse.com.au  Fact Sheet for Healthcare Providers: IncredibleEmployment.be  This test is not yet approved or cleared by the [place] FDA and has been authorized for detection and/or diagnosis of SARS-CoV-2 by FDA under an Emergency Use Authorization (EUA). This EUA will remain in effect (meaning this test can be used) for the duration of the COVID-19 declaration under Section 564(b)(1) of the Act, 21 U.S.C. section 360bbb-3(b)(1), unless the authorization is terminated or revoked.  Performed at [facility], [place] [address], [place], [place] [address]   Respiratory (~20 pathogens) panel by PCR     Status: None   Collection Time: 08/18/22 10:51 PM   Specimen: Nasopharyngeal Swab; Respiratory  Result Value Ref Range Status   Adenovirus NOT DETECTED NOT DETECTED Final   Coronavirus 229E NOT DETECTED NOT DETECTED Final    Comment: (NOTE) The Coronavirus on the Respiratory Panel, DOES NOT test for the novel  Coronavirus (2019 nCoV)    Coronavirus HKU1 NOT DETECTED NOT DETECTED Final   Coronavirus NL63 NOT DETECTED NOT DETECTED Final   Coronavirus OC43 NOT DETECTED NOT DETECTED Final   Metapneumovirus NOT DETECTED NOT DETECTED Final   Rhinovirus / Enterovirus NOT DETECTED NOT DETECTED Final   Influenza A NOT DETECTED NOT DETECTED Final   Influenza B NOT DETECTED NOT DETECTED Final   Parainfluenza Virus 1 NOT DETECTED NOT DETECTED Final   Parainfluenza Virus 2 NOT DETECTED NOT DETECTED Final   Parainfluenza Virus 3 NOT DETECTED NOT DETECTED Final   Parainfluenza Virus 4 NOT DETECTED NOT DETECTED Final   Respiratory Syncytial Virus NOT DETECTED NOT DETECTED Final   Bordetella pertussis NOT DETECTED NOT DETECTED Final   Bordetella Parapertussis NOT DETECTED NOT DETECTED Final    Chlamydophila pneumoniae NOT DETECTED NOT DETECTED Final   Mycoplasma pneumoniae NOT DETECTED NOT DETECTED Final    Comment: Performed at [facility], [place]. [address] [place], [place] [address]     Labs: BNP (last 3 results) Recent Labs    04/11/22 1813  BNP 0000000*   Basic Metabolic Panel: Recent Labs  Lab 08/18/22 1305 08/18/22 2142 08/19/22 0328 08/21/22 0320  NA 136  --  139 140  K 3.8  --  3.8 3.4*  CL 104  --  108 106  CO2 20*  --  20* 23  GLUCOSE 112*  --  104* 116*  BUN 22  --  21 16  CREATININE 1.34*  --  1.12 1.10  CALCIUM 9.1  --  8.2* 8.4*  MG 2.4  --   --   --   PHOS  --  2.9  --   --    Liver Function Tests: Recent Labs  Lab 08/18/22 1305 08/18/22 2142 08/19/22 0328  AST '22 25 15  '$ ALT '17 15 13  '$ ALKPHOS 71 68 60  BILITOT 1.1 1.2 1.1  PROT 6.7 6.9 5.6*  ALBUMIN 3.7 3.3* 2.9*   Recent Labs  Lab 08/18/22 1305  LIPASE 27   No results for input(s): "AMMONIA" in the last 168 hours. CBC: Recent Labs  Lab 08/18/22 1305 08/19/22 0328 08/21/22 0320  WBC 12.7* 7.7 7.6  NEUTROABS 10.1*  --  5.4  HGB 12.6* 10.6* 10.5*  HCT 37.0* 31.4* 31.2*  MCV 96.1 96.3 97.2  PLT 364 296 300   Cardiac Enzymes: Recent Labs  Lab 08/18/22 2142  CKTOTAL 119   BNP: Invalid input(s): "POCBNP" CBG: No results for input(s): "GLUCAP" in the last 168 hours. D-Dimer No results for input(s): "DDIMER" in the last 72 hours. Hgb A1c No results for input(s): "HGBA1C" in the last 72 hours. Lipid Profile No results for input(s): "CHOL", "HDL", "LDLCALC", "TRIG", "CHOLHDL", "LDLDIRECT" in the last 72 hours. Thyroid function studies Recent Labs    08/18/22 2142  TSH 1.833   Anemia work up Recent Labs    08/18/22 2142  VITAMINB12 514  FOLATE 32.1  FERRITIN 18*  TIBC 377  IRON 87  RETICCTPCT 1.0   Urinalysis    Component Value Date/Time   COLORURINE COLORLESS (A) 01/29/2020 0712   APPEARANCEUR CLEAR 01/29/2020 0712   LABSPEC 1.003 (L) 01/29/2020  0712   PHURINE 7.0 01/29/2020 0712   GLUCOSEU NEGATIVE 01/29/2020 0712   HGBUR MODERATE (A) 01/29/2020 0712   BILIRUBINUR NEGATIVE 01/29/2020 0712   KETONESUR NEGATIVE 01/29/2020 0712   PROTEINUR NEGATIVE 01/29/2020 0712   UROBILINOGEN 0.2 04/03/2013 1125   NITRITE NEGATIVE 01/29/2020 0712   LEUKOCYTESUR NEGATIVE 01/29/2020 0712   Sepsis Labs Recent Labs  Lab 08/18/22 1305 08/19/22 0328 08/21/22 0320  WBC 12.7* 7.7 7.6   Microbiology Recent Results (from the past 240 hour(s))  Resp panel by RT-PCR (RSV, Flu A&B, Covid) Anterior Nasal Swab     Status: None   Collection Time: 08/18/22 12:56 PM   Specimen: Anterior Nasal Swab  Result Value Ref Range Status   SARS Coronavirus 2 by RT PCR NEGATIVE NEGATIVE Final   Influenza A by PCR NEGATIVE NEGATIVE Final   Influenza B by PCR NEGATIVE NEGATIVE Final    Comment: (NOTE) The Xpert Xpress SARS-CoV-2/FLU/RSV plus assay is intended as an aid in the diagnosis of influenza from Nasopharyngeal swab specimens and should not be used as a sole basis for treatment. Nasal washings and aspirates are unacceptable for Xpert Xpress SARS-CoV-2/FLU/RSV testing.  Fact Sheet for Patients: EntrepreneurPulse.com.au  Fact Sheet for Healthcare Providers: IncredibleEmployment.be  This test is not yet approved or cleared by the [place] FDA and has been authorized for detection and/or diagnosis of SARS-CoV-2 by FDA under an Emergency Use Authorization (EUA). This EUA will remain in effect (meaning this test can be used) for the duration of the COVID-19 declaration under Section 564(b)(1) of the Act, 21 U.S.C. section 360bbb-3(b)(1), unless the authorization is terminated or revoked.     Resp Syncytial Virus by PCR NEGATIVE NEGATIVE Final    Comment: (NOTE) Fact Sheet for Patients: EntrepreneurPulse.com.au  Fact Sheet for Healthcare  Providers: IncredibleEmployment.be  This test is not yet approved or cleared by the [place] FDA and has been authorized for detection and/or diagnosis of SARS-CoV-2 by FDA under an Emergency Use Authorization (  EUA). This EUA will remain in effect (meaning this test can be used) for the duration of the COVID-19 declaration under Section 564(b)(1) of the Act, 21 U.S.C. section 360bbb-3(b)(1), unless the authorization is terminated or revoked.  Performed at [facility], [place] [address] [place], [place] [address]   Respiratory (~20 pathogens) panel by PCR     Status: None   Collection Time: 08/18/22 10:51 PM   Specimen: Nasopharyngeal Swab; Respiratory  Result Value Ref Range Status   Adenovirus NOT DETECTED NOT DETECTED Final   Coronavirus 229E NOT DETECTED NOT DETECTED Final    Comment: (NOTE) The Coronavirus on the Respiratory Panel, DOES NOT test for the novel  Coronavirus (2019 nCoV)    Coronavirus HKU1 NOT DETECTED NOT DETECTED Final   Coronavirus NL63 NOT DETECTED NOT DETECTED Final   Coronavirus OC43 NOT DETECTED NOT DETECTED Final   Metapneumovirus NOT DETECTED NOT DETECTED Final   Rhinovirus / Enterovirus NOT DETECTED NOT DETECTED Final   Influenza A NOT DETECTED NOT DETECTED Final   Influenza B NOT DETECTED NOT DETECTED Final   Parainfluenza Virus 1 NOT DETECTED NOT DETECTED Final   Parainfluenza Virus 2 NOT DETECTED NOT DETECTED Final   Parainfluenza Virus 3 NOT DETECTED NOT DETECTED Final   Parainfluenza Virus 4 NOT DETECTED NOT DETECTED Final   Respiratory Syncytial Virus NOT DETECTED NOT DETECTED Final   Bordetella pertussis NOT DETECTED NOT DETECTED Final   Bordetella Parapertussis NOT DETECTED NOT DETECTED Final   Chlamydophila pneumoniae NOT DETECTED NOT DETECTED Final   Mycoplasma pneumoniae NOT DETECTED NOT DETECTED Final    Comment: Performed at [facility], [place]. [address], [place], [place] [address]     Time  coordinating discharge: Over 30 minutes  SIGNED:   Darliss Cheney, MD  [facility] 08/21/2022, 10:22 AM *Please note that this is a verbal dictation therefore any spelling or grammatical errors are due to the "[place] [facility]" system interpretation. If 7PM-7AM, please contact night-coverage [email or web address]

## 2022-08-21 NOTE — Consult Note (Signed)
Hematology/Oncology Consult Note  Clinical Summary: 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  Reason for Consult: Newly diagnosed rectal mass, consistent with rectal adenocarcinoma  HPI: [name] is a 72 year old male with medical history significant for AAA, cardiomyopathy, atrial fibrillation, and hypertension who presented with altered bowel habits.  The patient initially presented on 08/18/2022 to the emergency department.  While in the emergency department he underwent a CT angio chest abdomen pelvis which showed an asymmetric left lateral rectal wall thickening involving the low rectum with punctate foci of extraluminal gas and extensive soft tissue infiltration.  Findings were concerning for colorectal cancer.  Gastroenterology was consulted and performed a flexible sigmoidoscopy on 08/19/2022.  The procedure revealed a partially obstructive rectal mass, biopsies were obtained.  Surgery was consulted who did not recommend any acute surgical intervention, recommended pelvic MRI as well as maintaining soft stools.  Findings most consistent with rectal adenocarcinoma.  Due to concern for these findings oncology was consulted for further evaluation and management.  On exam today [name] is accompanied by his wife.  Orts that while he was taking Eliquis therapy he did have blood in his stool but has not had much in the way of blood in his stool recently.  He notes that he does have pain, discomfort, and "build up gas".  He has not been suffering from any nausea or vomiting.  He reports that overall he feels good today and has been able to tolerate p.o. intake.  He is currently cared for by the [facility] but is interested in establishing care with a [facility].  He is from [place] but notes that he would like to have any and all treatments required for  cancer performed locally.  He notes that overall he feels well enough for discharge.  He is not having any fevers, chills, sweats, shortness of breath, or chest pain.  A full 10 point ROS was otherwise negative  The bulk of our discussion focused on the likely diagnosis of localized colorectal cancer.  At this time findings are most consistent with a rectal adenocarcinoma and I would recommend pursuing total neoadjuvant chemotherapy treatment.  Patient will require pelvic MRI in order to appropriately stage and will need port placement.  Fortunately he is not fully obstructing of and surgery is not required at this time, however provided strict return precautions in the event he were to develop worsening constipation, distention, or pain.  The patient is wife voiced understanding of the plan moving forward.  O:  Vitals:   08/20/22 2159 08/21/22 0537  BP: (!) 142/82 (!) 152/81  Pulse: 92 86  Resp: 20 17  Temp: 98 F (36.7 C) 98 F (36.7 C)  SpO2: 98% 99%      Latest Ref Rng & Units 08/21/2022    3:20 AM 08/19/2022    3:28 AM 08/18/2022    9:42 PM  CMP  Glucose 70 - 99 mg/dL 116  104    BUN 8 - 23 mg/dL 16  21    Creatinine 0.61 - 1.24 mg/dL 1.10  1.12    Sodium 135 - 145 mmol/L 140  139    Potassium 3.5 - 5.1 mmol/L 3.4  3.8    Chloride 98 - 111 mmol/L 106  108    CO2 22 - 32 mmol/L 23  20    Calcium 8.9 - 10.3  mg/dL 8.4  8.2    Total Protein 6.5 - 8.1 g/dL  5.6  6.9   Total Bilirubin 0.3 - 1.2 mg/dL  1.1  1.2   Alkaline Phos 38 - 126 U/L  60  68   AST 15 - 41 U/L  15  25   ALT 0 - 44 U/L  13  15       Latest Ref Rng & Units 08/21/2022    3:20 AM 08/19/2022    3:28 AM 08/18/2022    1:05 PM  CBC  WBC 4.0 - 10.5 K/uL 7.6  7.7  12.7   Hemoglobin 13.0 - 17.0 g/dL 10.5  10.6  12.6   Hematocrit 39.0 - 52.0 % 31.2  31.4  37.0   Platelets 150 - 400 K/uL 300  296  364       GENERAL: well appearing elderly Caucasian male in NAD  SKIN: skin color, texture, turgor are normal, no rashes  or significant lesions EYES: conjunctiva are pink and non-injected, sclera clear LUNGS: clear to auscultation and percussion with normal breathing effort HEART: regular rate & rhythm and no murmurs and no lower extremity edema Musculoskeletal: no cyanosis of digits and no clubbing  PSYCH: alert & oriented x 3, fluent speech NEURO: no focal motor/sensory deficits  Assessment/Plan:  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  # Localized Rectal Mass, Consistent with Rectal Adenocarcinoma -- Follow-up final pathology, likely diagnoses rectal adenocarcinoma -- Agree with pelvic MRI per recommendations of surgery. This will complete his staging. Underwent CT C/A/P with no evidence of metastatic disease in hospital.  --CEA in process.  -- Maintain loose stools with stool softeners and MiraLAX -- Patient will require port placement  -- Patient to be discussed at [facility] conference, at this time likely plan is total neoadjuvant treatment with chemotherapy, followed by chemoradiation, followed by surgery --RTC pending final staging and port placement.    Ledell Peoples, MD Department of Hematology/Oncology [place] at [facility] Phone: [phone] Pager: [phone] Email: [name]

## 2022-08-21 NOTE — Progress Notes (Signed)
Pt is being discharged. IV removed, tele box removed. Discharge instructions explained and all questions answered.   Scott [name] Scott [name]

## 2022-08-21 NOTE — Progress Notes (Signed)
Pt also stated that he has not had a normal BM in about a month. Only smears while in hospital so far. RN made MD aware.   Scott [name] Mihir Flanigan

## 2022-08-21 NOTE — Progress Notes (Signed)
RN just did soap sud enema and everything that came out was clear from rectum. Also did suppository and finger was clean after. Pt and family are concerned about pt being discharged and not having a BM first. RN messaged MD on call to make aware and for further instructions.   Scott [name] Scott [name]

## 2022-08-22 ENCOUNTER — Encounter (HOSPITAL_COMMUNITY): Payer: Self-pay | Admitting: Internal Medicine

## 2022-08-23 ENCOUNTER — Other Ambulatory Visit: Payer: Self-pay | Admitting: Hematology and Oncology

## 2022-08-23 DIAGNOSIS — K6289 Other specified diseases of anus and rectum: Secondary | ICD-10-CM

## 2022-08-23 LAB — SURGICAL PATHOLOGY

## 2022-08-24 ENCOUNTER — Other Ambulatory Visit: Payer: Self-pay | Admitting: Physician Assistant

## 2022-08-24 DIAGNOSIS — K6289 Other specified diseases of anus and rectum: Secondary | ICD-10-CM

## 2022-08-24 NOTE — Progress Notes (Signed)
I left vm for [name] asking for a return call.

## 2022-08-25 ENCOUNTER — Ambulatory Visit (HOSPITAL_COMMUNITY)
Admission: RE | Admit: 2022-08-25 | Discharge: 2022-08-25 | Disposition: A | Discharge status: Home / Self Care | Payer: Medicare Other | Source: Ambulatory Visit | Attending: Physician Assistant | Admitting: Physician Assistant

## 2022-08-25 DIAGNOSIS — K6289 Other specified diseases of anus and rectum: Secondary | ICD-10-CM | POA: Diagnosis not present

## 2022-08-25 DIAGNOSIS — C61 Malignant neoplasm of prostate: Secondary | ICD-10-CM | POA: Diagnosis not present

## 2022-08-25 NOTE — Progress Notes (Unsigned)
I returned [name]'s call and brought her up to date.  I let her know the biopsy results were nondiagnostic.  That it showed high grade dysplasia.  I explained what that ment and that he will need to have a repeat biopsy.  I told her that [name] PA-C is working to get this arranged.  I explained to her that it may require a surgical excision to get more tissue than can be obtained via sigmoidoscopy.  She states her dad does not want to get a port until he has had a discussion regarding diagnosis and treatment. I told [name] I will call her back with a f/u appt date and time.  All questions were answered.  She verbalized understanding.

## 2022-08-26 NOTE — Progress Notes (Signed)
I spoke with Mr Vandemark's daughter [name].  I let her know [facility] will be reaching out to him to scheduled a flex sigmoidoscopy to re biopsy the rectal mass.  I told her once that is scheduled I will make a f/u appt for Mr Cardoni to see Dr Loma Messing in medical oncology. She stated she will let Mr Reichert know. All questions were answered.  She verbalized understanding.

## 2022-08-27 ENCOUNTER — Telehealth: Payer: Self-pay | Admitting: Internal Medicine

## 2022-08-27 DIAGNOSIS — K6289 Other specified diseases of anus and rectum: Secondary | ICD-10-CM

## 2022-08-27 NOTE — Telephone Encounter (Signed)
The pt's daughter has been advised that the pt has been scheduled for Flex on 3/13. All instructions discussed and sent to My Chart.  Of note the pt is NOT taking Eliquis at this time.

## 2022-08-27 NOTE — Telephone Encounter (Signed)
PT daughter is calling about scheduling an emergency EGD per Dr. Lorenso Courier (oncology). Please advise.

## 2022-08-27 NOTE — Telephone Encounter (Signed)
RE: Nondiagnostic pathology Received: Idamae Lusher, MD  Sharyn Creamer, MD; Cordelia Poche Cc: Ileana Roup, MD; Royston Bake, RN; Orson Slick, MD; Greggory Keen, LPN If the consensus at multidisciplinary conference is that further tissue was needed, I can also be available to do a repeat sigmoidoscopy on him.  Let us know how he would like to proceed.  Wilfrid Lund       Previous Messages    ----- Message ----- From: Sharyn Creamer, MD Sent: 08/26/2022   9:01 AM EST To: Ileana Roup, MD; * Subject: RE: Nondiagnostic pathology                    Hi everyone, sincere apologies for not getting a confirmed diagnosis of malignancy on my last scope. Beth, I am willing to offer the patient a flexible sigmoidoscopy in the [facility], any time slot (including 7:30 AM time slots) would be available to fit him in. Please communicate my sincere apologies to him. Oncology feels that additional biopsies would be beneficial for malignancy staining (such as MSI) so I would try my best to be able to get him a confirmed malignancy diagnosis ----- Message ----- From: Ileana Roup, MD Sent: 08/25/2022   2:15 PM EST To: Doran Stabler, MD; Royston Bake, RN; * Subject: RE: Nondiagnostic pathology                    I would discuss with medical oncology; certainly seems consistent with a rectal malignancy but understand tissue diagnosis. According to epic has pelvic MRI today. If further tissue acquisition is necessary, would advocate to be done endoscopically given location  ----- Message ----- From: Cordelia Poche Sent: 08/24/2022   3:07 PM EST To: Ileana Roup, MD; Royston Bake, RN; * Subject: FW: Nondiagnostic pathology                    Dr. Dema Severin,  Routing this message to your for your input. I did not get a response back from Dr. Kieth Brightly. Please advise on repeat biopsy for this patient.  Thanks, Murray Hodgkins ----- Message  ----- From: Doran Stabler, MD Sent: 08/23/2022   3:23 PM EST To: Mickeal Skinner, MD; Royston Bake, RN; * Subject: RE: Nondiagnostic pathology                    Thank you for the note.  I did this patient's initial hospital consultation, but his procedure and subsequent GI care were with my partner Dr. Christia Reading. I just reviewed the sigmoidoscopy report, and I think these were the best possible pinch biopsies that could be obtained from a large firm and malignant appearing partially obstructing mass. I understand your question and concern about treating for malignancy without demonstrating adenocarcinoma the biopsies.  However, I think our surgical colleagues would agree this entire clinical picture looks like a rectal adenocarcinoma that is at least locally advanced.  Pelvic MRI reportedly scheduled. No current plans for repeat sigmoidoscopy, as I do not expect further biopsies from likely able to be more revealing.  Wilfrid Lund, [facility] ----- Message ----- From: Cordelia Poche Sent: 08/23/2022   2:27 PM EST To: Mickeal Skinner, MD; * Subject: Nondiagnostic pathology  Dr. Loletha Carrow and Dr. Kieth Brightly,  Patient's rectal mass biopsy was finalized and non-diagnostic, "Superficial fragments of an adenoma with high-grade dysplasia".  I wanted to reach out to you both to see next steps including repeat flex sig with re-biopsy versus surgical biopsy.  Appreciate your input.  Thanks, [name] PA-C

## 2022-08-30 ENCOUNTER — Telehealth: Payer: Self-pay

## 2022-08-30 ENCOUNTER — Other Ambulatory Visit: Payer: Self-pay | Admitting: Physician Assistant

## 2022-08-30 MED ORDER — HYDROCODONE-ACETAMINOPHEN 5-325 MG PO TABS: 1.0000 | ORAL_TABLET | Freq: Four times a day (QID) | ORAL | 0 refills | Status: DC | PRN

## 2022-08-30 NOTE — Telephone Encounter (Signed)
T/C from pt daughter, [name], stating pt is C/o lower abd pain and back, pain scale at 4-5, taking miralax as needed and is eating a soft low fiber diet. no drug allergy to any pain meds has taken hydrocodone in past that worked well for him. He is not currently taking anything for pain.  Referred to [name], PA to send in prescription for pain medication to [facility].

## 2022-08-31 NOTE — Progress Notes (Signed)
I spoke with Mr Grogan's daughter [name] and let her know I have schedule him for f/u with Loma Messing on 3/20 at 1140.  She states the hydrocodone is not helping his pain.  He has taken it 3 times and it does not helping his pain.  She is asking for something stronger.  I will forward this request to [name], PA-C

## 2022-09-01 ENCOUNTER — Encounter: Payer: Self-pay | Admitting: Internal Medicine

## 2022-09-01 ENCOUNTER — Other Ambulatory Visit: Payer: Self-pay | Admitting: Hematology and Oncology

## 2022-09-01 ENCOUNTER — Ambulatory Visit (AMBULATORY_SURGERY_CENTER): Payer: Medicare Other | Admitting: Internal Medicine

## 2022-09-01 VITALS — BP 170/92 | HR 94 | Temp 96.6°F | Resp 10 | Ht 66.0 in | Wt 167.5 lb

## 2022-09-01 DIAGNOSIS — C189 Malignant neoplasm of colon, unspecified: Secondary | ICD-10-CM

## 2022-09-01 DIAGNOSIS — K6289 Other specified diseases of anus and rectum: Secondary | ICD-10-CM

## 2022-09-01 MED ORDER — OXYCODONE HCL 5 MG PO TABS: 5.0000 mg | ORAL_TABLET | ORAL | 0 refills | Status: DC | PRN

## 2022-09-01 MED ORDER — SODIUM CHLORIDE 0.9 % IV SOLN: 500.0000 mL | Freq: Once | INTRAVENOUS | Status: DC

## 2022-09-01 NOTE — Progress Notes (Signed)
Report to PACU, RN, vss, BBS= Clear.  

## 2022-09-01 NOTE — Progress Notes (Signed)
Called to room to assist during endoscopic procedure.  Patient ID and intended procedure confirmed with present staff. Received instructions for my participation in the procedure from the performing physician.  

## 2022-09-01 NOTE — Op Note (Signed)
[name] Procedure Date: 09/01/2022 8:53 AM MRN: [id number] Endoscopist: Adline Mango [place] , , [id number] Age: 72 Referring MD:  Date of Birth: Feb 21, 1951 Gender: Male Account #: [id number] Procedure:                Flexible Sigmoidoscopy Indications:              Abnormal CT of the GI tract Medicines:                None Procedure:                Pre-Anesthesia Assessment:                           - Prior to the procedure, a History and Physical                            was performed, and patient medications and                            allergies were reviewed. The patient's tolerance of                            previous anesthesia was also reviewed. The risks                            and benefits of the procedure and the sedation                            options and risks were discussed with the patient.                            All questions were answered, and informed consent                            was obtained. Prior Anticoagulants: The patient has                            taken no anticoagulant or antiplatelet agents. ASA                            Grade Assessment: III - A patient with severe                            systemic disease. After reviewing the risks and                            benefits, the patient was deemed in satisfactory                            condition to undergo the procedure.                           After obtaining informed consent, the scope was  passed under direct vision. The Olympus Scope                            [id number] was introduced through the anus and                            advanced to the the sigmoid colon. The flexible                            sigmoidoscopy was accomplished without difficulty.                            The patient tolerated the procedure well. Scope In: Scope Out: Findings:                 Extensive amounts of stool was found  in the sigmoid                            colon, interfering with visualization.                           A frond-like/villous, fungating and infiltrative                            partially obstructing large mass was found in the                            rectum. The mass was circumferential. The mass                            measured five cm in length. Biopsies were taken                            with a cold forceps for histology.                           A tattoo was seen in the rectum. Complications:            No immediate complications. Estimated Blood Loss:     Estimated blood loss was minimal. Impression:               - Stool in the sigmoid colon.                           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 Biopsied.                           - A tattoo was seen in the rectum. Recommendation:           - Discharge patient to home (with escort).                           - Await pathology results.                           -  The findings and recommendations were discussed                            with the patient. Dr Georgian Co "Lyndee Leo" Lorenso Courier,  09/01/2022 9:12:46 AM

## 2022-09-01 NOTE — Progress Notes (Signed)
GASTROENTEROLOGY PROCEDURE H&P NOTE   Primary Care Physician: [facility]    Reason for Procedure:   Rectal mass  Plan:    Flexible sigmoidoscopy  Patient is appropriate for endoscopic procedure(s) in the ambulatory ([facility] [facility]) setting.  The nature of the procedure, as well as the risks, benefits, and alternatives were carefully and thoroughly reviewed with the patient. Ample time for discussion and questions allowed. The patient understood, was satisfied, and agreed to proceed.     HPI: Scott [name] is a 72 y.o. male who presents for flexible sigmoidoscopy for rectal mass.   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    Past Surgical History:  Procedure Laterality Date   ABDOMINAL AORTIC ENDOVASCULAR STENT GRAFT  02/02/2020    type b   APPENDECTOMY     BIOPSY  08/19/2022   Procedure: BIOPSY;  Surgeon: Sharyn Creamer, MD;  Location: [facility];  Service: Gastroenterology;;   Otho Darner SIGMOIDOSCOPY N/A 08/19/2022   Procedure: Beryle Quant;  Surgeon: Sharyn Creamer, MD;  Location: [facility];  Service: Gastroenterology;  Laterality: N/A;   SUBMUCOSAL TATTOO INJECTION  08/19/2022   Procedure: SUBMUCOSAL TATTOO INJECTION;  Surgeon: Sharyn Creamer, MD;  Location: [facility] ENDOSCOPY;  Service: Gastroenterology;;   THORACIC AORTIC ENDOVASCULAR STENT GRAFT N/A 02/02/2020   Procedure: REPAIR TYPE B THORACIC AORTIC ENDOVASCULAR STENT;  Surgeon: [name], MD;  Location: [facility] OR;  Service: Vascular;  Laterality: N/A;    Prior to Admission medications   Medication Sig Start Date End Date Taking? Authorizing Provider  apixaban (ELIQUIS) 5 MG TABS tablet Take 5 mg by mouth 2 (two) times daily. Patient not taking: Reported on 08/19/2022 04/09/22   [provider]  empagliflozin (JARDIANCE) 25 MG TABS tablet Take 0.5 tablets  by mouth every morning. Patient not taking: Reported on 08/19/2022 07/28/22   [provider]  furosemide (LASIX) 40 MG tablet Take 40 mg by mouth daily. Patient not taking: Reported on 08/19/2022 07/28/22   [provider]  HYDROcodone-acetaminophen (NORCO) 5-325 MG tablet Take 1 tablet by mouth every 6 (six) hours as needed for moderate pain. 08/30/22   Lincoln Brigham, PA-C  labetalol (NORMODYNE) 100 MG tablet Take 1 tablet (100 mg total) by mouth 3 (three) times daily with meals. Patient not taking: Reported on 08/19/2022 08/26/21   Fenton, Clint R, PA  sacubitril-valsartan (ENTRESTO) 24-26 MG Take 1 tablet by mouth 2 (two) times daily. Patient not taking: Reported on 08/19/2022 07/28/22   [provider]  spironolactone (ALDACTONE) 25 MG tablet Take 0.5 tablets by mouth daily. Patient not taking: Reported on 08/19/2022 07/28/22   [provider]  tamsulosin (FLOMAX) 0.4 MG CAPS capsule Take 0.4 mg by mouth daily after supper. Patient not taking: Reported on 08/19/2022 03/12/22   [provider]    Current Outpatient Medications  Medication Sig Dispense Refill   HYDROcodone-acetaminophen (NORCO) 5-325 MG tablet Take 1 tablet by mouth every 6 (six) hours as needed for moderate pain. 30 tablet 0   labetalol (NORMODYNE) 100 MG tablet Take 1 tablet (100 mg total) by mouth 3 (three) times daily with meals. 90 tablet 3   apixaban (ELIQUIS) 5 MG TABS tablet Take 5 mg by mouth 2 (two) times daily. (Patient not taking: Reported on 09/01/2022)     empagliflozin (JARDIANCE) 25 MG TABS tablet Take 0.5 tablets  by mouth every morning. (Patient not taking: Reported on 08/19/2022)     furosemide (LASIX) 40 MG tablet Take 40 mg by mouth daily. (Patient not taking: Reported on 08/19/2022)     sacubitril-valsartan (ENTRESTO) 24-26 MG Take 1 tablet by mouth 2 (two) times daily. (Patient not taking: Reported on 08/19/2022)     spironolactone (ALDACTONE) 25 MG tablet Take 0.5 tablets by  mouth daily. (Patient not taking: Reported on 08/19/2022)     tamsulosin (FLOMAX) 0.4 MG CAPS capsule Take 0.4 mg by mouth daily after supper. (Patient not taking: Reported on 08/19/2022)     Current Facility-Administered Medications  Medication Dose Route Frequency Provider Last Rate Last Admin   0.9 %  sodium chloride infusion  500 mL Intravenous Once Sharyn Creamer, MD        Allergies as of 09/01/2022 - Review Complete 09/01/2022  Allergen Reaction Noted   Carvedilol Other (See Comments) 08/26/2021   Amlodipine Itching 08/26/2021   Hydralazine  03/19/2020   Valsartan  03/19/2020    Family History  Problem Relation Age of Onset   Lung cancer Sister     Social History   Socioeconomic History   Marital status: Married    Spouse name: Not on file   Number of children: 3   Years of education: Not on file   Highest education level: Not on file  Occupational History   Occupation: veteran  Tobacco Use   Smoking status: Former   Smokeless tobacco: Never   Tobacco comments:    Quit 2021  Vaping Use   Vaping Use: Never used  Substance and Sexual Activity   Alcohol use: Yes    Alcohol/week: 1.0 - 2.0 standard drink of alcohol    Types: 1 - 2 Cans of beer per week    Comment: 1-2 beers 1-2 times a week 08/26/21   Drug use: Yes    Types: Marijuana    Comment: used yesterday   Sexual activity: Not on file  Other Topics Concern   Not on file  Social History Narrative   Not on file   Social Determinants of Health   Financial Resource Strain: Not on file  Food Insecurity: No Food Insecurity (07/09/2020)   Hunger Vital Sign    Worried About Running Out of Food in the Last Year: Never true    Ran Out of Food in the Last Year: Never true  Transportation Needs: No Transportation Needs (07/09/2020)   PRAPARE - Hydrologist (Medical): No    Lack of Transportation (Non-Medical): No  Physical Activity: Not on file  Stress: Not on file  Social  Connections: Unknown (07/09/2020)   Social Connection and Isolation Panel [NHANES]    Frequency of Communication with Friends and Family: More than three times a week    Frequency of Social Gatherings with Friends and Family: More than three times a week    Attends Religious Services: 1 to 4 times per year    Active Member of [employer] or Organizations: No    Attends Archivist Meetings: 1 to 4 times per year    Marital Status: Patient refused  Intimate Partner Violence: Not At Risk (07/09/2020)   Humiliation, Afraid, Rape, and Kick questionnaire    Fear of Current or Ex-Partner: No    Emotionally Abused: No    Physically Abused: No    Sexually Abused: No    Physical Exam: Vital signs in last 24 hours: BP (!) 167/101  Pulse 85   Temp (!) 96.6 F (35.9 C) (Temporal)   Resp 10   Ht '5\' 6"'$  (1.676 m)   Wt 167 lb 8 oz (76 kg)   SpO2 96%   BMI 27.04 kg/m  GEN: NAD EYE: Sclerae anicteric ENT: MMM CV: Non-tachycardic Pulm: No increased work of breathing GI: Soft, NT/ND NEURO:  Alert & Oriented   Christia Reading, MD [facility]  09/01/2022 9:07 AM

## 2022-09-01 NOTE — Patient Instructions (Addendum)
Biopsies taken today, await pathology results to come back.  Resume previous diet and continue present medications.   YOU HAD AN ENDOSCOPIC PROCEDURE TODAY AT [facility]:   Refer to the procedure report that was given to you for any specific questions about what was found during the examination.  If the procedure report does not answer your questions, please call your gastroenterologist to clarify.  If you requested that your care partner not be given the details of your procedure findings, then the procedure report has been included in a sealed envelope for you to review at your convenience later.  YOU SHOULD EXPECT: Some feelings of bloating in the abdomen. Passage of more gas than usual.  Walking can help get rid of the air that was put into your GI tract during the procedure and reduce the bloating. If you had a lower endoscopy (such as a colonoscopy or flexible sigmoidoscopy) you may notice spotting of blood in your stool or on the toilet paper. If you underwent a bowel prep for your procedure, you may not have a normal bowel movement for a few days.  Please Note:  You might notice some irritation and congestion in your nose or some drainage.  This is from the oxygen used during your procedure.  There is no need for concern and it should clear up in a day or so.  SYMPTOMS TO REPORT IMMEDIATELY:  Following lower endoscopy (colonoscopy or flexible sigmoidoscopy):  Excessive amounts of blood in the stool  Significant tenderness or worsening of abdominal pains  Swelling of the abdomen that is new, acute  Fever of 100F or higher   For urgent or emergent issues, a gastroenterologist can be reached at any hour by calling [phone]. Do not use MyChart messaging for urgent concerns.    DIET:  We do recommend a small meal at first, but then you may proceed to your regular diet.  Drink plenty of fluids but you should avoid alcoholic beverages for 24 hours.  ACTIVITY:  You  should plan to take it easy for the rest of today and you should NOT DRIVE or use heavy machinery until tomorrow (because of the sedation medicines used during the test).    FOLLOW UP: Our staff will call the number listed on your records the next business day following your procedure.  We will call around 7:15- 8:00 am to check on you and address any questions or concerns that you may have regarding the information given to you following your procedure. If we do not reach you, we will leave a message.     If any biopsies were taken you will be contacted by phone or by letter within the next 1-3 weeks.  Please call us at [phone] if you have not heard about the biopsies in 3 weeks.    SIGNATURES/CONFIDENTIALITY: You and/or your care partner have signed paperwork which will be entered into your electronic medical record.  These signatures attest to the fact that that the information above on your After Visit Summary has been reviewed and is understood.  Full responsibility of the confidentiality of this discharge information lies with you and/or your care-partner.

## 2022-09-01 NOTE — Progress Notes (Signed)
Pt's states no medical or surgical changes since previsit or office visit. 

## 2022-09-02 ENCOUNTER — Telehealth: Payer: Self-pay | Admitting: Internal Medicine

## 2022-09-02 ENCOUNTER — Telehealth: Payer: Self-pay | Admitting: *Deleted

## 2022-09-02 NOTE — Telephone Encounter (Signed)
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 Will let his oncology team know so that they can move forward with treatment. Will CC Dr. Narda Rutherford with oncology, Dr. Dema Severin with surgery, and my colleague Dr. Loletha Carrow to this note.

## 2022-09-02 NOTE — Telephone Encounter (Signed)
mESSAGE LEFT TO CALL IF NECESSARY.

## 2022-09-02 NOTE — Progress Notes (Signed)
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 Will let his oncology team know so that they can move forward with treatment. Will CC Dr. Narda Rutherford with oncology, Dr. Dema Severin with surgery, and my colleague Dr. Loletha Carrow to this note.

## 2022-09-06 ENCOUNTER — Other Ambulatory Visit: Payer: Self-pay | Admitting: Hematology and Oncology

## 2022-09-06 DIAGNOSIS — R110 Nausea: Secondary | ICD-10-CM | POA: Diagnosis not present

## 2022-09-06 DIAGNOSIS — C20 Malignant neoplasm of rectum: Secondary | ICD-10-CM | POA: Diagnosis not present

## 2022-09-06 DIAGNOSIS — Z1330 Encounter for screening examination for mental health and behavioral disorders, unspecified: Secondary | ICD-10-CM | POA: Diagnosis not present

## 2022-09-06 DIAGNOSIS — K5909 Other constipation: Secondary | ICD-10-CM | POA: Diagnosis not present

## 2022-09-06 DIAGNOSIS — I480 Paroxysmal atrial fibrillation: Secondary | ICD-10-CM | POA: Diagnosis not present

## 2022-09-06 DIAGNOSIS — I7121 Aneurysm of the ascending aorta, without rupture: Secondary | ICD-10-CM | POA: Diagnosis not present

## 2022-09-07 ENCOUNTER — Emergency Department (HOSPITAL_COMMUNITY): Payer: No Typology Code available for payment source

## 2022-09-07 ENCOUNTER — Other Ambulatory Visit: Payer: Self-pay

## 2022-09-07 ENCOUNTER — Inpatient Hospital Stay (HOSPITAL_COMMUNITY)
Admission: EM | Admit: 2022-09-07 | Discharge: 2022-09-18 | DRG: 329 | Disposition: A | Discharge status: Home Health | Payer: No Typology Code available for payment source | Attending: Family Medicine | Admitting: Family Medicine

## 2022-09-07 ENCOUNTER — Encounter (HOSPITAL_COMMUNITY): Payer: Self-pay

## 2022-09-07 DIAGNOSIS — D649 Anemia, unspecified: Secondary | ICD-10-CM | POA: Insufficient documentation

## 2022-09-07 DIAGNOSIS — Z7984 Long term (current) use of oral hypoglycemic drugs: Secondary | ICD-10-CM | POA: Diagnosis not present

## 2022-09-07 DIAGNOSIS — K5669 Other partial intestinal obstruction: Secondary | ICD-10-CM | POA: Diagnosis present

## 2022-09-07 DIAGNOSIS — I130 Hypertensive heart and chronic kidney disease with heart failure and stage 1 through stage 4 chronic kidney disease, or unspecified chronic kidney disease: Secondary | ICD-10-CM | POA: Diagnosis present

## 2022-09-07 DIAGNOSIS — C189 Malignant neoplasm of colon, unspecified: Secondary | ICD-10-CM | POA: Diagnosis not present

## 2022-09-07 DIAGNOSIS — R000 Tachycardia, unspecified: Secondary | ICD-10-CM | POA: Diagnosis present

## 2022-09-07 DIAGNOSIS — Z7901 Long term (current) use of anticoagulants: Secondary | ICD-10-CM | POA: Diagnosis not present

## 2022-09-07 DIAGNOSIS — K828 Other specified diseases of gallbladder: Secondary | ICD-10-CM | POA: Diagnosis not present

## 2022-09-07 DIAGNOSIS — I10 Essential (primary) hypertension: Secondary | ICD-10-CM

## 2022-09-07 DIAGNOSIS — I739 Peripheral vascular disease, unspecified: Secondary | ICD-10-CM | POA: Insufficient documentation

## 2022-09-07 DIAGNOSIS — C19 Malignant neoplasm of rectosigmoid junction: Secondary | ICD-10-CM | POA: Diagnosis present

## 2022-09-07 DIAGNOSIS — I700 Atherosclerosis of aorta: Secondary | ICD-10-CM | POA: Diagnosis present

## 2022-09-07 DIAGNOSIS — K6389 Other specified diseases of intestine: Secondary | ICD-10-CM | POA: Diagnosis not present

## 2022-09-07 DIAGNOSIS — K56609 Unspecified intestinal obstruction, unspecified as to partial versus complete obstruction: Secondary | ICD-10-CM

## 2022-09-07 DIAGNOSIS — Z8679 Personal history of other diseases of the circulatory system: Secondary | ICD-10-CM | POA: Diagnosis not present

## 2022-09-07 DIAGNOSIS — L509 Urticaria, unspecified: Secondary | ICD-10-CM | POA: Insufficient documentation

## 2022-09-07 DIAGNOSIS — Z79899 Other long term (current) drug therapy: Secondary | ICD-10-CM

## 2022-09-07 DIAGNOSIS — I5022 Chronic systolic (congestive) heart failure: Secondary | ICD-10-CM | POA: Diagnosis present

## 2022-09-07 DIAGNOSIS — R21 Rash and other nonspecific skin eruption: Secondary | ICD-10-CM | POA: Diagnosis not present

## 2022-09-07 DIAGNOSIS — Z452 Encounter for adjustment and management of vascular access device: Secondary | ICD-10-CM | POA: Diagnosis not present

## 2022-09-07 DIAGNOSIS — R7401 Elevation of levels of liver transaminase levels: Secondary | ICD-10-CM | POA: Diagnosis not present

## 2022-09-07 DIAGNOSIS — N400 Enlarged prostate without lower urinary tract symptoms: Secondary | ICD-10-CM | POA: Diagnosis present

## 2022-09-07 DIAGNOSIS — F05 Delirium due to known physiological condition: Secondary | ICD-10-CM | POA: Diagnosis not present

## 2022-09-07 DIAGNOSIS — R140 Abdominal distension (gaseous): Secondary | ICD-10-CM | POA: Diagnosis not present

## 2022-09-07 DIAGNOSIS — I4891 Unspecified atrial fibrillation: Secondary | ICD-10-CM | POA: Diagnosis not present

## 2022-09-07 DIAGNOSIS — Z5331 Laparoscopic surgical procedure converted to open procedure: Secondary | ICD-10-CM | POA: Diagnosis not present

## 2022-09-07 DIAGNOSIS — E876 Hypokalemia: Secondary | ICD-10-CM | POA: Diagnosis not present

## 2022-09-07 DIAGNOSIS — I4819 Other persistent atrial fibrillation: Secondary | ICD-10-CM | POA: Diagnosis not present

## 2022-09-07 DIAGNOSIS — C20 Malignant neoplasm of rectum: Secondary | ICD-10-CM | POA: Diagnosis not present

## 2022-09-07 DIAGNOSIS — R0602 Shortness of breath: Secondary | ICD-10-CM | POA: Diagnosis not present

## 2022-09-07 DIAGNOSIS — N182 Chronic kidney disease, stage 2 (mild): Secondary | ICD-10-CM | POA: Diagnosis not present

## 2022-09-07 DIAGNOSIS — Z801 Family history of malignant neoplasm of trachea, bronchus and lung: Secondary | ICD-10-CM

## 2022-09-07 DIAGNOSIS — Z87891 Personal history of nicotine dependence: Secondary | ICD-10-CM

## 2022-09-07 DIAGNOSIS — K567 Ileus, unspecified: Secondary | ICD-10-CM | POA: Diagnosis not present

## 2022-09-07 DIAGNOSIS — Z91148 Patient's other noncompliance with medication regimen for other reason: Secondary | ICD-10-CM

## 2022-09-07 DIAGNOSIS — R451 Restlessness and agitation: Secondary | ICD-10-CM | POA: Diagnosis not present

## 2022-09-07 DIAGNOSIS — D630 Anemia in neoplastic disease: Secondary | ICD-10-CM | POA: Diagnosis present

## 2022-09-07 DIAGNOSIS — K5989 Other specified functional intestinal disorders: Secondary | ICD-10-CM | POA: Diagnosis not present

## 2022-09-07 DIAGNOSIS — R7989 Other specified abnormal findings of blood chemistry: Secondary | ICD-10-CM | POA: Diagnosis not present

## 2022-09-07 DIAGNOSIS — N281 Cyst of kidney, acquired: Secondary | ICD-10-CM | POA: Diagnosis not present

## 2022-09-07 DIAGNOSIS — R188 Other ascites: Secondary | ICD-10-CM | POA: Diagnosis present

## 2022-09-07 DIAGNOSIS — N1831 Chronic kidney disease, stage 3a: Secondary | ICD-10-CM | POA: Diagnosis present

## 2022-09-07 DIAGNOSIS — K3532 Acute appendicitis with perforation and localized peritonitis, without abscess: Secondary | ICD-10-CM | POA: Diagnosis not present

## 2022-09-07 DIAGNOSIS — Z4659 Encounter for fitting and adjustment of other gastrointestinal appliance and device: Secondary | ICD-10-CM | POA: Diagnosis not present

## 2022-09-07 LAB — COMPREHENSIVE METABOLIC PANEL
ALT: 117 U/L — ABNORMAL HIGH (ref 0–44)
AST: 136 U/L — ABNORMAL HIGH (ref 15–41)
Albumin: 3.6 g/dL (ref 3.5–5.0)
Alkaline Phosphatase: 65 U/L (ref 38–126)
Anion gap: 11 (ref 5–15)
BUN: 37 mg/dL — ABNORMAL HIGH (ref 8–23)
CO2: 28 mmol/L (ref 22–32)
Calcium: 8.6 mg/dL — ABNORMAL LOW (ref 8.9–10.3)
Chloride: 99 mmol/L (ref 98–111)
Creatinine, Ser: 1.26 mg/dL — ABNORMAL HIGH (ref 0.61–1.24)
GFR, Estimated: >60 mL/min (ref >60)
Glucose, Bld: 121 mg/dL — ABNORMAL HIGH (ref 70–99)
Potassium: 3 mmol/L — ABNORMAL LOW (ref 3.5–5.1)
Sodium: 138 mmol/L (ref 135–145)
Total Bilirubin: 2.1 mg/dL — ABNORMAL HIGH (ref 0.3–1.2)
Total Protein: 6.2 g/dL — ABNORMAL LOW (ref 6.5–8.1)

## 2022-09-07 LAB — URINALYSIS, ROUTINE W REFLEX MICROSCOPIC
Bacteria, UA: NONE SEEN
Bilirubin Urine: NEGATIVE
Glucose, UA: >=500 mg/dL — AB
Hgb urine dipstick: NEGATIVE
Ketones, ur: 5 mg/dL — AB
Leukocytes,Ua: NEGATIVE
Nitrite: NEGATIVE
Protein, ur: NEGATIVE mg/dL
Specific Gravity, Urine: 1.03 (ref 1.005–1.030)
pH: 5 (ref 5.0–8.0)

## 2022-09-07 LAB — CBC WITH DIFFERENTIAL/PLATELET
Abs Immature Granulocytes: 0.03 10*3/uL (ref 0.00–0.07)
Basophils Absolute: 0 10*3/uL (ref 0.0–0.1)
Basophils Relative: 0 %
Eosinophils Absolute: 0 10*3/uL (ref 0.0–0.5)
Eosinophils Relative: 1 %
HCT: 37.2 % — ABNORMAL LOW (ref 39.0–52.0)
Hemoglobin: 12.2 g/dL — ABNORMAL LOW (ref 13.0–17.0)
Immature Granulocytes: 0 %
Lymphocytes Relative: 14 %
Lymphs Abs: 1.2 10*3/uL (ref 0.7–4.0)
MCH: 31 pg (ref 26.0–34.0)
MCHC: 32.8 g/dL (ref 30.0–36.0)
MCV: 94.4 fL (ref 80.0–100.0)
Monocytes Absolute: 0.8 10*3/uL (ref 0.1–1.0)
Monocytes Relative: 9 %
Neutro Abs: 6.4 10*3/uL (ref 1.7–7.7)
Neutrophils Relative %: 76 %
Platelets: 304 10*3/uL (ref 150–400)
RBC: 3.94 MIL/uL — ABNORMAL LOW (ref 4.22–5.81)
RDW: 14 % (ref 11.5–15.5)
WBC: 8.5 10*3/uL (ref 4.0–10.5)
nRBC: 0 % (ref 0.0–0.2)

## 2022-09-07 LAB — CREATININE, URINE, RANDOM: Creatinine, Urine: 178 mg/dL

## 2022-09-07 LAB — ACETAMINOPHEN LEVEL: Acetaminophen (Tylenol), Serum: <10 ug/mL — ABNORMAL LOW (ref 10–30)

## 2022-09-07 LAB — CK: Total CK: 50 U/L (ref 49–397)

## 2022-09-07 LAB — TSH: TSH: 0.919 u[IU]/mL (ref 0.350–4.500)

## 2022-09-07 LAB — PHOSPHORUS: Phosphorus: 4.7 mg/dL — ABNORMAL HIGH (ref 2.5–4.6)

## 2022-09-07 LAB — SODIUM, URINE, RANDOM: Sodium, Ur: 22 mmol/L

## 2022-09-07 LAB — MAGNESIUM: Magnesium: 2.5 mg/dL — ABNORMAL HIGH (ref 1.7–2.4)

## 2022-09-07 LAB — OSMOLALITY, URINE: Osmolality, Ur: 821 mOsm/kg (ref 300–900)

## 2022-09-07 LAB — LIPASE, BLOOD: Lipase: 22 U/L (ref 11–51)

## 2022-09-07 MED ORDER — IOHEXOL 300 MG/ML  SOLN
100.0000 mL | Freq: Once | INTRAMUSCULAR | Status: AC | PRN
  Administered 2022-09-07: 100 mL via INTRAVENOUS

## 2022-09-07 MED ORDER — ALBUTEROL SULFATE (2.5 MG/3ML) 0.083% IN NEBU: 2.5000 mg | INHALATION_SOLUTION | RESPIRATORY_TRACT | Status: DC | PRN

## 2022-09-07 MED ORDER — ONDANSETRON HCL 4 MG PO TABS: 4.0000 mg | ORAL_TABLET | Freq: Four times a day (QID) | ORAL | Status: DC | PRN

## 2022-09-07 MED ORDER — POTASSIUM CHLORIDE CRYS ER 20 MEQ PO TBCR
40.0000 meq | EXTENDED_RELEASE_TABLET | Freq: Once | ORAL | Status: AC
  Administered 2022-09-07: 40 meq via ORAL
  Filled 2022-09-07: qty 2

## 2022-09-07 MED ORDER — SODIUM CHLORIDE 0.9 % IV SOLN: INTRAVENOUS | Status: DC

## 2022-09-07 MED ORDER — HEPARIN SODIUM (PORCINE) 5000 UNIT/ML IJ SOLN
5000.0000 [IU] | Freq: Three times a day (TID) | INTRAMUSCULAR | Status: DC
  Administered 2022-09-07 – 2022-09-17 (×25): 5000 [IU] via SUBCUTANEOUS
  Filled 2022-09-07 (×26): qty 1

## 2022-09-07 MED ORDER — ONDANSETRON HCL 4 MG/2ML IJ SOLN
4.0000 mg | Freq: Once | INTRAMUSCULAR | Status: AC
  Administered 2022-09-07: 4 mg via INTRAVENOUS
  Filled 2022-09-07: qty 2

## 2022-09-07 MED ORDER — ONDANSETRON HCL 4 MG/2ML IJ SOLN
4.0000 mg | Freq: Four times a day (QID) | INTRAMUSCULAR | Status: DC | PRN
  Administered 2022-09-10 – 2022-09-12 (×4): 4 mg via INTRAVENOUS
  Filled 2022-09-07 (×5): qty 2

## 2022-09-07 MED ORDER — LACTATED RINGERS IV BOLUS
1000.0000 mL | Freq: Once | INTRAVENOUS | Status: AC
  Administered 2022-09-07: 1000 mL via INTRAVENOUS

## 2022-09-07 MED ORDER — FENTANYL CITRATE PF 50 MCG/ML IJ SOSY
12.5000 ug | PREFILLED_SYRINGE | INTRAMUSCULAR | Status: DC | PRN
  Administered 2022-09-09: 12.5 ug via INTRAVENOUS
  Administered 2022-09-10 – 2022-09-13 (×7): 50 ug via INTRAVENOUS
  Administered 2022-09-14: 12.5 ug via INTRAVENOUS
  Administered 2022-09-15: 50 ug via INTRAVENOUS
  Filled 2022-09-07 (×12): qty 1

## 2022-09-07 MED ORDER — LABETALOL HCL 100 MG PO TABS
100.0000 mg | ORAL_TABLET | Freq: Two times a day (BID) | ORAL | Status: DC
  Administered 2022-09-07 – 2022-09-11 (×8): 100 mg via ORAL
  Filled 2022-09-07 (×9): qty 1

## 2022-09-07 MED ORDER — OXYCODONE HCL 5 MG PO TABS
5.0000 mg | ORAL_TABLET | ORAL | Status: DC | PRN
  Administered 2022-09-07 – 2022-09-10 (×7): 10 mg via ORAL
  Administered 2022-09-11 – 2022-09-12 (×3): 5 mg via ORAL
  Filled 2022-09-07: qty 1
  Filled 2022-09-07: qty 2
  Filled 2022-09-07: qty 1
  Filled 2022-09-07: qty 2
  Filled 2022-09-07 (×2): qty 1
  Filled 2022-09-07 (×5): qty 2

## 2022-09-07 NOTE — Progress Notes (Signed)
Patient ID: Scott [name], male   DOB: [birth year], 72 y.o.   MRN: [id number]   Acute Care Surgery Service Progress Note:    Chief Complaint/Subjective: Patient known to our service.  Was seen in consultation approximately 3 weeks ago for partially obstructing rectal mass.  He underwent endoscopic evaluation and his biopsy was consistent with adenocarcinoma.  He was discharged on an aggressive bowel regimen with outpatient follow-up with oncology.  Admission CT scans earlier in the month showed no evidence of metastatic disease.  He has had an outpatient MRI which revealed locally advanced rectal adenocarcinoma -tumor extending into the lateral rectum involving the pelvic floor, mesorectal fascia branches of the hypogastric artery and likely sciatic nerve along with likely N2 nodal disease.   He was brought to the emergency room because he has had some progressive distention as well as 2 episodes of emesis.  He states that he has been having abdominal pain for about a few months and it is not any worse than a few weeks ago.  The daughter believes he has more progressive upper abdominal distention over the past few days since she last saw him.  Some difficulty voiding.  No fever or chills.  He has had some small loose bowel movements.  He has been taking laxatives.  He is no longer on Eliquis.  The daughter states he stopped that a few weeks ago.  The daughter is at the bedside and the patient's wife is on speaker phone.  No fever or chills.  He has been seen by Dr. 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 Intake/Output this shift: No intake/output data recorded.  Lungs: cta, nonlabored  Cardiovascular: reg  Abd: Bloated, distended, no guarding or rebound.  Mild tenderness  Extremities: no edema, +SCDs  Neuro: alert, nonfocal  Lab Results: CBC  Recent Labs    09/07/22 1534  WBC 8.5  HGB 12.2*  HCT 37.2*  PLT 304   BMET Recent Labs    09/07/22 1534  NA 138  K 3.0*  CL 99  CO2 28  GLUCOSE 121*  BUN 37*  CREATININE 1.26*  CALCIUM 8.6*   LFT    Latest Ref Rng & Units 09/07/2022    3:34 PM 08/19/2022    3:28 AM 08/18/2022    9:42 PM  Hepatic Function  Total Protein 6.5 - 8.1 g/dL 6.2  5.6  6.9   Albumin 3.5 - 5.0 g/dL 3.6  2.9  3.3   AST 15 - 41 U/L 136  15  25   ALT 0 - 44 U/L 117  13  15   Alk Phosphatase 38 - 126 U/L 65  60  68   Total Bilirubin 0.3 - 1.2 mg/dL 2.1  1.1  1.2   Bilirubin, Direct 0.0 - 0.2 mg/dL   0.3    PT/INR No results for input(s): "LABPROT", "INR" in the last 72 hours. ABG No results for input(s): "PHART", "HCO3" in the last  72 hours.  Invalid input(s): "PCO2", "PO2"  Studies/Results:  Anti-infectives: Anti-infectives (From admission, onward)    None       Medications: Scheduled Meds:  heparin injection (subcutaneous)  5,000 Units Subcutaneous Q8H   Continuous Infusions: PRN Meds:.  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  He is presenting with proximal upstream colonic distention.  He is nontoxic.  No signs of impending perforation.  No emergent need for surgical intervention  Admit inpatient to hospitalist N.p.o. except meds, ice chips sips of water Can have chemical VTE prophylaxis Hold Eliquis -it has been off for a few weeks Repeat labs, trend LFTs, correct hypokalemia  Discussed CT imaging results as well as recent MRI with patient and daughter and wife on the phone.  Explained that patient has locally advanced rectal cancer.  Explained that his mass is causing an upstream colonic obstruction.  I am not optimistic that additional laxatives will be sufficient to prevent worsening obstruction.  Believe he is failed nonsurgical intervention.  Do not believe an endoscopic stent is an option since the lesion is in the lower rectum.  Probable best option at this point is diverting colostomy.  Given the extent of the rectal tumor, definitive surgery is not an option.  Patient's obstruction will need to be managed prior to starting neoadjuvant therapy  Explained that Dr. Marcello Moores is our hospital surgeon this week who happens to be one of our colorectal surgeons.  I advised the patient and his family that I will discuss his case with Dr. Marcello Moores in the morning and she will review his imaging and recommend treatment options   High Level of medical decision making  Data reviewed: Reviewed CT scan from today, MRI pelvis March 7, CT angio chest abdomen pelvis August 18, 2022; discharge summary August 21, 2022; medical oncology note; [facility] primary care note September 06, 2022 Disposition:  LOS: 0 days     Leighton Ruff. Redmond Pulling, MD, FACS General, Bariatric, & Minimally Invasive Surgery [phone] [facility]

## 2022-09-07 NOTE — Assessment & Plan Note (Signed)
-  chronic avoid nephrotoxic medications such as NSAIDs, Vanco Zosyn combo,  avoid hypotension, continue to follow renal function  

## 2022-09-07 NOTE — ED Notes (Signed)
ED TO INPATIENT HANDOFF REPORT  ED Nurse Name and Phone #: Alroy Bailiff Name/Age/Gender Idolina Primer 72 y.o. male Room/Bed: WA04/WA04  Code Status   Code Status: Full Code  Home/SNF/Other Home Patient oriented to: self, place, time, and situation Is this baseline? Yes   Triage Complete: Triage complete  Chief Complaint Large bowel obstruction ([place]) [W41.324]  Triage Note Sent by oncology MD for bowel blockage.  C/o n/v.  Denies abd pain  Recent biopsy of rectal mass was positive for rectal adenocarcinoma    Allergies Allergies  Allergen Reactions   Amlodipine Itching   Carvedilol Other (See Comments)    Night terrors   Hydralazine Other (See Comments)    Night terrors, fatigued   Valsartan Other (See Comments)    Night terrors and fatigued    Level of Care/Admitting Diagnosis ED Disposition     ED Disposition  Admit   Condition  --   Comment  Hospital Area: [place] [[id number]]  Level of Care: Telemetry [5]  Admit to tele based on following criteria: Other see comments  Comments: hypokalemia  May admit patient to [facility] or [facility] if equivalent level of care is available:: No  Covid Evaluation: Asymptomatic - no recent exposure (last 10 days) testing not required  Diagnosis: Large bowel obstruction [facility]) [[id number]]  Admitting Physician: Toy Baker [[id number]]  Attending Physician: Toy Baker [[id number]]  Certification:: I certify this patient will need inpatient services for at least 2 midnights  Estimated Length of Stay: 2          B Medical/Surgery History Past Medical History:  Diagnosis Date   Dissection of aorta, abdominal ([place]) 02/02/2020   Essential hypertension 02/14/2020   Hypertension    Tobacco abuse, 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   Past Surgical History:  Procedure Laterality Date   ABDOMINAL AORTIC ENDOVASCULAR STENT GRAFT  02/02/2020    type b   APPENDECTOMY     BIOPSY  08/19/2022   Procedure: BIOPSY;   Surgeon: Sharyn Creamer, MD;  Location: [facility];  Service: Gastroenterology;;   Otho Darner SIGMOIDOSCOPY N/A 08/19/2022   Procedure: Beryle Quant;  Surgeon: Sharyn Creamer, MD;  Location: [facility];  Service: Gastroenterology;  Laterality: N/A;   SUBMUCOSAL TATTOO INJECTION  08/19/2022   Procedure: SUBMUCOSAL TATTOO INJECTION;  Surgeon: Sharyn Creamer, MD;  Location[facility];  Service: Gastroenterology;;   THORACIC AORTIC ENDOVASCULAR STENT GRAFT N/A 02/02/2020   Procedure: REPAIR TYPE B THORACIC AORTIC ENDOVASCULAR STENT;  Surgeon: [name], MD;  Location: [place];  Service: Vascular;  Laterality: N/A;     A IV Location/Drains/Wounds Patient Lines/Drains/Airways Status     Active Line/Drains/Airways     Name Placement date Placement time Site Days   Peripheral IV 09/07/22 18 G Anterior;Distal;Left;Upper Arm 09/07/22  1648  Arm  less than 1            Intake/Output Last 24 hours  Intake/Output Summary (Last 24 hours) at 09/07/2022 2039 Last data filed at 09/07/2022 1759 Gross per 24 hour  Intake 999 ml  Output --  Net 999 ml    Labs/Imaging Results for orders placed or performed during the hospital encounter of 09/07/22 (from the past 48 hour(s))  Urinalysis, Routine w reflex microscopic -Urine, Clean Catch     Status: Abnormal   Collection Time: 09/07/22  3:25 PM  Result Value Ref Range   Color, Urine AMBER (A) YELLOW    Comment: BIOCHEMICALS MAY BE AFFECTED BY  COLOR   APPearance CLEAR CLEAR   Specific Gravity, Urine 1.030 1.005 - 1.030   pH 5.0 5.0 - 8.0   Glucose, UA >=500 (A) NEGATIVE mg/dL   Hgb urine dipstick NEGATIVE NEGATIVE   Bilirubin Urine NEGATIVE NEGATIVE   Ketones, ur 5 (A) NEGATIVE mg/dL   Protein, ur NEGATIVE NEGATIVE mg/dL   Nitrite NEGATIVE NEGATIVE   Leukocytes,Ua NEGATIVE NEGATIVE   RBC / HPF 0-5 0 - 5 RBC/hpf   WBC, UA 0-5 0 - 5 WBC/hpf   Bacteria, UA NONE SEEN NONE SEEN   Squamous Epithelial / HPF 0-5 0 - 5 /HPF    Mucus PRESENT     Comment: Performed at [facility], [place] [address] [place], [place] [address]  Comprehensive metabolic panel     Status: Abnormal   Collection Time: 09/07/22  3:34 PM  Result Value Ref Range   Sodium 138 135 - 145 mmol/L   Potassium 3.0 (L) 3.5 - 5.1 mmol/L   Chloride 99 98 - 111 mmol/L   CO2 28 22 - 32 mmol/L   Glucose, Bld 121 (H) 70 - 99 mg/dL    Comment: Glucose reference range applies only to samples taken after fasting for at least 8 hours.   BUN 37 (H) 8 - 23 mg/dL   Creatinine, Ser 1.26 (H) 0.61 - 1.24 mg/dL   Calcium 8.6 (L) 8.9 - 10.3 mg/dL   Total Protein 6.2 (L) 6.5 - 8.1 g/dL   Albumin 3.6 3.5 - 5.0 g/dL   AST 136 (H) 15 - 41 U/L   ALT 117 (H) 0 - 44 U/L   Alkaline Phosphatase 65 38 - 126 U/L   Total Bilirubin 2.1 (H) 0.3 - 1.2 mg/dL   GFR, Estimated >60 >60 mL/min    Comment: (NOTE) Calculated using the CKD-EPI Creatinine Equation (2021)    Anion gap 11 5 - 15    Comment: Performed at [facility], [place] [address], [place], [place] [address]  CBC with Differential     Status: Abnormal   Collection Time: 09/07/22  3:34 PM  Result Value Ref Range   WBC 8.5 4.0 - 10.5 K/uL   RBC 3.94 (L) 4.22 - 5.81 MIL/uL   Hemoglobin 12.2 (L) 13.0 - 17.0 g/dL   HCT 37.2 (L) 39.0 - 52.0 %   MCV 94.4 80.0 - 100.0 fL   MCH 31.0 26.0 - 34.0 pg   MCHC 32.8 30.0 - 36.0 g/dL   RDW 14.0 11.5 - 15.5 %   Platelets 304 150 - 400 K/uL   nRBC 0.0 0.0 - 0.2 %   Neutrophils Relative % 76 %   Neutro Abs 6.4 1.7 - 7.7 K/uL   Lymphocytes Relative 14 %   Lymphs Abs 1.2 0.7 - 4.0 K/uL   Monocytes Relative 9 %   Monocytes Absolute 0.8 0.1 - 1.0 K/uL   Eosinophils Relative 1 %   Eosinophils Absolute 0.0 0.0 - 0.5 K/uL   Basophils Relative 0 %   Basophils Absolute 0.0 0.0 - 0.1 K/uL   Immature Granulocytes 0 %   Abs Immature Granulocytes 0.03 0.00 - 0.07 K/uL    Comment: Performed at [facility], [place] [address],  [place], [place] [address]  Lipase, blood     Status: None   Collection Time: 09/07/22  3:34 PM  Result Value Ref Range   Lipase 22 11 - 51 U/L    Comment: Performed at [facility], [place]  [address] [place], [place] [address]  Creatinine, urine, random     Status: None   Collection Time: 09/07/22  7:02 PM  Result Value Ref Range   Creatinine, Urine 178 mg/dL    Comment: Performed at [facility], [place] [address], [place], [place] [address]  Sodium, urine, random     Status: None   Collection Time: 09/07/22  7:02 PM  Result Value Ref Range   Sodium, Ur 22 mmol/L    Comment: Performed at [facility], [place] [address] [place], [place] [address]   CT ABDOMEN PELVIS W CONTRAST  Result Date: 09/07/2022 CLINICAL DATA:  Vomiting, possible bowel obstruction EXAM: CT ABDOMEN AND PELVIS WITH CONTRAST TECHNIQUE: Multidetector CT imaging of the abdomen and pelvis was performed using the standard protocol following bolus administration of intravenous contrast. RADIATION DOSE REDUCTION: This exam was performed according to the departmental dose-optimization program which includes automated exposure control, adjustment of the mA and/or kV according to patient size and/or use of iterative reconstruction technique. CONTRAST:  170mL OMNIPAQUE IOHEXOL 300 MG/ML  SOLN COMPARISON:  08/18/2022 FINDINGS: Lower chest: Descending thoracic stent graft partially visualized, stable in appearance. No pleural or pericardial effusion. Lung bases clear. Hepatobiliary: Gallbladder physiologically distended without wall thickening or regional inflammatory change. Stable hepatic cyst segment 4A. No new liver lesion or biliary ductal dilatation. Pancreas: Parenchymal atrophy without mass or ductal dilatation. No regional inflammatory change. Spleen: Normal in size without focal abnormality. Adrenals/Urinary Tract: No adrenal mass. 2.6 cm 21 HU exophytic mid right renal cyst; no  follow-up warranted. Symmetric scratch the no hydronephrosis or urolithiasis. Urinary bladder physiologically distended. Stomach/Bowel: Stomach is decompressed. Duodenum and proximal small bowel decompressed. Mid and distal small bowel is dilated with gas and fluid. There is marked dilatation of the cecum up to 10.1 cm, and gaseous dilatation of the remainder of the colon through the sigmoid segment. There transition to a constricted somewhat irregular thick-walled enhancing segment in the distal mid/distal rectum over length of at least 2.7 cm, with irregular peripheral margins extending into perirectal fat posterolaterally on the right. Vascular/Lymphatic: Moderate aortoiliac calcified atheromatous plaque. Portal vein patent. No definite mesenteric, pelvic, or retroperitoneal adenopathy. Reproductive: Prostate is unremarkable. Other: Small volume pelvic and right upper abdominal ascites. No free air. Musculoskeletal: Multilevel lumbar spondylitic change. No fracture or worrisome lesion. IMPRESSION: 1. Suspected distal colonic obstruction secondary to 2.7 cm mid/distal rectal mass. Correlate with physical exam. 2. No regional adenopathy or evidence of distal metastatic disease. 3. Small volume pelvic and right upper abdominal ascites. 4.  Aortic Atherosclerosis (ICD10-I70.0). Electronically Signed   By: Lucrezia Europe M.D.   On: 09/07/2022 17:49    Pending Labs Unresulted Labs (From admission, onward)     Start     Ordered   09/08/22 0500  Prealbumin  Tomorrow morning,   R        09/07/22 1901   09/08/22 0500  Hepatitis panel, acute  Tomorrow morning,   R        09/07/22 1943   09/07/22 2015  Acetaminophen level  Once,   R        09/07/22 2015   09/07/22 1902  CK  Add-on,   AD        09/07/22 1901   09/07/22 1902  Phosphorus  Add-on,   AD        09/07/22 1901   09/07/22 1902  Osmolality, urine  Once,   R  09/07/22 1901   09/07/22 1902  Osmolality  Add-on,   AD        09/07/22 1901   09/07/22  1902  TSH  Add-on,   AD        09/07/22 1901   09/07/22 1629  Magnesium  Add-on,   AD        09/07/22 1628   Signed and Held  Magnesium  Tomorrow morning,   R        Signed and Held   Signed and Held  Phosphorus  Tomorrow morning,   R        Signed and Held   Signed and Held  Comprehensive metabolic panel  Tomorrow morning,   R       Question:  Release to patient  Answer:  Immediate   Signed and Held   Signed and Held  CBC  Tomorrow morning,   R       Question:  Release to patient  Answer:  Immediate   Signed and Held            Vitals/Pain Today's Vitals   09/07/22 1700 09/07/22 1745 09/07/22 1800 09/07/22 1855  BP: 136/85     Pulse: (!) 111 (!) 119 (!) 106   Resp: 18     Temp:    97.9 F (36.6 C)  TempSrc:    Oral  SpO2: 98% 95% 100%   Weight:      Height:      PainSc:        Isolation Precautions No active isolations  Medications Medications  heparin injection 5,000 Units (has no administration in time range)  lactated ringers bolus 1,000 mL (0 mLs Intravenous Stopped 09/07/22 1759)  ondansetron (ZOFRAN) injection 4 mg (4 mg Intravenous Given 09/07/22 1650)  potassium chloride SA (KLOR-CON M) CR tablet 40 mEq (40 mEq Oral Given 09/07/22 1757)  iohexol (OMNIPAQUE) 300 MG/ML solution 100 mL (100 mLs Intravenous Contrast Given 09/07/22 1726)    Mobility walks     Focused Assessments    R Recommendations: See Admitting Provider Note  Report given to:   Additional Notes:

## 2022-09-07 NOTE — Assessment & Plan Note (Signed)
Will check CK CT abd showing physiologicaly distended gall bladder but no evidence of cholecystis Given significant gas distention abd [place] would be technicaly difficult at this time  No fever or chills to suggest infectious process  Add acetaminophen level  Rehydrate and follow LFt

## 2022-09-07 NOTE — ED Provider Notes (Signed)
Scott Provider Note   CSN: [id number] Arrival date & time: 09/07/22  1500     History  Chief Complaint  Patient presents with   Emesis    Scott [name] is a 72 y.o. male. With pmh AAA aortic dissection s/p aortic stent graft in 2021, , tobacco use, hypertension, persistent atrial fibrillation on Eliquis, newly diagnosed rectal adenocarcinoma who presents with decreased BMs for the past 9 days and increased abdominal pain and vomiting.  Patient's abdomen has progressively become more distended over the past 1 to 2 weeks.  He has had intermittent severe pains mainly on the right lower quadrant and left lower quadrant.  He has developed new nausea and nonbloody nonbilious emesis today with 2 episodes of emesis.  He has been having bowel movements but they have all been loose and not formed.  He is still passing gas intermittently.  He has had no fevers or chills, no URI symptoms, no UTI symptoms.  He was recently diagnosed with rectal adenocarcinoma.  He is currently not on any therapies no chemotherapy or radiation.  He has never had abdominal surgery per patient.  He had reportedly an x-ray outpatient with PCP concerning for bowel obstruction advised to go to the ED.   Emesis      Home Medications Prior to Admission medications   Medication Sig Start Date End Date Taking? Authorizing Provider  apixaban (ELIQUIS) 5 MG TABS tablet Take 5 mg by mouth 2 (two) times daily. Patient not taking: Reported on 09/01/2022 04/09/22   [provider]  empagliflozin (JARDIANCE) 25 MG TABS tablet Take 0.5 tablets by mouth every morning. Patient not taking: Reported on 08/19/2022 07/28/22   [provider]  furosemide (LASIX) 40 MG tablet Take 40 mg by mouth daily. Patient not taking: Reported on 08/19/2022 07/28/22   [provider]  HYDROcodone-acetaminophen (NORCO) 5-325 MG tablet Take 1 tablet by mouth every 6 (six) hours as  needed for moderate pain. 08/30/22   Lincoln Brigham, PA-C  labetalol (NORMODYNE) 100 MG tablet Take 1 tablet (100 mg total) by mouth 3 (three) times daily with meals. 08/26/21   Fenton, Clint R, PA  oxyCODONE (OXY IR/ROXICODONE) 5 MG immediate release tablet Take 1-2 tablets (5-10 mg total) by mouth every 4 (four) hours as needed for severe pain. 09/01/22   Orson Slick, MD  sacubitril-valsartan (ENTRESTO) 24-26 MG Take 1 tablet by mouth 2 (two) times daily. Patient not taking: Reported on 08/19/2022 07/28/22   [provider]  spironolactone (ALDACTONE) 25 MG tablet Take 0.5 tablets by mouth daily. Patient not taking: Reported on 08/19/2022 07/28/22   [provider]  tamsulosin (FLOMAX) 0.4 MG CAPS capsule Take 0.4 mg by mouth daily after supper. Patient not taking: Reported on 08/19/2022 03/12/22   [provider]      Allergies    Amlodipine, Carvedilol, Hydralazine, and Valsartan    Review of Systems   Review of Systems  Gastrointestinal:  Positive for vomiting.    Physical Exam Updated Vital Signs BP 136/85   Pulse (!) 106   Temp 97.9 F (36.6 C) (Oral)   Resp 18   Ht 5\' 6"  (1.676 m)   Wt 76 kg   SpO2 100%   BMI 27.04 kg/m  Physical Exam Constitutional: Alert and oriented.  Chronically ill-appearing but no acute distress nontoxic Eyes: Conjunctivae are normal. ENT      Head: Normocephalic and atraumatic. Cardiovascular: S1, S2, regular  rate, normal perfused Respiratory: Normal respiratory effort. Breath sounds are normal.  O2 sat 100 on RA Gastrointestinal: Distended abdomen, tympanitic, palpable bowel, RLQ ttp, no rebound or guarding Musculoskeletal: Normal range of motion in all extremities.      Right lower leg: No tenderness or edema.      Left lower leg: No tenderness or edema. Neurologic: Normal speech and language. No gross focal neurologic deficits are appreciated. Skin: Skin is warm, dry and intact. No rash noted. Psychiatric: Mood and  affect are normal. Speech and behavior are normal.  ED Results / Procedures / Treatments   Labs (all labs ordered are listed, but only abnormal results are displayed) Labs Reviewed  COMPREHENSIVE METABOLIC PANEL - Abnormal; Notable for the following components:      Result Value   Potassium 3.0 (*)    Glucose, Bld 121 (*)    BUN 37 (*)    Creatinine, Ser 1.26 (*)    Calcium 8.6 (*)    Total Protein 6.2 (*)    AST 136 (*)    ALT 117 (*)    Total Bilirubin 2.1 (*)    All other components within normal limits  CBC WITH DIFFERENTIAL/PLATELET - Abnormal; Notable for the following components:   RBC 3.94 (*)    Hemoglobin 12.2 (*)    HCT 37.2 (*)    All other components within normal limits  URINALYSIS, ROUTINE W REFLEX MICROSCOPIC - Abnormal; Notable for the following components:   Color, Urine AMBER (*)    Glucose, UA >=500 (*)    Ketones, ur 5 (*)    All other components within normal limits  LIPASE, BLOOD  MAGNESIUM    EKG None  Radiology CT ABDOMEN PELVIS W CONTRAST  Result Date: 09/07/2022 CLINICAL DATA:  Vomiting, possible bowel obstruction EXAM: CT ABDOMEN AND PELVIS WITH CONTRAST TECHNIQUE: Multidetector CT imaging of the abdomen and pelvis was performed using the standard protocol following bolus administration of intravenous contrast. RADIATION DOSE REDUCTION: This exam was performed according to the departmental dose-optimization program which includes automated exposure control, adjustment of the mA and/or kV according to patient size and/or use of iterative reconstruction technique. CONTRAST:  181mL OMNIPAQUE IOHEXOL 300 MG/ML  SOLN COMPARISON:  08/18/2022 FINDINGS: Lower chest: Descending thoracic stent graft partially visualized, stable in appearance. No pleural or pericardial effusion. Lung bases clear. Hepatobiliary: Gallbladder physiologically distended without wall thickening or regional inflammatory change. Stable hepatic cyst segment 4A. No new liver lesion or  biliary ductal dilatation. Pancreas: Parenchymal atrophy without mass or ductal dilatation. No regional inflammatory change. Spleen: Normal in size without focal abnormality. Adrenals/Urinary Tract: No adrenal mass. 2.6 cm 21 HU exophytic mid right renal cyst; no follow-up warranted. Symmetric scratch the no hydronephrosis or urolithiasis. Urinary bladder physiologically distended. Stomach/Bowel: Stomach is decompressed. Duodenum and proximal small bowel decompressed. Mid and distal small bowel is dilated with gas and fluid. There is marked dilatation of the cecum up to 10.1 cm, and gaseous dilatation of the remainder of the colon through the sigmoid segment. There transition to a constricted somewhat irregular thick-walled enhancing segment in the distal mid/distal rectum over length of at least 2.7 cm, with irregular peripheral margins extending into perirectal fat posterolaterally on the right. Vascular/Lymphatic: Moderate aortoiliac calcified atheromatous plaque. Portal vein patent. No definite mesenteric, pelvic, or retroperitoneal adenopathy. Reproductive: Prostate is unremarkable. Other: Small volume pelvic and right upper abdominal ascites. No free air. Musculoskeletal: Multilevel lumbar spondylitic change. No fracture or worrisome lesion. IMPRESSION: 1. Suspected distal  colonic obstruction secondary to 2.7 cm mid/distal rectal mass. Correlate with physical exam. 2. No regional adenopathy or evidence of distal metastatic disease. 3. Small volume pelvic and right upper abdominal ascites. 4.  Aortic Atherosclerosis (ICD10-I70.0). Electronically Signed   By: Lucrezia Europe M.D.   On: 09/07/2022 17:49    Procedures Procedures    Medications Ordered in ED Medications  lactated ringers bolus 1,000 mL (0 mLs Intravenous Stopped 09/07/22 1759)  ondansetron (ZOFRAN) injection 4 mg (4 mg Intravenous Given 09/07/22 1650)  potassium chloride SA (KLOR-CON M) CR tablet 40 mEq (40 mEq Oral Given 09/07/22 1757)  iohexol  (OMNIPAQUE) 300 MG/ML solution 100 mL (100 mLs Intravenous Contrast Given 09/07/22 1726)    ED Course/ Medical Decision Making/ A&P Clinical Course as of 09/07/22 1856  Tue Sep 07, 2022  1810 Spoke with Dr. Redmond Pulling of general surgery.  He is familiar with patient.  He will either see patient tonight or have colorectal to see patient over the weekend to discuss surgical options.  Recommends n.p.o. except for sips for meds and hold patient's blood thinners.  No NG tube required at this time unless having significant vomiting.  Will need medical admission. [VB]    Clinical Course User Index [VB] Elgie Congo, MD                             Medical Decision Making  Livingston [name] is a 72 y.o. male. With pmh AAA aortic dissection s/p aortic stent graft in 2021, , tobacco use, hypertension, persistent atrial fibrillation on Eliquis, newly diagnosed rectal adenocarcinoma who presents with decreased BMs for the past 9 days and increased abdominal pain and vomiting.    Patient's presentation on abdominal exam is concerning for bowel obstruction.  However also consider ileus or acute gastroenteritis although less likely based off history and presentation.  Labs reviewed by me notable for new transaminitis AST 136 ALT 117 but T. bili 2.1 as well as hypokalemia 3.  He has a normal white blood cell count 8.5.  Creatinine 1.26.  UA without evidence of UTI.  CTAP with contrast obtained which I personally reviewed with air-fluid levels concerning for bowel obstruction.  Read by radiology as suspected distal colonic obstruction secondary to 2.7 cm mid/distal rectal mass.  Associated with small volume pelvic and right upper abdominal ascites.  Discussed with Dr. Redmond Pulling of general surgery.  He is familiar with patient.  He will either see patient tonight or have colorectal to see patient over the weekend to discuss surgical options.  Recommends n.p.o. except for sips for meds and hold patient's blood thinners.   No NG tube required at this time unless having significant vomiting.  Will need medical admission.   D/w Dr Roel Cluck hospitalist will admit for continued management of large bowel obstruction.  Amount and/or Complexity of Data Reviewed Labs: ordered. Radiology: ordered.  Risk Prescription drug management. Decision regarding hospitalization.    Final Clinical Impression(s) / ED Diagnoses Final diagnoses:  Hypokalemia  Transaminitis  Large bowel obstruction [facility])    Rx / DC Orders ED Discharge Orders     None         Elgie Congo, MD 09/07/22 1856

## 2022-09-07 NOTE — H&P (Addendum)
Scott [name] [id number] DOB: [birth year] DOA: 09/07/2022     PCP: [facility]   Outpatient Specialists:      Oncology   General Surgery Dr. Dema Severin GI Dr.Dorsey  (  [facility])    Patient arrived to ER on 09/07/22 at 1500 Referred by Attending Elgie Congo, MD   Patient coming from:    home Lives  With family    Chief Complaint:   Chief Complaint  Patient presents with   Emesis    HPI: Scott [name] is a 72 y.o. 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  Seen by oncology for bowel blockage and was sent to emergency department Reports intermittent abdominal pain today had 2 episodes of vomiting but nonbilious nonbloody.  Has been having mainly loose bowel movements passing gas but intermittently feels abdominal distention no fevers or chills no URI symptoms no chest pain or shortness of breath.  No prior abdominal surgery Had a imaging done as an outpatient that was concerning for large bowel obstruction was told to go to emergency department Patient is taking Eliquis for A-fib  Has been off eliquis for weeks   Has not been taking his meds for months  He has lost about 10 pounds since last visit  Drink rare Does not smoke  Pt has scheduled appointment with Oncology tomorrow   Regarding pertinent Chronic problems:       HTN on labetalol on hold   chronic CHF  systolic?  - but last [id number] showed normal EF 55-60% Lasix Entresto spironolactone        A. Fib -  - CHA2DS2 vas score  3     current  on anticoagulation with   Eliquis on hold          -  Rate control:  Currently controlled with labetalol        CKD stage IIIa- baseline Cr 1.1    Lab Results  Component Value Date   CREATININE 1.26 (H) 09/07/2022   CREATININE 1.10 08/21/2022   CREATININE 1.12 08/19/2022      BPH - on Flomax,         Chronic anemia - baseline hg Hemoglobin & Hematocrit  Recent Labs    08/19/22 0328 08/21/22 0320 09/07/22 1534  HGB 10.6* 10.5* 12.2*   Iron/TIBC/Ferritin/ %Sat    Component Value Date/Time   IRON 87 08/18/2022 2142   TIBC 377 08/18/2022 2142   FERRITIN 18 (L) 08/18/2022 2142   IRONPCTSAT 23 08/18/2022 2142     While in ER: Clinical Course as of 09/07/22 1847  Tue Sep 07, 2022  1810 Spoke with Dr. Redmond Pulling of general surgery.  He is familiar with patient.  He will either see patient tonight or have colorectal to see patient over the weekend to discuss surgical options.  Recommends n.p.o. except for sips for meds and hold patient's blood thinners.  No NG tube required at this time unless having significant vomiting.  Will need medical admission. [VB]    Clinical Course User Index [VB] Elgie Congo, MD       Lab Orders         Comprehensive metabolic panel  CBC with Differential         Lipase, blood         Urinalysis, Routine w reflex microscopic -Urine, Clean Catch         Magnesium       CTabd/pelvis - . Suspected distal colonic obstruction secondary to 2.7 cm mid/distal rectal mass. Correlate with physical exam. 2. No regional adenopathy or evidence of distal metastatic disease. 3. Small volume pelvic and right upper abdominal ascites.   Following Medications were ordered in ER: Medications  lactated ringers bolus 1,000 mL (0 mLs Intravenous Stopped 09/07/22 1759)  ondansetron (ZOFRAN) injection 4 mg (4 mg Intravenous Given 09/07/22 1650)  potassium chloride SA (KLOR-CON M) CR tablet 40 mEq (40 mEq Oral Given 09/07/22 1757)  iohexol (OMNIPAQUE) 300 MG/ML solution 100 mL (100 mLs Intravenous Contrast Given 09/07/22 1726)     _______________________________________________________ ER Provider Called:    Dr. Redmond Pulling of general surgery They Recommend admit to medicine   Will see in AM or sooner  NPO except sips with meds   ED Triage Vitals  Enc Vitals Group     BP 09/07/22 1508 118/72     Pulse Rate 09/07/22 1508 77     Resp 09/07/22 1508 18     Temp 09/07/22 1508 97.7 F (36.5 C)     Temp Source 09/07/22 1508 Oral     SpO2 09/07/22 1508 100 %     Weight 09/07/22 1650 167 lb 8 oz (76 kg)     Height 09/07/22 1650 5\' 6"  (1.676 m)     Head Circumference --      Peak Flow --      Pain Score 09/07/22 1513 0     Pain Loc --      Pain Edu? --      Excl. in [place]? --   TMAX(24)@     _________________________________________ Significant initial  Findings: Abnormal Labs Reviewed  COMPREHENSIVE METABOLIC PANEL - Abnormal; Notable for the following components:      Result Value   Potassium 3.0 (*)    Glucose, Bld 121 (*)    BUN 37 (*)    Creatinine, Ser 1.26 (*)    Calcium 8.6 (*)    Total Protein 6.2 (*)    AST 136 (*)    ALT 117 (*)    Total Bilirubin 2.1 (*)    All other components within normal limits  CBC WITH DIFFERENTIAL/PLATELET - Abnormal; Notable for the following components:   RBC 3.94 (*)    Hemoglobin 12.2 (*)    HCT 37.2 (*)    All other components within normal limits  URINALYSIS, ROUTINE W REFLEX MICROSCOPIC - Abnormal; Notable for the following components:   Color, Urine AMBER (*)    Glucose, UA >=500 (*)    Ketones, ur 5 (*)    All other components within normal limits     ECG: Ordered    BNP (last 3 results) Recent Labs    04/11/22 1813  BNP 478.4*     The recent clinical data is shown below. Vitals:   09/07/22 1650 09/07/22 1700 09/07/22 1745 09/07/22 1800  BP:  136/85    Pulse:  (!) 111 (!) 119 (!) 106  Resp:  18    Temp:      TempSrc:      SpO2:  98% 95% 100%  Weight: 76 kg     Height: 5\' 6"  (1.676 m)       WBC  Component Value Date/Time   WBC 8.5  09/07/2022 1534   LYMPHSABS 1.2 09/07/2022 1534   MONOABS 0.8 09/07/2022 1534   EOSABS 0.0 09/07/2022 1534   BASOSABS 0.0 09/07/2022 1534      UA   no evidence of UTI      Urine analysis:    Component Value Date/Time   COLORURINE AMBER (A) 09/07/2022 1525   APPEARANCEUR CLEAR 09/07/2022 1525   LABSPEC 1.030 09/07/2022 1525   PHURINE 5.0 09/07/2022 1525   GLUCOSEU >=500 (A) 09/07/2022 1525   HGBUR NEGATIVE 09/07/2022 1525   BILIRUBINUR NEGATIVE 09/07/2022 1525   KETONESUR 5 (A) 09/07/2022 1525   PROTEINUR NEGATIVE 09/07/2022 1525   UROBILINOGEN 0.2 04/03/2013 1125   NITRITE NEGATIVE 09/07/2022 1525   LEUKOCYTESUR NEGATIVE 09/07/2022 1525      __________________________________________________________ Recent Labs  Lab 09/07/22 1534  NA 138  K 3.0*  CO2 28  GLUCOSE 121*  BUN 37*  CREATININE 1.26*  CALCIUM 8.6*    Cr  Up from baseline see below Lab Results  Component Value Date   CREATININE 1.26 (H) 09/07/2022   CREATININE 1.10 08/21/2022   CREATININE 1.12 08/19/2022    Recent Labs  Lab 09/07/22 1534  AST 136*  ALT 117*  ALKPHOS 65  BILITOT 2.1*  PROT 6.2*  ALBUMIN 3.6   Lab Results  Component Value Date   CALCIUM 8.6 (L) 09/07/2022   PHOS 2.9 08/18/2022    Plt: Lab Results  Component Value Date   PLT 304 09/07/2022    Recent Labs  Lab 09/07/22 1534  WBC 8.5  NEUTROABS 6.4  HGB 12.2*  HCT 37.2*  MCV 94.4  PLT 304    HG/HCT  stable,     Component Value Date/Time   HGB 12.2 (L) 09/07/2022 1534   HCT 37.2 (L) 09/07/2022 1534   MCV 94.4 09/07/2022 1534     Recent Labs  Lab 09/07/22 1534  LIPASE 22    ____________________________________________ Hospitalist was called for admission for   Hypokalemia, Transaminates Large bowel obstruction ([place]) due to rectal adenocarcinoma    The following Work up has been ordered so far:  Orders Placed This Encounter  Procedures   CT ABDOMEN PELVIS W CONTRAST   Comprehensive metabolic panel    CBC with Differential   Lipase, blood   Urinalysis, Routine w reflex microscopic -Urine, Clean Catch   Magnesium   Diet NPO time specified Except for: Sips with Meds, Ice Chips   Consult to general surgery   Consult to hospitalist     OTHER Significant initial  Findings:  labs showing:     DM  labs:  HbA1C: No results for input(s): "HGBA1C" in the last 8760 hours.     CBG (last 3)  No results for input(s): "GLUCAP" in the last 72 hours.        Cultures:    Component Value Date/Time   SDES BLOOD SITE NOT SPECIFIED 08/12/2021 2339   SPECREQUEST  08/12/2021 2339    BOTTLES DRAWN AEROBIC AND ANAEROBIC Blood Culture results may not be optimal due to an inadequate volume of blood received in culture bottles   CULT  08/12/2021 2339    NO GROWTH 5 DAYS Performed at [facility], [place] [address] [place], [place] [address]    REPTSTATUS 08/17/2021 FINAL 08/12/2021 2339     Radiological Exams on Admission: CT ABDOMEN PELVIS W CONTRAST  Result Date: 09/07/2022 CLINICAL DATA:  Vomiting, possible bowel obstruction EXAM: CT ABDOMEN AND PELVIS  WITH CONTRAST TECHNIQUE: Multidetector CT imaging of the abdomen and pelvis was performed using the standard protocol following bolus administration of intravenous contrast. RADIATION DOSE REDUCTION: This exam was performed according to the departmental dose-optimization program which includes automated exposure control, adjustment of the mA and/or kV according to patient size and/or use of iterative reconstruction technique. CONTRAST:  169mL OMNIPAQUE IOHEXOL 300 MG/ML  SOLN COMPARISON:  08/18/2022 FINDINGS: Lower chest: Descending thoracic stent graft partially visualized, stable in appearance. No pleural or pericardial effusion. Lung bases clear. Hepatobiliary: Gallbladder physiologically distended without wall thickening or regional inflammatory change. Stable hepatic cyst segment 4A. No new liver lesion or biliary ductal dilatation. Pancreas:  Parenchymal atrophy without mass or ductal dilatation. No regional inflammatory change. Spleen: Normal in size without focal abnormality. Adrenals/Urinary Tract: No adrenal mass. 2.6 cm 21 HU exophytic mid right renal cyst; no follow-up warranted. Symmetric scratch the no hydronephrosis or urolithiasis. Urinary bladder physiologically distended. Stomach/Bowel: Stomach is decompressed. Duodenum and proximal small bowel decompressed. Mid and distal small bowel is dilated with gas and fluid. There is marked dilatation of the cecum up to 10.1 cm, and gaseous dilatation of the remainder of the colon through the sigmoid segment. There transition to a constricted somewhat irregular thick-walled enhancing segment in the distal mid/distal rectum over length of at least 2.7 cm, with irregular peripheral margins extending into perirectal fat posterolaterally on the right. Vascular/Lymphatic: Moderate aortoiliac calcified atheromatous plaque. Portal vein patent. No definite mesenteric, pelvic, or retroperitoneal adenopathy. Reproductive: Prostate is unremarkable. Other: Small volume pelvic and right upper abdominal ascites. No free air. Musculoskeletal: Multilevel lumbar spondylitic change. No fracture or worrisome lesion. IMPRESSION: 1. Suspected distal colonic obstruction secondary to 2.7 cm mid/distal rectal mass. Correlate with physical exam. 2. No regional adenopathy or evidence of distal metastatic disease. 3. Small volume pelvic and right upper abdominal ascites. 4.  Aortic Atherosclerosis (ICD10-I70.0). Electronically Signed   By: Lucrezia Europe M.D.   On: 09/07/2022 17:49   _______________________________________________________________________________________________________ Latest  Blood pressure 136/85, pulse (!) 106, temperature 97.7 F (36.5 C), temperature source Oral, resp. rate 18, height 5\' 6"  (1.676 m), weight 76 kg, SpO2 100 %.   Vitals  labs and radiology finding personally reviewed  Review of Systems:     Pertinent positives include:  abdominal pain, nausea, vomiting, diarrhea,weight loss   Constitutional:  No weight loss, night sweats, Fevers, chills, fatigue,  HEENT:  No headaches, Difficulty swallowing,Tooth/dental problems,Sore throat,  No sneezing, itching, ear ache, nasal congestion, post nasal drip,  Cardio-vascular:  No chest pain, Orthopnea, PND, anasarca, dizziness, palpitations.no Bilateral lower extremity swelling  GI:  No heartburn, indigestion,  change in bowel habits, loss of appetite, melena, blood in stool, hematemesis Resp:  no shortness of breath at rest. No dyspnea on exertion, No excess mucus, no productive cough, No non-productive cough, No coughing up of blood.No change in color of mucus.No wheezing. Skin:  no rash or lesions. No jaundice GU:  no dysuria, change in color of urine, no urgency or frequency. No straining to urinate.  No flank pain.  Musculoskeletal:  No joint pain or no joint swelling. No decreased range of motion. No back pain.  Psych:  No change in mood or affect. No depression or anxiety. No memory loss.  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    Past Surgical History:  Procedure Laterality Date   ABDOMINAL AORTIC ENDOVASCULAR STENT GRAFT  02/02/2020    type b   APPENDECTOMY     BIOPSY  08/19/2022   Procedure: BIOPSY;  Surgeon: Sharyn Creamer, MD;  Location: [facility];  Service: Gastroenterology;;   Otho Darner SIGMOIDOSCOPY N/A 08/19/2022   Procedure: Beryle Quant;   Surgeon: Sharyn Creamer, MD;  Location: [facility];  Service: Gastroenterology;  Laterality: N/A;   SUBMUCOSAL TATTOO INJECTION  08/19/2022   Procedure: SUBMUCOSAL TATTOO INJECTION;  Surgeon: Sharyn Creamer, MD;  Location[facility] END[facility]COPY;  Service: Gastroenterology;;   THORACIC AORTIC ENDOVASCULAR STENT GRAFT N/A 02/02/2020   Procedure: REPAIR TYPE B THORACIC AORTIC ENDOVASCULAR STENT;  Surgeon: [name], MD;  Location: [facility] OR;  Service: Vascular;  Laterality: N/A;    Social History:  Ambulatory   independently      reports that he has quit smoking. He has never used smokeless tobacco. He reports current alcohol use of about 1.0 - 2.0 standard drink of alcohol per week. He reports current drug use. Drug: Marijuana.   Family History:    Family History  Problem Relation Age of Onset   Lung cancer Sister    ______________________________________________________________________________________________ Allergies: Allergies  Allergen Reactions   Amlodipine Itching   Carvedilol Other (See Comments)    Night terrors   Hydralazine Other (See Comments)    Night terrors, fatigued   Valsartan Other (See Comments)    Night terrors and fatigued     Prior to Admission medications   Medication Sig Start Date End Date Taking? Authorizing Provider  apixaban (ELIQUIS) 5 MG TABS tablet Take 5 mg by mouth 2 (two) times daily. Patient not taking: Reported on 09/01/2022 04/09/22   [provider]  empagliflozin (JARDIANCE) 25 MG TABS tablet Take 0.5 tablets by mouth every morning. Patient not taking: Reported on 08/19/2022 07/28/22   [provider]  furosemide (LASIX) 40 MG tablet Take 40 mg by mouth daily. Patient not taking: Reported on 08/19/2022 07/28/22   [provider]  HYDROcodone-acetaminophen (NORCO) 5-325 MG tablet Take 1 tablet by mouth every 6 (six) hours as needed for moderate pain. 08/30/22   Lincoln Brigham, PA-C  labetalol (NORMODYNE) 100 MG tablet Take 1  tablet (100 mg total) by mouth 3 (three) times daily with meals. 08/26/21   Fenton, Clint R, PA  oxyCODONE (OXY IR/ROXICODONE) 5 MG immediate release tablet Take 1-2 tablets (5-10 mg total) by mouth every 4 (four) hours as needed for severe pain. 09/01/22   Orson Slick, MD  sacubitril-valsartan (ENTRESTO) 24-26 MG Take 1 tablet by mouth 2 (two) times daily. Patient not taking: Reported on 08/19/2022 07/28/22   [provider]  spironolactone (ALDACTONE) 25 MG tablet Take 0.5 tablets by mouth daily. Patient not taking: Reported on 08/19/2022 07/28/22   [provider]  tamsulosin (FLOMAX) 0.4 MG CAPS capsule Take 0.4 mg by mouth daily after supper. Patient not taking: Reported on 08/19/2022 03/12/22   [provider]    ___________________________________________________________________________________________________ Physical Exam:    09/07/2022    6:00 PM 09/07/2022    5:45 PM 09/07/2022    5:00 PM  Vitals with BMI  Systolic   [id number]  Diastolic   85  Pulse [id number] 111     1. General:  in No  Acute distress   Chronically ill  -appearing 2. Psychological: Alert and   Oriented 3. Head/ENT:    Dry Mucous Membranes                          Head Non traumatic, neck supple                            Poor Dentition 4. SKIN:   decreased Skin turgor,  Skin clean Dry and intact no rash    5. Heart: Regular rate and rhythm no  Murmur, no Rub or gallop 6. Lungs:  no wheezes or crackles   7. Abdomen:  -tender, distended bowel sounds present 8. Lower extremities: no clubbing, cyanosis, no  edema 9. Neurologically Grossly intact, moving all 4 extremities equally  10. MSK: Normal range of motion    Chart has been reviewed  ______________________________________________________________________________________________  Assessment/Plan 72 y.o. 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, , tobacco use, hypertension,  persistent atrial fibrillation on Eliquis, newly diagnosed rectal adenocarcinoma stage 3   Admitted for   Hypokalemia, Transaminitis, Large bowel obstruction ([place])    Present on Admission:  Large bowel obstruction ([place])  Essential hypertension  Hypokalemia  Persistent atrial fibrillation ([name])  CKD (chronic kidney disease) stage 2, GFR 60-89 ml/min  Elevated LFTs     Large bowel obstruction ([place]) Colorectal surgery to see in AM  Possible plan for ostomy  tomorrow Discussed with [facility] as well  "  don't think there is any role for GI currently with his colonic obstruction".   Which I agree with   Essential hypertension Allow permissive htn  Hypokalemia Wil replace check magnesium    Persistent atrial fibrillation ([name]) No longer on Eliquis  Will continue to hold due to need for operative intervention Continue labetalol at 120m po bid  Given persistent tachycardia   CKD (chronic kidney disease) stage 2, GFR 60-89 ml/min  -chronic avoid nephrotoxic medications such as NSAIDs, Vanco Zosyn combo,  avoid hypotension, continue to follow renal function   Elevated LFTs Will check CK CT abd showing physiologicaly distended gall bladder but no evidence of cholecystis Given significant gas distention abd [place] would be technicaly difficult at this time  No fever or chills to suggest infectious process  Add acetaminophen level  Rehydrate and follow LFt      Other plan as per orders.  DVT prophylaxis:  hep [place]   Code Status:    Code Status: Prior FULL CODE  as per patient  family  I had personally discussed CODE STATUS with patient and family    Family Communication:   Family   at  Bedside  plan of care was discussed with   Daughter,    Disposition Plan:       To home once workup is complete and patient is stable   Following barriers for discharge:                            Electrolytes corrected  Will need  consultants to evaluate patient prior to discharge    Consults called: general Surgery  Email oncology Sen msg to [facility] that pt is here  Admission status:  ED Disposition     ED Disposition  [place]: [facility] [[id number]]  Level of Care: Telemetry [5]  Admit to tele based on following criteria: Other see comments  Comments: hypokalemia  May admit patient to [facility] or [facility] if equivalent level of care is available:: No  Covid Evaluation: Asymptomatic - no recent exposure (last 10 days) testing not required  [facility]: [id number]  Admitting Physician: Toy Baker [[id number]]  Attending Physician: Toy Baker [id number]  Certification:: I certify this patient will need inpatient services for at least 2 midnights  Estimated Length of Stay: 2            inpatient     I Expect 2 midnight stay secondary to severity of patient's current illness need for inpatient interventions justified by the following:  hemodynamic instability despite optimal treatment (tachycardia )  Severe lab/radiological/exam abnormalities including:    Large bowel obstruction  and extensive comorbidities including:   CKD     malignancy,    That are currently affecting medical management.    I expect  patient to be hospitalized for 2 midnights requiring inpatient medical care.  Patient is at high risk for adverse outcome (such as loss of life or disability) if not treated.  Indication for inpatient stay as follows:    severe pain requiring acute inpatient management,    Need for operative/procedural  intervention    Need for  IV fluids,  IV pain medications,      Level of care     tele  For  24H        Keiland Pickering 09/07/2022, 8:38 PM    [facility]     after 2 AM please page floor coverage PA If 7AM-7PM, please contact the day team taking care of the patient  using [email or web address]

## 2022-09-07 NOTE — Assessment & Plan Note (Addendum)
No longer on Eliquis  Will continue to hold due to need for operative intervention Continue labetalol at 156m po bid  Given persistent tachycardia

## 2022-09-07 NOTE — ED Provider Triage Note (Signed)
Emergency Medicine Provider Triage Evaluation Note  Scott [name] , a 72 y.o. male  was evaluated in triage.  Pt complains of vomiting.  Patient reports that he was advised to come to the emergency department for evaluation by her primary care provider.  Out of concern about possible bowel obstruction.  Patient reports it has been 9 days since last bowel movement.  Was recently diagnosed with a mass/tumor in his colon.  Chart review appears to show this is a malignant tumor.  Reports he still able to pass gas.  Some decrease in urine flow.  Does report some associated shortness of breath but denies any significant chest pain.  Review of Systems  Positive: As above Negative: As above  Physical Exam  BP 118/72 (BP Location: Right Arm)   Pulse 77   Temp 97.7 F (36.5 C) (Oral)   Resp 18   SpO2 100%  Gen:   Awake, no distress  Resp:  Normal effort  MSK:   Moves extremities without difficulty  Other:  Tenderness to palpation in her abdomen. Abdomen feels firm. Bowel sounds increased.  Medical Decision Making  Medically screening exam initiated at 3:26 PM.  Appropriate orders placed.  Franciscus Zisk was informed that the remainder of the evaluation will be completed by another provider, this initial triage assessment does not replace that evaluation, and the importance of remaining in the ED until their evaluation is complete.     Luvenia Heller, PA-C 09/07/22 1529

## 2022-09-07 NOTE — ED Triage Notes (Signed)
Sent by oncology MD for bowel blockage.  C/o n/v.  Denies abd pain  Recent biopsy of rectal mass was positive for rectal adenocarcinoma

## 2022-09-07 NOTE — Subjective & Objective (Signed)
Pt with nausea Known history of colorectal cancer have not started chemoradiation therapy.  Seen by oncology for bowel blockage and was sent to emergency department Reports intermittent abdominal pain today had 2 episodes of vomiting but nonbilious nonbloody.  Has been having mainly loose bowel movements passing gas but intermittently feels abdominal distention no fevers or chills no URI symptoms no chest pain or shortness of breath.  No prior abdominal surgery Had a imaging done as an outpatient that was concerning for large bowel obstruction was told to go to emergency department Patient is taking Eliquis for A-fib

## 2022-09-07 NOTE — Assessment & Plan Note (Addendum)
Colorectal surgery to see in AM  Possible plan for ostomy  tomorrow Discussed with [facility] as well  "  don't think there is any role for GI currently with his colonic obstruction".   Which I agree with

## 2022-09-07 NOTE — Assessment & Plan Note (Signed)
Allow permissive htn ?

## 2022-09-07 NOTE — Assessment & Plan Note (Addendum)
Wil replace check magnesium

## 2022-09-08 ENCOUNTER — Other Ambulatory Visit: Payer: Self-pay | Admitting: Hematology and Oncology

## 2022-09-08 ENCOUNTER — Inpatient Hospital Stay: Payer: Medicare Other | Admitting: Hematology and Oncology

## 2022-09-08 DIAGNOSIS — C189 Malignant neoplasm of colon, unspecified: Secondary | ICD-10-CM | POA: Diagnosis not present

## 2022-09-08 DIAGNOSIS — K56609 Unspecified intestinal obstruction, unspecified as to partial versus complete obstruction: Secondary | ICD-10-CM | POA: Diagnosis not present

## 2022-09-08 DIAGNOSIS — D649 Anemia, unspecified: Secondary | ICD-10-CM | POA: Insufficient documentation

## 2022-09-08 DIAGNOSIS — K6289 Other specified diseases of anus and rectum: Secondary | ICD-10-CM

## 2022-09-08 DIAGNOSIS — R7989 Other specified abnormal findings of blood chemistry: Secondary | ICD-10-CM | POA: Diagnosis not present

## 2022-09-08 DIAGNOSIS — I739 Peripheral vascular disease, unspecified: Secondary | ICD-10-CM | POA: Insufficient documentation

## 2022-09-08 DIAGNOSIS — C20 Malignant neoplasm of rectum: Secondary | ICD-10-CM

## 2022-09-08 DIAGNOSIS — E876 Hypokalemia: Secondary | ICD-10-CM | POA: Diagnosis not present

## 2022-09-08 LAB — PREALBUMIN: Prealbumin: 12 mg/dL — ABNORMAL LOW (ref 18–38)

## 2022-09-08 LAB — HEPATITIS PANEL, ACUTE
HCV Ab: NONREACTIVE
Hep A IgM: NONREACTIVE
Hep B C IgM: NONREACTIVE
Hepatitis B Surface Ag: NONREACTIVE

## 2022-09-08 LAB — CBC
HCT: 33.8 % — ABNORMAL LOW (ref 39.0–52.0)
Hemoglobin: 10.8 g/dL — ABNORMAL LOW (ref 13.0–17.0)
MCH: 30.9 pg (ref 26.0–34.0)
MCHC: 32 g/dL (ref 30.0–36.0)
MCV: 96.6 fL (ref 80.0–100.0)
Platelets: 263 10*3/uL (ref 150–400)
RBC: 3.5 MIL/uL — ABNORMAL LOW (ref 4.22–5.81)
RDW: 14.2 % (ref 11.5–15.5)
WBC: 6.4 10*3/uL (ref 4.0–10.5)
nRBC: 0 % (ref 0.0–0.2)

## 2022-09-08 LAB — COMPREHENSIVE METABOLIC PANEL
ALT: 89 U/L — ABNORMAL HIGH (ref 0–44)
AST: 67 U/L — ABNORMAL HIGH (ref 15–41)
Albumin: 3.2 g/dL — ABNORMAL LOW (ref 3.5–5.0)
Alkaline Phosphatase: 59 U/L (ref 38–126)
Anion gap: 9 (ref 5–15)
BUN: 36 mg/dL — ABNORMAL HIGH (ref 8–23)
CO2: 27 mmol/L (ref 22–32)
Calcium: 8.3 mg/dL — ABNORMAL LOW (ref 8.9–10.3)
Chloride: 104 mmol/L (ref 98–111)
Creatinine, Ser: 1.22 mg/dL (ref 0.61–1.24)
GFR, Estimated: >60 mL/min (ref >60)
Glucose, Bld: 92 mg/dL (ref 70–99)
Potassium: 3.2 mmol/L — ABNORMAL LOW (ref 3.5–5.1)
Sodium: 140 mmol/L (ref 135–145)
Total Bilirubin: 1.4 mg/dL — ABNORMAL HIGH (ref 0.3–1.2)
Total Protein: 5.5 g/dL — ABNORMAL LOW (ref 6.5–8.1)

## 2022-09-08 LAB — PHOSPHORUS: Phosphorus: 3.9 mg/dL (ref 2.5–4.6)

## 2022-09-08 LAB — OSMOLALITY: Osmolality: 304 mOsm/kg — ABNORMAL HIGH (ref 275–295)

## 2022-09-08 LAB — SURGICAL PCR SCREEN
MRSA, PCR: NEGATIVE
Staphylococcus aureus: NEGATIVE

## 2022-09-08 LAB — MAGNESIUM: Magnesium: 2.5 mg/dL — ABNORMAL HIGH (ref 1.7–2.4)

## 2022-09-08 MED ORDER — BOOST / RESOURCE BREEZE PO LIQD CUSTOM: 1.0000 | Freq: Three times a day (TID) | ORAL | Status: DC

## 2022-09-08 MED ORDER — POTASSIUM CHLORIDE 10 MEQ/100ML IV SOLN
10.0000 meq | INTRAVENOUS | Status: AC
  Administered 2022-09-08 (×4): 10 meq via INTRAVENOUS
  Filled 2022-09-08 (×4): qty 100

## 2022-09-08 MED ORDER — POTASSIUM CHLORIDE 2 MEQ/ML IV SOLN
INTRAVENOUS | Status: DC
  Filled 2022-09-08 (×2): qty 1000

## 2022-09-08 NOTE — Assessment & Plan Note (Addendum)
Afib with RVR Baseline is pers AF rates ~100.  Has been off Eliquis since start of month when he self-held due to rectal bleeding from cancer.     Still with NG - Continue scheduled IV metoprolol - COntinue dilt drip - Transition to PO when able to remove NG - Hold home labetalol - Hold Eliquis for now, resume when cleared by Gen Surg, at present do not feel compelling need for anticoagulation, will hold heparin

## 2022-09-08 NOTE — Assessment & Plan Note (Signed)
-   Follow up with Surgery and Oncology after discharge

## 2022-09-08 NOTE — Assessment & Plan Note (Addendum)
Hgb stable ~9-10, no clinical bleeding

## 2022-09-08 NOTE — Assessment & Plan Note (Addendum)
Admitted and Gen Surg/GI consulted.  Unfortunately, not amenable to stenting, so went to OR for colostomy 3/22 - Post op care per Gen Surg

## 2022-09-08 NOTE — Assessment & Plan Note (Signed)
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

## 2022-09-08 NOTE — Progress Notes (Signed)
Mobility Specialist - Progress Note   09/08/22 1303  Mobility  Activity Ambulated with assistance in hallway  Level of Assistance Independent after set-up  Assistive Device Other (Comment) (IV Pole)  Distance Ambulated (ft) 600 ft  Activity Response Tolerated well  Mobility Referral Yes  $Mobility charge 1 Mobility   Pt received in bed and agreeable to mobility. Prior to ambulating, requested to use bathroom. No complaints during session. Pt to room standing after session with all needs met & family in room.    [facility]

## 2022-09-08 NOTE — Consult Note (Addendum)
Consultation Note   Referring Provider:  [facility] PCP: [facility] Primary Gastroenterologist: .pgdor        Reason for consultation: Obstructing rectal mass  Hospital Day: 2   ASSESSMENT   72 yo male with the following:   Partially obstructing rectal adenocarcinoma, recently diagnosed.  Appears locally advanced on CT scan  Colorectal Surgery has evaluated  and colostomy discussed. However, GI asked whether a colonic stent is feasible.   Elevated liver chemistries.  Mainly hepatocellular injury pattern. Liver enzymes improved overnight. Today AST 57 / ALT 89. Tbili improved to 1.4. No liver lesions on CT scan   Paroxysmal Afib, no longer on [place]  CHF. Echo Feb 2023 with EF 55-60 % , elevated right sided pressures, biatrial enlargement.    PLAN   Dr. Carlean Purl will review CT scan / patient's case. He will discuss with patient and family about whether a colonic stent is feasible in his case.  --------------------------------------------------------------------------------------------    [facility] Attending   I have taken an interval history, reviewed the chart and examined the patient. I agree with the Advanced Practitioner's note, impression and recommendations with the following additions:  Digital rectal exam reveals that the tumor is too close to the  anal verge for successful stenting.  I think this is too low and he would be quite uncomfortable with a stent.  Colostomy makes the most sense.  I have explained this to the patient and his family.  Signing off  Gatha Mayer, MD, [facility]  History of Present Illness   Patient is a 72 y.o. year old male with a past medical history of  Afib, AAA repair in 2021, CHF, BPH,  and recently diagnosed rectal cancer   See PMH for any additional medical problems.  Patient recently diagnosed with rectal adenocarcinoma. No prior colon cancer screening. No known [place] of  colon cancer. He has been struggling with constipation since December. Sometimes haS gone up to 9 days without a BM despite Dulcolax, lactulose and Miralax. No rectal bleeding. No weight loss. Only one episode of vomiting and that was in the last 1-2 days. He saw [facility] PCP on 3/18. He was impacted / obstipated. Sent to ED.    Labs notable for:  hgb 10.8 ( stable) , K+ 3.2, BUN 37, Cr 1.26, AST 136, ALT 117, Tbili 2.1, normal lipase.  Normal TSH. UA with > 500 glucose  Imaging notable for: CT scan w contrast 1. Suspected distal colonic obstruction secondary to 2.7 cm mid/distal rectal mass. Correlate with physical exam. 2. No regional adenopathy or evidence of distal metastatic disease. 3. Small volume pelvic and right upper abdominal ascites. 4.  Aortic Atherosclerosis (ICD10-I70.0).  He has actually started passing some stool. It is mushy, unformed.    Previous GI History / Evaluation :   09/01/22 Flexible sigmoidoscopy   -Likely malignant, partially obstructing rectal tumor Rectum, biopsy - ADENOCARCINOMA, MODERATELY DIFFERENTIATED (SEE NOTE)   Imaging:   Feb 2023 Echo IMPRESSIONS Left ventricular ejection fraction, by estimation, is 55 to 60%. The left ventricle has normal function. The left ventricle has no regional wall motion abnormalities. The left ventricular internal cavity size was mildly dilated. There is mild left ventricular hypertrophy. Left ventricular  diastolic function could not be evaluated. Elevated left atrial pressure. 1. Right ventricular systolic function is normal. The right ventricular size is normal. There is normal pulmonary artery systolic pressure. 2. 3. Left atrial size was mildly dilated. 4. Right atrial size was severely dilated. The mitral valve is normal in structure. Trivial mitral valve regurgitation. No evidence of mitral stenosis. 5. The aortic valve is tricuspid. Aortic valve regurgitation is mild. No aortic stenosis is present. 6. 7. Aortic  dilatation noted. There is mild dilatation of the aortic root, measuring 40 mm. The inferior vena cava is normal in size with greater than 50% respiratory variability, suggesting right atrial pressure of 3 mmHg.    Recent Labs    09/07/22 1534 09/08/22 0450  WBC 8.5 6.4  HGB 12.2* 10.8*  HCT 37.2* 33.8*  PLT 304 263   Recent Labs    09/07/22 1534 09/08/22 0450  NA 138 140  K 3.0* 3.2*  CL 99 104  CO2 28 27  GLUCOSE 121* 92  BUN 37* 36*  CREATININE 1.26* 1.22  CALCIUM 8.6* 8.3*   Recent Labs    09/08/22 0450  PROT 5.5*  ALBUMIN 3.2*  AST 67*  ALT 89*  ALKPHOS 74  BILITOT 1.4*   Recent Labs    09/08/22 0450  HEPBSAG NON REACTIVE  HCVAB NON REACTIVE  HEPAIGM NON REACTIVE  HEPBIGM NON REACTIVE      Past Medical History:  Diagnosis Date   Dissection of aorta, abdominal ([place]) 02/02/2020   Essential hypertension 02/14/2020   Hypertension    Tobacco abuse, 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    Past Surgical History:  Procedure Laterality Date   ABDOMINAL AORTIC ENDOVASCULAR STENT GRAFT  02/02/2020    type b   APPENDECTOMY     BIOPSY  08/19/2022   Procedure: BIOPSY;  Surgeon: Sharyn Creamer, MD;  Location: [facility];  Service: Gastroenterology;;   Otho Darner SIGMOIDOSCOPY N/A 08/19/2022   Procedure: Beryle Quant;  Surgeon: Sharyn Creamer, MD;  Location: [place];  Service: Gastroenterology;  Laterality: N/A;   SUBMUCOSAL TATTOO INJECTION  08/19/2022   Procedure: SUBMUCOSAL TATTOO INJECTION;  Surgeon: Sharyn Creamer, MD;  Location[facility] ENDOSCOPY;  Service: Gastroenterology;;   THORACIC AORTIC ENDOVASCULAR STENT GRAFT N/A 02/02/2020   Procedure: REPAIR TYPE B THORACIC AORTIC ENDOVASCULAR STENT;  Surgeon: [name], MD;  Location: [facility] OR;  Service: Vascular;  Laterality: N/A;    Family History  Problem Relation Age of Onset   Lung cancer Sister     Prior to Admission medications   Medication Sig Start Date End Date Taking? Authorizing Provider   empagliflozin (JARDIANCE) 25 MG TABS tablet Take 0.5 tablets by mouth every morning. 07/28/22  Yes [provider]  furosemide (LASIX) 40 MG tablet Take 40 mg by mouth daily as needed for edema or fluid. 07/28/22  Yes [provider]  labetalol (NORMODYNE) 100 MG tablet Take 1 tablet (100 mg total) by mouth 3 (three) times daily with meals. 08/26/21  Yes Fenton, Clint R, PA  NON FORMULARY Take 1 tablet by mouth daily as needed (Constipation). Laxative. Unknown name.   Yes [provider]  oxyCODONE (OXY IR/ROXICODONE) 5 MG immediate release tablet Take 1-2 tablets (5-10 mg total) by mouth every 4 (four) hours as needed for severe pain. 09/01/22  Yes Orson Slick, MD  polyethylene glycol (MIRALAX / GLYCOLAX) 17 g packet Take 17 g by mouth daily as needed for mild constipation or moderate constipation.   Yes  [provider]  sacubitril-valsartan (ENTRESTO) 24-26 MG Take 0.5 tablets by mouth daily. 07/28/22  Yes [provider]  spironolactone (ALDACTONE) 25 MG tablet Take 0.5 tablets by mouth daily. 07/28/22  Yes [provider]    Current Facility-Administered Medications  Medication Dose Route Frequency Provider Last Rate Last Admin   albuterol (PROVENTIL) (2.5 MG/3ML) 0.083% nebulizer solution 2.5 mg  2.5 mg Nebulization Q2H PRN Doutova, Anastassia, MD       feeding supplement (BOOST / RESOURCE BREEZE) liquid 1 Container  1 Container Oral TID BM Danford, Suann Larry, MD       fentaNYL (SUBLIMAZE) injection 12.5-50 mcg  12.5-50 mcg Intravenous Q2H PRN Toy Baker, MD       heparin injection 5,000 Units  5,000 Units Subcutaneous Q8H Doutova, Anastassia, MD   5,000 Units at 09/08/22 1314   labetalol (NORMODYNE) tablet 100 mg  100 mg Oral BID Toy Baker, MD   100 mg at 09/08/22 0950   ondansetron (ZOFRAN) tablet 4 mg  4 mg Oral Q6H PRN Toy Baker, MD       Or   ondansetron (ZOFRAN) injection 4 mg  4 mg Intravenous Q6H PRN  Doutova, Anastassia, MD       oxyCODONE (Oxy IR/ROXICODONE) immediate release tablet 5-10 mg  5-10 mg Oral Q4H PRN Toy Baker, MD   10 mg at 09/07/22 2325    Allergies as of 09/07/2022 - Review Complete 09/07/2022  Allergen Reaction Noted   Amlodipine Itching 08/26/2021   Carvedilol Other (See Comments) 08/26/2021   Hydralazine Other (See Comments) 03/19/2020   Valsartan Other (See Comments) 03/19/2020    Social History   Socioeconomic History   Marital status: Married    Spouse name: Not on file   Number of children: 3   Years of education: Not on file   Highest education level: Not on file  Occupational History   Occupation: veteran  Tobacco Use   Smoking status: Former   Smokeless tobacco: Never   Tobacco comments:    Quit 2021  Vaping Use   Vaping Use: Never used  Substance and Sexual Activity   Alcohol use: Yes    Alcohol/week: 1.0 - 2.0 standard drink of alcohol    Types: 1 - 2 Cans of beer per week    Comment: 1-2 beers 1-2 times a week 08/26/21   Drug use: Yes    Types: Marijuana    Comment: used yesterday   Sexual activity: Not on file  Other Topics Concern   Not on file  Social History Narrative   Not on file   Social Determinants of Health   Financial Resource Strain: Not on file  Food Insecurity: No Food Insecurity (09/07/2022)   Hunger Vital Sign    Worried About Running Out of Food in the Last Year: Never true    Ran Out of Food in the Last Year: Never true  Transportation Needs: No Transportation Needs (09/07/2022)   PRAPARE - Hydrologist (Medical): No    Lack of Transportation (Non-Medical): No  Physical Activity: Not on file  Stress: Not on file  Social Connections: Unknown (07/09/2020)   Social Connection and Isolation Panel [NHANES]    Frequency of Communication with Friends and Family: More than three times a week    Frequency of Social Gatherings with Friends and Family: More than three times a week     Attends Religious Services: 1 to 4 times per year    Active  Member of Clubs or Organizations: No    Attends Archivist Meetings: 1 to 4 times per year    Marital Status: Patient declined  Intimate Partner Violence: Not At Risk (09/07/2022)   Humiliation, Afraid, Rape, and Kick questionnaire    Fear of Current or Ex-Partner: No    Emotionally Abused: No    Physically Abused: No    Sexually Abused: No    Review of Systems: All systems reviewed and negative except where noted in HPI.  Physical Exam: Vital signs in last 24 hours: Temp:  [97.7 F (36.5 C)-98.3 F (36.8 C)] 98.3 F (36.8 C) (03/20 1318) Pulse Rate:  [54-119] 68 (03/20 1318) Resp:  [16-19] 16 (03/20 1318) BP: (125-147)/(75-95) 147/86 (03/20 1318) SpO2:  [95 %-100 %] 98 % (03/20 1318) Weight:  [76 kg] 76 kg (03/19 1650) Last BM Date : 09/08/22  General:  Alert male in NAD Psych:  Pleasant, cooperative. Normal mood and affect Eyes: Pupils equal Ears:  Normal auditory acuity Nose: No deformity, discharge or lesions Neck:  Supple, no masses felt Lungs:  Clear to auscultation.  Heart:  Regular rate, irregular rhythm.  Abdomen:  Soft, distended,  active bowel sounds Rectal :  Deferred Msk: Symmetrical without gross deformities.  Neurologic:  Alert, oriented, grossly normal neurologically Extremities : No edema Skin:  Intact without significant lesions.    Intake/Output from previous day: 03/19 0701 - 03/20 0700 In: 1764.8 [I.V.:765.8; IV Piggyback:999] Out: -  Intake/Output this shift:  Total I/O In: 120 [P.O.:120] Out: -     Principal Problem:   Large bowel obstruction ([name]) Active Problems:   Essential hypertension   Hypokalemia   Persistent atrial fibrillation ([name])   Stage 3a chronic kidney disease (CKD) ([name])   Elevated LFTs   PVD (peripheral vascular disease) ([place])   Colon cancer ([name])   Normocytic anemia    Tye Savoy, NP-C @  09/08/2022, 3:41 PM

## 2022-09-08 NOTE — Progress Notes (Signed)
Hematology/Oncology Progress Note  Clinical Summary: [name] is a 72 year old male with history significant for locally advanced rectal adenocarcinoma who is currently admitted with concern for bowel obstruction.  Interval History: -- Presented to the emergency department on 09/06/2021 due to inability to pass a bowel movement.   -- Pathology confirms rectal adenocarcinoma, staging completed with MRI pelvis --Bowels are moving but this is thought to be overflow with continued stool buildup behind rectal mass. -- Surgery and gastroenterology have been consulted and are following. -- Patient's wife and daughter are at bedside today during our discussion. -- Patient denies any nausea, vomiting, and endorses bowels are moving. -- Patient has been able to tolerate p.o. and is receiving IV fluids.  O:  Vitals:   09/08/22 0928 09/08/22 1318  BP: (!) 146/85 (!) 147/86  Pulse: (!) 55 68  Resp: 16 16  Temp: 97.8 F (36.6 C) 98.3 F (36.8 C)  SpO2: 96% 98%      Latest Ref Rng & Units 09/08/2022    4:50 AM 09/07/2022    3:34 PM 08/21/2022    3:20 AM  CMP  Glucose 70 - 99 mg/dL 92  121  116   BUN 8 - 23 mg/dL 36  37  16   Creatinine 0.61 - 1.24 mg/dL 1.22  1.26  1.10   Sodium 135 - 145 mmol/L 140  138  140   Potassium 3.5 - 5.1 mmol/L 3.2  3.0  3.4   Chloride 98 - 111 mmol/L 104  99  106   CO2 22 - 32 mmol/L 27  28  23    Calcium 8.9 - 10.3 mg/dL 8.3  8.6  8.4   Total Protein 6.5 - 8.1 g/dL 5.5  6.2    Total Bilirubin 0.3 - 1.2 mg/dL 1.4  2.1    Alkaline Phos 38 - 126 U/L 59  65    AST 15 - 41 U/L 67  136    ALT 0 - 44 U/L 89  117        Latest Ref Rng & Units 09/08/2022    4:50 AM 09/07/2022    3:34 PM 08/21/2022    3:20 AM  CBC  WBC 4.0 - 10.5 K/uL 6.4  8.5  7.6   Hemoglobin 13.0 - 17.0 g/dL 10.8  12.2  10.5   Hematocrit 39.0 - 52.0 % 33.8  37.2  31.2   Platelets 150 - 400 K/uL 263  304  300       GENERAL: well appearing elderly Caucasian male in NAD  SKIN: skin color,  texture, turgor are normal, no rashes or significant lesions EYES: conjunctiva are pink and non-injected, sclera clear LUNGS: clear to auscultation and percussion with normal breathing effort HEART: regular rate & rhythm and no murmurs and no lower extremity edema Musculoskeletal: no cyanosis of digits and no clubbing  PSYCH: alert & oriented x 3, fluent speech NEURO: no focal motor/sensory deficits  Assessment/Plan: [name] is a 72 year old male with history significant for locally advanced rectal adenocarcinoma who is currently admitted with concern for bowel obstruction.  # Bowel Obstruction -- Agree with evaluation by surgery and gastroenterology -- At this time my preference would be for ostomy placement to prevent bowel obstruction while we are undergoing chemotherapy and chemoradiation. -- GI will be discussing the feasibility of stent placement with the patient..  # Rectal Adenocarcinoma, Locally Advanced -- MRI confirms a locally aggressive adenocarcinoma of the rectum. -- Will plan to pursue total  neoadjuvant treatment with chemotherapy followed by chemoradiation.  Finally would recommend surgical resection after complete neoadjuvant treatment.  Chemotherapy of choice would be FOLFOX and capecitabine would be administered with radiation therapy. --We discussed the overarching plan for this patient forward based on his pathology and imaging results.  The patient, his wife, his daughter voiced understanding of our recommendations and plan moving forward. -- Will order port placement while patient is in the hospital. -- Patient would like a second opinion with [facility].  We will help to arrange this in the outpatient setting. -- Oncology will continue to follow.  Ledell Peoples, MD Department of Hematology/Oncology [place] [facility] Phone: [phone] Pager: [phone] Email: [name]

## 2022-09-08 NOTE — Assessment & Plan Note (Addendum)
-   Supplemented and resolved 

## 2022-09-08 NOTE — Assessment & Plan Note (Addendum)
Cr stable relative to baseline, kidney injury ruled out

## 2022-09-08 NOTE — Progress Notes (Signed)
Initial Nutrition Assessment  INTERVENTION:   -Boost Breeze po TID, each supplement provides 250 kcal and 9 grams of protein   Once diet advanced:  -Ensure Plus High Protein po BID, each supplement provides 350 kcal and 20 grams of protein.  -Multivitamin with minerals daily  NUTRITION DIAGNOSIS:   Increased nutrient needs related to chronic illness as evidenced by estimated needs.  GOAL:   Patient will meet greater than or equal to 90% of their needs  MONITOR:   PO intake, Supplement acceptance, Weight trends, Labs, I & O's, Diet advancement  REASON FOR ASSESSMENT:   Consult Assessment of nutrition requirement/status  ASSESSMENT:   72 y.o. 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, , tobacco use, hypertension, persistent atrial fibrillation on Eliquis, newly diagnosed rectal adenocarcinoma stage 3 . Imaging showing large bowel obstruction due to colorectal cancer.  Patient in room, family at bedside. Pt reports he has not eaten for 8 days now. Foods were coming back up. Drank a whole Pepsi one day but states he had a lot of gas afterwards. Has been subsisting on water mostly. Pt followed by Surgery, GI to see for possible stent. Pt having BMs. On clear liquids now. Pt is agreeable to trying protein supplements given poor PO and weight loss.  Pt states he has started eating less than usual around 3-4 months ago. States he has been taking Mg and a daily MVI. No protein shakes during this time.   Pt reports UBW of 167-177 lbs.  Current documented weight in system: 167 lbs. Weight documented on whiteboard in room:  161 lbs (73.3 kg)  Medications: Lactated ringers, KCl  Labs reviewed: Low K Elevated Mg   NUTRITION - FOCUSED PHYSICAL EXAM:  Flowsheet Row Most Recent Value  Orbital Region No depletion  Upper Arm Region Mild depletion  Thoracic and Lumbar Region No depletion  Buccal Region No depletion  Temple Region No  depletion  Clavicle Bone Region No depletion  Clavicle and Acromion Bone Region Mild depletion  Scapular Bone Region No depletion  Dorsal Hand No depletion  Patellar Region No depletion  Anterior Thigh Region No depletion  Posterior Calf Region No depletion  Edema (RD Assessment) None  Hair Reviewed  Eyes Reviewed  Mouth Reviewed  Skin Reviewed  Nails Reviewed       Diet Order:   Diet Order             Diet clear liquid Room service appropriate? Yes; Fluid consistency: Thin  Diet effective now                   EDUCATION NEEDS:   No education needs have been identified at this time  Skin:  Skin Assessment: Reviewed RN Assessment  Last BM:  3/20 -type 6  Height:   Ht Readings from Last 1 Encounters:  09/07/22 5\' 6"  (1.676 m)    Weight:   Wt Readings from Last 1 Encounters:  09/07/22 76 kg    BMI:  Body mass index is 27.04 kg/m.  Estimated Nutritional Needs:   Kcal:  2000-2200  Protein:  100-115g  Fluid:  2L/day   Clayton Bibles, MS, RD, LDN Inpatient Clinical Dietitian Contact information available via Amion

## 2022-09-08 NOTE — Progress Notes (Signed)
Mobility Specialist - Progress Note   09/08/22 0918  Mobility  Activity Ambulated with assistance in hallway;Ambulated with assistance to bathroom  Level of Assistance Modified independent, requires aide device or extra time  Assistive Device Front wheel walker  Distance Ambulated (ft) 500 ft  Activity Response Tolerated well  Mobility Referral Yes  $Mobility charge 1 Mobility   Pt received in bed and agreeable to mobility. Prior to ambulating pt requested assistance to bathroom. No complaints during session. Pt to bed after session with all needs met.    [facility]

## 2022-09-08 NOTE — TOC CM/SW Note (Signed)
Transition of Care [facility]) Screening Note  Patient Details  Name: Scott [name] Date of Birth: 02/07/1951  Transition of [facility]) CM/SW Contact:    Sherie Don, LCSW Phone Number: 09/08/2022, 10:54 AM  Transition of Care Department [facility]) has reviewed patient and no TOC needs have been identified at this time. We will continue to monitor patient advancement through interdisciplinary progression rounds. If new patient transition needs arise, please place a TOC consult.

## 2022-09-08 NOTE — Hospital Course (Addendum)
Scott [name] is a 72 y.o. 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  In the ER, CT showed suspected distal colonic obstruction secondary to 2.7 cm mid/distal rectal mass.  3/19: Admitted, Gen Surg consulted, felt he had failed nonoperative management 3/20: GI consulted, unable to stent 3/21: Gen Surg planning for surgery tomorrow 3/22: Underwent Colostomy with Dr. Marcello Moores; intra-operatively also found to have cecal perforation incidentally, and underwent right colectomy with primary anastomosis; developed hives post-op 3/23: NG and foley out, doing well 3/24: Into afib with RVR, transferred to SDU 3/25: Delirious, AXR shows ileus 3/26: Delirium resolved, TPN started 3/28: NGT out, diet advanced, weaning TPN  Consultants General surgery GI Oncology   Procedures LAPAROSCOPIC ASSISTED LOOP COLOSTOMY PLACEMENT, RIGHT COLECTOMY [id number]

## 2022-09-08 NOTE — Progress Notes (Signed)
  Progress Note   Patient: Scott [name] [id number] DOB: October 09, 1950 DOA: 09/07/2022     1 DOS: the patient was seen and examined on 09/08/2022 at 10:04 AM      Brief hospital course: [name] is a 72 y.o. 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  In the ER, CT showed suspected distal colonic obstruction secondary to 2.7 cm mid/distal rectal mass.    3/19: Admitted, Gen Surg consulted, felt he had failed nonoperative management 3/20: Gen Surg will consult GI re: possible stent, trial clears     Assessment and Plan: * Large bowel obstruction ([name]) Patient would like to avoid colostomy. 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  General surgery are planning to to Discus with GI.  Patient is hungry for clears.     Physical Exam: BP (!) 147/86 (BP Location: Right Arm)   Pulse 68   Temp 98.3 F (36.8 C) (Oral)   Resp 16   Ht 5\' 6"  (1.676 m)   Wt 76 kg   SpO2 98%   BMI 27.04 kg/m   Adult male, lying in bed, no acute distress RRR, no murmurs, no peripheral edema Respiratory normal, lungs clear without rales or wheezes Abdomen soft, mildly distended, no  significant tenderness to palpation, some mild grimace to palpation, no guarding No rigidity Attentive to conversation, oriented to person place and time, face symmetric, speech fluent, moves upper extremities with generalized weakness but symmetric strength    Data Reviewed: Discussed with general surgery team Basic metabolic panel shows mild hypokalemia, normal renal function LFTs slightly down CBC shows mild anemia CT abdomen and pelvis as described above  Family Communication: Wife at the bedside    Disposition: Status is: Inpatient Patient presented with colon obstruction due to cancer, general surgery and GI will negotiate surgery versus stent placement, will trial clears in the meantime          Author: Edwin Dada, MD 09/08/2022 2:34 PM  For on call review [email or web address].

## 2022-09-08 NOTE — Assessment & Plan Note (Addendum)
BP elevated - Hold Entresto and spironolactone and home labetalol - Continue Metoprolol and diltiazem

## 2022-09-08 NOTE — Progress Notes (Signed)
Progress Note     Subjective: Pt reports he is feeling better actually this AM. Having bowel function and distention is still present but maybe a little better. Stools have been soft/mushy and he has had about 5-6 BM. Not passing much flatus. Wife at bedside this AM. He confirms that he ist taking miralax and colace BID at home  Objective: Vital signs in last 24 hours: Temp:  [97.7 F (36.5 C)-98 F (36.7 C)] 97.8 F (36.6 C) (03/20 0928) Pulse Rate:  [54-119] 55 (03/20 0928) Resp:  [16-19] 16 (03/20 0928) BP: (118-146)/(72-95) 146/85 (03/20 0928) SpO2:  [95 %-100 %] 96 % (03/20 0928) Weight:  [76 kg] 76 kg (03/19 1650) Last BM Date : 09/07/22  Intake/Output from previous day: 03/19 0701 - 03/20 0700 In: 1764.8 [I.V.:765.8; IV Piggyback:999] Out: -  Intake/Output this shift: No intake/output data recorded.  PE: General: pleasant, WD, WN male who is laying in bed in NAD Heart: regular, rate, and rhythm.   Lungs: CTAB, no wheezes, rhonchi, or rales noted.  Respiratory effort nonlabored Abd: soft, NT, moderately distended, +BS Psych: A&Ox3 with an appropriate affect.    Lab Results:  Recent Labs    09/07/22 1534 09/08/22 0450  WBC 8.5 6.4  HGB 12.2* 10.8*  HCT 37.2* 33.8*  PLT 304 263   BMET Recent Labs    09/07/22 1534 09/08/22 0450  NA 138 140  K 3.0* 3.2*  CL 99 104  CO2 28 27  GLUCOSE 121* 92  BUN 37* 36*  CREATININE 1.26* 1.22  CALCIUM 8.6* 8.3*   PT/INR No results for input(s): "LABPROT", "INR" in the last 72 hours. CMP     Component Value Date/Time   NA 140 09/08/2022 0450   K 3.2 (L) 09/08/2022 0450   CL 104 09/08/2022 0450   CO2 27 09/08/2022 0450   GLUCOSE 92 09/08/2022 0450   BUN 36 (H) 09/08/2022 0450   CREATININE 1.22 09/08/2022 0450   CALCIUM 8.3 (L) 09/08/2022 0450   PROT 5.5 (L) 09/08/2022 0450   ALBUMIN 3.2 (L) 09/08/2022 0450   AST 67 (H) 09/08/2022 0450   ALT 89 (H) 09/08/2022 0450   ALKPHOS 59 09/08/2022 0450   BILITOT  1.4 (H) 09/08/2022 0450   GFRNONAA >60 09/08/2022 0450   GFRAA >60 02/06/2020 0436   Lipase     Component Value Date/Time   LIPASE 22 09/07/2022 1534       Studies/Results: CT ABDOMEN PELVIS W CONTRAST  Result Date: 09/07/2022 CLINICAL DATA:  Vomiting, possible bowel obstruction EXAM: CT ABDOMEN AND PELVIS WITH CONTRAST TECHNIQUE: Multidetector CT imaging of the abdomen and pelvis was performed using the standard protocol following bolus administration of intravenous contrast. RADIATION DOSE REDUCTION: This exam was performed according to the departmental dose-optimization program which includes automated exposure control, adjustment of the mA and/or kV according to patient size and/or use of iterative reconstruction technique. CONTRAST:  163mL OMNIPAQUE IOHEXOL 300 MG/ML  SOLN COMPARISON:  08/18/2022 FINDINGS: Lower chest: Descending thoracic stent graft partially visualized, stable in appearance. No pleural or pericardial effusion. Lung bases clear. Hepatobiliary: Gallbladder physiologically distended without wall thickening or regional inflammatory change. Stable hepatic cyst segment 4A. No new liver lesion or biliary ductal dilatation. Pancreas: Parenchymal atrophy without mass or ductal dilatation. No regional inflammatory change. Spleen: Normal in size without focal abnormality. Adrenals/Urinary Tract: No adrenal mass. 2.6 cm 21 HU exophytic mid right renal cyst; no follow-up warranted. Symmetric scratch the no hydronephrosis or urolithiasis. Urinary bladder physiologically  distended. Stomach/Bowel: Stomach is decompressed. Duodenum and proximal small bowel decompressed. Mid and distal small bowel is dilated with gas and fluid. There is marked dilatation of the cecum up to 10.1 cm, and gaseous dilatation of the remainder of the colon through the sigmoid segment. There transition to a constricted somewhat irregular thick-walled enhancing segment in the distal mid/distal rectum over length of at  least 2.7 cm, with irregular peripheral margins extending into perirectal fat posterolaterally on the right. Vascular/Lymphatic: Moderate aortoiliac calcified atheromatous plaque. Portal vein patent. No definite mesenteric, pelvic, or retroperitoneal adenopathy. Reproductive: Prostate is unremarkable. Other: Small volume pelvic and right upper abdominal ascites. No free air. Musculoskeletal: Multilevel lumbar spondylitic change. No fracture or worrisome lesion. IMPRESSION: 1. Suspected distal colonic obstruction secondary to 2.7 cm mid/distal rectal mass. Correlate with physical exam. 2. No regional adenopathy or evidence of distal metastatic disease. 3. Small volume pelvic and right upper abdominal ascites. 4.  Aortic Atherosclerosis (ICD10-I70.0). Electronically Signed   By: Lucrezia Europe M.D.   On: 09/07/2022 17:49    Anti-infectives: Anti-infectives (From admission, onward)    None        Assessment/Plan  Rectal adenocarcinoma with LBO - CT yesterday with 2.7 cm rectal mass and likely colonic obstruction  - clinically patient now having more bowel function and feeling better this AM - will discuss with MD and likely GI  - patient taking miralax BID and colace BID at home and developing worsening obstructive symptoms even taking in mostly liquids - will ask GI if stent would be appropriate to consider, if not may need to consider diverting loop colostomy - no indication for emergent surgical intervention but we will follow  - keep NPO this AM   FEN: NPO, LR @125  cc/h VTE: eliquis on hold  ID: no current abx  - per TRH -  HTN A. Fib on Eliquis - currently on hold  CKD stage II Chronic systolic CHF BPH Anemia of chronic disease  Hx of abdominal aortic dissection s/p stent graft  Hx of tobacco abuse  LOS: 1 day   I reviewed ED provider notes, hospitalist notes, last 24 h vitals and pain scores, last 48 h intake and output, last 24 h labs and trends, and last 24 h imaging  results.     Norm Parcel, [facility] Surgery 09/08/2022, 11:15 AM Please see Amion for pager number during day hours 7:00am-4:30pm

## 2022-09-09 DIAGNOSIS — C189 Malignant neoplasm of colon, unspecified: Secondary | ICD-10-CM | POA: Diagnosis not present

## 2022-09-09 DIAGNOSIS — R7989 Other specified abnormal findings of blood chemistry: Secondary | ICD-10-CM | POA: Diagnosis not present

## 2022-09-09 DIAGNOSIS — E876 Hypokalemia: Secondary | ICD-10-CM | POA: Diagnosis not present

## 2022-09-09 DIAGNOSIS — K56609 Unspecified intestinal obstruction, unspecified as to partial versus complete obstruction: Secondary | ICD-10-CM | POA: Diagnosis not present

## 2022-09-09 LAB — CBC
HCT: 37.8 % — ABNORMAL LOW (ref 39.0–52.0)
Hemoglobin: 11.7 g/dL — ABNORMAL LOW (ref 13.0–17.0)
MCH: 30.4 pg (ref 26.0–34.0)
MCHC: 31 g/dL (ref 30.0–36.0)
MCV: 98.2 fL (ref 80.0–100.0)
Platelets: 284 10*3/uL (ref 150–400)
RBC: 3.85 MIL/uL — ABNORMAL LOW (ref 4.22–5.81)
RDW: 14.4 % (ref 11.5–15.5)
WBC: 11.5 10*3/uL — ABNORMAL HIGH (ref 4.0–10.5)
nRBC: 0 % (ref 0.0–0.2)

## 2022-09-09 LAB — COMPREHENSIVE METABOLIC PANEL
ALT: 71 U/L — ABNORMAL HIGH (ref 0–44)
AST: 46 U/L — ABNORMAL HIGH (ref 15–41)
Albumin: 3.5 g/dL (ref 3.5–5.0)
Alkaline Phosphatase: 60 U/L (ref 38–126)
Anion gap: 11 (ref 5–15)
BUN: 31 mg/dL — ABNORMAL HIGH (ref 8–23)
CO2: 25 mmol/L (ref 22–32)
Calcium: 8.7 mg/dL — ABNORMAL LOW (ref 8.9–10.3)
Chloride: 103 mmol/L (ref 98–111)
Creatinine, Ser: 1.29 mg/dL — ABNORMAL HIGH (ref 0.61–1.24)
GFR, Estimated: 59 mL/min — ABNORMAL LOW (ref >60)
Glucose, Bld: 97 mg/dL (ref 70–99)
Potassium: 4 mmol/L (ref 3.5–5.1)
Sodium: 139 mmol/L (ref 135–145)
Total Bilirubin: 2.4 mg/dL — ABNORMAL HIGH (ref 0.3–1.2)
Total Protein: 5.7 g/dL — ABNORMAL LOW (ref 6.5–8.1)

## 2022-09-09 LAB — PROTIME-INR
INR: 1.2 (ref 0.8–1.2)
Prothrombin Time: 15.1 seconds (ref 11.4–15.2)

## 2022-09-09 MED ORDER — ORAL CARE MOUTH RINSE: 15.0000 mL | OROMUCOSAL | Status: DC | PRN

## 2022-09-09 MED ORDER — SODIUM CHLORIDE 0.9 % IV SOLN
2.0000 g | INTRAVENOUS | Status: AC
  Administered 2022-09-10: 2 g via INTRAVENOUS
  Filled 2022-09-09: qty 2

## 2022-09-09 MED ORDER — POTASSIUM CHLORIDE 2 MEQ/ML IV SOLN
INTRAVENOUS | Status: DC
  Filled 2022-09-09 (×9): qty 1000

## 2022-09-09 NOTE — Progress Notes (Signed)
Mobility Specialist - Progress Note   09/09/22 0949  Mobility  Activity Ambulated independently in hallway  Level of Assistance Independent  Assistive Device None  Distance Ambulated (ft) 500 ft  Activity Response Tolerated well  Mobility Referral Yes  $Mobility charge 1 Mobility   Pt received in bed and agreeable to mobility. No complaints during session. Pt to bed after session with all needs met.    [employer]

## 2022-09-09 NOTE — Progress Notes (Signed)
Progress Note     Subjective: Pt reported increased abdominal distention and pain overnight. Still having loose BMs but not really passing flatus. Denies nausea but distention worsened after taking in CLD yesterday afternoon/evening  Objective: Vital signs in last 24 hours: Temp:  [97.5 F (36.4 C)-98.7 F (37.1 C)] 97.7 F (36.5 C) (03/21 0523) Pulse Rate:  [55-106] 85 (03/21 0523) Resp:  [16] 16 (03/21 0523) BP: (123-156)/(59-94) 123/59 (03/21 0523) SpO2:  [96 %-98 %] 97 % (03/21 0523) Last BM Date : 09/08/22  Intake/Output from previous day: 03/20 0701 - 03/21 0700 In: 1417.7 [P.O.:480; I.V.:937.7] Out: -  Intake/Output this shift: No intake/output data recorded.  PE: General: pleasant, WD, WN male who is laying in bed in NAD Heart: regular, rate, and rhythm.   Lungs: CTAB, no wheezes, rhonchi, or rales noted.  Respiratory effort nonlabored Abd: soft, NT, distended, +BS Psych: A&Ox3 with an appropriate affect.    Lab Results:  Recent Labs    09/08/22 0450 09/09/22 0445  WBC 6.4 11.5*  HGB 10.8* 11.7*  HCT 33.8* 37.8*  PLT 263 284    BMET Recent Labs    09/08/22 0450 09/09/22 0445  NA 140 139  K 3.2* 4.0  CL 104 103  CO2 27 25  GLUCOSE 92 97  BUN 36* 31*  CREATININE 1.22 1.29*  CALCIUM 8.3* 8.7*    PT/INR Recent Labs    09/09/22 0445  LABPROT 15.1  INR 1.2   CMP     Component Value Date/Time   NA 139 09/09/2022 0445   K 4.0 09/09/2022 0445   CL 103 09/09/2022 0445   CO2 25 09/09/2022 0445   GLUCOSE 97 09/09/2022 0445   BUN 31 (H) 09/09/2022 0445   CREATININE 1.29 (H) 09/09/2022 0445   CALCIUM 8.7 (L) 09/09/2022 0445   PROT 5.7 (L) 09/09/2022 0445   ALBUMIN 3.5 09/09/2022 0445   AST 46 (H) 09/09/2022 0445   ALT 71 (H) 09/09/2022 0445   ALKPHOS 60 09/09/2022 0445   BILITOT 2.4 (H) 09/09/2022 0445   GFRNONAA 59 (L) 09/09/2022 0445   GFRAA >60 02/06/2020 0436   Lipase     Component Value Date/Time   LIPASE 22 09/07/2022 1534        Studies/Results: CT ABDOMEN PELVIS W CONTRAST  Result Date: 09/07/2022 CLINICAL DATA:  Vomiting, possible bowel obstruction EXAM: CT ABDOMEN AND PELVIS WITH CONTRAST TECHNIQUE: Multidetector CT imaging of the abdomen and pelvis was performed using the standard protocol following bolus administration of intravenous contrast. RADIATION DOSE REDUCTION: This exam was performed according to the departmental dose-optimization program which includes automated exposure control, adjustment of the mA and/or kV according to patient size and/or use of iterative reconstruction technique. CONTRAST:  176mL OMNIPAQUE IOHEXOL 300 MG/ML  SOLN COMPARISON:  08/18/2022 FINDINGS: Lower chest: Descending thoracic stent graft partially visualized, stable in appearance. No pleural or pericardial effusion. Lung bases clear. Hepatobiliary: Gallbladder physiologically distended without wall thickening or regional inflammatory change. Stable hepatic cyst segment 4A. No new liver lesion or biliary ductal dilatation. Pancreas: Parenchymal atrophy without mass or ductal dilatation. No regional inflammatory change. Spleen: Normal in size without focal abnormality. Adrenals/Urinary Tract: No adrenal mass. 2.6 cm 21 HU exophytic mid right renal cyst; no follow-up warranted. Symmetric scratch the no hydronephrosis or urolithiasis. Urinary bladder physiologically distended. Stomach/Bowel: Stomach is decompressed. Duodenum and proximal small bowel decompressed. Mid and distal small bowel is dilated with gas and fluid. There is marked dilatation of the cecum up  to 10.1 cm, and gaseous dilatation of the remainder of the colon through the sigmoid segment. There transition to a constricted somewhat irregular thick-walled enhancing segment in the distal mid/distal rectum over length of at least 2.7 cm, with irregular peripheral margins extending into perirectal fat posterolaterally on the right. Vascular/Lymphatic: Moderate aortoiliac  calcified atheromatous plaque. Portal vein patent. No definite mesenteric, pelvic, or retroperitoneal adenopathy. Reproductive: Prostate is unremarkable. Other: Small volume pelvic and right upper abdominal ascites. No free air. Musculoskeletal: Multilevel lumbar spondylitic change. No fracture or worrisome lesion. IMPRESSION: 1. Suspected distal colonic obstruction secondary to 2.7 cm mid/distal rectal mass. Correlate with physical exam. 2. No regional adenopathy or evidence of distal metastatic disease. 3. Small volume pelvic and right upper abdominal ascites. 4.  Aortic Atherosclerosis (ICD10-I70.0). Electronically Signed   By: Lucrezia Europe M.D.   On: 09/07/2022 17:49    Anti-infectives: Anti-infectives (From admission, onward)    None        Assessment/Plan  Rectal adenocarcinoma with LBO - CT yesterday with 2.7 cm rectal mass and likely colonic obstruction  - pt with worsened distention and pain overnight  - discussed with GI yesterday and not a candidate for stent secondary to location of lesion  - will plan lap vs open diverting loop colostomy and port placement in the OR tomorrow - WOC consult for marking today - ok to have ice chips and sips for comfort but make NPO if any worsening nausea or distention   FEN: sips and ice chips  VTE: eliquis on hold  ID: no current abx  - per TRH -  HTN A. Fib on Eliquis - currently on hold  CKD stage II Chronic systolic CHF BPH Anemia of chronic disease  Hx of abdominal aortic dissection s/p stent graft  Hx of tobacco abuse  LOS: 2 days   I reviewed Consultant GI and oncology notes, hospitalist notes, last 24 h vitals and pain scores, last 48 h intake and output, and last 24 h labs and trends.     Norm Parcel, [facility] Surgery 09/09/2022, 8:56 AM Please see Amion for pager number during day hours 7:00am-4:30pm

## 2022-09-09 NOTE — Progress Notes (Signed)
Wife at bedside wit patient. Asks staff if we can take bed alarm off of patient because she will be here all night helping him. Explained reasoning for bed alarm. Patient and his wife verbalizes understanding but still wants bed alarm off. Advised wife that if she leaves to please let staff be aware so we can turn his alarm back on for safety. Also advised family to call staff for assistance when needed.

## 2022-09-09 NOTE — Consult Note (Addendum)
[place] Nurse requested for preoperative stoma site marking  Discussed surgical procedure and stoma creation with patient and family.  Explained role of the [place] nurse team.  Provided the patient with educational booklet and provided samples of pouching options.  Answered patient and family member's questions.   Examined patient lying and sitting, in order to place the marking in the patient's visual field, away from any creases or abdominal contour issues and within the rectus muscle.  Attempted to mark below the patient's belt line, but this was not possible, since a significant crease occurs lower on the abd when the patient leans forward which should be avoided if possible.  His abd is very tight and distended and this limits the ability to determine where a crease might occur later after surgery.   Marked for colostomy in the LLQ  __7__ cm to the left of the umbilicus and [id number] above the umbilicus.  Marked for ileostomy in the RLQ  __7__cm to the right of the umbilicus and  [id number] cm above the umbilicus.  Pt plans to go to surgery tomorrow so I did not apply a thin film dressing.  [place] Nurse team will follow up with patient after surgery for continued ostomy care and teaching.  Thank-you,  [name] MSN, [place], [place], [place], [place]

## 2022-09-09 NOTE — Progress Notes (Signed)
  Progress Note   Patient: Scott [name] [id number] DOB: 1951-02-24 DOA: 09/07/2022     2 DOS: the patient was seen and examined on 09/09/2022 at 8:11AM      Brief hospital course: Scott [name] is a 72 y.o. 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  In the ER, CT showed suspected distal colonic obstruction secondary to 2.7 cm mid/distal rectal mass.    3/19: Admitted, Gen Surg consulted, felt he had failed nonoperative management 3/20: GI consulted, unable to stent 3/21: Gen Surg planning for surgery tomorrow     Assessment and Plan: * Large bowel obstruction ([place]) Took clears yesterday but developed abdominal distension/bloating.  GI evaluated patient, he is not a candidate for stent due to the location of the narrowing. - NPO - IV Fluids - Gen Surg plan for diverting colostomy tomorrow     Normocytic anemia Hgb stable, no clinical bleeding  Colon cancer ([place]) - Follow up with Surgery and Oncology after discharge  PVD (peripheral vascular disease) ([place]) Type B aortic dissection s/p endovascular repair in 2021  Elevated LFTs Improving - Trend LFTs  Stage 3a chronic kidney disease (CKD) ([name]) Cr stable relative to baseline, kidney injury ruled out  Persistent atrial fibrillation ([name]) Not on Eliquis - Continue labetalol  Hypokalemia Supplemented and ersolved  Essential hypertension BP normal - Hold Entresto and spironolacotne for now - Continue Labetalol          Subjective: No additional distention, no vomiting.  Pain is mostly gone.  He had some bloating yesterday after taking some clears, but he attributes that to IV fluids.  General surgery recommended colostomy placement tomorrow     Physical Exam: BP (!) 123/59 (BP Location: Left Arm)   Pulse 85   Temp 97.7 F (36.5 C) (Oral)   Resp 16   Ht 5\' 6"  (1.676 m)   Wt 76 kg    SpO2 97%   BMI 27.04 kg/m   Elderly adult male, lying in bed, no acute distress RRR, no murmurs, no peripheral edema Respiratory rate normal, lungs clear without rales or wheezes Abdomen slightly distended, about the same as yesterday, no ascites at all, no tenderness palpation Attentive to conversation, face symmetric, speech fluent, moves all extremities with normal strength and coordination    Data Reviewed: Discussed with general surgery team CBC shows a mild anemia, white blood cell count 11 Comprehensive metabolic panel shows creatinine 1.2, LFTs slightly improving INR normal    Family Communication: Wife at the bedside    Disposition: Status is: Inpatient         Author: Edwin Dada, MD 09/09/2022 12:26 PM  For on call review [email or web address].

## 2022-09-09 NOTE — Progress Notes (Signed)
Mobility Specialist - Progress Note   09/09/22 1357  Mobility  Activity Ambulated independently in hallway  Level of Assistance Independent  Assistive Device None  Distance Ambulated (ft) 500 ft  Activity Response Tolerated well  Mobility Referral Yes  $Mobility charge 1 Mobility   Pt received in bed and agreeable to mobility. No complaints during session. Pt to bed after session with all needs met.   [facility]

## 2022-09-10 ENCOUNTER — Inpatient Hospital Stay (HOSPITAL_COMMUNITY): Payer: No Typology Code available for payment source

## 2022-09-10 ENCOUNTER — Inpatient Hospital Stay (HOSPITAL_COMMUNITY): Payer: No Typology Code available for payment source | Admitting: Anesthesiology

## 2022-09-10 ENCOUNTER — Encounter (HOSPITAL_COMMUNITY)
Admission: EM | Disposition: A | Discharge status: Home Health | Payer: Self-pay | Source: Home / Self Care | Attending: Family Medicine

## 2022-09-10 ENCOUNTER — Other Ambulatory Visit: Payer: Self-pay

## 2022-09-10 ENCOUNTER — Encounter (HOSPITAL_COMMUNITY): Payer: Self-pay | Admitting: Internal Medicine

## 2022-09-10 DIAGNOSIS — C189 Malignant neoplasm of colon, unspecified: Secondary | ICD-10-CM | POA: Diagnosis not present

## 2022-09-10 DIAGNOSIS — I4891 Unspecified atrial fibrillation: Secondary | ICD-10-CM

## 2022-09-10 DIAGNOSIS — K3532 Acute appendicitis with perforation and localized peritonitis, without abscess: Secondary | ICD-10-CM

## 2022-09-10 DIAGNOSIS — I10 Essential (primary) hypertension: Secondary | ICD-10-CM | POA: Diagnosis not present

## 2022-09-10 DIAGNOSIS — E876 Hypokalemia: Secondary | ICD-10-CM | POA: Diagnosis not present

## 2022-09-10 DIAGNOSIS — Z87891 Personal history of nicotine dependence: Secondary | ICD-10-CM | POA: Diagnosis not present

## 2022-09-10 DIAGNOSIS — K56609 Unspecified intestinal obstruction, unspecified as to partial versus complete obstruction: Secondary | ICD-10-CM | POA: Diagnosis not present

## 2022-09-10 DIAGNOSIS — L509 Urticaria, unspecified: Secondary | ICD-10-CM | POA: Insufficient documentation

## 2022-09-10 DIAGNOSIS — R7989 Other specified abnormal findings of blood chemistry: Secondary | ICD-10-CM | POA: Diagnosis not present

## 2022-09-10 DIAGNOSIS — D649 Anemia, unspecified: Secondary | ICD-10-CM

## 2022-09-10 HISTORY — PX: COLON RESECTION: SHX5231

## 2022-09-10 HISTORY — PX: PORTACATH PLACEMENT: SHX2246

## 2022-09-10 LAB — CBC
HCT: 33.9 % — ABNORMAL LOW (ref 39.0–52.0)
Hemoglobin: 10.8 g/dL — ABNORMAL LOW (ref 13.0–17.0)
MCH: 31.1 pg (ref 26.0–34.0)
MCHC: 31.9 g/dL (ref 30.0–36.0)
MCV: 97.7 fL (ref 80.0–100.0)
Platelets: 240 10*3/uL (ref 150–400)
RBC: 3.47 MIL/uL — ABNORMAL LOW (ref 4.22–5.81)
RDW: 14.5 % (ref 11.5–15.5)
WBC: 8.6 10*3/uL (ref 4.0–10.5)
nRBC: 0 % (ref 0.0–0.2)

## 2022-09-10 LAB — BASIC METABOLIC PANEL
Anion gap: 11 (ref 5–15)
BUN: 25 mg/dL — ABNORMAL HIGH (ref 8–23)
CO2: 22 mmol/L (ref 22–32)
Calcium: 8.4 mg/dL — ABNORMAL LOW (ref 8.9–10.3)
Chloride: 104 mmol/L (ref 98–111)
Creatinine, Ser: 1.11 mg/dL (ref 0.61–1.24)
GFR, Estimated: >60 mL/min (ref >60)
Glucose, Bld: 95 mg/dL (ref 70–99)
Potassium: 4.4 mmol/L (ref 3.5–5.1)
Sodium: 137 mmol/L (ref 135–145)

## 2022-09-10 SURGERY — COLON RESECTION LAPAROSCOPIC
Anesthesia: General

## 2022-09-10 MED ORDER — LACTATED RINGERS IV SOLN: INTRAVENOUS | Status: DC

## 2022-09-10 MED ORDER — ONDANSETRON HCL 4 MG/2ML IJ SOLN
INTRAMUSCULAR | Status: DC | PRN
  Administered 2022-09-10: 4 mg via INTRAVENOUS

## 2022-09-10 MED ORDER — MIDAZOLAM HCL 5 MG/5ML IJ SOLN
INTRAMUSCULAR | Status: DC | PRN
  Administered 2022-09-10: 1 mg via INTRAVENOUS

## 2022-09-10 MED ORDER — ALBUMIN HUMAN 5 % IV SOLN
INTRAVENOUS | Status: AC
  Filled 2022-09-10: qty 250

## 2022-09-10 MED ORDER — 0.9 % SODIUM CHLORIDE (POUR BTL) OPTIME
TOPICAL | Status: DC | PRN
  Administered 2022-09-10: 1000 mL

## 2022-09-10 MED ORDER — AMISULPRIDE (ANTIEMETIC) 5 MG/2ML IV SOLN: 10.0000 mg | Freq: Once | INTRAVENOUS | Status: DC | PRN

## 2022-09-10 MED ORDER — HEPARIN 6000 UNIT IRRIGATION SOLUTION
Status: DC | PRN
  Administered 2022-09-10: 1

## 2022-09-10 MED ORDER — PHENYLEPHRINE HCL-NACL 20-0.9 MG/250ML-% IV SOLN
INTRAVENOUS | Status: DC | PRN
  Administered 2022-09-10: 40 ug/min via INTRAVENOUS

## 2022-09-10 MED ORDER — DEXAMETHASONE SODIUM PHOSPHATE 10 MG/ML IJ SOLN
INTRAMUSCULAR | Status: DC | PRN
  Administered 2022-09-10: 8 mg via INTRAVENOUS

## 2022-09-10 MED ORDER — HYDROMORPHONE HCL 2 MG/ML IJ SOLN
INTRAMUSCULAR | Status: AC
  Filled 2022-09-10: qty 1

## 2022-09-10 MED ORDER — DIPHENHYDRAMINE HCL 50 MG/ML IJ SOLN
INTRAMUSCULAR | Status: DC | PRN
  Administered 2022-09-10: 12.5 mg via INTRAVENOUS

## 2022-09-10 MED ORDER — FENTANYL CITRATE (PF) 100 MCG/2ML IJ SOLN
INTRAMUSCULAR | Status: DC | PRN
  Administered 2022-09-10 (×2): 50 ug via INTRAVENOUS
  Administered 2022-09-10: 100 ug via INTRAVENOUS

## 2022-09-10 MED ORDER — HYDROMORPHONE HCL 1 MG/ML IJ SOLN
INTRAMUSCULAR | Status: DC | PRN
  Administered 2022-09-10 (×2): .5 mg via INTRAVENOUS

## 2022-09-10 MED ORDER — LACTATED RINGERS IV SOLN: INTRAVENOUS | Status: DC | PRN

## 2022-09-10 MED ORDER — FENTANYL CITRATE PF 50 MCG/ML IJ SOSY
PREFILLED_SYRINGE | INTRAMUSCULAR | Status: AC
  Filled 2022-09-10: qty 1

## 2022-09-10 MED ORDER — SODIUM CHLORIDE 0.9% FLUSH: 10.0000 mL | INTRAVENOUS | Status: DC | PRN

## 2022-09-10 MED ORDER — HEPARIN 6000 UNIT IRRIGATION SOLUTION
Freq: Once | Status: DC
  Filled 2022-09-10: qty 500

## 2022-09-10 MED ORDER — BUPIVACAINE LIPOSOME 1.3 % IJ SUSP
INTRAMUSCULAR | Status: AC
  Filled 2022-09-10: qty 20

## 2022-09-10 MED ORDER — PHENYLEPHRINE 80 MCG/ML (10ML) SYRINGE FOR IV PUSH (FOR BLOOD PRESSURE SUPPORT)
PREFILLED_SYRINGE | INTRAVENOUS | Status: AC
  Filled 2022-09-10: qty 10

## 2022-09-10 MED ORDER — HEPARIN SOD (PORK) LOCK FLUSH 100 UNIT/ML IV SOLN
INTRAVENOUS | Status: AC
  Filled 2022-09-10: qty 5

## 2022-09-10 MED ORDER — SODIUM CHLORIDE 0.9 % IR SOLN
Status: DC | PRN
  Administered 2022-09-10: 3000 mL

## 2022-09-10 MED ORDER — METOPROLOL TARTRATE 5 MG/5ML IV SOLN
INTRAVENOUS | Status: AC
  Filled 2022-09-10: qty 5

## 2022-09-10 MED ORDER — FENTANYL CITRATE PF 50 MCG/ML IJ SOSY
25.0000 ug | PREFILLED_SYRINGE | INTRAMUSCULAR | Status: DC | PRN
  Administered 2022-09-10: 25 ug via INTRAVENOUS

## 2022-09-10 MED ORDER — METOPROLOL TARTRATE 5 MG/5ML IV SOLN
3.0000 mg | Freq: Once | INTRAVENOUS | Status: AC
  Administered 2022-09-10: 3 mg via INTRAVENOUS

## 2022-09-10 MED ORDER — PIPERACILLIN-TAZOBACTAM 3.375 G IVPB
3.3750 g | Freq: Three times a day (TID) | INTRAVENOUS | Status: AC
  Administered 2022-09-10 – 2022-09-15 (×16): 3.375 g via INTRAVENOUS
  Filled 2022-09-10 (×16): qty 50

## 2022-09-10 MED ORDER — DIPHENHYDRAMINE HCL 50 MG/ML IJ SOLN
12.5000 mg | Freq: Four times a day (QID) | INTRAMUSCULAR | Status: DC | PRN
  Administered 2022-09-12 – 2022-09-13 (×2): 12.5 mg via INTRAVENOUS
  Filled 2022-09-10 (×3): qty 1

## 2022-09-10 MED ORDER — CHLORHEXIDINE GLUCONATE CLOTH 2 % EX PADS
6.0000 | MEDICATED_PAD | Freq: Every day | CUTANEOUS | Status: DC
  Administered 2022-09-10 – 2022-09-18 (×8): 6 via TOPICAL

## 2022-09-10 MED ORDER — SUGAMMADEX SODIUM 200 MG/2ML IV SOLN
INTRAVENOUS | Status: DC | PRN
  Administered 2022-09-10: 200 mg via INTRAVENOUS

## 2022-09-10 MED ORDER — FENTANYL CITRATE (PF) 250 MCG/5ML IJ SOLN
INTRAMUSCULAR | Status: AC
  Filled 2022-09-10: qty 5

## 2022-09-10 MED ORDER — BUPIVACAINE-EPINEPHRINE 0.25% -1:200000 IJ SOLN
INTRAMUSCULAR | Status: DC | PRN
  Administered 2022-09-10: 10 mL

## 2022-09-10 MED ORDER — BUPIVACAINE LIPOSOME 1.3 % IJ SUSP
INTRAMUSCULAR | Status: DC | PRN
  Administered 2022-09-10: 10 mL

## 2022-09-10 MED ORDER — ALBUMIN HUMAN 5 % IV SOLN: INTRAVENOUS | Status: DC | PRN

## 2022-09-10 MED ORDER — PROPOFOL 10 MG/ML IV BOLUS
INTRAVENOUS | Status: AC
  Filled 2022-09-10: qty 20

## 2022-09-10 MED ORDER — MIDAZOLAM HCL 2 MG/2ML IJ SOLN
INTRAMUSCULAR | Status: AC
  Filled 2022-09-10: qty 4

## 2022-09-10 MED ORDER — ACETAMINOPHEN 10 MG/ML IV SOLN
1000.0000 mg | Freq: Four times a day (QID) | INTRAVENOUS | Status: AC
  Administered 2022-09-10 – 2022-09-11 (×4): 1000 mg via INTRAVENOUS
  Filled 2022-09-10 (×4): qty 100

## 2022-09-10 MED ORDER — ONDANSETRON HCL 4 MG/2ML IJ SOLN
INTRAMUSCULAR | Status: AC
  Filled 2022-09-10: qty 2

## 2022-09-10 MED ORDER — LACTATED RINGERS IR SOLN
Status: DC | PRN
  Administered 2022-09-10: 1000 mL

## 2022-09-10 MED ORDER — LIDOCAINE HCL (CARDIAC) PF 100 MG/5ML IV SOSY
PREFILLED_SYRINGE | INTRAVENOUS | Status: DC | PRN
  Administered 2022-09-10: 60 mg via INTRAVENOUS

## 2022-09-10 MED ORDER — HEPARIN SOD (PORK) LOCK FLUSH 100 UNIT/ML IV SOLN
INTRAVENOUS | Status: DC | PRN
  Administered 2022-09-10: 500 [IU]

## 2022-09-10 MED ORDER — BUPIVACAINE-EPINEPHRINE (PF) 0.25% -1:200000 IJ SOLN
INTRAMUSCULAR | Status: AC
  Filled 2022-09-10: qty 30

## 2022-09-10 MED ORDER — DIPHENHYDRAMINE HCL 12.5 MG/5ML PO ELIX: 12.5000 mg | ORAL_SOLUTION | Freq: Four times a day (QID) | ORAL | Status: DC | PRN

## 2022-09-10 MED ORDER — PROPOFOL 10 MG/ML IV BOLUS
INTRAVENOUS | Status: DC | PRN
  Administered 2022-09-10: 140 mg via INTRAVENOUS

## 2022-09-10 MED ORDER — ROCURONIUM BROMIDE 100 MG/10ML IV SOLN
INTRAVENOUS | Status: DC | PRN
  Administered 2022-09-10: 20 mg via INTRAVENOUS
  Administered 2022-09-10: 5 mg via INTRAVENOUS
  Administered 2022-09-10: 40 mg via INTRAVENOUS
  Administered 2022-09-10: 10 mg via INTRAVENOUS
  Administered 2022-09-10: 30 mg via INTRAVENOUS

## 2022-09-10 MED ORDER — PHENYLEPHRINE HCL (PRESSORS) 10 MG/ML IV SOLN
INTRAVENOUS | Status: DC | PRN
  Administered 2022-09-10: 80 ug via INTRAVENOUS
  Administered 2022-09-10: 160 ug via INTRAVENOUS
  Administered 2022-09-10: 80 ug via INTRAVENOUS

## 2022-09-10 SURGICAL SUPPLY — 96 items
ADH SKN CLS APL DERMABOND .7 (GAUZE/BANDAGES/DRESSINGS) ×1
APL PRP STRL LF DISP 70% ISPRP (MISCELLANEOUS) ×1
APPLIER CLIP 5 13 M/L LIGAMAX5 (MISCELLANEOUS)
APR CLP MED LRG 5 ANG JAW (MISCELLANEOUS)
BAG COUNTER SPONGE SURGICOUNT (BAG) ×1 IMPLANT
BAG DECANTER FOR FLEXI CONT (MISCELLANEOUS) ×1 IMPLANT
BAG SPNG CNTER NS LX DISP (BAG) ×1
BLADE EXTENDED COATED 6.5IN (ELECTRODE) IMPLANT
BLADE SURG 15 STRL LF DISP TIS (BLADE) ×1 IMPLANT
BLADE SURG 15 STRL SS (BLADE) ×1
CABLE HIGH FREQUENCY MONO STRZ (ELECTRODE) IMPLANT
CANISTER WOUNDNEG PRESSURE 500 (CANNISTER) IMPLANT
CELLS DAT CNTRL 66122 CELL SVR (MISCELLANEOUS) IMPLANT
CHLORAPREP W/TINT 26 (MISCELLANEOUS) ×1 IMPLANT
CLIP APPLIE 5 13 M/L LIGAMAX5 (MISCELLANEOUS) IMPLANT
DERMABOND ADVANCED .7 DNX12 (GAUZE/BANDAGES/DRESSINGS) ×1 IMPLANT
DRAIN CHANNEL 19F RND (DRAIN) IMPLANT
DRAIN RELI 100 BL SUC LF ST (DRAIN)
DRAPE C-ARM 42X120 X-RAY (DRAPES) ×1 IMPLANT
DRAPE LAPAROSCOPIC ABDOMINAL (DRAPES) ×1 IMPLANT
DRAPE LAPAROTOMY TRNSV 102X78 (DRAPES) ×1 IMPLANT
DRAPE SURG IRRIG POUCH 19X23 (DRAPES) ×1 IMPLANT
DRSG OPSITE POSTOP 4X10 (GAUZE/BANDAGES/DRESSINGS) IMPLANT
DRSG OPSITE POSTOP 4X6 (GAUZE/BANDAGES/DRESSINGS) IMPLANT
DRSG OPSITE POSTOP 4X8 (GAUZE/BANDAGES/DRESSINGS) IMPLANT
DRSG VAC GRANUFOAM MED (GAUZE/BANDAGES/DRESSINGS) IMPLANT
ELECT REM PT RETURN 15FT ADLT (MISCELLANEOUS) ×1 IMPLANT
EVACUATOR SILICONE 100CC (DRAIN) IMPLANT
GAUZE 4X4 16PLY ~~LOC~~+RFID DBL (SPONGE) ×1 IMPLANT
GAUZE SPONGE 4X4 12PLY STRL (GAUZE/BANDAGES/DRESSINGS) IMPLANT
GLOVE BIO SURGEON STRL SZ 6.5 (GLOVE) ×2 IMPLANT
GLOVE BIOGEL PI IND STRL 7.0 (GLOVE) ×2 IMPLANT
GLOVE INDICATOR 6.5 STRL GRN (GLOVE) ×1 IMPLANT
GOWN STRL REUS W/ TWL XL LVL3 (GOWN DISPOSABLE) ×6 IMPLANT
GOWN STRL REUS W/TWL XL LVL3 (GOWN DISPOSABLE) ×6
HANDLE SUCTION POOLE (INSTRUMENTS) IMPLANT
HOLDER FOLEY CATH W/STRAP (MISCELLANEOUS) ×1 IMPLANT
IRRIG SUCT STRYKERFLOW 2 WTIP (MISCELLANEOUS) ×1
IRRIGATION SUCT STRKRFLW 2 WTP (MISCELLANEOUS) ×1 IMPLANT
KIT BASIN OR (CUSTOM PROCEDURE TRAY) ×1 IMPLANT
KIT PORT POWER 8FR ISP CVUE (Port) IMPLANT
KIT TURNOVER KIT A (KITS) IMPLANT
NDL HYPO 21X1.5 SAFETY (NEEDLE) IMPLANT
NDL HYPO 25X1 1.5 SAFETY (NEEDLE) ×1 IMPLANT
NEEDLE HYPO 21X1.5 SAFETY (NEEDLE) ×1 IMPLANT
NEEDLE HYPO 25X1 1.5 SAFETY (NEEDLE) ×1 IMPLANT
PACK BASIC VI WITH GOWN DISP (CUSTOM PROCEDURE TRAY) ×1 IMPLANT
PACK COLON (CUSTOM PROCEDURE TRAY) ×1 IMPLANT
PAD POSITIONING PINK XL (MISCELLANEOUS) ×1 IMPLANT
PENCIL SMOKE EVACUATOR (MISCELLANEOUS) IMPLANT
PROTECTOR NERVE ULNAR (MISCELLANEOUS) ×1 IMPLANT
RELOAD PROXIMATE 75MM BLUE (ENDOMECHANICALS) ×3 IMPLANT
RELOAD STAPLE 75 3.8 BLU REG (ENDOMECHANICALS) IMPLANT
RETRACTOR WND ALEXIS 18 MED (MISCELLANEOUS) IMPLANT
RETRACTOR WND ALEXIS 25 LRG (MISCELLANEOUS) IMPLANT
RTRCTR WOUND ALEXIS 18CM MED (MISCELLANEOUS)
RTRCTR WOUND ALEXIS 25CM LRG (MISCELLANEOUS) ×1
SCISSORS LAP 5X35 DISP (ENDOMECHANICALS) ×1 IMPLANT
SEALER TISSUE G2 STRG ARTC 35C (ENDOMECHANICALS) ×1 IMPLANT
SET TUBE SMOKE EVAC HIGH FLOW (TUBING) ×1 IMPLANT
SLEEVE Z-THREAD 5X100MM (TROCAR) ×1 IMPLANT
SPIKE FLUID TRANSFER (MISCELLANEOUS) ×1 IMPLANT
SPONGE DRAIN TRACH 4X4 STRL 2S (GAUZE/BANDAGES/DRESSINGS) IMPLANT
SPONGE T-LAP 18X18 ~~LOC~~+RFID (SPONGE) IMPLANT
STAPLER PROXIMATE 75MM BLUE (STAPLE) IMPLANT
SUCTION POOLE HANDLE (INSTRUMENTS) ×1
SURGILUBE 2OZ TUBE FLIPTOP (MISCELLANEOUS) ×1 IMPLANT
SUT ETHILON 2 0 PS N (SUTURE) IMPLANT
SUT NOVA NAB DX-16 0-1 5-0 T12 (SUTURE) ×2 IMPLANT
SUT PDS AB 1 CT1 27 (SUTURE) IMPLANT
SUT PROLENE 2 0 KS (SUTURE) IMPLANT
SUT PROLENE 2 0 SH DA (SUTURE) ×1 IMPLANT
SUT SILK 2 0 (SUTURE) ×1
SUT SILK 2 0 SH CR/8 (SUTURE) IMPLANT
SUT SILK 2-0 18XBRD TIE 12 (SUTURE) ×1 IMPLANT
SUT SILK 3 0 (SUTURE)
SUT SILK 3 0 SH CR/8 (SUTURE) ×1 IMPLANT
SUT SILK 3-0 18XBRD TIE 12 (SUTURE) IMPLANT
SUT VIC AB 2-0 SH 18 (SUTURE) ×1 IMPLANT
SUT VIC AB 3-0 SH 27 (SUTURE) ×1
SUT VIC AB 3-0 SH 27XBRD (SUTURE) ×1 IMPLANT
SUT VIC AB 4-0 PS2 18 (SUTURE) ×1 IMPLANT
SUT VIC AB 4-0 PS2 27 (SUTURE) ×1 IMPLANT
SUT VICRYL 0 UR6 27IN ABS (SUTURE) ×1 IMPLANT
SYR 10ML LL (SYRINGE) ×1 IMPLANT
SYR CONTROL 10ML LL (SYRINGE) ×1 IMPLANT
SYS LAPSCP GELPORT 120MM (MISCELLANEOUS)
SYS WOUND ALEXIS 18CM MED (MISCELLANEOUS) ×1
SYSTEM LAPSCP GELPORT 120MM (MISCELLANEOUS) IMPLANT
SYSTEM WOUND ALEXIS 18CM MED (MISCELLANEOUS) ×1 IMPLANT
TOWEL OR 17X26 10 PK STRL BLUE (TOWEL DISPOSABLE) ×1 IMPLANT
TOWEL OR NON WOVEN STRL DISP B (DISPOSABLE) ×1 IMPLANT
TRAY FOLEY MTR SLVR 16FR STAT (SET/KITS/TRAYS/PACK) IMPLANT
TROCAR BALLN 12MMX100 BLUNT (TROCAR) IMPLANT
TROCAR Z-THREAD OPTICAL 5X100M (TROCAR) ×1 IMPLANT
TUBING CONNECTING 10 (TUBING) ×1 IMPLANT

## 2022-09-10 NOTE — Anesthesia Procedure Notes (Signed)
Procedure Name: Intubation Date/Time: 09/10/2022 11:27 AM  Performed by: Garrel Ridgel, CRNAPre-anesthesia Checklist: Patient identified, Emergency Drugs available, Suction available and Patient being monitored Patient Re-evaluated:Patient Re-evaluated prior to induction Oxygen Delivery Method: Circle system utilized Preoxygenation: Pre-oxygenation with 100% oxygen Induction Type: IV induction, Rapid sequence and Cricoid Pressure applied Ventilation: Mask ventilation without difficulty Laryngoscope Size: Mac and 4 Grade View: Grade II Tube type: Oral Tube size: 7.5 mm Number of attempts: 1 Airway Equipment and Method: Stylet Placement Confirmation: ETT inserted through vocal cords under direct vision, positive ETCO2 and breath sounds checked- equal and bilateral Secured at: 22 cm Tube secured with: Tape Dental Injury: Teeth and Oropharynx as per pre-operative assessment

## 2022-09-10 NOTE — Assessment & Plan Note (Signed)
Incidental and unexpected finding intra-operatively. Found cecal perforation, underwent right colectomy with primary anastomosis (in addition to colostomy) - Continue Zosyn

## 2022-09-10 NOTE — Assessment & Plan Note (Signed)
Noted also incidentally post-op.  Possibly due to cefotetan. - Diphenhydramine PRN - Follow up

## 2022-09-10 NOTE — Discharge Instructions (Signed)
[facility]      [facility] [phone]  OPEN ABDOMINAL SURGERY: POST OP INSTRUCTIONS  Always review your discharge instruction sheet given to you by the facility where your surgery was performed.  IF YOU HAVE DISABILITY OR FAMILY LEAVE FORMS, YOU MUST BRING THEM TO THE OFFICE FOR PROCESSING.  PLEASE DO NOT GIVE THEM TO YOUR DOCTOR.  A prescription for pain medication may be given to you upon discharge.  Take your pain medication as prescribed, if needed.  If narcotic pain medicine is not needed, then you may take acetaminophen (Tylenol) or ibuprofen (Advil) as needed. Take your usually prescribed medications unless otherwise directed. If you need a refill on your pain medication, please contact your pharmacy. They will contact our office to request authorization.  Prescriptions will not be filled after 5pm or on week-ends. You should follow a light diet the first few days after arrival home, such as soup and crackers, pudding, etc.unless your doctor has advised otherwise. A high-fiber, low fat diet can be resumed as tolerated.   Be sure to include lots of fluids daily. Most patients will experience some swelling and bruising on the chest and neck area.  Ice packs will help.  Swelling and bruising can take several days to resolve Most patients will experience some swelling and bruising in the area of the incision. Ice pack will help. Swelling and bruising can take several days to resolve..  It is common to experience some constipation if taking pain medication after surgery.  Increasing fluid intake and taking a stool softener will usually help or prevent this problem from occurring.  A mild laxative (Milk of Magnesia or Miralax) should be taken according to package directions if there are no bowel movements after 48 hours.  You may have steri-strips (small skin tapes) in place directly over the incision.  These strips should be left on the skin for 7-10 days.  If your surgeon used skin  glue on the incision, you may shower in 24 hours.  The glue will flake off over the next 2-3 weeks.  Any sutures or staples will be removed at the office during your follow-up visit. You may find that a light gauze bandage over your incision may keep your staples from being rubbed or pulled. You may shower and replace the bandage daily. ACTIVITIES:  You may resume regular (light) daily activities beginning the next day--such as daily self-care, walking, climbing stairs--gradually increasing activities as tolerated.  You may have sexual intercourse when it is comfortable.  Refrain from any heavy lifting or straining until approved by your doctor. You may drive when you no longer are taking prescription pain medication, you can comfortably wear a seatbelt, and you can safely maneuver your car and apply brakes Return to Work: ___________________________________ You should see your doctor in the office for a follow-up appointment approximately two weeks after your surgery.  Make sure that you call for this appointment within a day or two after you arrive home to insure a convenient appointment time. OTHER INSTRUCTIONS:  _____________________________________________________________ _____________________________________________________________  WHEN TO CALL YOUR DOCTOR: Fever over 101.0 Inability to urinate Nausea and/or vomiting Extreme swelling or bruising Continued bleeding from incision. Increased pain, redness, or drainage from the incision. Difficulty swallowing or breathing Muscle cramping or spasms. Numbness or tingling in hands or feet or around lips.  The clinic staff is available to answer your questions during regular business hours.  Please don't hesitate to call and ask to speak to one of   the nurses if you have concerns.  For further questions, please visit [email or web address]  WOUND CARE: - dressing to be changed twice daily - supplies: sterile saline, kerlix/guaze,  scissors, ABD pads, tape  - remove dressing and all packing carefully, moistening with sterile saline as needed to avoid packing/internal dressing sticking to the wound. - clean edges of skin around the wound with water/gauze, making sure there is no tape debris or leakage left on skin that could cause skin irritation or breakdown. - dampen clean kerlix/gauze with sterile saline and pack wound from wound base to skin level, making sure to take note of any possible areas of wound tracking, tunneling and packing appropriately. Wound can be packed loosely. Trim kerlix/gauze to size if a whole roll/piece is not required. - cover wound with a dry ABD pad and secure with tape.  - write the date/time on the dry dressing/tape to better track when the last dressing change occurred. - apply any skin protectant/powder recommended by clinician to protect skin/skin folds. - change dressing as needed if leakage occurs, wound gets contaminated, or patient requests to shower. - patient may shower daily with wound open and following the shower the wound should be dried and a clean dressing placed.

## 2022-09-10 NOTE — Progress Notes (Signed)
Verified with MDA it's OK to take the patient back to his room.  She was ok with current VS and said return him to his room.

## 2022-09-10 NOTE — Plan of Care (Signed)
  Problem: Health Behavior/Discharge Planning: Goal: Ability to manage health-related needs will improve Outcome: Progressing   Problem: Clinical Measurements: Goal: Ability to maintain clinical measurements within normal limits will improve Outcome: Progressing   Problem: Clinical Measurements: Goal: Will remain free from infection Outcome: Progressing   

## 2022-09-10 NOTE — Progress Notes (Signed)
  Progress Note   Patient: Scott [name] [id number] DOB: 01-Dec-1950 DOA: 09/07/2022     3 DOS: the patient was seen and examined on 09/10/2022 at 5:01 PM      Brief hospital course: Mr. Provost is a 72 y.o. 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  In the ER, CT showed suspected distal colonic obstruction secondary to 2.7 cm mid/distal rectal mass.    3/19: Admitted, Gen Surg consulted, felt he had failed nonoperative management 3/20: GI consulted, unable to stent 3/21: Gen Surg planning for surgery tomorrow 3/22: Underwent Colostomy with Dr. Marcello Moores; intra-operatively also found to have cecal perforation incidentally, and underwent right colectomy with primary anastomosis; developed hives post-op     Assessment and Plan: * Large bowel obstruction ([place]) Admitted and Gen Surg/GI consulted.  Unfortunately, not amenable to stenting, so went to OR for colostomy 3/22 - Post op care per Gen Surg   Colon cancer [facility]) - Follow up with Surgery and Oncology after discharge  Perforation of cecum Incidental and unexpected finding intra-operatively. Found cecal perforation, underwent right colectomy with primary anastomosis (in addition to colostomy) - Continue Zosyn  Hives Noted also incidentally post-op.  Possibly due to cefotetan. 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  He is fairly delirious afterwards.  Had some hives, which appear to be resolved already.     Physical Exam: BP 117/71 (BP Location: Right Arm)   Pulse (!) 114   Temp (!) 97.1 F (36.2 C)   Resp 15   Ht 5\' 6"  (1.676 m)   Wt 76 kg   SpO2 97%   BMI 27.04 kg/m   Elderly adult male, NG tube in place, delirious and anxious and crying Tachycardic, regular, no murmurs, no peripheral edema Respiratory rate is increased, no rales or wheezing Abdomen soft, diffusely tender to light palpation, but no guarding or rigidity Makes eye contact and responds to some of my questions, but mostly delirious and anxious and crying, speech fluent, face symmetric, oriented to family, responding to questions, upper extremity strength lower extremity strength appears generally weak but symmetric  Data Reviewed: Discussed with Gen Surg team BMP normal Hgb 10.8 stable  Family Communication: Wife and daughters at the bedside    Disposition: Status is: Inpatient         Author: Edwin Dada, MD 09/10/2022 4:04 PM  For on call review [email or web address].

## 2022-09-10 NOTE — Anesthesia Preprocedure Evaluation (Addendum)
Anesthesia Evaluation  Patient identified by MRN, date of birth, ID band Patient awake    Reviewed: Allergy & Precautions, NPO status , Patient's Chart, lab work & pertinent test results  Airway Mallampati: II  TM Distance: >3 FB Neck ROM: Full    Dental  (+) Dental Advisory Given   Pulmonary former smoker   breath sounds clear to auscultation       Cardiovascular hypertension, Pt. on medications and Pt. on home beta blockers + Peripheral Vascular Disease (s/p type B aortic dissection with thoracic stent graft placement. Stable 4.1 cm ascending aortic aneurysm)  + dysrhythmias Atrial Fibrillation  Rhythm:Regular Rate:Normal     Neuro/Psych negative neurological ROS     GI/Hepatic Neg liver ROS,,,Colon mass with obstruction   Endo/Other  negative endocrine ROS    Renal/GU CRFRenal disease     Musculoskeletal   Abdominal   Peds  Hematology  (+) Blood dyscrasia, anemia   Anesthesia Other Findings   Reproductive/Obstetrics                             Anesthesia Physical Anesthesia Plan  ASA: 3  Anesthesia Plan: General   Post-op Pain Management: Tylenol PO (pre-op)* and Ketamine IV*   Induction: Intravenous and Rapid sequence  PONV Risk Score and Plan: 2 and Dexamethasone, Ondansetron and Treatment may vary due to age or medical condition  Airway Management Planned: Oral ETT  Additional Equipment:   Intra-op Plan:   Post-operative Plan: Extubation in OR  Informed Consent: I have reviewed the patients History and Physical, chart, labs and discussed the procedure including the risks, benefits and alternatives for the proposed anesthesia with the patient or authorized representative who has indicated his/her understanding and acceptance.     Dental advisory given  Plan Discussed with: CRNA  Anesthesia Plan Comments:        Anesthesia Quick Evaluation

## 2022-09-10 NOTE — Progress Notes (Signed)
Pharmacy Antibiotic Note  Scott [name] is a 72 y.o. male with rectal adenocarcinoma admitted on 09/07/2022 now s/p diverting loop colostomy, right colectomy and port placement.  Pharmacy has been consulted for Zosyn dosing.  Plan: Zosyn 3.375gm IV q8h (4hr extended infusions) No dose adjustments anticipated.  Pharmacy will sign off and monitor peripherally via electronic surveillance software for any changes in renal function or micro data.    Height: 5\' 6"  (167.6 cm) Weight: 76 kg (167 lb 8 oz) IBW/kg (Calculated) : 63.8  Temp (24hrs), Avg:97.8 F (36.6 C), Min:97.6 F (36.4 C), Max:98.1 F (36.7 C)  Recent Labs  Lab 09/07/22 1534 09/08/22 0450 09/09/22 0445 09/10/22 0510  WBC 8.5 6.4 11.5* 8.6  CREATININE 1.26* 1.22 1.29* 1.11    Estimated Creatinine Clearance: 53.5 mL/min (by C-G formula based on SCr of 1.11 mg/dL).    Allergies  Allergen Reactions   Amlodipine Itching   Carvedilol Other (See Comments)    Night terrors   Hydralazine Other (See Comments)    Night terrors, fatigued   Valsartan Other (See Comments)    Night terrors and fatigued    Antimicrobials this admission: 3/22 Cefotan x 1 3/22 Zosyn >>  Dose adjustments this admission:  Microbiology results: 3/22 Tissue:  Thank you for allowing pharmacy to be a part of this patient's care.  Peggyann Juba, PharmD, BCPS Pharmacy: [phone] 09/10/2022 3:14 PM

## 2022-09-10 NOTE — Transfer of Care (Signed)
Immediate Anesthesia Transfer of Care Note  Patient: Scott [name]  Procedure(s) Performed: LAPAROSCOPIC ASSISTED OSTOMY PLACEMENT INSERTION PORT-A-CATH  Patient Location: PACU  Anesthesia Type:General  Level of Consciousness: sedated  Airway & Oxygen Therapy: Patient Spontanous Breathing and Patient connected to face mask oxygen  Post-op Assessment: Report given to RN and Post -op Vital signs reviewed and stable  Post vital signs: Reviewed and stable  Last Vitals:  Vitals Value Taken Time  BP 117/71 09/10/22 1515  Temp    Pulse 61 09/10/22 1514  Resp 25 09/10/22 1516  SpO2 100 % 09/10/22 1514  Vitals shown include unvalidated device data.  Last Pain:  Vitals:   09/10/22 0928  TempSrc: Oral  PainSc:       Patients Stated Pain Goal: 2 ([id number])  Complications: No notable events documented.

## 2022-09-10 NOTE — Progress Notes (Signed)
When patient arrived from OR very difficult to remain in bed and follow directions. Patient trying to pull NGT, patient requiring this RN and another RN and 2 NT's to keep him in the bed and from pulling important equipment.  Asked MDA if we could put the patient in restraints and the MDA did not want to do that at this time she wanted him to wake up a little bit more.  Asked MDA if patient had been in Afib during the case because this RN didn't see it in his history.  She confirmed that he had been and had a history of it.  Confirmed for her that the rash is improving.  Will continue to monitor and notify for changes.

## 2022-09-10 NOTE — Progress Notes (Signed)
MDA at bedside to see how patients rash was and HR.  Rash has drastically improved on his LE and has improved on his upper extremities.  HR still consistently 110's to 120's.  MDA wants to give IV metoprolol.  Will administer and continue to monitor.

## 2022-09-10 NOTE — Progress Notes (Signed)
Progress Note  Day of Surgery  Subjective: No issues overnight, marked for ostomy  Objective: Vital signs in last 24 hours: Temp:  [97.6 F (36.4 C)-98.1 F (36.7 C)] 98 F (36.7 C) (03/22 0528) Pulse Rate:  [55-108] 89 (03/22 0528) Resp:  [17-22] 18 (03/22 0528) BP: (129-159)/(70-89) 129/77 (03/22 0528) SpO2:  [98 %-99 %] 98 % (03/22 0528) Last BM Date : 09/08/22  Intake/Output from previous day: 03/21 0701 - 03/22 0700 In: 1424.9 [P.O.:270; I.V.:1154.9] Out: 625 [Urine:625] Intake/Output this shift: No intake/output data recorded.  PE: General: pleasant, WD, WN male who is laying in bed in NAD Heart: regular, rate, and rhythm.   Lungs: CTAB, no wheezes, rhonchi, or rales noted.  Respiratory effort nonlabored Abd: soft, NT, distended Psych: A&Ox3 with an appropriate affect.    Lab Results:  Recent Labs    09/09/22 0445 09/10/22 0510  WBC 11.5* 8.6  HGB 11.7* 10.8*  HCT 37.8* 33.9*  PLT 284 240    BMET Recent Labs    09/09/22 0445 09/10/22 0510  NA 139 137  K 4.0 4.4  CL 103 104  CO2 25 22  GLUCOSE 97 95  BUN 31* 25*  CREATININE 1.29* 1.11  CALCIUM 8.7* 8.4*    PT/INR Recent Labs    09/09/22 0445  LABPROT 15.1  INR 1.2    CMP     Component Value Date/Time   NA 137 09/10/2022 0510   K 4.4 09/10/2022 0510   CL 104 09/10/2022 0510   CO2 22 09/10/2022 0510   GLUCOSE 95 09/10/2022 0510   BUN 25 (H) 09/10/2022 0510   CREATININE 1.11 09/10/2022 0510   CALCIUM 8.4 (L) 09/10/2022 0510   PROT 5.7 (L) 09/09/2022 0445   ALBUMIN 3.5 09/09/2022 0445   AST 46 (H) 09/09/2022 0445   ALT 71 (H) 09/09/2022 0445   ALKPHOS 60 09/09/2022 0445   BILITOT 2.4 (H) 09/09/2022 0445   GFRNONAA >60 09/10/2022 0510   GFRAA >60 02/06/2020 0436   Lipase     Component Value Date/Time   LIPASE 22 09/07/2022 1534       Studies/Results: No results found.  Anti-infectives: Anti-infectives (From admission, onward)    Start     Dose/Rate Route  Frequency Ordered Stop   09/10/22 0600  cefoTEtan (CEFOTAN) 2 g in sodium chloride 0.9 % 100 mL IVPB        2 g 200 mL/hr over 30 Minutes Intravenous On call to O.R. 09/09/22 0913 09/11/22 0559        Assessment/Plan  Rectal adenocarcinoma with LBO - CT with 2.7 cm rectal mass and likely colonic obstruction  - pt with worsened distention and pain overnight  - will plan lap vs open diverting loop colostomy and port placement in the OR today: risks include bleeding, infection and damage to adjacent structures. - WOC consult for post op teaching - ok to have ice chips and sips for comfort but make NPO if any worsening nausea or distention   FEN: sips and ice chips  VTE: eliquis on hold  ID: no current abx  - per TRH -  HTN A. Fib on Eliquis - currently on hold  CKD stage II Chronic systolic CHF BPH Anemia of chronic disease  Hx of abdominal aortic dissection s/p stent graft  Hx of tobacco abuse  LOS: 3 days   I reviewed Consultant GI and oncology notes, hospitalist notes, last 24 h vitals and pain scores, last 48 h intake and output,  and last 24 h labs and trends.     Rosario Adie, [facility] 09/10/2022, 9:19 AM Please see Amion for pager number during day hours 7:00am-4:30pm

## 2022-09-10 NOTE — Plan of Care (Deleted)
  Problem: Health Behavior/Discharge Planning: Goal: Ability to manage health-related needs will improve 09/10/2022 0038 by Otilio Jefferson, RN Outcome: Progressing 09/10/2022 0038 by Otilio Jefferson, RN Outcome: Progressing   Problem: Clinical Measurements: Goal: Ability to maintain clinical measurements within normal limits will improve 09/10/2022 0038 by Otilio Jefferson, RN Outcome: Progressing 09/10/2022 0038 by Otilio Jefferson, RN Outcome: Progressing

## 2022-09-10 NOTE — Op Note (Signed)
09/07/2022 - 09/10/2022  12:25 PM  PATIENT:  Scott [name]  72 y.o. male  Patient Care Team: [facility] as PCP - General Fields, Jessy Oto, MD (Inactive) as Consulting Physician (Vascular Surgery) [name], MD as Attending Physician (Cardiology) [facility], [place] DIAGNOSIS:  MALIGNANT BOWEL OBSTRUCTION  POST-OPERATIVE DIAGNOSIS:  MALIGNANT BOWEL OBSTRUCTION,  CECAL PERFORATION  PROCEDURE:  [place], RIGHT [place]   Surgeon(s): Leighton Ruff, MD   Assist: Richard Miu, PA   ANESTHESIA:   local and general  EBL:  139ml  DISPOSITION OF SPECIMEN:  N/A  COUNTS:  YES  PLAN OF CARE:  Patient already admitted  PATIENT DISPOSITION:  PACU - hemodynamically stable.  INDICATION: Patient with rectal cancer and obstruction in need of diversion and access for IV chemotherapy. Port-A-Cath placement and diverting ostomy was requested.   Use of a central venous catheter for intravenous therapy was discussed. Technique of catheter placement using ultrasound and fluoroscopy guidance was discussed. Risks such as bleeding, infection, pneumothorax, catheter occlusion, reoperation, and other risks were discussed. I noted a good likelihood this will help address the problem. Questions were answered. The patient expressed understanding & wishes to proceed.   Findings: Normal-appearing vascular anatomy.  Perforated cecum.  Rectal mass extending into the L lateral compartment  8 French power port goes through the right internal jugular vein   Procedure: Informed consent was confirmed. Patient was brought the operating room and positioned supine. Arms were tucked. The patient underwent deep sedation. Neck and chest were clipped and prepped and draped in a sterile fashion. A surgical timeout confirmed our plan.   I entered into the right internal jugular vein on the first venipuncture using US  guidance. Non-pulsatile blood was returned. Wire was easily passed into the inferior vena cava and confirmed by fluoroscopy. I confirmed placement of the wire in the right side of the chest.  I made an incision in the lateral infraclavicular area and made a subcutaneous pocket. I used a dilator on the wire using Seldinger technique to dilate the tract under fluoroscopy. I placed the catheter into the sheath. I then peeled away the dilator sheath. I tunneled the power port from the puncture site to the chest pocket. I cut the catheter to appropriate length and attached it to the port using the plastic connector. The port was placed into the pocket and secured to the left anterior chest wall using 2-0 Prolene interrupted stitches x2. Catheter flushed well.  Fluoroscopy confirmed the tip in the distal SVC. Catheter aspirated and flushed well. On final fluoro reevaluation the tip seen to be in good position in the distal SVC.  I closed the wounds using 3-0 Vicryl interrupted sutures for the pocket and 4-0 Monocryl stitch was used to close the skin. Dermabond was used on the 2 incisions. CXR will be performed in PACU. Catheter is okay to use.   The patient was then reprepped and draped for the abdominal portion of the procedure.  A separate timeout was performed indicating correct patient procedure positioning and preoperative antibiotics.  Once this was completed I began with a small upper midline incision for a sideport.  Dissection was carried down through the subcutaneous tissue and the fascia was elevated.  Fascia was then divided with a 15 blade scalpel.  Upon entry into the abdomen, there was a rush of air.  A pursestring suture was placed and the Sheryle Hail was placed inside the abdomen.  The abdomen was  insufflated and a camera was inserted.  Evaluation revealed inflammatory tissue in the right lower quadrant with distended small and large bowel.  I mobilized the left colon using the laparoscopic Enseal device  from the sigmoid colon to the splenic flexure.  I did not mobilize the entire splenic flexure.  At this point I evaluated for the source of the leak.  There was no sign of perforation within the pelvis.  There was some murky fluid which was aspirated and sent for culture.  I evaluated the inflammatory tissue in the right lower quadrant.  Bilious material was identified at the terminal ileum and cecum.  At this point I decided to convert to an open procedure.  The incision was enlarged to a lower midline incision.  An [place] wound protector was placed.  Upon entering the abdomen (organ space), I encountered feculent peritonitis within the RLQ.  The terminal ileum was bluntly dissected from the cecum and a small perforation was noted within the anterior wall of the cecum.  This was secured with a 2-0 silk suture.  I began mobilizing the right colon bluntly.  There was some scarring noted from previous appendectomy.  This was freed into the pelvis allowing for mobilization of the terminal ileum.  I identified the right ureter in its standard location and confirmed that it was away from our dissection plane.  I continued my dissection up into the hepatic flexure dividing the white line of [place].  The hepatic flexure was mobilized bluntly and elevated.  The omentum was separated from the proximal transverse colon using the Enseal device.  I then divided the ileocolic artery using a 2-0 silk suture.  The remaining mesentery was taken to the level of the right branch of the middle colic.  There was a strong palpable pulse in this vessel.  I divided the transverse colon at this point using a GIA 60 mm blue load stapler.  The terminal ileum was also divided the remaining mesentery was cut using the Enseal device.  The specimen was then sent to pathology for further examination.  I decided to perform a primary anastomosis.  The terminal ileum was oriented to the remaining transverse colon and a side-to-side fashion.  An  enterotomy was made in the small and the large bowel.  A GIA stapler was placed and an anastomosis was created.  The common channel was then closed using a 90 mm TA stapler.  The entire staple line was then oversewn using interrupted 3-0 silk sutures.  And tied tension 3-0 silk suture was also placed in the crotch of the anastomosis.  The anastomosis was patent to 2 fingerbreadths.  This was then placed back into the abdomen.  The abdomen was then irrigated with 2 to 3 L of warm normal saline.  Hemostasis was good.  I made an incision in the skin at the level of the previously marked ostomy site in the left upper quadrant.  It was difficult to get the colon to reach to this point.  I decided to perform a modified loop colostomy by dividing the colon at the level of the ostomy with a 60 mm GIA stapler.  I then brought out the proximal end as a colostomy and the corner of the distal end as a venting port.  This was secured into place with 2-0 Vicryl interrupted sutures.  Prior to this the fascia was closed using 2 running #1 PDS sutures.  The skin was left open and a wound VAC was placed.  An ostomy appliance was also placed.  The patient was then awakened from anesthesia.  During this time he was noticed to have a rash and hives involving both upper extremities chest abdomen and mid thigh.  Patient was given Benadryl by anesthesia.  We will monitor this going forward.  He was then awakened and sent to the postanesthesia care unit in stable condition.  All counts were correct per operating room staff.  Rosario Adie, MD  Colorectal and [facility] Surgery

## 2022-09-11 ENCOUNTER — Inpatient Hospital Stay (HOSPITAL_COMMUNITY): Payer: No Typology Code available for payment source

## 2022-09-11 DIAGNOSIS — K56609 Unspecified intestinal obstruction, unspecified as to partial versus complete obstruction: Secondary | ICD-10-CM | POA: Diagnosis not present

## 2022-09-11 DIAGNOSIS — E876 Hypokalemia: Secondary | ICD-10-CM | POA: Diagnosis not present

## 2022-09-11 DIAGNOSIS — R7989 Other specified abnormal findings of blood chemistry: Secondary | ICD-10-CM | POA: Diagnosis not present

## 2022-09-11 DIAGNOSIS — C189 Malignant neoplasm of colon, unspecified: Secondary | ICD-10-CM | POA: Diagnosis not present

## 2022-09-11 LAB — CBC
HCT: 33.6 % — ABNORMAL LOW (ref 39.0–52.0)
Hemoglobin: 10.6 g/dL — ABNORMAL LOW (ref 13.0–17.0)
MCH: 30.4 pg (ref 26.0–34.0)
MCHC: 31.5 g/dL (ref 30.0–36.0)
MCV: 96.3 fL (ref 80.0–100.0)
Platelets: 245 10*3/uL (ref 150–400)
RBC: 3.49 MIL/uL — ABNORMAL LOW (ref 4.22–5.81)
RDW: 14.3 % (ref 11.5–15.5)
WBC: 12 10*3/uL — ABNORMAL HIGH (ref 4.0–10.5)
nRBC: 0 % (ref 0.0–0.2)

## 2022-09-11 LAB — COMPREHENSIVE METABOLIC PANEL
ALT: 28 U/L (ref 0–44)
AST: 19 U/L (ref 15–41)
Albumin: 2.7 g/dL — ABNORMAL LOW (ref 3.5–5.0)
Alkaline Phosphatase: 40 U/L (ref 38–126)
Anion gap: 11 (ref 5–15)
BUN: 29 mg/dL — ABNORMAL HIGH (ref 8–23)
CO2: 21 mmol/L — ABNORMAL LOW (ref 22–32)
Calcium: 7.9 mg/dL — ABNORMAL LOW (ref 8.9–10.3)
Chloride: 105 mmol/L (ref 98–111)
Creatinine, Ser: 1.05 mg/dL (ref 0.61–1.24)
GFR, Estimated: >60 mL/min (ref >60)
Glucose, Bld: 120 mg/dL — ABNORMAL HIGH (ref 70–99)
Potassium: 4.8 mmol/L (ref 3.5–5.1)
Sodium: 137 mmol/L (ref 135–145)
Total Bilirubin: 2.9 mg/dL — ABNORMAL HIGH (ref 0.3–1.2)
Total Protein: 4.8 g/dL — ABNORMAL LOW (ref 6.5–8.1)

## 2022-09-11 NOTE — Evaluation (Signed)
Occupational Therapy Evaluation Patient Details Name: Scott [name] MRN: [id number] DOB: Mar 24, 1951 Today's Date: 09/11/2022   History of Present Illness 72 y.o. 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  In the ER, CT showed suspected distal colonic obstruction secondary to 2.7 cm mid/distal rectal mass.  s/p colectomy/colostomy 09/10/22, cecal perforation noted intraoperatively.   Clinical Impression   Patient evaluated by Occupational Therapy with no further acute OT needs identified. All education has been completed re: home safety and compensatory ADL techniques, and the patient and family have no further questions.  See below for any follow-up Occupational Therapy or equipment needs. OT is signing off. Thank you for this referral.       Recommendations for follow up therapy are one component of a multi-disciplinary discharge planning process, led by the attending physician.  Recommendations may be updated based on patient status, additional functional criteria and insurance authorization.   Follow Up Recommendations  No OT follow up     Assistance Recommended at Discharge PRN  Patient can return home with the following Assistance with cooking/housework    Functional Status Assessment  Patient has had a recent decline in their functional status and demonstrates the ability to make significant improvements in function in a reasonable and predictable amount of time.  Equipment Recommendations  None recommended by OT    Recommendations for Other Services       Precautions / Restrictions Precautions Precautions: Other (comment) Precaution Comments: abdominal; instructed in log roll Restrictions Weight Bearing Restrictions: No      Mobility Bed Mobility Overal bed mobility: Modified Independent             General bed mobility comments: HOB up, used  rail, instructed pt in log roll technique    Transfers       Sit to Stand: Independent                  Balance Overall balance assessment: Modified Independent                                         ADL either performed or assessed with clinical judgement   ADL Overall ADL's : At baseline                                       General ADL Comments: Pt able to demonstrate bed mobility without assistance in form of log roll and reverse log roll, LE dressing using compensatory figure 4 technique, simulated abilityt o perform post-toileting hygiene in both sitting and standing and standing light ADLs Mod I. Pt to have 24/7 care from family.     Vision Baseline Vision/History: 1 Wears glasses Patient Visual Report: No change from baseline       Perception     Praxis      Pertinent Vitals/Pain Pain Assessment Pain Assessment: 0-10 Pain Score: 5  Pain Location: abdominal incision Pain Descriptors / Indicators: Operative site guarding Pain Intervention(s): Limited activity within patient's tolerance, Monitored during session, Repositioned     Hand Dominance Right   Extremity/Trunk Assessment Upper Extremity Assessment Upper Extremity Assessment: Overall WFL for tasks assessed   Lower Extremity Assessment Lower Extremity Assessment: Overall [facility]  for tasks assessed   Cervical / Trunk Assessment Cervical / Trunk Assessment: Normal   Communication Communication Communication: No difficulties   Cognition Arousal/Alertness: Awake/alert Behavior During Therapy: WFL for tasks assessed/performed Overall Cognitive Status: Within Functional Limits for tasks assessed                                       General Comments       Exercises     Shoulder Instructions      Home Living Family/patient expects to be discharged to:: Private residence Living Arrangements: Spouse/significant other Available Help at  Discharge: Family Type of Home: House Home Access: Stairs to enter Technical brewer of Steps: 2 Entrance Stairs-Rails: None Home Layout: One level     Bathroom Shower/Tub: Occupational psychologist: Handicapped height     [place]: Conservation officer, nature (2 wheels);Hand held shower head;Shower seat - built in;BSC/3in1   Additional Comments: Renovating house to make more accessible. Putting into wider dorrs to walk in shower and doors throughout house. Will be placing grab bars in sower and my commode.      Prior Functioning/Environment Prior Level of Function : Independent/Modified Independent;Driving             Mobility Comments: walks without AD, no falls in past 6 months          OT Problem List: Pain      OT Treatment/Interventions:      OT Goals(Current goals can be found in the care plan section) Acute Rehab OT Goals OT Goal Formulation: All assessment and education complete, DC therapy  OT Frequency:      Co-evaluation              AM-PAC OT "6 Clicks" Daily Activity     Outcome Measure Help from another person eating meals?: None (NPO but shows intact abilty to self-feed) Help from another person taking care of personal grooming?: None Help from another person toileting, which includes using toliet, bedpan, or urinal?: None Help from another person bathing (including washing, rinsing, drying)?: None Help from another person to put on and taking off regular upper body clothing?: None Help from another person to put on and taking off regular lower body clothing?: None 6 Click Score: 24   End of Session Equipment Utilized During Treatment: Other (comment) (None)  Activity Tolerance: Patient tolerated treatment well Patient left: in bed;with call bell/phone within reach;with family/visitor present  OT Visit Diagnosis: Pain Pain - part of body:  (ABD)                Time: [id number] OT Time Calculation (min): 16 min Charges:  OT  General Charges $OT Visit: 1 Visit OT Evaluation $OT Eval Low Complexity: 1 Low  Briella Hobday, OT Acute Rehab Services Office: [phone] 09/11/2022  [name] 09/11/2022, 2:43 PM

## 2022-09-11 NOTE — Progress Notes (Signed)
Progress Note   Patient: Scott [name] [id number] DOB: 01-28-1951 DOA: 09/07/2022     4 DOS: the patient was seen and examined on 09/11/2022 at 9:10AM      Brief hospital course: Scott [name] is a 72 y.o. 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  In the ER, CT showed suspected distal colonic obstruction secondary to 2.7 cm mid/distal rectal mass.    3/19: Admitted, Gen Surg consulted, felt he had failed nonoperative management 3/20: GI consulted, unable to stent 3/21: Gen Surg planning for surgery tomorrow 3/22: Underwent Colostomy with Dr. Marcello Moores; intra-operatively also found to have cecal perforation incidentally, and underwent right colectomy with primary anastomosis; developed hives post-op 3/23: NG and foley out, doing well     Assessment and Plan: * Large bowel obstruction ([place]) Admitted and Gen Surg/GI consulted.  Unfortunately, not amenable to stenting, so went to OR for colostomy 3/22 - Post op care per Gen Surg   Colon cancer [facility]) - Follow up with Surgery and Oncology after discharge  Perforation of cecum Incidental and unexpected finding intra-operatively. Found cecal perforation, underwent right colectomy with primary anastomosis (in addition to colostomy) - Continue Zosyn  Hives Noted also incidentally post-op.  Possibly due to cefotetan.  Now resolved.  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  Hgb stable.  - Probably will switch back from  Subq heparin to Eliquis when cleared by Gen Surg - Continue labetalol  Hypokalemia Supplemented and resolved  Essential hypertension BP normal - Hold Entresto and spironolactone - Continue Labetalol          Subjective: Little delirium.  Yesterday, but this resolved overnight.  Pain is well-controlled, general surgery remove the NG and Foley this morning.  He is doing well, no vomiting since removing the NG tube, his mentation is back to normal, no fever, no respiratory distress.     Physical Exam: BP 136/76 (BP Location: Right Arm)   Pulse 84   Temp (!) 97.4 F (36.3 C) (Oral) Comment: ate an ice pop about 20 or 30 min prior  Resp 16   Ht 5\' 6"  (1.676 m)   Wt 76 kg   SpO2 99%   BMI 27.04 kg/m   Elderly adult male, sitting up in recliner, interactive and appropriate RRR, no murmurs, no peripheral edema Respiratory rate normal, lungs clear without rales or wheezing Abdomen soft, some voluntary guarding, but no rigidity or rebound, mild tenderness throughout Attentive to questions, responds appropriately, affect slightly tearful, face symmetric, speech fluent, moves all extremities with generalized weakness but symmetric strength    Data Reviewed: Comprehensive metabolic panel shows mild hyperbilirubinemia, normal LFTs CBC shows white blood cell count up to 12, hemoglobin 10.6 and stable Sodium, potassium, creatinine normal basic metabolic panel  Family Communication: Wife at the bedside    Disposition: Status is: Inpatient Medically stable, Surgery will dictate when he is cleared post-surgially for discharge, likely to home        Author: Edwin Dada, MD 09/11/2022 10:57 AM  For  on call review [email or web address].

## 2022-09-11 NOTE — Evaluation (Addendum)
Physical Therapy Evaluation Patient Details Name: Scott [name] MRN: [id number] DOB: 1951-05-23 Today's Date: 09/11/2022  History of Present Illness  72 y.o. 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  In the ER, CT showed suspected distal colonic obstruction secondary to 2.7 cm mid/distal rectal mass.  s/p colectomy/colostomy 09/10/22, cecal perforation noted intraoperatively.  Clinical Impression  Pt is mobilizing well. Instructed pt in log roll technique for bed mobility to minimize strain to abdominal surgical site. He ambulated 280' holding IV pole, no loss of balance, SpO2 97% on room air (mild wheezing noted, RN notified) , HR 111. Encouraged pt to sit up in recliner and to continue ambulating 4x/day. No further PT indicated as he is mobilizing well. Mobility team to follow.      Recommendations for follow up therapy are one component of a multi-disciplinary discharge planning process, led by the attending physician.  Recommendations may be updated based on patient status, additional functional criteria and insurance authorization.  Follow Up Recommendations No PT follow up      Assistance Recommended at Discharge Set up Supervision/Assistance  Patient can return home with the following  A little help with bathing/dressing/bathroom;Assistance with cooking/housework;Assist for transportation    Equipment Recommendations None recommended by PT  Recommendations for Other Services       Functional Status Assessment Patient has not had a recent decline in their functional status     Precautions / Restrictions Precautions Precautions: Other (comment) Precaution Comments: abdominal; instructed in log roll Restrictions Weight Bearing Restrictions: No      Mobility  Bed Mobility Overal bed mobility: Modified Independent             General bed mobility  comments: HOB up, used rail, instructed pt in log roll technique    Transfers Overall transfer level: Independent Equipment used: None Transfers: Sit to/from Stand Sit to Stand: Independent                Ambulation/Gait Ambulation/Gait assistance: Modified independent (Device/Increase time) Gait Distance (Feet): 280 Feet Assistive device: IV Pole Gait Pattern/deviations: WFL(Within Functional Limits) Gait velocity: WNL     General Gait Details: steady, no loss of balance; some wheezing noted, pt denies h/o asthma, HR 111 walking, SpO2 97% on room air  Stairs            Wheelchair Mobility    Modified Rankin (Stroke Patients Only)       Balance Overall balance assessment: Modified Independent                                           Pertinent Vitals/Pain Pain Assessment Pain Assessment: 0-10 Pain Score: 5  Pain Location: abdominal incision Pain Descriptors / Indicators: Operative site guarding Pain Intervention(s): Limited activity within patient's tolerance, Monitored during session, Premedicated before session, Repositioned    Home Living Family/patient expects to be discharged to:: Private residence Living Arrangements: Spouse/significant other Available Help at Discharge: Family Type of Home: House Home Access: Stairs to enter Entrance Stairs-Rails: None Entrance Stairs-Number of Steps: 2   Home Layout: One level Home Equipment: Conservation officer, nature (2 wheels)      Prior Function Prior Level of Function : Independent/Modified Independent             Mobility Comments: walks  without AD, no falls in past 6 months       Hand Dominance        Extremity/Trunk Assessment   Upper Extremity Assessment Upper Extremity Assessment: Overall WFL for tasks assessed    Lower Extremity Assessment Lower Extremity Assessment: Overall WFL for tasks assessed    Cervical / Trunk Assessment Cervical / Trunk Assessment: Normal   Communication   Communication: No difficulties  Cognition Arousal/Alertness: Awake/alert Behavior During Therapy: WFL for tasks assessed/performed Overall Cognitive Status: Within Functional Limits for tasks assessed                                          General Comments      Exercises     Assessment/Plan    PT Assessment Patient does not need any further PT services  PT Problem List         PT Treatment Interventions      PT Goals (Current goals can be found in the Care Plan section)  Acute Rehab PT Goals Patient Stated Goal: likes to cook PT Goal Formulation: All assessment and education complete, DC therapy    Frequency       Co-evaluation               AM-PAC PT "6 Clicks" Mobility  Outcome Measure Help needed turning from your back to your side while in a flat bed without using bedrails?: None Help needed moving from lying on your back to sitting on the side of a flat bed without using bedrails?: A Little Help needed moving to and from a bed to a chair (including a wheelchair)?: None Help needed standing up from a chair using your arms (e.g., wheelchair or bedside chair)?: None Help needed to walk in hospital room?: None Help needed climbing 3-5 steps with a railing? : None 6 Click Score: 23    End of Session   Activity Tolerance: Patient tolerated treatment well Patient left: in chair;with call bell/phone within reach;with family/visitor present Nurse Communication: Mobility status      Time: [id number] PT Time Calculation (min) (ACUTE ONLY): 18 min   Charges:   PT Evaluation $PT Eval Moderate Complexity: 1 Mod          Philomena Doheny PT 09/11/2022  Acute Rehabilitation Services  Office [phone]

## 2022-09-11 NOTE — Progress Notes (Signed)
1 Day Post-Op   Subjective/Chief Complaint: Sore, has fair amount of stool in bag and some air   Objective: Vital signs in last 24 hours: Temp:  [97.1 F (36.2 C)-98.6 F (37 C)] 98 F (36.7 C) (03/23 0558) Pulse Rate:  [43-114] 99 (03/23 0558) Resp:  [14-18] 18 (03/23 0558) BP: (97-139)/(48-88) 111/88 (03/23 0558) SpO2:  [95 %-100 %] 100 % (03/23 0558) Weight:  [76 kg] 76 kg (03/22 0937) Last BM Date : 09/10/22  Intake/Output from previous day: 03/22 0701 - 03/23 0700 In: 4733.9 [I.V.:3733.9; IV Piggyback:1000] Out: 1075 [Urine:450; Emesis/NG output:75; Stool:350; Blood:200] Intake/Output this shift: No intake/output data recorded.  General nad Pulm effort normal Cv regular Ab vac in place, ostomy with a lot of stool in bag, approp tender , soft  Lab Results:  Recent Labs    09/10/22 0510 09/11/22 0250  WBC 8.6 12.0*  HGB 10.8* 10.6*  HCT 33.9* 33.6*  PLT 240 245   BMET Recent Labs    09/10/22 0510 09/11/22 0250  NA 137 137  K 4.4 4.8  CL 104 105  CO2 22 21*  GLUCOSE 95 120*  BUN 25* 29*  CREATININE 1.11 1.05  CALCIUM 8.4* 7.9*   PT/INR Recent Labs    09/09/22 0445  LABPROT 15.1  INR 1.2   ABG No results for input(s): "PHART", "HCO3" in the last 72 hours.  Invalid input(s): "PCO2", "PO2"  Studies/Results: DG C-Arm 1-60 Min-No Report  Result Date: 09/10/2022 Fluoroscopy was utilized by the requesting physician.  No radiographic interpretation.    Anti-infectives: Anti-infectives (From admission, onward)    Start     Dose/Rate Route Frequency Ordered Stop   09/10/22 1600  piperacillin-tazobactam (ZOSYN) IVPB 3.375 g        3.375 g 12.5 mL/hr over 240 Minutes Intravenous Every 8 hours 09/10/22 1520     09/10/22 0600  cefoTEtan (CEFOTAN) 2 g in sodium chloride 0.9 % 100 mL IVPB        2 g 200 mL/hr over 30 Minutes Intravenous On call to O.R. 09/09/22 0913 09/10/22 1133       Assessment/Plan: POD 1 right colectomy, ostomy, port -will  remove ng tube today and just let have ice chips/sips with meds -I think foley can be removed as well -ambulate, pulm toilet  -sq heparin -follow labs -vac change on Monday -hold eliquis for now    Rolm Bookbinder 09/11/2022

## 2022-09-12 ENCOUNTER — Inpatient Hospital Stay (HOSPITAL_COMMUNITY): Payer: No Typology Code available for payment source

## 2022-09-12 DIAGNOSIS — K56609 Unspecified intestinal obstruction, unspecified as to partial versus complete obstruction: Secondary | ICD-10-CM | POA: Diagnosis not present

## 2022-09-12 DIAGNOSIS — E876 Hypokalemia: Secondary | ICD-10-CM | POA: Diagnosis not present

## 2022-09-12 DIAGNOSIS — R7989 Other specified abnormal findings of blood chemistry: Secondary | ICD-10-CM | POA: Diagnosis not present

## 2022-09-12 DIAGNOSIS — C189 Malignant neoplasm of colon, unspecified: Secondary | ICD-10-CM | POA: Diagnosis not present

## 2022-09-12 LAB — COMPREHENSIVE METABOLIC PANEL
ALT: 26 U/L (ref 0–44)
AST: 23 U/L (ref 15–41)
Albumin: 2.6 g/dL — ABNORMAL LOW (ref 3.5–5.0)
Alkaline Phosphatase: 43 U/L (ref 38–126)
Anion gap: 8 (ref 5–15)
BUN: 28 mg/dL — ABNORMAL HIGH (ref 8–23)
CO2: 23 mmol/L (ref 22–32)
Calcium: 8 mg/dL — ABNORMAL LOW (ref 8.9–10.3)
Chloride: 106 mmol/L (ref 98–111)
Creatinine, Ser: 1.04 mg/dL (ref 0.61–1.24)
GFR, Estimated: >60 mL/min (ref >60)
Glucose, Bld: 112 mg/dL — ABNORMAL HIGH (ref 70–99)
Potassium: 5.1 mmol/L (ref 3.5–5.1)
Sodium: 137 mmol/L (ref 135–145)
Total Bilirubin: 1.3 mg/dL — ABNORMAL HIGH (ref 0.3–1.2)
Total Protein: 5.1 g/dL — ABNORMAL LOW (ref 6.5–8.1)

## 2022-09-12 LAB — CBC
HCT: 30.9 % — ABNORMAL LOW (ref 39.0–52.0)
Hemoglobin: 9.8 g/dL — ABNORMAL LOW (ref 13.0–17.0)
MCH: 30.5 pg (ref 26.0–34.0)
MCHC: 31.7 g/dL (ref 30.0–36.0)
MCV: 96.3 fL (ref 80.0–100.0)
Platelets: 238 10*3/uL (ref 150–400)
RBC: 3.21 MIL/uL — ABNORMAL LOW (ref 4.22–5.81)
RDW: 14.5 % (ref 11.5–15.5)
WBC: 10.6 10*3/uL — ABNORMAL HIGH (ref 4.0–10.5)
nRBC: 0 % (ref 0.0–0.2)

## 2022-09-12 LAB — MRSA NEXT GEN BY PCR, NASAL: MRSA by PCR Next Gen: NOT DETECTED

## 2022-09-12 MED ORDER — SIMETHICONE 80 MG PO CHEW
80.0000 mg | CHEWABLE_TABLET | Freq: Once | ORAL | Status: AC
  Administered 2022-09-12: 80 mg via ORAL
  Filled 2022-09-12: qty 1

## 2022-09-12 MED ORDER — ACETAMINOPHEN 10 MG/ML IV SOLN
1000.0000 mg | Freq: Four times a day (QID) | INTRAVENOUS | Status: AC
  Administered 2022-09-12 (×2): 1000 mg via INTRAVENOUS
  Filled 2022-09-12 (×2): qty 100

## 2022-09-12 MED ORDER — PROCHLORPERAZINE EDISYLATE 10 MG/2ML IJ SOLN
INTRAMUSCULAR | Status: AC
  Filled 2022-09-12: qty 2

## 2022-09-12 MED ORDER — SODIUM CHLORIDE 0.9 % IV SOLN: INTRAVENOUS | Status: DC

## 2022-09-12 MED ORDER — METOPROLOL TARTRATE 25 MG PO TABS
50.0000 mg | ORAL_TABLET | Freq: Two times a day (BID) | ORAL | Status: DC
  Administered 2022-09-12: 50 mg via ORAL
  Filled 2022-09-12: qty 2
  Filled 2022-09-12: qty 1

## 2022-09-12 MED ORDER — METHOCARBAMOL 1000 MG/10ML IJ SOLN
500.0000 mg | Freq: Three times a day (TID) | INTRAVENOUS | Status: DC | PRN
  Administered 2022-09-12: 500 mg via INTRAVENOUS
  Filled 2022-09-12: qty 500

## 2022-09-12 MED ORDER — PROCHLORPERAZINE EDISYLATE 10 MG/2ML IJ SOLN
5.0000 mg | Freq: Once | INTRAMUSCULAR | Status: AC | PRN
  Administered 2022-09-12: 5 mg via INTRAVENOUS
  Filled 2022-09-12: qty 2

## 2022-09-12 MED ORDER — METOPROLOL TARTRATE 5 MG/5ML IV SOLN
5.0000 mg | INTRAVENOUS | Status: DC | PRN
  Administered 2022-09-12: 5 mg via INTRAVENOUS
  Filled 2022-09-12: qty 5

## 2022-09-12 MED ORDER — ORAL CARE MOUTH RINSE: 15.0000 mL | OROMUCOSAL | Status: DC | PRN

## 2022-09-12 MED ORDER — METOPROLOL TARTRATE 5 MG/5ML IV SOLN: 5.0000 mg | Freq: Once | INTRAVENOUS | Status: AC | PRN

## 2022-09-12 MED ORDER — METOPROLOL TARTRATE 5 MG/5ML IV SOLN
5.0000 mg | Freq: Once | INTRAVENOUS | Status: AC
  Administered 2022-09-12: 5 mg via INTRAVENOUS
  Filled 2022-09-12: qty 5

## 2022-09-12 MED ORDER — METOPROLOL TARTRATE 5 MG/5ML IV SOLN
INTRAVENOUS | Status: AC
  Administered 2022-09-12: 5 mg via INTRAVENOUS
  Filled 2022-09-12: qty 5

## 2022-09-12 MED ORDER — METOPROLOL TARTRATE 5 MG/5ML IV SOLN
2.5000 mg | Freq: Once | INTRAVENOUS | Status: DC | PRN
  Filled 2022-09-12: qty 5

## 2022-09-12 NOTE — Progress Notes (Signed)
  Progress Note   Patient: Scott [name] [id number] DOB: 08-27-1950 DOA: 09/07/2022     5 DOS: the patient was seen and examined on 09/12/2022 at 9:01AM      Brief hospital course: Mr. Wojnarowski is a 72 y.o. 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  In the ER, CT showed suspected distal colonic obstruction secondary to 2.7 cm mid/distal rectal mass.    3/19: Admitted, Gen Surg consulted, felt he had failed nonoperative management 3/20: GI consulted, unable to stent 3/21: Gen Surg planning for surgery tomorrow 3/22: Underwent Colostomy with Dr. Marcello Moores; intra-operatively also found to have cecal perforation incidentally, and underwent right colectomy with primary anastomosis; developed hives post-op 3/23: NG and foley out, doing well 3/24: Into afib with RVR     Assessment and Plan: * Large bowel obstruction ([place]) Admitted and Gen Surg/GI consulted.  Unfortunately, not amenable to stenting, so went to OR for colostomy 3/22 - Post op care per Gen Surg   Perforation of cecum Incidental and unexpected finding intra-operatively. Found cecal perforation, underwent right colectomy with primary anastomosis (in addition to colostomy) - Continue Zosyn  Persistent atrial fibrillation ([name]) Afib with RVR Has been off Eliquis since start of month when he self-held due to rectal bleeding from cancer.  Hgb stable.   Overnight, HRs up to the 100s, this morning tachycardic and SOB.  Doubt PE.  ECG done, showed Afib, rates 130. CHA2DS2-Vasc not that high, given risk of bleeding, risk of stroke is overall probably less than bleeding. - Hold Labetalol - IV metoprolol - Start telemetry monitoring - Give IV fluids - Hold Eliquis      Normocytic anemia Hgb stable ~10, no clinical bleeding     Essential hypertension BP normal - Hold Entresto and  spironolactone - Metoprolol          Subjective: Feeling SOB.  No chest pain, no confusion, no dizziness, no pasing out, no distress.  CXR clear, ECG shows Afib.  Ambulating with tech.     Physical Exam: BP 136/74 (BP Location: Left Arm)   Pulse 97   Temp (!) 97.3 F (36.3 C) (Oral)   Resp 20   Ht 5\' 6"  (1.676 m)   Wt 76 kg   SpO2 95%   BMI 27.04 kg/m   Adult male, pale, lying in bed, appears tired but pleasant and no acute distress Tachycardic irregular, no murmurs, no LE edema Respiratory rate seems up, no rales or wheezes Abdomen soft, no signfiincat TTP, no distension Attention normal, affect normal, moves all extremities, oriented, face symmetric.      Data Reviewed: Discussed with Gen Surg ECG reviewed, Afib, rapid rate, no ST changes CXR report reviewed, clear Metabolic panel unremarkable CBC stable   Family Communication: wife    Disposition: Status is: Inpatient To home, still not taking anything by mouth, into afib requiring IV rate control today        Author: Edwin Dada, MD 09/12/2022 9:47 AM  For on call review [email or web address].

## 2022-09-12 NOTE — Progress Notes (Addendum)
   09/12/22 0948  Assess: MEWS Score  Pulse Rate (!) 136  Level of Consciousness Alert  Assess: MEWS Score  MEWS Temp 0  MEWS Systolic 0  MEWS Pulse 3  MEWS RR 0  MEWS LOC 0  MEWS Score 3  MEWS Score Color Yellow  Assess: if the MEWS score is Yellow or Red  Were vital signs taken at a resting state? Yes  Focused Assessment Change from prior assessment (see assessment flowsheet)  Does the patient meet 2 or more of the SIRS criteria? No  Does the patient have a confirmed or suspected source of infection? No  Provider and Rapid Response Notified? Yes  MEWS guidelines implemented  Yes, yellow  Treat  MEWS Interventions Considered administering scheduled or prn medications/treatments as ordered  Take Vital Signs  Increase Vital Sign Frequency  Yellow: Q2hr x1, continue Q4hrs until patient remains green for 12hrs  Escalate  MEWS: Escalate Yellow: Discuss with charge nurse and consider notifying provider and/or RRT  Notify: Charge Nurse/RN  Name of Charge Nurse/RN Notified Alphonzo Lemmings, RN  Provider Notification  Provider Name/Title Dr Loleta Books  Date Provider Notified 09/12/22  Time Provider Notified [phone]  Method of Notification Call  Notification Reason Change in status  Provider response At bedside  Date of Provider Response 09/12/22  Time of Provider Response [phone]  Notify: Rapid Response  Name of Rapid Response RN Notified RR RN  Date Rapid Response Notified 09/12/22  Time Rapid Response Notified [id number]  Assess: SIRS CRITERIA  SIRS Temperature  0  SIRS Pulse 1  SIRS Respirations  0  SIRS WBC 0  SIRS Score Sum  1   Dr Loleta Books and RR-RN,Debra, aware pt Afib with RVR HR 136. Pt calm and collected with no adverse effects noted. Reassurance given to pt and wife @ bedside. Danford ordered medication to administer.

## 2022-09-12 NOTE — Progress Notes (Signed)
After no change in HR after intervention with IV Metoprolol Dr. Loleta Books decided to transfer to higher level of care. Pt moved to ICCU. Transported via bed accompanied by RN and NT. Pt and family reassured.

## 2022-09-12 NOTE — Progress Notes (Signed)
Mobility Specialist - Progress Note   09/12/22 1021  Therapy Vitals  Temp 97.7 F (36.5 C)  Temp Source Oral  Pulse Rate (!) 126  BP (!) 141/113  Patient Position (if appropriate) Lying  Oxygen Therapy  SpO2 98 %  O2 Device Room Air  Patient Activity (if Appropriate) In bed  Mobility  Activity Ambulated independently in hallway  Level of Assistance Independent  Assistive Device None  Distance Ambulated (ft) 65 ft  Activity Response Tolerated fair  Mobility Referral Yes  $Mobility charge 1 Mobility   Pt received in bed and agreeable to mobility. After ambulating 68ft pt became fatigued requiring a seated rest break. HR checked & 160bpm. Suggest pt be wheeled back to room. Vitals recorded. No other complaints during session. Pt to bed after session with all needs met & NT in room.   Pre-mobility: 110 HR During mobility: 160  HR Post-mobility: 126 HR, 141/113 (123) BP, 98 SPO2  Set designer

## 2022-09-12 NOTE — Progress Notes (Addendum)
       Overnight   NAME: Scott [name] MRN: [id number] DOB : [birth year]    Date of Service   09/12/2022   HPI/Events of Note   Notified by RN for concern over abdominal discomfort, HR, and anxiety. No Temp. Patient was unable to take PO Metoprolol    Interventions/ Plan   IV Metoprolol ordered to cover PO  Compazine, Simethicone, ordered to assist with ongoing Nausea Xray ordered. Consulted with Physician on service.    Original CT:  "IMPRESSION: 1. Suspected distal colonic obstruction secondary to 2.7 cm mid/distal rectal mass. Correlate with physical exam. 2. No regional adenopathy or evidence of distal metastatic disease. 3. Small volume pelvic and right upper abdominal ascites. 4.  Aortic Atherosclerosis (ICD10-I70.0).     Electronically Signed   By: Lucrezia Europe M.D.   On: 09/07/2022 17:49"        Update: Imaging    "IMPRESSION: Interval decompression of the large bowel since the prior study with interval increased small bowel dilatation. Findings concerning for mid to distal small bowel obstruction.     Electronically Signed   By: Telford Nab M.D.   On: 09/12/2022 22:06"  2nd update   Some relief of symptoms and patient is able to rest currently  HR appears predominantly in the 100s to 120 +/-    Gershon Cull BSN MSNA MSN [place]

## 2022-09-12 NOTE — Progress Notes (Signed)
2 Days Post-Op   Subjective/Chief Complaint: Some stool in bag no much air, was up and around, short of breath this am thinks its due to congestion   Objective: Vital signs in last 24 hours: Temp:  [97.3 F (36.3 C)-97.8 F (36.6 C)] 97.3 F (36.3 C) (03/24 0451) Pulse Rate:  [51-103] 97 (03/24 0611) Resp:  [16-20] 20 (03/24 0611) BP: (93-137)/(67-88) 136/74 (03/24 0451) SpO2:  [95 %-100 %] 95 % (03/24 0611) Last BM Date : 09/11/22  Intake/Output from previous day: 03/23 0701 - 03/24 0700 In: 2640.1 [P.O.:160; I.V.:2330; IV Piggyback:150.1] Out: 1045 [Urine:740; Emesis/NG output:105; Stool:200] Intake/Output this shift: No intake/output data recorded.  General nad Ab vac in place, stool in stoma bag, approp tender soft   Lab Results:  Recent Labs    09/11/22 0250 09/12/22 0234  WBC 12.0* 10.6*  HGB 10.6* 9.8*  HCT 33.6* 30.9*  PLT 245 238   BMET Recent Labs    09/11/22 0250 09/12/22 0234  NA 137 137  K 4.8 5.1  CL 105 106  CO2 21* 23  GLUCOSE 120* 112*  BUN 29* 28*  CREATININE 1.05 1.04  CALCIUM 7.9* 8.0*   PT/INR No results for input(s): "LABPROT", "INR" in the last 72 hours. ABG No results for input(s): "PHART", "HCO3" in the last 72 hours.  Invalid input(s): "PCO2", "PO2"  Studies/Results: DG CHEST PORT 1 VIEW  Result Date: 09/11/2022 CLINICAL DATA:  Port-A-Cath placement EXAM: PORTABLE CHEST 1 VIEW COMPARISON:  04/11/2022 FINDINGS: Cardiac silhouette is grossly enlarged. Lungs are clear. Normal pulmonary vasculature. No pneumothorax. No pleural effusion. Descending thoracic aortic stent. Right-sided Port-A-Cath tip superimposed with the proximal SVC. IMPRESSION: Enlarged cardiac silhouette. Electronically Signed   By: Sammie Bench M.D.   On: 09/11/2022 10:10   DG C-Arm 1-60 Min-No Report  Result Date: 09/10/2022 Fluoroscopy was utilized by the requesting physician.  No radiographic interpretation.    Anti-infectives: Anti-infectives (From  admission, onward)    Start     Dose/Rate Route Frequency Ordered Stop   09/10/22 1600  piperacillin-tazobactam (ZOSYN) IVPB 3.375 g        3.375 g 12.5 mL/hr over 240 Minutes Intravenous Every 8 hours 09/10/22 1520     09/10/22 0600  cefoTEtan (CEFOTAN) 2 g in sodium chloride 0.9 % 100 mL IVPB        2 g 200 mL/hr over 30 Minutes Intravenous On call to O.R. 09/09/22 0913 09/10/22 1133       Assessment/Plan: POD 2 right colectomy, ostomy, port -continue npo except for ice chips and sips with meds -  he can do oral medications -voiding fine without foley in place -ambulate, pulm toilet  -cxr yesterday showed port in good position, due to sob this am and low grade tachycardia recently will check another chest xray to start -sq heparin -follow labs- cr normal, cbc a little less will follow -vac change on Monday -hold eliquis for now- if need to restart anticoagulation would do iv heparin due to half life  Rolm Bookbinder 09/12/2022

## 2022-09-12 NOTE — Progress Notes (Signed)
Dr Loleta Books made aware that pt remains tachy sustained in 110's with spikes up to 140's. See new orders enter per MD.

## 2022-09-12 NOTE — Progress Notes (Signed)
Report called to ICU and given to [facility].

## 2022-09-13 ENCOUNTER — Inpatient Hospital Stay (HOSPITAL_COMMUNITY): Payer: No Typology Code available for payment source

## 2022-09-13 ENCOUNTER — Encounter (HOSPITAL_COMMUNITY): Payer: Self-pay | Admitting: General Surgery

## 2022-09-13 DIAGNOSIS — E876 Hypokalemia: Secondary | ICD-10-CM | POA: Diagnosis not present

## 2022-09-13 DIAGNOSIS — K567 Ileus, unspecified: Secondary | ICD-10-CM | POA: Insufficient documentation

## 2022-09-13 DIAGNOSIS — R7989 Other specified abnormal findings of blood chemistry: Secondary | ICD-10-CM | POA: Diagnosis not present

## 2022-09-13 DIAGNOSIS — C189 Malignant neoplasm of colon, unspecified: Secondary | ICD-10-CM | POA: Diagnosis not present

## 2022-09-13 DIAGNOSIS — K56609 Unspecified intestinal obstruction, unspecified as to partial versus complete obstruction: Secondary | ICD-10-CM | POA: Diagnosis not present

## 2022-09-13 DIAGNOSIS — F05 Delirium due to known physiological condition: Secondary | ICD-10-CM

## 2022-09-13 LAB — BASIC METABOLIC PANEL
Anion gap: 11 (ref 5–15)
BUN: 28 mg/dL — ABNORMAL HIGH (ref 8–23)
CO2: 22 mmol/L (ref 22–32)
Calcium: 8.2 mg/dL — ABNORMAL LOW (ref 8.9–10.3)
Chloride: 107 mmol/L (ref 98–111)
Creatinine, Ser: 1.06 mg/dL (ref 0.61–1.24)
GFR, Estimated: >60 mL/min (ref >60)
Glucose, Bld: 106 mg/dL — ABNORMAL HIGH (ref 70–99)
Potassium: 4.7 mmol/L (ref 3.5–5.1)
Sodium: 140 mmol/L (ref 135–145)

## 2022-09-13 LAB — CBC
HCT: 31.6 % — ABNORMAL LOW (ref 39.0–52.0)
Hemoglobin: 10.1 g/dL — ABNORMAL LOW (ref 13.0–17.0)
MCH: 30.4 pg (ref 26.0–34.0)
MCHC: 32 g/dL (ref 30.0–36.0)
MCV: 95.2 fL (ref 80.0–100.0)
Platelets: 282 10*3/uL (ref 150–400)
RBC: 3.32 MIL/uL — ABNORMAL LOW (ref 4.22–5.81)
RDW: 14.8 % (ref 11.5–15.5)
WBC: 11.1 10*3/uL — ABNORMAL HIGH (ref 4.0–10.5)
nRBC: 0 % (ref 0.0–0.2)

## 2022-09-13 LAB — LACTIC ACID, PLASMA: Lactic Acid, Venous: 1.1 mmol/L (ref 0.5–1.9)

## 2022-09-13 LAB — SURGICAL PATHOLOGY

## 2022-09-13 LAB — MAGNESIUM: Magnesium: 2.3 mg/dL (ref 1.7–2.4)

## 2022-09-13 MED ORDER — PROCHLORPERAZINE EDISYLATE 10 MG/2ML IJ SOLN
5.0000 mg | Freq: Once | INTRAMUSCULAR | Status: AC | PRN
  Administered 2022-09-13: 5 mg via INTRAVENOUS
  Filled 2022-09-13: qty 2

## 2022-09-13 MED ORDER — ACETAMINOPHEN 10 MG/ML IV SOLN
1000.0000 mg | Freq: Four times a day (QID) | INTRAVENOUS | Status: AC
  Administered 2022-09-13 – 2022-09-14 (×4): 1000 mg via INTRAVENOUS
  Filled 2022-09-13 (×4): qty 100

## 2022-09-13 MED ORDER — DILTIAZEM HCL-DEXTROSE 125-5 MG/125ML-% IV SOLN (PREMIX)
5.0000 mg/h | INTRAVENOUS | Status: AC
  Administered 2022-09-13: 5 mg/h via INTRAVENOUS
  Filled 2022-09-13: qty 125

## 2022-09-13 MED ORDER — METOPROLOL TARTRATE 5 MG/5ML IV SOLN
5.0000 mg | Freq: Four times a day (QID) | INTRAVENOUS | Status: DC
  Administered 2022-09-13 – 2022-09-17 (×17): 5 mg via INTRAVENOUS
  Filled 2022-09-13 (×17): qty 5

## 2022-09-13 MED ORDER — LORAZEPAM 2 MG/ML IJ SOLN
INTRAMUSCULAR | Status: AC
  Administered 2022-09-13: 0.5 mg via INTRAVENOUS
  Filled 2022-09-13: qty 1

## 2022-09-13 MED ORDER — LORAZEPAM 2 MG/ML IJ SOLN
1.0000 mg | Freq: Once | INTRAMUSCULAR | Status: AC
  Administered 2022-09-13: 1 mg via INTRAVENOUS
  Filled 2022-09-13: qty 1

## 2022-09-13 MED ORDER — LORAZEPAM 2 MG/ML IJ SOLN: 0.5000 mg | Freq: Once | INTRAMUSCULAR | Status: AC | PRN

## 2022-09-13 NOTE — Progress Notes (Signed)
Pt had 18 beat run of Vtach. Dr. Loleta Books notified.

## 2022-09-13 NOTE — Progress Notes (Signed)
3 Days Post-Op   Subjective/Chief Complaint: Afib yesterday transferred to icu, bloated, nausea, delirious   Objective: Vital signs in last 24 hours: Temp:  [97.2 F (36.2 C)-98.2 F (36.8 C)] 98.2 F (36.8 C) (03/25 0741) Pulse Rate:  [60-157] 121 (03/25 0500) Resp:  [14-22] 20 (03/25 0500) BP: (126-178)/(64-113) 158/78 (03/25 0500) SpO2:  [96 %-100 %] 97 % (03/25 0500) Last BM Date : 09/12/22  Intake/Output from previous day: 03/24 0701 - 03/25 0700 In: 2214.8 [I.V.:1813.6; IV Piggyback:401.3] Out: 600 [Urine:600] Intake/Output this shift: Total I/O In: -  Out: 50 [Stool:50]  General not oriented Ab moderate distention, stool in bag, nontender  Lab Results:  Recent Labs    09/12/22 0234 09/13/22 0234  WBC 10.6* 11.1*  HGB 9.8* 10.1*  HCT 30.9* 31.6*  PLT 238 282   BMET Recent Labs    09/12/22 0234 09/13/22 0234  NA 137 140  K 5.1 4.7  CL 106 107  CO2 23 22  GLUCOSE 112* 106*  BUN 28* 28*  CREATININE 1.04 1.06  CALCIUM 8.0* 8.2*   PT/INR No results for input(s): "LABPROT", "INR" in the last 72 hours. ABG No results for input(s): "PHART", "HCO3" in the last 72 hours.  Invalid input(s): "PCO2", "PO2"  Studies/Results: DG Abd Portable 1V  Result Date: 09/12/2022 CLINICAL DATA:  [id number].  Abdominal distention. EXAM: PORTABLE ABDOMEN - 1 VIEW COMPARISON:  CT with IV contrast 09/07/2022, abdomen film 08/21/2022 FINDINGS: Interval decompression of the large bowel since the prior study with interval increased small bowel dilatation. There is small bowel dilatation in the upper to mid abdomen up to 4.5 cm, previously 3 cm. There are decompressed small bowel segments in the right lower quadrant and upper pelvis. Findings concerning for mid to distal small bowel obstruction. There is no supine evidence of free air. No pathologic calcification or other significant finding is seen. Degenerative change lumbar spine. IMPRESSION: Interval decompression of the large  bowel since the prior study with interval increased small bowel dilatation. Findings concerning for mid to distal small bowel obstruction. Electronically Signed   By: Telford Nab M.D.   On: 09/12/2022 22:06   DG CHEST PORT 1 VIEW  Result Date: 09/12/2022 CLINICAL DATA:  Shortness of breath. EXAM: PORTABLE CHEST 1 VIEW COMPARISON:  09/11/2022 FINDINGS: Single-view of the chest again demonstrates a prominent cardiac silhouette which may be accentuated by slightly low lung volumes and the AP portable technique. Patient is also rotated towards the left on this examination. No focal lung densities are identified. Patient has an aortic stent graft involving the descending thoracic aorta. Stable position of the right jugular Port-A-Cath with the tip near the upper SVC region. Negative for pneumothorax. IMPRESSION: 1. Low lung volumes without focal disease. 2. Cardiac silhouette is prominent but probably accentuated by the low lung volumes and technique. Electronically Signed   By: Markus Daft M.D.   On: 09/12/2022 09:36   DG CHEST PORT 1 VIEW  Result Date: 09/11/2022 CLINICAL DATA:  Port-A-Cath placement EXAM: PORTABLE CHEST 1 VIEW COMPARISON:  04/11/2022 FINDINGS: Cardiac silhouette is grossly enlarged. Lungs are clear. Normal pulmonary vasculature. No pneumothorax. No pleural effusion. Descending thoracic aortic stent. Right-sided Port-A-Cath tip superimposed with the proximal SVC. IMPRESSION: Enlarged cardiac silhouette. Electronically Signed   By: Sammie Bench M.D.   On: 09/11/2022 10:10    Anti-infectives: Anti-infectives (From admission, onward)    Start     Dose/Rate Route Frequency Ordered Stop   09/10/22 1600  piperacillin-tazobactam (ZOSYN) IVPB  3.375 g        3.375 g 12.5 mL/hr over 240 Minutes Intravenous Every 8 hours 09/10/22 1520     09/10/22 0600  cefoTEtan (CEFOTAN) 2 g in sodium chloride 0.9 % 100 mL IVPB        2 g 200 mL/hr over 30 Minutes Intravenous On call to O.R. 09/09/22  0913 09/10/22 1133       Assessment/Plan: POD 3 right colectomy, ostomy, port -continue npo and I think he does have an ileus although didn't look like that over weekend- discussed ng tube and npo  -voiding fine without foley in place -appreciate medical care of afib -sq heparin -follow labs- cr normal,  wbc mildly elevated but nothing concerning and afebriile -vac change today -hold eliquis for now- if need to restart anticoagulation would do iv heparin due to half life   Rolm Bookbinder 09/13/2022

## 2022-09-13 NOTE — Consult Note (Addendum)
WOC Nurse Wound Consult Note: Surgical PA at the bedside for first post-op pouch change and wound assessment.  Pt was medicated for pain prior to the procedure and tolerated with minimal amt discomfort.  Midline abd with full thickness post-op wound is beefy red and moist; 9X5X1cm.  Applied one piece black foam to 159mm cont suction.  WOC will change the dressing again on Wed.   [place] Nurse ostomy consult note Stoma type/location: Pt had colosotmy surgery performed 3/22.  He is critically ill in ICU and agitated and confused and did not take place in the teaching session.  Family member at the bedside watched the pouch change and asked appropriate questions. Stoma is in close proximity to the abd wound and pouch will need to be changed during each Vac change.  Current flat pouch is leaking but did not soil the wound.  Stomal assessment/size: Stoma is red and viable, 1 1/2 inches, slightly above skin level, located in a crease at 3:00 o'clock and 9:00 o'clock. Peristomal assessment: intact skin surrounding Output: 50cc liquid brown stool Ostomy pouching: Applied barrier ring and one piece flexible convex pouch to attempt to maintain a seal. Discussed pouching routines. 2 sets of supplies left at the bedside; use barrier rings, Kellie Simmering # [phone] and one piece flexible pouches Kellie Simmering # [phone] Enrolled patient in [employer] program: Not yet WOC will perform another pouch change and Vac change on Wed.  Thank-you,  [name] MSN, [place], [place], [place], [place]

## 2022-09-13 NOTE — Progress Notes (Addendum)
Patient complaining of nausea and generalized malaise. Afebrile. Gave PRN Zofran IV. Patient will be unable to take PO metoprolol. IV dose requested from NP. Patient noted to be very restless, complaining of discomfort, but not pain. Patient is very distended. Gershon Cull, NP contacted for further treatment option.      2100: Patient still complaining of nausea and discomfort. NP contacted for anti-gas and further antiemetic. Also requested portable XR to assess for SBO/LBO.    0015: Patient woke up and suddenly vomited. Small amount, bilious. NP consulted for further nausea control.    0045: Patient with possible ICU delirium. Per family, patient hasn't slept over the last few days. Patient noted to be agitated, irritable, and paranoid, stating that we were kidnapping him.    0200: Patient awoke, agitated again. HR increased to 170s d/t agitation. NP contacted.    0230: in-house MD and NP at bedside. Will give ativan 1mg  and start cardizem gtt for rate control   0507: Patient awoke extremely aggressive. Noted to be very suspicious and paranoid. Striking out at his family. NP aware. Ativan ordered.    [id number]: went in to clean up patient. Patient combative, not redirectable. Purewick placed.

## 2022-09-13 NOTE — Assessment & Plan Note (Signed)
ICU delirium Improved today - Standard delirium precautions   - Avoid benzos

## 2022-09-13 NOTE — Anesthesia Postprocedure Evaluation (Signed)
Anesthesia Post Note  Patient: Scott [name]  Procedure(s) Performed: LAPAROSCOPIC ASSISTED LOOP COLOSTOMY PLACEMENT, RIGHT COLECTOMY INSERTION PORT-A-CATH     Patient location during evaluation: PACU Anesthesia Type: General Level of consciousness: awake and alert Pain management: pain level controlled Vital Signs Assessment: post-procedure vital signs reviewed and stable Respiratory status: spontaneous breathing, nonlabored ventilation, respiratory function stable and patient connected to nasal cannula oxygen Cardiovascular status: blood pressure returned to baseline and stable Postop Assessment: no apparent nausea or vomiting Anesthetic complications: no  No notable events documented.  Last Vitals:  Vitals:   09/13/22 0500 09/13/22 0741  BP: (!) 158/78   Pulse: (!) 121   Resp: 20   Temp:  36.8 C  SpO2: 97%     Last Pain:  Vitals:   09/13/22 0741  TempSrc: Axillary  PainSc:                  [name]

## 2022-09-13 NOTE — Assessment & Plan Note (Signed)
Some stool in ostomy today, but still distended, still significant output from NG - TPN per Gen Surg - Defer NG to Gen Surg - IV fluids and electrolytes per TPN RPh

## 2022-09-13 NOTE — Progress Notes (Signed)
  Progress Note   Patient: Scott [name] [id number] DOB: Jun 12, 1951 DOA: 09/07/2022     6 DOS: the patient was seen and examined on 09/13/2022 at 7:59AM      Brief hospital course: Scott [name] is a 72 y.o. 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  In the ER, CT showed suspected distal colonic obstruction secondary to 2.7 cm mid/distal rectal mass.    3/19: Admitted, Gen Surg consulted, felt he had failed nonoperative management 3/20: GI consulted, unable to stent 3/21: Gen Surg planning for surgery tomorrow 3/22: Underwent Colostomy with Dr. Marcello Moores; intra-operatively also found to have cecal perforation incidentally, and underwent right colectomy with primary anastomosis; developed hives post-op 3/23: NG and foley out, doing well 3/24: Into afib with RVR, transferred to stepdown 3/25: Delirious, AXR shows ileus        Assessment and Plan: * Large bowel obstruction ([place]) - Appreciate Gen Surg expertise   Perforation of cecum - Continue Zosyn  Ileus ([place]) Abd more distended, x-ray shows ?dilated SB.   - Defer NG to Gen Surg - Continue IV fluids  Persistent atrial fibrillation ([name]) Afib with RVR Has been off Eliquis since start of month when he self-held due to rectal bleeding from cancer.  Hgb stable.   Unable to give PO meds overnight, Dilt drip started.  Rates not controlled yet - Start IV metoprolol - Continue dilt drip  - Hold Eliquis for now, resume when cleared by Gen Surg, at present do not feel compelling need for anticoagulation, will hold heparin    Delirium due to multiple etiologies, acute, hyperactive ICU delirium - Standard delirium precautions   - Avoid benzos - Haldol PRN if absolutely necessary     Essential hypertension BP elevated due to agitation - Hold Entresto and spironolactone - Metoprolol and  diltiazem          Subjective: Delirious overnight, still tachycardic.  No concerns of dyspnea, chest pain.  Had emesis, nausea, bloating overnight.  Dill drip started overnight.     Physical Exam: BP (!) 163/100 (BP Location: Right Arm)   Pulse (!) 102   Temp 98.2 F (36.8 C) (Axillary)   Resp (!) 22   Ht 5\' 6"  (1.676 m)   Wt 76 kg   SpO2 99%   BMI 27.04 kg/m   Adult male, elderly, appears confused and disoriented, rambling incoherently Tachycardic, irregular, no murmurs, no peripheral edema, no respiratory distress, lung sounds clear without rales or wheezes Abdomen distended, no tenderness to palpation, no grimace, no rigidity Inattentive, rambling and does not make eye contact consistently, gives one-word answers, sometimes, upper extremity strength seems symmetric, speech is fluent but incoherent    Data Reviewed: Patient metabolic panel shows stable renal function CBC shows stable anemia Abdominal x-ray shows dilated small bowel    Family Communication: Wife and daughters at the bedside    Disposition: Status is: Inpatient         Author: Edwin Dada, MD 09/13/2022 10:18 AM  For on call review [email or web address].

## 2022-09-14 DIAGNOSIS — R7989 Other specified abnormal findings of blood chemistry: Secondary | ICD-10-CM | POA: Diagnosis not present

## 2022-09-14 DIAGNOSIS — E876 Hypokalemia: Secondary | ICD-10-CM | POA: Diagnosis not present

## 2022-09-14 DIAGNOSIS — C189 Malignant neoplasm of colon, unspecified: Secondary | ICD-10-CM | POA: Diagnosis not present

## 2022-09-14 DIAGNOSIS — K56609 Unspecified intestinal obstruction, unspecified as to partial versus complete obstruction: Secondary | ICD-10-CM | POA: Diagnosis not present

## 2022-09-14 LAB — COMPREHENSIVE METABOLIC PANEL
ALT: 22 U/L (ref 0–44)
AST: 18 U/L (ref 15–41)
Albumin: 2.3 g/dL — ABNORMAL LOW (ref 3.5–5.0)
Alkaline Phosphatase: 49 U/L (ref 38–126)
Anion gap: 8 (ref 5–15)
BUN: 23 mg/dL (ref 8–23)
CO2: 24 mmol/L (ref 22–32)
Calcium: 8.2 mg/dL — ABNORMAL LOW (ref 8.9–10.3)
Chloride: 107 mmol/L (ref 98–111)
Creatinine, Ser: 0.87 mg/dL (ref 0.61–1.24)
GFR, Estimated: >60 mL/min (ref >60)
Glucose, Bld: 96 mg/dL (ref 70–99)
Potassium: 4.1 mmol/L (ref 3.5–5.1)
Sodium: 139 mmol/L (ref 135–145)
Total Bilirubin: 1.2 mg/dL (ref 0.3–1.2)
Total Protein: 4.9 g/dL — ABNORMAL LOW (ref 6.5–8.1)

## 2022-09-14 LAB — CBC
HCT: 29.5 % — ABNORMAL LOW (ref 39.0–52.0)
Hemoglobin: 9.4 g/dL — ABNORMAL LOW (ref 13.0–17.0)
MCH: 30.3 pg (ref 26.0–34.0)
MCHC: 31.9 g/dL (ref 30.0–36.0)
MCV: 95.2 fL (ref 80.0–100.0)
Platelets: 286 10*3/uL (ref 150–400)
RBC: 3.1 MIL/uL — ABNORMAL LOW (ref 4.22–5.81)
RDW: 15.1 % (ref 11.5–15.5)
WBC: 7.4 10*3/uL (ref 4.0–10.5)
nRBC: 0 % (ref 0.0–0.2)

## 2022-09-14 LAB — GLUCOSE, CAPILLARY: Glucose-Capillary: 116 mg/dL — ABNORMAL HIGH (ref 70–99)

## 2022-09-14 LAB — MAGNESIUM: Magnesium: 2.3 mg/dL (ref 1.7–2.4)

## 2022-09-14 MED ORDER — INSULIN ASPART 100 UNIT/ML IJ SOLN
0.0000 [IU] | Freq: Three times a day (TID) | INTRAMUSCULAR | Status: DC
  Administered 2022-09-15 – 2022-09-16 (×6): 1 [IU] via SUBCUTANEOUS

## 2022-09-14 MED ORDER — DILTIAZEM HCL-DEXTROSE 125-5 MG/125ML-% IV SOLN (PREMIX)
5.0000 mg/h | INTRAVENOUS | Status: DC
  Administered 2022-09-14: 5 mg/h via INTRAVENOUS
  Administered 2022-09-15: 9.5 mg/h via INTRAVENOUS
  Administered 2022-09-16 (×2): 7.5 mg/h via INTRAVENOUS
  Filled 2022-09-14 (×5): qty 125

## 2022-09-14 MED ORDER — TRAVASOL 10 % IV SOLN
INTRAVENOUS | Status: AC
  Filled 2022-09-14: qty 324

## 2022-09-14 NOTE — Progress Notes (Addendum)
4 Days Post-Op   Subjective/Chief Complaint: Somnolent this am, better, 1600 out of ng tube   Objective: Vital signs in last 24 hours: Temp:  [97.5 F (36.4 C)-98.4 F (36.9 C)] 98 F (36.7 C) (03/26 0740) Pulse Rate:  [33-177] 134 (03/26 0900) Resp:  [13-24] 20 (03/26 0900) BP: (123-180)/(55-114) 169/102 (03/26 0900) SpO2:  [90 %-100 %] 100 % (03/26 0900) Last BM Date : 09/13/22  Intake/Output from previous day: 03/25 0701 - 03/26 0700 In: 505 [I.V.:267.3; IV Piggyback:237.7] Out: 2500 [Urine:750; Emesis/NG output:1650; Stool:100] Intake/Output this shift: Total I/O In: 278.1 [IV Piggyback:278.1] Out: -   Ab vac in place, soft ostomy with stool in place  Lab Results:  Recent Labs    09/13/22 0234 09/14/22 0317  WBC 11.1* 7.4  HGB 10.1* 9.4*  HCT 31.6* 29.5*  PLT 282 286   BMET Recent Labs    09/13/22 0234 09/14/22 0317  NA 140 139  K 4.7 4.1  CL 107 107  CO2 22 24  GLUCOSE 106* 96  BUN 28* 23  CREATININE 1.06 0.87  CALCIUM 8.2* 8.2*   PT/INR No results for input(s): "LABPROT", "INR" in the last 72 hours. ABG No results for input(s): "PHART", "HCO3" in the last 72 hours.  Invalid input(s): "PCO2", "PO2"  Studies/Results: DG Abd 1 View  Result Date: 09/13/2022 CLINICAL DATA:  Check gastric catheter placement EXAM: ABDOMEN - 1 VIEW COMPARISON:  None Available. FINDINGS: Gastric catheter is noted within the stomach. Descending aortic stent graft is seen. Scattered large and small bowel gas is noted. Mild persistent small bowel dilatation is seen. No free air is noted. IMPRESSION: Gastric catheter within the stomach. Electronically Signed   By: Inez Catalina M.D.   On: 09/13/2022 19:22   DG Abd 1 View  Result Date: 09/13/2022 CLINICAL DATA:  NG tube placement EXAM: ABDOMEN - 1 VIEW COMPARISON:  Previous studies including the examination of 09/12/2022 FINDINGS: Tip of NG tube is seen in the region of fundus of the stomach. Side port in the NG tube is not  distinctly visualized. There is presence of gas in slightly dilated small bowel loops. There is previous endovascular stent repair in thoracic aorta. IMPRESSION: Tip of NG tube is seen in the fundus of the stomach. Electronically Signed   By: Elmer Picker M.D.   On: 09/13/2022 12:41   DG Abd Portable 1V  Result Date: 09/12/2022 CLINICAL DATA:  [id number].  Abdominal distention. EXAM: PORTABLE ABDOMEN - 1 VIEW COMPARISON:  CT with IV contrast 09/07/2022, abdomen film 08/21/2022 FINDINGS: Interval decompression of the large bowel since the prior study with interval increased small bowel dilatation. There is small bowel dilatation in the upper to mid abdomen up to 4.5 cm, previously 3 cm. There are decompressed small bowel segments in the right lower quadrant and upper pelvis. Findings concerning for mid to distal small bowel obstruction. There is no supine evidence of free air. No pathologic calcification or other significant finding is seen. Degenerative change lumbar spine. IMPRESSION: Interval decompression of the large bowel since the prior study with interval increased small bowel dilatation. Findings concerning for mid to distal small bowel obstruction. Electronically Signed   By: Telford Nab M.D.   On: 09/12/2022 22:06    Anti-infectives: Anti-infectives (From admission, onward)    Start     Dose/Rate Route Frequency Ordered Stop   09/10/22 1600  piperacillin-tazobactam (ZOSYN) IVPB 3.375 g        3.375 g 12.5 mL/hr over 240 Minutes  Intravenous Every 8 hours 09/10/22 1520     09/10/22 0600  cefoTEtan (CEFOTAN) 2 g in sodium chloride 0.9 % 100 mL IVPB        2 g 200 mL/hr over 30 Minutes Intravenous On call to O.R. 09/09/22 0913 09/10/22 1133       Assessment/Plan: POD 4 right colectomy, ostomy, port -continue npo and I think he does have an ileus-will continue ng drainage for today, start tpn -voiding fine without foley in place -appreciate medical care of afib -sq heparin -follow  labs- cr normal,  wbc normal today -vac change tomorrow -hold eliquis for now- IV heparin is fine -stop abx at POD 5  Rolm Bookbinder 09/14/2022

## 2022-09-14 NOTE — Progress Notes (Signed)
PHARMACY - TOTAL PARENTERAL NUTRITION CONSULT NOTE   Indication: Prolonged ileus  Patient Measurements: Height: 5\' 6"  (167.6 cm) Weight: 76 kg (167 lb 8 oz) IBW/kg (Calculated) : 63.8 TPN AdjBW (KG): 76 Body mass index is 27.04 kg/m.  Assessment: 72 year old male with rectal adenocarcinoma s/p colostomy. Intra-operatively, incidental finding of cecal perforation and underwent right colectomy with primary anastomosis. Patient with ileus, NGT placed 3/25. Pharmacy consulted to manage TPN.  Glucose / Insulin: no history of DM -CBGs < 150 Electrolytes: WNL, corrected Ca 9.5 Renal: stable Hepatic: LFTs WNL Intake / Output; MIVF:  -UOP 750 ml documented -NG output 800 ml yesterday -No MIVF GI Imaging: -3/24 AXR concerning for mid to distal small bowel obstruction GI Surgeries / Procedures:  -3/22 loop colostomy placement, right colectomy  Central access: port placed 3/22 TPN start date: 3/26  Nutritional Goals: Goal TPN rate is 100 mL/hr (provides 108 g of protein and 2246 kcals per day)  RD Assessment: Estimated Needs Total Energy Estimated Needs: 2100-2300 kcals Total Protein Estimated Needs: 100-115 grams Total Fluid Estimated Needs: >/= 2.1L  Current Nutrition: NPO  Plan:  Start TPN at 30 mL/hr at 1800 -Discussed with RD today - pt at high refeeding risk given prolonged course of little to no nutrition (both PTA and while here) -Plan to start ~ 1/3 of goal and advance slowly as patient tolerates  Electrolytes in TPN: Na 11mEq/L, K 5mEq/L, Ca 65mEq/L, Mg 63mEq/L, and Phos 58mmol/L. Cl:Ac 1:1  Add standard MVI and trace elements to TPN, plan 5 days of thiamine in TPN (Day 1 of 5)  Initiate Sensitive q8h SSI and adjust as needed   Monitor TPN labs on Mon/Thurs, daily for at least 3 days given refeeding risk   Tawnya Crook, PharmD, BCPS Clinical Pharmacist 09/14/2022 9:15 AM

## 2022-09-14 NOTE — Progress Notes (Signed)
Progress Note   Patient: Scott [name] [id number] DOB: May 28, 1951 DOA: 09/07/2022     7 DOS: the patient was seen and examined on 09/14/2022 at 8:15AM      Brief hospital course: Scott [name] is a 72 y.o. 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  In the ER, CT showed suspected distal colonic obstruction secondary to 2.7 cm mid/distal rectal mass.    3/19: Admitted, Gen Surg consulted, felt he had failed nonoperative management 3/20: GI consulted, unable to stent 3/21: Gen Surg planning for surgery tomorrow 3/22: Underwent Colostomy with Dr. Marcello Moores; intra-operatively also found to have cecal perforation incidentally, and underwent right colectomy with primary anastomosis; developed hives post-op 3/23: NG and foley out, doing well 3/24: Into afib with RVR, transferred to SDU 3/25: Delirious, AXR shows ileus 3/26: Delirium resolved, TPN started     Assessment and Plan: * Large bowel obstruction (Scott [name]) Admitted and Gen Surg/GI consulted.  Unfortunately, not amenable to stenting, so went to OR for colostomy 3/22 - Post op care per Gen Surg   Perforation of cecum Incidental and unexpected finding intra-operatively. Found cecal perforation, underwent right colectomy with primary anastomosis (in addition to colostomy) - Continue Zosyn day 4 of 5 post-op  Ileus (Scott [name]) Some stool in ostomy today, but still distended, still significant output from NG - TPN per Gen Surg - Defer NG to Gen Surg - IV fluids and electrolytes per TPN RPh  Persistent atrial fibrillation (Scott [name]) Afib with RVR Baseline is pers AF rates ~100.  Has been off Eliquis since start of month when he self-held due to rectal bleeding from cancer.     Still with NG - Continue scheduled IV metoprolol - COntinue dilt drip - Transition to PO when able to remove NG - Hold home  labetalol - Hold Eliquis for now, resume when cleared by Gen Surg, at present do not feel compelling need for anticoagulation, will hold heparin    Delirium due to multiple etiologies, acute, hyperactive ICU delirium Improved today - Standard delirium precautions   - Avoid benzos  Hives Brief, noted incidentally post-op.  Possibly due to cefotetan unclear.  Now resolved.  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  Some stool in ostomy, some flatus.  Belly less distended.  No fever, no change in moderate abdominal pain.  No dyspnea, tachypnea.  TPN started by Gen Surg, NG continued.     Physical Exam: BP (!) 169/102   Pulse (!) 134   Temp 98 F (36.7 C) (Axillary)   Resp 20   Ht 5\' 6"  (1.676 m)   Wt 76 kg   SpO2 100%   BMI 27.04 kg/m   Elderly adult male, appears tired but interactive Tachycardic, irregular, no murmurs, no LE edema RR seems normal, lungs clear, just diminished Abdomen with diffuse but expected tenderneess, some distension, some stool in ostomy, no rigidity  Attention normal, affect blunted but ioriented x4, moves all extremities with generalized weakness but symmetric strength  Data Reviewed: CMP unremarkable CBC shows anemia Mag normal Tele shows persistent Afib 110-130   Family Communication: Wife    Disposition: Status is: Inpatient Patient is 72 y.o. M with colon cancer, admitted for obstruction  Diverting  ostomy recommended Intra-op discovered to have cecal perforation as well.  Post op course complicated by Afib with RVR, ileus, delirium.  Now on TPN           Author: Edwin Dada, MD 09/14/2022 10:45 AM  For on call review [email or web address].

## 2022-09-14 NOTE — Progress Notes (Signed)
Nutrition Follow-up  DOCUMENTATION CODES:   Not applicable  INTERVENTION:  - Per pharmacy, plan to start TPN tonight at 72mL/hr. Will provide 32g protein, 100g dextrose, and 220 [place] kcals = 674 kcals (9kcal/kg)  - TPN management per pharmacy.  - Monitor magnesium, potassium, and phosphorus BID for at least 3 days, MD to replete as needed, as pt is at [place] for refeeding syndrome given ~2 weeks minimal/no nutrition. - 100mg  thiamine in TPN for at least 5 days to aid in macronutrient metabolism due to risk of refeeding.  - If signs of refeeding, would recommend advancing by only 200-300 kcals per day  - Monitor weight trends.  - Monitor for diet advancement.   NUTRITION DIAGNOSIS:   Inadequate oral intake related to inability to eat as evidenced by NPO status. *new  GOAL:   Patient will meet greater than or equal to 90% of their needs *unmet, starting TPN  MONITOR:   Diet advancement, Labs, Weight trends, I & O's  REASON FOR ASSESSMENT:   Consult New TPN/TNA (per pharmacy)  ASSESSMENT:   72 y.o. 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, , tobacco use, hypertension, persistent atrial fibrillation on Eliquis, newly diagnosed rectal adenocarcinoma stage 3 . Imaging showing large bowel obstruction due to colorectal cancer.  3/19 Admit, NPO 3/26 starting TPN  Patient has been mostly NPO since admit 7 days ago. On clear liquids only 1 day, no intake documented that day. NG in place this AM, to LIS. Plan to start TPN this evening.   Met with patent and wife at bedside. Discussed plan to start TPN and detailed TPN. Wife reports being familiar with TPN. No questions or concerns.   Discussed patient with pharmacist. Patient at very high risk of refeeding syndrome as he is now at ~2 weeks with minimal/no nutrition. Plan to start at 45mL/hr which will provide 674 kcal (9kcal/kg) and add thiamine to TPN. Will need to  monitor electrolytes closely.   Medications reviewed and include: Insulin  Labs reviewed:  -   Diet Order:   Diet Order             Diet NPO time specified  Diet effective now                   EDUCATION NEEDS:  Education needs have been addressed  Skin:  Skin Assessment: Skin Integrity Issues: Skin Integrity Issues:: Incisions Incisions: Abdomen  Last BM:  3/25  Height:  Ht Readings from Last 1 Encounters:  09/10/22 5\' 6"  (1.676 m)   Weight:  Wt Readings from Last 1 Encounters:  09/10/22 76 kg    BMI:  Body mass index is 27.04 kg/m.  Estimated Nutritional Needs:  Kcal:  [id number] kcals Protein:  100-115 grams Fluid:  >/= 2.1L    Scott [name] RD, LDN For contact information, refer to [facility].

## 2022-09-15 DIAGNOSIS — K56609 Unspecified intestinal obstruction, unspecified as to partial versus complete obstruction: Secondary | ICD-10-CM | POA: Diagnosis not present

## 2022-09-15 DIAGNOSIS — K567 Ileus, unspecified: Secondary | ICD-10-CM | POA: Diagnosis not present

## 2022-09-15 DIAGNOSIS — C189 Malignant neoplasm of colon, unspecified: Secondary | ICD-10-CM | POA: Diagnosis not present

## 2022-09-15 DIAGNOSIS — I4819 Other persistent atrial fibrillation: Secondary | ICD-10-CM | POA: Diagnosis not present

## 2022-09-15 LAB — COMPREHENSIVE METABOLIC PANEL
ALT: 19 U/L (ref 0–44)
AST: 17 U/L (ref 15–41)
Albumin: 2.4 g/dL — ABNORMAL LOW (ref 3.5–5.0)
Alkaline Phosphatase: 56 U/L (ref 38–126)
Anion gap: 9 (ref 5–15)
BUN: 18 mg/dL (ref 8–23)
CO2: 30 mmol/L (ref 22–32)
Calcium: 8.1 mg/dL — ABNORMAL LOW (ref 8.9–10.3)
Chloride: 100 mmol/L (ref 98–111)
Creatinine, Ser: 0.86 mg/dL (ref 0.61–1.24)
GFR, Estimated: >60 mL/min (ref >60)
Glucose, Bld: 121 mg/dL — ABNORMAL HIGH (ref 70–99)
Potassium: 3.3 mmol/L — ABNORMAL LOW (ref 3.5–5.1)
Sodium: 139 mmol/L (ref 135–145)
Total Bilirubin: 0.9 mg/dL (ref 0.3–1.2)
Total Protein: 5.1 g/dL — ABNORMAL LOW (ref 6.5–8.1)

## 2022-09-15 LAB — GLUCOSE, CAPILLARY
Glucose-Capillary: 125 mg/dL — ABNORMAL HIGH (ref 70–99)
Glucose-Capillary: 157 mg/dL — ABNORMAL HIGH (ref 70–99)
Glucose-Capillary: 128 mg/dL — ABNORMAL HIGH (ref 70–99)
Glucose-Capillary: 141 mg/dL — ABNORMAL HIGH (ref 70–99)

## 2022-09-15 LAB — PHOSPHORUS: Phosphorus: 3 mg/dL (ref 2.5–4.6)

## 2022-09-15 LAB — MAGNESIUM: Magnesium: 2.2 mg/dL (ref 1.7–2.4)

## 2022-09-15 LAB — TRIGLYCERIDES: Triglycerides: 182 mg/dL — ABNORMAL HIGH (ref <150)

## 2022-09-15 MED ORDER — POTASSIUM CHLORIDE 10 MEQ/50ML IV SOLN: 10.0000 meq | INTRAVENOUS | Status: DC

## 2022-09-15 MED ORDER — POTASSIUM CHLORIDE 10 MEQ/100ML IV SOLN
10.0000 meq | INTRAVENOUS | Status: AC
  Administered 2022-09-15 (×4): 10 meq via INTRAVENOUS
  Filled 2022-09-15 (×3): qty 100

## 2022-09-15 MED ORDER — TRAVASOL 10 % IV SOLN: INTRAVENOUS | Status: DC

## 2022-09-15 MED ORDER — OXYCODONE HCL 5 MG PO TABS: 5.0000 mg | ORAL_TABLET | ORAL | Status: DC | PRN

## 2022-09-15 MED ORDER — ACETAMINOPHEN 325 MG PO TABS: 650.0000 mg | ORAL_TABLET | Freq: Four times a day (QID) | ORAL | Status: DC | PRN

## 2022-09-15 MED ORDER — TRAVASOL 10 % IV SOLN
INTRAVENOUS | Status: AC
  Filled 2022-09-15: qty 540

## 2022-09-15 NOTE — Evaluation (Signed)
Occupational Therapy Evaluation Patient Details Name: Scott [name] MRN: [id number] DOB: Jul 16, 1950 Today's Date: 09/15/2022   History of Present Illness Patient is a 72 year old who presented with abdominal distension. In the ER, CT showed suspected distal colonic obstruction secondary to 2.7 cm mid/distal rectal mass. s/p colectomy/colostomy 09/10/22, cecal perforation noted intraoperatively.3/24 patient was transitioned to SDU with A fib with RVR. 3/25 patient imaging shown ileus with delirium. PMH: perm AF,hx aortic aneurysm s/p stent, CKD IIIa baseline 1.2 and recently diagnosed colon CA, dCHF, and HTN   Clinical Impression   Patient is a 72 year old male who presented for above. Patient was living at home independently prior level with wife. Patient was noted to have been signed off by OT services on 3/23 with patient noted to have had medical change since with decreased standing balance, decreased functional activity tolerance, decreased safety awareness impacting participation in ADLs. Patients HR noted to reach 133 bpm during activity. Patient would continue to benefit from skilled OT services at this time while admitted and after d/c to address noted deficits in order to improve overall safety and independence in ADLs.       Recommendations for follow up therapy are one component of a multi-disciplinary discharge planning process, led by the attending physician.  Recommendations may be updated based on patient status, additional functional criteria and insurance authorization.   Assistance Recommended at Discharge Intermittent Supervision/Assistance  Patient can return home with the following Assistance with cooking/housework;Direct supervision/assist for medications management;Assist for transportation;Direct supervision/assist for financial management;Help with stairs or ramp for entrance;A little help with bathing/dressing/bathroom;A little help with walking and/or transfers    Functional  Status Assessment  Patient has had a recent decline in their functional status and demonstrates the ability to make significant improvements in function in a reasonable and predictable amount of time.  Equipment Recommendations  None recommended by OT       Precautions / Restrictions Precautions Precautions: Other (comment) Precaution Comments: abdominal; instructed in log roll, NG tube Restrictions Weight Bearing Restrictions: No      Mobility Bed Mobility Overal bed mobility: Needs Assistance Bed Mobility: Supine to Sit     Supine to sit: HOB elevated, Min assist     General bed mobility comments: HOB raised to 90 degrees per patient request. patient does not have articulating bed at home        Balance Overall balance assessment: Needs assistance Sitting-balance support: Single extremity supported, Feet supported Sitting balance-Leahy Scale: Fair       Standing balance-Leahy Scale: Poor           ADL either performed or assessed with clinical judgement   ADL Overall ADL's : Needs assistance/impaired   Eating/Feeding Details (indicate cue type and reason): patient is starting trials for sips today per nurse report. NPO otherwise Grooming: Minimal assistance;Sitting Grooming Details (indicate cue type and reason): NG tube Upper Body Bathing: Minimal assistance;Sitting   Lower Body Bathing: Minimal assistance Lower Body Bathing Details (indicate cue type and reason): able to complete figure four positioning sitting EOB simulated Upper Body Dressing : Min guard;Sitting   Lower Body Dressing: Minimal assistance;Sitting/lateral leans   Toilet Transfer: Minimal assistance;Ambulation Toilet Transfer Details (indicate cue type and reason): no AD patient appeared to have poor insight to deficits with increased fatigue with monimal movements with patient noted to need min A for balance that progressed to more physical A to get back to relciner with patient delcining to  sit in alternative chair.  Toileting- Clothing Manipulation and Hygiene: Maximal assistance;Sit to/from stand                Pertinent Vitals/Pain Pain Assessment Pain Assessment: 0-10 Pain Score: 0-No pain Pain Location: reporetd having taken Dilaudid this AM     Hand Dominance Right   Extremity/Trunk Assessment Upper Extremity Assessment Upper Extremity Assessment: Overall WFL for tasks assessed   Lower Extremity Assessment Lower Extremity Assessment: Defer to PT evaluation   Cervical / Trunk Assessment Cervical / Trunk Assessment: Normal   Communication Communication Communication: No difficulties   Cognition Arousal/Alertness: Awake/alert Behavior During Therapy: WFL for tasks assessed/performed Overall Cognitive Status: Within Functional Limits for tasks assessed         General Comments: patient reported feeling "off" after taking pain medications this AM. appropirate during session. wife present.                Home Living Family/patient expects to be discharged to:: Private residence Living Arrangements: Spouse/significant other Available Help at Discharge: Family Type of Home: House Home Access: Stairs to enter Technical brewer of Steps: 2 Entrance Stairs-Rails: None Home Layout: One level     Bathroom Shower/Tub: Occupational psychologist: Handicapped height     [place]: Conservation officer, nature (2 wheels);Hand held shower head;Shower seat - built in;BSC/3in1   Additional Comments: Renovating house to make more accessible. Putting into wider dorrs to walk in shower and doors throughout house. Will be placing grab bars in sower and my commode.      Prior Functioning/Environment Prior Level of Function : Independent/Modified Independent;Driving             Mobility Comments: walks without AD, no falls in past 6 months          OT Problem List: Pain;Decreased activity tolerance;Impaired balance (sitting and/or  standing);Decreased safety awareness;Decreased knowledge of precautions;Cardiopulmonary status limiting activity      OT Treatment/Interventions: Self-care/ADL training;Energy conservation;Therapeutic exercise;DME and/or AE instruction;Therapeutic activities;Patient/family education;Balance training    OT Goals(Current goals can be found in the care plan section) Acute Rehab OT Goals Patient Stated Goal: to get up out of bed OT Goal Formulation: With patient Time For Goal Achievement: 09/29/22 Potential to Achieve Goals: Fair  OT Frequency: Min 2X/week    Co-evaluation PT/OT/SLP Co-Evaluation/Treatment: Yes Reason for Co-Treatment: To address functional/ADL transfers PT goals addressed during session: Mobility/safety with mobility OT goals addressed during session: ADL's and self-care      AM-PAC OT "6 Clicks" Daily Activity     Outcome Measure Help from another person eating meals?: Total (NPO) Help from another person taking care of personal grooming?: A Little Help from another person toileting, which includes using toliet, bedpan, or urinal?: A Lot Help from another person bathing (including washing, rinsing, drying)?: A Lot Help from another person to put on and taking off regular upper body clothing?: A Little Help from another person to put on and taking off regular lower body clothing?: A Lot 6 Click Score: 13   End of Session Nurse Communication: Other (comment) (present during portion of session)  Activity Tolerance: Patient tolerated treatment well Patient left: in chair;with call bell/phone within reach;with family/visitor present  OT Visit Diagnosis: Pain                Time: [id number] OT Time Calculation (min): 17 min Charges:  OT General Charges $OT Visit: 1 Visit OT Evaluation $OT Eval Moderate Complexity: 1 Mod  Jhovani Griswold OTR/L, MS Acute Rehabilitation Department Office# [phone]  Willa Rough 09/15/2022, 10:20 AM

## 2022-09-15 NOTE — TOC Initial Note (Signed)
Transition of Care [facility]) - Initial/Assessment Note    Patient Details  Name: Scott [name] MRN: [id number] Date of Birth: May 08, 1951  Transition of Care [facility]) CM/SW Contact:    Roseanne Kaufman, RN Phone Number: 09/15/2022, 5:53 PM  Clinical Narrative:     Per chart review patient being treated for large bowel obstruction, had colostomy surgery on 09/10/22. This RNCM spoke with patient's daughter [name] to offer choice for [facility] services. Family chose any [place] agency that will accept patient's insurance. This RNCM notified Amy with [facility] who will follow patient post discharge for [facility] services (daughter Si Raider [phone] is the best contact).  [facility]'s contact information is attached to AVS.   TOC will continue to follow for needs             Expected Discharge Plan: [place] Barriers to Discharge: Continued Medical Work up   Patient Goals and CMS Choice Patient states their goals for this hospitalization and ongoing recovery are:: return home with home health CMS Medicare.gov Compare Post Acute Care list provided to:: Patient Represenative (must comment) [name] Lacinda Axon (daughter)) Choice offered to / list presented to : Adult [place] ownership interest in [facility].provided to:: Adult Children    Expected Discharge Plan and Services In-house Referral: NA Discharge Planning Services: CM Consult Post Acute Care Choice: NA Living arrangements for the past 2 months: Single Family Home                 DME Arranged: N/A DME Agency: NA       HH Arranged: RN (new ostomy) [place] Agency: [place] Date [place]: 09/15/22 Time [place]: 1752 Representative spoke with at [place]: Amy  Prior Living Arrangements/Services Living arrangements for the past 2 months: [place] with:: Spouse Patient language and need for interpreter reviewed:: Yes Do you feel safe going back to the place where you live?:  Yes      Need for Family Participation in Patient Care: Yes (Comment) Care giver support system in place?: Yes (comment) Current home services: DME (cane, walker, commode seat, 3N1, ramp, wheelchar,having shower w/seat installed) Criminal Activity/Legal Involvement Pertinent to Current Situation/Hospitalization: No - Comment as needed  Activities of Daily Living Home Assistive Devices/Equipment: Blood pressure cuff, Eyeglasses ADL Screening (condition at time of admission) Patient's cognitive ability adequate to safely complete daily activities?: Yes Is the patient deaf or have difficulty hearing?: No Does the patient have difficulty seeing, even when wearing glasses/contacts?: No Does the patient have difficulty concentrating, remembering, or making decisions?: No Patient able to express need for assistance with ADLs?: No Does the patient have difficulty dressing or bathing?: No Independently performs ADLs?: Yes (appropriate for developmental age) Does the patient have difficulty walking or climbing stairs?: No Weakness of Legs: None Weakness of Arms/Hands: None  Permission Sought/Granted Permission sought to share information with : Case Manager Permission granted to share information with : Yes, Verbal Permission Granted  Share Information with NAME: Case manager           Emotional Assessment Appearance:: Appears stated age Attitude/Demeanor/Rapport: Engaged Affect (typically observed): Accepting Orientation: : Oriented to Self, Oriented to Place, Oriented to  Time, Oriented to Situation Alcohol / Substance Use: Not Applicable Psych Involvement: No (comment)  Admission diagnosis:  Hypokalemia [E87.6] Transaminitis [R74.01] Large bowel obstruction [facility]) [K56.609] Patient Active Problem List   Diagnosis Date Noted   Delirium due to multiple etiologies, acute, hyperactive 09/13/2022  Ileus ([place]) 09/13/2022   Perforation of cecum 09/10/2022   Hives 09/10/2022   PVD  (peripheral vascular disease) ([place]) 09/08/2022   Colon cancer ([place]) 09/08/2022   Normocytic anemia 09/08/2022   Elevated LFTs 09/07/2022   Proctitis 08/18/2022   Large bowel obstruction ([place]) 08/18/2022   Stage 3a chronic kidney disease (CKD) ([place]) 08/18/2022   Secondary hypercoagulable state ([place]) 08/26/2021   Acute respiratory failure with hypoxia ([name])    Hypokalemia    Persistent atrial fibrillation ([place])    CAP (community acquired pneumonia) 08/12/2021   Essential hypertension 02/14/2020   Tobacco abuse, 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   Dissection of abdominal aorta ([place]) 01/29/2020   Dissecting AAA (abdominal aortic aneurysm) ([place]) 01/29/2020   Osteoarthritis 05/31/2010   PCP:  Clinic, [place], [place] [address] Phone: [phone] Fax: [place], [place] - [address] [place] [address] Phone: [phone] Fax: [phone]  [place], [place] - [place] [address] [place] [address] [place] [address] Phone: [phone] Fax: [phone]  [facility] [phone] - [place], [place] AT [id number] [place] [address] Phone: [phone] Fax: [phone]     Social Determinants of Health (SDOH) Social History: SDOH Screenings   Food Insecurity: No Food Insecurity (09/07/2022)  Housing: Low Risk  (09/07/2022)  Transportation Needs: No Transportation Needs (09/07/2022)  Utilities: Not At Risk (09/07/2022)  Depression (PHQ2-9): Low Risk  (08/27/2021)  Social Connections: Unknown (07/09/2020)  Tobacco Use: Medium Risk (09/13/2022)   SDOH Interventions:     Readmission Risk Interventions    09/15/2022    5:47 PM  Readmission Risk Prevention Plan  Transportation Screening Complete  PCP or  Specialist Appt within 3-5 Days Complete  HRI or [place] Complete  Social Work Consult for [place] Planning/Counseling Complete  Palliative Care Screening Not Applicable  Medication Review Press photographer) Complete

## 2022-09-15 NOTE — Evaluation (Signed)
Physical Therapy Evaluation Patient Details Name: Scott [name] MRN: [id number] DOB: 1950-07-16 Today's Date: 09/15/2022  History of Present Illness  Patient is a 72 year old who presented with abdominal distension. In the ER, CT showed suspected distal colonic obstruction secondary rectal mass. 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 3/25 patient imaging shown ileus with delirium. PMH: perm AF,hx aortic aneurysm s/p stent, CKD IIIa baseline 1.2 and recently diagnosed colon CA, dCHF, and HTN  Clinical Impression  Pt admitted with above diagnosis.  Pt currently with functional limitations due to the deficits listed below (see PT Problem List). Pt will benefit from acute skilled PT to increase their independence and safety with mobility to allow discharge.     The patient reports that he  feels 'Off" after having taken pain medication. Patient ambulated x 60' with min Assist to steady. Patient  should progress to return to independent ambulation.     Recommendations for follow up therapy are one component of a multi-disciplinary discharge planning process, led by the attending physician.  Recommendations may be updated based on patient status, additional functional criteria and insurance authorization.  Follow Up Recommendations       Assistance Recommended at Discharge Set up Supervision/Assistance  Patient can return home with the following  A little help with bathing/dressing/bathroom;Assistance with cooking/housework;Assist for transportation    Equipment Recommendations None recommended by PT  Recommendations for Other Services       Functional Status Assessment Patient has had a recent decline in their functional status and demonstrates the ability to make significant improvements in function in a reasonable and predictable amount of time.     Precautions / Restrictions Precautions Precautions:  Other (comment) Precaution Comments: abdominal wound VAC, instructed in log roll, NG tube, colostomy Restrictions Weight Bearing Restrictions: No      Mobility  Bed Mobility               General bed mobility comments: HOB raised to 90 degrees per patient request. min assist to pull up to sit    Transfers Overall transfer level: Needs assistance Equipment used: 1 person hand held assist Transfers: Sit to/from Stand Sit to Stand: Min assist           General transfer comment: steady support, somewhat woozie    Ambulation/Gait Ambulation/Gait assistance: Min assist, Counsellor (Feet): 60 Feet Assistive device: 1 person hand held assist Gait Pattern/deviations: Drifts right/left       General Gait Details: unsteady to ambulate, light support due to balance. Patient self limited due  feeling woozey.  Stairs            Wheelchair Mobility    Modified Rankin (Stroke Patients Only)       Balance   Sitting-balance support: Single extremity supported, Feet supported Sitting balance-Leahy Scale: Fair       Standing balance-Leahy Scale: Poor Standing balance comment: patient had  medication earlier                             Pertinent Vitals/Pain Pain Assessment Pain Location: reporetd having taken Dilaudid this AM Pain Descriptors / Indicators: Operative site guarding    Home Living Family/patient expects to be discharged to:: Private residence Living Arrangements: Spouse/significant other Available Help at Discharge: Family Type of Home: House Home Access: Stairs to enter Entrance Stairs-Rails: None Entrance Stairs-Number of Steps: 2  Home Layout: One level Home Equipment: Conservation officer, nature (2 wheels);Hand held shower head;Shower seat - built in;BSC/3in1 Additional Comments: Renovating house to make more accessible. Putting into wider dorrs to walk in shower and doors throughout house. Will be placing grab bars in sower  and my commode.    Prior Function Prior Level of Function : Independent/Modified Independent;Driving             Mobility Comments: walks without AD, no falls in past 6 months       Hand Dominance   Dominant Hand: Right    Extremity/Trunk Assessment   Upper Extremity Assessment Upper Extremity Assessment: Overall WFL for tasks assessed    Lower Extremity Assessment Lower Extremity Assessment: Overall WFL for tasks assessed    Cervical / Trunk Assessment Cervical / Trunk Assessment: Normal  Communication   Communication: No difficulties  Cognition Arousal/Alertness: Awake/alert Behavior During Therapy: WFL for tasks assessed/performed Overall Cognitive Status: Within Functional Limits for tasks assessed                                 General Comments: patient reported feeling "off" after taking pain medications this AM. appropirate during session. wife present.        General Comments      Exercises     Assessment/Plan    PT Assessment Patient needs continued PT services  PT Problem List Decreased strength;Decreased activity tolerance;Decreased mobility;Decreased safety awareness;Decreased balance;Decreased knowledge of use of DME       PT Treatment Interventions DME instruction;Therapeutic activities;Gait training;Functional mobility training;Patient/family education    PT Goals (Current goals can be found in the Care Plan section)  Acute Rehab PT Goals Patient Stated Goal: walk PT Goal Formulation: With patient/family Time For Goal Achievement: 09/29/22 Potential to Achieve Goals: Good    Frequency Min 3X/week     Co-evaluation   Reason for Co-Treatment: To address functional/ADL transfers PT goals addressed during session: Mobility/safety with mobility OT goals addressed during session: ADL's and self-care       AM-PAC PT "6 Clicks" Mobility  Outcome Measure Help needed turning from your back to your side while in a flat bed  without using bedrails?: A Little Help needed moving from lying on your back to sitting on the side of a flat bed without using bedrails?: A Little Help needed moving to and from a bed to a chair (including a wheelchair)?: A Little Help needed standing up from a chair using your arms (e.g., wheelchair or bedside chair)?: A Little Help needed to walk in hospital room?: A Little Help needed climbing 3-5 steps with a railing? : A Lot 6 Click Score: 17    End of Session Equipment Utilized During Treatment: Gait belt Activity Tolerance: Patient tolerated treatment well Patient left: in chair;with call bell/phone within reach;with family/visitor present Nurse Communication: Mobility status PT Visit Diagnosis: Unsteadiness on feet (R26.81);Difficulty in walking, not elsewhere classified (R26.2)    Time: [id number] PT Time Calculation (min) (ACUTE ONLY): 16 min   Charges:   PT Evaluation $PT Re-evaluation: [place] Office [phone] Weekend [id number]   Claretha Cooper 09/15/2022, 1:42 PM

## 2022-09-15 NOTE — Consult Note (Addendum)
WOC Nurse Wound Consult Note: Surgical PA at the bedside for pouch change and wound assessment. Pt was medicated for pain prior to the procedure and tolerated with minimal amt discomfort.  Midline abd with full thickness post-op wound is beefy red and moist. Applied one piece black foam to 161mm cont suction.  WOC will change the dressing again on Fri.    WOC Nurse ostomy consult note Stoma type/location: Pt had colosotmy surgery performed 3/22.  He is critically ill in ICU. Wife at the bedside assisted with pouch change and asked appropriate questions. Stoma is in close proximity to the abd wound and pouch will need to be replaced during each Vac change.  Stomal assessment/size: Stoma is red and viable, 1 1/2 inches, slightly above skin level, located in a crease at 3:00 o'clock and 9:00 o'clock. There is a small amt tan pus draining at 3:00 from the edge of the stoma, surgical team probed with swab and there is minimal amt depth and no mucutaneous separation.  Peristomal assessment: intact skin surrounding Output: 80cc liquid brown stool Ostomy pouching: Wife was able to stretch barrier ring to fit and apply to the back of the pouch and apply pouch to stoma. She is able to open and close to empty.  Pt watched the procedure but did not participate. 5 sets of supplies ordered to the bedside; use barrier rings, Kellie Simmering # [phone] and one piece flexible pouches Kellie Simmering # [phone]  Enrolled patient in [place] program: Yes, today. WOC will perform another pouch change and Vac change on Fri.  Thank-you,  [name] MSN, [place], [place], [place], [place]

## 2022-09-15 NOTE — Progress Notes (Signed)
Progress Note   Patient: Scott [name] [id number] DOB: January 18, 1951 DOA: 09/07/2022     8 DOS: the patient was seen and examined on 09/15/2022   Brief hospital course: Mr. Virden is a 72 y.o. 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  In the ER, CT showed suspected distal colonic obstruction secondary to 2.7 cm mid/distal rectal mass.    3/19: Admitted, Gen Surg consulted, felt he had failed nonoperative management 3/20: GI consulted, unable to stent 3/21: Gen Surg planning for surgery tomorrow 3/22: Underwent Colostomy with Dr. Marcello Moores; intra-operatively also found to have cecal perforation incidentally, and underwent right colectomy with primary anastomosis; developed hives post-op 3/23: NG and foley out, doing well 3/24: Into afib with RVR, transferred to SDU 3/25: Delirious, AXR shows ileus 3/26: Delirium resolved, TPN started  Assessment and Plan: * Large bowel obstruction ([place]) PMH Colon cancer ([place]) Admitted and Gen Surg/GI consulted.  Unfortunately, not amenable to stenting, so went to OR for colostomy 3/22 Continue post op care, wound vac per Gen Surg;surgery considering CT for purulent drainage Follow up with Surgery and Oncology after discharge  Perforation of cecum Incidental and unexpected finding intra-operatively. Underwent right colectomy with primary anastomosis (in addition to colostomy) Continue Zosyn   Ileus ([place]) TPN, NGT per Gen Surg, Planning clears now IV fluids and electrolytes per TPN pharmacist  Persistent atrial fibrillation ([place]) Afib with RVR Baseline is pers AF rates ~100.  Has been off Eliquis since start of month when he self-held due to rectal bleeding from cancer.    Still with NG but starting clears, will continue scheduled IV metoprolol, dilt drip, transition to PO when able to remove NG. Holding home  labetalol/ Hold Eliquis for now, resume when cleared by Gen Surg, at present do not feel compelling need for anticoagulation, will hold heparin  Delirium due to multiple etiologies, acute, hyperactive ICU delirium Resolved. Standard delirium precautions. Avoid benzos  Hives Resolved. Brief, noted incidentally post-op.  Possibly due to cefotetan unclear.    Normocytic anemia Stable. 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 Cr stable relative to baseline, acute kidney injury ruled out  Hypokalemia Per TPN pharamcy  Essential hypertension BP stable. Holding Entresto and spironolactone and home labetalol Continue Metoprolol and diltiazem     Subjective:  Feels ok Pain controlled Wants to get up  Physical Exam: Vitals:   09/15/22 0400 09/15/22 0442 09/15/22 0500 09/15/22 0600  BP: 128/60  (!) 160/79 (!) 156/74  Pulse: 61  (!) 46 80  Resp: 16  18 15   Temp:  98 F (36.7 C)    TempSrc:  Oral    SpO2: 97%  97% 96%  Weight:      Height:       Physical Exam Vitals reviewed.  Constitutional:      General: He is not in acute distress.    Appearance: He is not ill-appearing or toxic-appearing.  Cardiovascular:     Rate and Rhythm: Normal rate and regular rhythm.     Heart sounds: No murmur heard. Pulmonary:     Effort: Pulmonary effort is normal. No respiratory distress.     Breath sounds: No wheezing, rhonchi  or rales.  Abdominal:     Palpations: Abdomen is soft.  Neurological:     Mental Status: He is alert.  Psychiatric:        Mood and Affect: Mood normal.        Behavior: Behavior normal.     Data Reviewed: K+ 3.3  Family Communication: wife at bedside  Disposition: Status is: Inpatient Remains inpatient appropriate because: ileus, NPO, on  TPN  Planned Discharge Destination: Home    Time spent: 25 minutes  Author: Murray Hodgkins, MD 09/15/2022 8:08 AM  For on call review [email or web address].

## 2022-09-15 NOTE — Progress Notes (Signed)
Progress Note  5 Days Post-Op  Subjective: Pt much more with it this AM and wife reports confusion has been much improved. Abdominal pain improving. Having ostomy output. Seen with WOC at bedside.   Objective: Vital signs in last 24 hours: Temp:  [97 F (36.1 C)-98.3 F (36.8 C)] 98 F (36.7 C) (03/27 0442) Pulse Rate:  [30-138] 80 (03/27 0600) Resp:  [11-26] 15 (03/27 0600) BP: (124-180)/(60-114) 156/74 (03/27 0600) SpO2:  [96 %-100 %] 96 % (03/27 0600) Last BM Date : 09/13/22  Intake/Output from previous day: 03/26 0701 - 03/27 0700 In: 434 [I.V.:84.3; IV Piggyback:349.7] Out: [id number] [Urine:1925; Emesis/NG output:1500; Drains:50; Stool:100] Intake/Output this shift: No intake/output data recorded.  PE: General: pleasant, WD, WN male who is laying in bed in NAD Lungs: Respiratory effort nonlabored Abd: soft, NT, ND, +BS, stoma with some purulent drainage from 3 o'clock position, midline wound clean as pictured below   Psych: A&Ox3 with an appropriate affect.    Lab Results:  Recent Labs    09/13/22 0234 09/14/22 0317  WBC 11.1* 7.4  HGB 10.1* 9.4*  HCT 31.6* 29.5*  PLT 282 286   BMET Recent Labs    09/14/22 0317 09/15/22 0459  NA 139 139  K 4.1 3.3*  CL 107 100  CO2 24 30  GLUCOSE 96 121*  BUN 23 18  CREATININE 0.87 0.86  CALCIUM 8.2* 8.1*   PT/INR No results for input(s): "LABPROT", "INR" in the last 72 hours. CMP     Component Value Date/Time   NA 139 09/15/2022 0459   K 3.3 (L) 09/15/2022 0459   CL 100 09/15/2022 0459   CO2 30 09/15/2022 0459   GLUCOSE 121 (H) 09/15/2022 0459   BUN 18 09/15/2022 0459   CREATININE 0.86 09/15/2022 0459   CALCIUM 8.1 (L) 09/15/2022 0459   PROT 5.1 (L) 09/15/2022 0459   ALBUMIN 2.4 (L) 09/15/2022 0459   AST 17 09/15/2022 0459   ALT 19 09/15/2022 0459   ALKPHOS 56 09/15/2022 0459   BILITOT 0.9 09/15/2022 0459   GFRNONAA >60 09/15/2022 0459   GFRAA >60 02/06/2020 0436   Lipase     Component Value  Date/Time   LIPASE 22 09/07/2022 1534       Studies/Results: DG Abd 1 View  Result Date: 09/13/2022 CLINICAL DATA:  Check gastric catheter placement EXAM: ABDOMEN - 1 VIEW COMPARISON:  None Available. FINDINGS: Gastric catheter is noted within the stomach. Descending aortic stent graft is seen. Scattered large and small bowel gas is noted. Mild persistent small bowel dilatation is seen. No free air is noted. IMPRESSION: Gastric catheter within the stomach. Electronically Signed   By: Inez Catalina M.D.   On: 09/13/2022 19:22   DG Abd 1 View  Result Date: 09/13/2022 CLINICAL DATA:  NG tube placement EXAM: ABDOMEN - 1 VIEW COMPARISON:  Previous studies including the examination of 09/12/2022 FINDINGS: Tip of NG tube is seen in the region of fundus of the stomach. Side port in the NG tube is not distinctly visualized. There is presence of gas in slightly dilated small bowel loops. There is previous endovascular stent repair in thoracic aorta. IMPRESSION: Tip of NG tube is seen in the fundus of the stomach. Electronically Signed   By: Elmer Picker M.D.   On: 09/13/2022 12:41    Anti-infectives: Anti-infectives (From admission, onward)    Start     Dose/Rate Route Frequency Ordered Stop   09/10/22 1600  piperacillin-tazobactam (ZOSYN) IVPB 3.375 g  3.375 g 12.5 mL/hr over 240 Minutes Intravenous Every 8 hours 09/10/22 1520 09/15/22 2359   09/10/22 0600  cefoTEtan (CEFOTAN) 2 g in sodium chloride 0.9 % 100 mL IVPB        2 g 200 mL/hr over 30 Minutes Intravenous On call to O.R. 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  POD5 S/p right hemicolectomy, end colostomy and port placement  - having bowel function and abdomen less distended, mentation better - ok to clamp NGT and allow sips of clears this AM - continue VAC while admitted but wound is shallow, will switch to WTD on discharge - some purulent drainage noted  from stoma in the 3 o'clock position, abx to stop today - will discuss with MD, may get CT to r/o larger collection intraabdominally  - continue to mobilize as able - WOC following for ostomy teaching  FEN: TPN, ok to clamp NGT and allow sips of clears  VTE: SQH ID: [email or web address] 3/22>3/27 to be completed today   LOS: 8 days     Norm Parcel, [facility] Surgery 09/15/2022, 8:51 AM Please see Amion for pager number during day hours 7:00am-4:30pm

## 2022-09-15 NOTE — Progress Notes (Signed)
PHARMACY - TOTAL PARENTERAL NUTRITION CONSULT NOTE   Indication: Prolonged ileus  Patient Measurements: Height: 5\' 6"  (167.6 cm) Weight: 76 kg (167 lb 8 oz) IBW/kg (Calculated) : 63.8 TPN AdjBW (KG): 76 Body mass index is 27.04 kg/m.  Assessment: 72 year old male with rectal adenocarcinoma s/p colostomy. Intra-operatively, incidental finding of cecal perforation and underwent right colectomy with primary anastomosis. Patient with ileus, NGT placed 3/25. Pharmacy consulted to manage TPN.  Glucose / Insulin: no history of DM -CBGs < 150 -1u SSI Electrolytes: -K slightly low, Mg/phos WNL -Corrected Ca 9.5 Renal: stable Hepatic: LFTs WNL Intake / Output; MIVF:  -UOP 1225 ml documented -NG output 1500 ml -No MIVF GI Imaging: -3/24 AXR concerning for mid to distal small bowel obstruction GI Surgeries / Procedures:  -3/22 loop colostomy placement, right colectomy  Central access: port placed 3/22 TPN start date: 3/26  Nutritional Goals: Goal TPN rate is 100 mL/hr (provides 108 g of protein and 2246 kcals per day)  RD Assessment: Estimated Needs Total Energy Estimated Needs: 2100-2300 kcals Total Protein Estimated Needs: 100-115 grams Total Fluid Estimated Needs: >/= 2.1L  Current Nutrition: NPO  Plan:  Potassium 10 mEq IV x 4  Increase TPN to 50 mL/hr at 1800  Electrolytes in TPN: Na 55 mEq/L, K 50 mEq/L, Ca 5 mEq/L, Mg 5 mEq/L, and Phos 15 mmol/L. Cl:Ac 1:1  Add standard MVI and trace elements to TPN, plan 5 days of thiamine in TPN (Day 2 of 5)  Continue Sensitive q8h SSI and adjust as needed   Monitor TPN labs on Mon/Thurs, daily for at least 3 days given refeeding risk   Tawnya Crook, PharmD, BCPS Clinical Pharmacist 09/15/2022 11:25 AM

## 2022-09-16 DIAGNOSIS — K56609 Unspecified intestinal obstruction, unspecified as to partial versus complete obstruction: Secondary | ICD-10-CM | POA: Diagnosis not present

## 2022-09-16 DIAGNOSIS — I4819 Other persistent atrial fibrillation: Secondary | ICD-10-CM | POA: Diagnosis not present

## 2022-09-16 DIAGNOSIS — I10 Essential (primary) hypertension: Secondary | ICD-10-CM | POA: Diagnosis not present

## 2022-09-16 DIAGNOSIS — C189 Malignant neoplasm of colon, unspecified: Secondary | ICD-10-CM | POA: Diagnosis not present

## 2022-09-16 LAB — GLUCOSE, CAPILLARY
Glucose-Capillary: 150 mg/dL — ABNORMAL HIGH (ref 70–99)
Glucose-Capillary: 135 mg/dL — ABNORMAL HIGH (ref 70–99)
Glucose-Capillary: 122 mg/dL — ABNORMAL HIGH (ref 70–99)

## 2022-09-16 LAB — COMPREHENSIVE METABOLIC PANEL
ALT: 27 U/L (ref 0–44)
AST: 36 U/L (ref 15–41)
Albumin: 2.8 g/dL — ABNORMAL LOW (ref 3.5–5.0)
Alkaline Phosphatase: 91 U/L (ref 38–126)
Anion gap: 8 (ref 5–15)
BUN: 16 mg/dL (ref 8–23)
CO2: 24 mmol/L (ref 22–32)
Calcium: 8.5 mg/dL — ABNORMAL LOW (ref 8.9–10.3)
Chloride: 103 mmol/L (ref 98–111)
Creatinine, Ser: 0.71 mg/dL (ref 0.61–1.24)
GFR, Estimated: >60 mL/min (ref >60)
Glucose, Bld: 137 mg/dL — ABNORMAL HIGH (ref 70–99)
Potassium: 3.6 mmol/L (ref 3.5–5.1)
Sodium: 135 mmol/L (ref 135–145)
Total Bilirubin: 0.9 mg/dL (ref 0.3–1.2)
Total Protein: 5.9 g/dL — ABNORMAL LOW (ref 6.5–8.1)

## 2022-09-16 LAB — CBC
HCT: 33.8 % — ABNORMAL LOW (ref 39.0–52.0)
Hemoglobin: 11.1 g/dL — ABNORMAL LOW (ref 13.0–17.0)
MCH: 30.2 pg (ref 26.0–34.0)
MCHC: 32.8 g/dL (ref 30.0–36.0)
MCV: 92.1 fL (ref 80.0–100.0)
Platelets: 349 10*3/uL (ref 150–400)
RBC: 3.67 MIL/uL — ABNORMAL LOW (ref 4.22–5.81)
RDW: 14.6 % (ref 11.5–15.5)
WBC: 7.3 10*3/uL (ref 4.0–10.5)
nRBC: 0 % (ref 0.0–0.2)

## 2022-09-16 LAB — MAGNESIUM: Magnesium: 2.2 mg/dL (ref 1.7–2.4)

## 2022-09-16 LAB — PHOSPHORUS: Phosphorus: 3.1 mg/dL (ref 2.5–4.6)

## 2022-09-16 MED ORDER — FUROSEMIDE 10 MG/ML IJ SOLN
20.0000 mg | Freq: Once | INTRAMUSCULAR | Status: AC
  Administered 2022-09-16: 20 mg via INTRAVENOUS
  Filled 2022-09-16: qty 2

## 2022-09-16 MED ORDER — FUROSEMIDE 40 MG PO TABS
40.0000 mg | ORAL_TABLET | Freq: Every day | ORAL | Status: DC
  Administered 2022-09-17 – 2022-09-18 (×2): 40 mg via ORAL
  Filled 2022-09-16 (×2): qty 1

## 2022-09-16 NOTE — Progress Notes (Signed)
Progress Note   Patient: Scott [name] [id number] DOB: December 06, 1950 DOA: 09/07/2022     9 DOS: the patient was seen and examined on 09/16/2022   Brief hospital course: Mr. Briner is a 72 y.o. 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  In the ER, CT showed suspected distal colonic obstruction secondary to 2.7 cm mid/distal rectal mass.  3/19: Admitted, Gen Surg consulted, felt he had failed nonoperative management 3/20: GI consulted, unable to stent 3/21: Gen Surg planning for surgery tomorrow 3/22: Underwent Colostomy with Dr. Marcello Moores; intra-operatively also found to have cecal perforation incidentally, and underwent right colectomy with primary anastomosis; developed hives post-op 3/23: NG and foley out, doing well 3/24: Into afib with RVR, transferred to SDU 3/25: Delirious, AXR shows ileus 3/26: Delirium resolved, TPN started 3/28: NGT out, diet advanced, weaning TPN  Assessment and Plan: * Large bowel obstruction ([name]) PMH Colon cancer ([place]) Admitted and Gen Surg/GI consulted.  Unfortunately, not amenable to stenting, so went to OR for colostomy 3/22 Continue post op care, wound vac per Gen Surg; surgery ordered CT to evaluate purulent drainage NG tube out.  Diet advance per surgery. Follow up with Surgery and Oncology after discharge   Perforation of cecum Incidental and unexpected finding intra-operatively. Underwent right colectomy with primary anastomosis (in addition to colostomy) Completed Zosyn    Ileus ([place]) TPN being weaned, NGT out per Gen Surg, diet being advanced    Persistent atrial fibrillation ([name]) Afib with RVR Has been off Eliquis since start of month when he self-held due to rectal bleeding from cancer.    Now on diet.  If tolerates will change to oral rate control agents.  However currently n.p.o. after midnight for  surgery.  Will continue IV for now. Holding home labetalol/holding Eliquis for now, resume when cleared by Gen Surg, at present do not feel compelling need for anticoagulation, will hold heparin   Delirium due to multiple etiologies, acute, hyperactive ICU delirium Resolved. Standard delirium precautions. Avoid benzos   Hives Resolved. Brief, noted incidentally post-op.  Possibly due to cefotetan unclear.     Normocytic anemia Stable. 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 Cr stable relative to baseline, acute kidney injury ruled out   Hypokalemia Per TPN pharamcy   Essential hypertension BP stable. Holding Entresto and spironolactone and home labetalol Continue Metoprolol and diltiazem      Subjective:  Feels better Except some SOB NGT out Now for diet Hopefully home in 48 hours  Physical Exam: Vitals:   09/16/22 0745 09/16/22 0800 09/16/22 0810 09/16/22 0849  BP:      Pulse:  65 91 (!) 58  Resp:  18 17 19   Temp: (!) 97.4 F (36.3 C)     TempSrc: Oral     SpO2:  99% 98% 98%  Weight:      Height:       Physical Exam Vitals reviewed.  Constitutional:      General: He is not in acute distress.    Appearance: He is not ill-appearing or toxic-appearing.  Cardiovascular:     Rate and Rhythm: Normal rate and regular rhythm.  Heart sounds: No murmur heard. Pulmonary:     Effort: Pulmonary effort is normal. No respiratory distress.     Breath sounds: No wheezing, rhonchi or rales.  Musculoskeletal:     Right lower leg: No edema.     Left lower leg: No edema.  Neurological:     Mental Status: He is alert.  Psychiatric:        Mood and Affect: Mood normal.        Behavior: Behavior normal.     Data Reviewed: CMP noted Hgb up to  11.1  Family Communication: wife at bedside  Disposition: Status is: Inpatient Remains inpatient appropriate because: s/p surgery  Planned Discharge Destination: Home    Time spent: 20 minutes  Author: Murray Hodgkins, MD 09/16/2022 9:20 AM  For on call review [email or web address].

## 2022-09-16 NOTE — Progress Notes (Signed)
Progress Note  6 Days Post-Op  Subjective: Pt tolerating CLD with NGT clamped. Having liquid stool output. Denies significant abdominal pain or nausea. Wants to get home. Does report some SOB.  Objective: Vital signs in last 24 hours: Temp:  [97.4 F (36.3 C)-97.5 F (36.4 C)] 97.5 F (36.4 C) (03/28 0345) Pulse Rate:  [53-136] 91 (03/28 0810) Resp:  [13-25] 17 (03/28 0810) BP: (120-179)/(63-113) 166/91 (03/28 0600) SpO2:  [94 %-100 %] 98 % (03/28 0810) Last BM Date : 09/13/22  Intake/Output from previous day: 03/27 0701 - 03/28 0700 In: 1366.9 [I.V.:869.6; IV Piggyback:497.3] Out: 3105 [Urine:3025; Stool:80] Intake/Output this shift: No intake/output data recorded.  PE: General: pleasant, WD, WN male who is sitting up in NAD Lungs: Respiratory effort nonlabored Abd: soft, NT, ND, +BS, some leakage from ostomy bag - changed at bedside, no purulence noted today, VAC to midline  Psych: A&Ox3 with an appropriate affect.    Lab Results:  Recent Labs    09/14/22 0317 09/16/22 0546  WBC 7.4 7.3  HGB 9.4* 11.1*  HCT 29.5* 33.8*  PLT 286 349    BMET Recent Labs    09/15/22 0459 09/16/22 0546  NA 139 135  K 3.3* 3.6  CL 100 103  CO2 30 24  GLUCOSE 121* 137*  BUN 18 16  CREATININE 0.86 0.71  CALCIUM 8.1* 8.5*    PT/INR No results for input(s): "LABPROT", "INR" in the last 72 hours. CMP     Component Value Date/Time   NA 135 09/16/2022 0546   K 3.6 09/16/2022 0546   CL 103 09/16/2022 0546   CO2 24 09/16/2022 0546   GLUCOSE 137 (H) 09/16/2022 0546   BUN 16 09/16/2022 0546   CREATININE 0.71 09/16/2022 0546   CALCIUM 8.5 (L) 09/16/2022 0546   PROT 5.9 (L) 09/16/2022 0546   ALBUMIN 2.8 (L) 09/16/2022 0546   AST 36 09/16/2022 0546   ALT 27 09/16/2022 0546   ALKPHOS 91 09/16/2022 0546   BILITOT 0.9 09/16/2022 0546   GFRNONAA >60 09/16/2022 0546   GFRAA >60 02/06/2020 0436   Lipase     Component Value Date/Time   LIPASE 22 09/07/2022 1534        Studies/Results: No results found.  Anti-infectives: Anti-infectives (From admission, onward)    Start     Dose/Rate Route Frequency Ordered Stop   09/10/22 1600  piperacillin-tazobactam (ZOSYN) IVPB 3.375 g        3.375 g 12.5 mL/hr over 240 Minutes Intravenous Every 8 hours 09/10/22 1520 09/15/22 1929   09/10/22 0600  cefoTEtan (CEFOTAN) 2 g in sodium chloride 0.9 % 100 mL IVPB        2 g 200 mL/hr over 30 Minutes Intravenous On call to O.R. 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 Will ensure follow up and instructions in AVS  FEN: wean TPN, FLD VTE: SQH ID:  [email or web address] 3/22>3/27   LOS: 9 days     Norm Parcel, [facility] Surgery 09/16/2022, 8:33 AM Please see Amion for pager number during day hours 7:00am-4:30pm

## 2022-09-16 NOTE — Progress Notes (Addendum)
PHARMACY - TOTAL PARENTERAL NUTRITION CONSULT NOTE   Indication: Prolonged ileus  Patient Measurements: Height: 5\' 6"  (167.6 cm) Weight: 76 kg (167 lb 8 oz) IBW/kg (Calculated) : 63.8 TPN AdjBW (KG): 76 Body mass index is 27.04 kg/m.  Assessment: 72 year old male with rectal adenocarcinoma s/p colostomy. Intra-operatively, incidental finding of cecal perforation and underwent right colectomy with primary anastomosis. Patient with ileus, NGT placed 3/25. Pharmacy consulted to manage TPN.  Glucose / Insulin: no history of DM -CBGs < 150 Electrolytes: WNL Renal: stable Hepatic: LFTs WNL GI Imaging: -3/24 AXR concerning for mid to distal small bowel obstruction GI Surgeries / Procedures:  -3/22 loop colostomy placement, right colectomy  Central access: port placed 3/22 TPN start date: 3/26  Nutritional Goals: Goal TPN rate is 100 mL/hr (provides 108 g of protein and 2246 kcals per day)  RD Assessment: Estimated Needs Total Energy Estimated Needs: 2100-2300 kcals Total Protein Estimated Needs: 100-115 grams Total Fluid Estimated Needs: >/= 2.1L  Current Nutrition: CLD  Plan:  Per consult and discussion with Barkley Boards, PA - TPN to stop today. Patient tolerating CLD and likely plan to advance to solids later today.   TPN to decrease to 25 ml/hr at 1600 and then stop after 2 hours. Nursing order placed and plan discussed with RN.   Tawnya Crook, PharmD, BCPS Clinical Pharmacist 09/16/2022 9:01 AM

## 2022-09-17 ENCOUNTER — Inpatient Hospital Stay (HOSPITAL_COMMUNITY): Payer: No Typology Code available for payment source

## 2022-09-17 ENCOUNTER — Encounter (HOSPITAL_COMMUNITY): Payer: Self-pay | Admitting: Internal Medicine

## 2022-09-17 DIAGNOSIS — K56609 Unspecified intestinal obstruction, unspecified as to partial versus complete obstruction: Secondary | ICD-10-CM | POA: Diagnosis not present

## 2022-09-17 DIAGNOSIS — C189 Malignant neoplasm of colon, unspecified: Secondary | ICD-10-CM | POA: Diagnosis not present

## 2022-09-17 DIAGNOSIS — I4819 Other persistent atrial fibrillation: Secondary | ICD-10-CM | POA: Diagnosis not present

## 2022-09-17 LAB — GLUCOSE, CAPILLARY
Glucose-Capillary: 119 mg/dL — ABNORMAL HIGH (ref 70–99)
Glucose-Capillary: 125 mg/dL — ABNORMAL HIGH (ref 70–99)
Glucose-Capillary: 107 mg/dL — ABNORMAL HIGH (ref 70–99)
Glucose-Capillary: 113 mg/dL — ABNORMAL HIGH (ref 70–99)

## 2022-09-17 LAB — BASIC METABOLIC PANEL
Anion gap: 11 (ref 5–15)
BUN: 18 mg/dL (ref 8–23)
CO2: 23 mmol/L (ref 22–32)
Calcium: 8.3 mg/dL — ABNORMAL LOW (ref 8.9–10.3)
Chloride: 103 mmol/L (ref 98–111)
Creatinine, Ser: 0.68 mg/dL (ref 0.61–1.24)
GFR, Estimated: >60 mL/min (ref >60)
Glucose, Bld: 89 mg/dL (ref 70–99)
Potassium: 3.5 mmol/L (ref 3.5–5.1)
Sodium: 137 mmol/L (ref 135–145)

## 2022-09-17 LAB — AEROBIC/ANAEROBIC CULTURE W GRAM STAIN (SURGICAL/DEEP WOUND)

## 2022-09-17 LAB — CBC
HCT: 35.3 % — ABNORMAL LOW (ref 39.0–52.0)
Hemoglobin: 11.4 g/dL — ABNORMAL LOW (ref 13.0–17.0)
MCH: 30.1 pg (ref 26.0–34.0)
MCHC: 32.3 g/dL (ref 30.0–36.0)
MCV: 93.1 fL (ref 80.0–100.0)
Platelets: 413 10*3/uL — ABNORMAL HIGH (ref 150–400)
RBC: 3.79 MIL/uL — ABNORMAL LOW (ref 4.22–5.81)
RDW: 14.8 % (ref 11.5–15.5)
WBC: 9.5 10*3/uL (ref 4.0–10.5)
nRBC: 0 % (ref 0.0–0.2)

## 2022-09-17 MED ORDER — LABETALOL HCL 100 MG PO TABS
100.0000 mg | ORAL_TABLET | Freq: Three times a day (TID) | ORAL | Status: DC
  Administered 2022-09-17 (×2): 100 mg via ORAL
  Filled 2022-09-17 (×2): qty 1

## 2022-09-17 MED ORDER — SODIUM CHLORIDE (PF) 0.9 % IJ SOLN
INTRAMUSCULAR | Status: AC
  Filled 2022-09-17: qty 50

## 2022-09-17 MED ORDER — LABETALOL HCL 200 MG PO TABS
200.0000 mg | ORAL_TABLET | Freq: Three times a day (TID) | ORAL | Status: DC
  Administered 2022-09-17 – 2022-09-18 (×2): 200 mg via ORAL
  Filled 2022-09-17 (×2): qty 1

## 2022-09-17 MED ORDER — HYDROXYZINE HCL 25 MG PO TABS: 25.0000 mg | ORAL_TABLET | Freq: Three times a day (TID) | ORAL | Status: DC | PRN

## 2022-09-17 MED ORDER — IOHEXOL 300 MG/ML  SOLN
100.0000 mL | Freq: Once | INTRAMUSCULAR | Status: AC | PRN
  Administered 2022-09-17: 100 mL via INTRAVENOUS

## 2022-09-17 NOTE — Consult Note (Addendum)
[place] Nurse follow-up Consult Note: Surgical team has ordered Vac to be discontinued.  Removed and applied moist gauze dressing to midline full thickness abd wound.  Beefy red and shallow with minimal amt yellow drainage.  Demonstrated dressing change technique to wife at the bedside for use after discharge and she verbalized understanding. Topical treatment orders provided for bedside nurses to perform as follows:  apply moist gauze dressing to abd Q day, then cover with ABD pad and tape.  [place] Nurse ostomy consult note Wife was able to perform as steps to change the ostomy independently.  Stomal assessment/size: Stoma is red and viable, 1 1/2 inches, slightly above skin level, located in a crease at 3:00 o'clock and 9:00 o'clock. There is a small amt tan pus draining at 3:00 from the edge of the stoma from a pinhole opening. Peristomal assessment: intact skin surrounding Output: 50cc liquid brown stool Ostomy pouching: Wife was able to stretch barrier ring to fit and apply to the back of the pouch and apply pouch to stoma. She is able to open and close velcro to empty.  Pt watched the procedure but did not participate. Educational material left at the bedside. 5 sets of supplies ordered to the bedside; use barrier rings, Kellie Simmering # [phone] and one piece flexible pouches Kellie Simmering # [id number].  Reviewed pouching routines and ordering supplies and dietary precautions and importance of avoiding dehydration.  Enrolled patient in [place] program: Yes, previously Thank-you,  [name] MSN, [place], [place], [place], [place]

## 2022-09-17 NOTE — Progress Notes (Signed)
Physical Therapy Treatment Patient Details Name: Scott [name] MRN: [id number] DOB: 1951/03/02 Today's Date: 09/17/2022   History of Present Illness Patient is a 72 year old who presented with abdominal distension. In the ER, CT showed suspected distal colonic obstruction secondary rectal mass. 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 3/25 patient imaging shown ileus with delirium. PMH: perm AF,hx aortic aneurysm s/p stent, CKD IIIa baseline 1.2 and recently diagnosed colon CA, dCHF, and HTN    PT Comments    Pt seen in ICU room # 1227 General Comments: AxO x 3 very pleasant man from [place] with wife Otho Ket present.  Max c/o feelingt "exhausted".  "It's been a long two weeks". Assisted OOB to recliner only.  General transfer comment: self able to rise with NO AD needed.  Assist for multiple lines/leads.  Transfer only this session due to Hypertension as well as MAP >100.  Mild c/o dizziness with standing.  Alerted RN. Supine        BP 144/99(115)  HR 95 RA 98% EOB            BP 172/104(126) HR 113 RA 100% Standing     BP 189/109(138) HR 109 RA 100%  mild c/o dizziness "I need to sit a minute" Did not amb pt due to elevated BP and MAP >100.  Reported to RN Pt plans to return home with Spouse when medically stable.    Recommendations for follow up therapy are one component of a multi-disciplinary discharge planning process, led by the attending physician.  Recommendations may be updated based on patient status, additional functional criteria and insurance authorization.  Follow Up Recommendations       Assistance Recommended at Discharge Set up Supervision/Assistance  Patient can return home with the following A little help with bathing/dressing/bathroom;Assistance with cooking/housework;Assist for transportation   Equipment Recommendations  None recommended by PT    Recommendations for Other Services        Precautions / Restrictions Precautions Precautions: Fall Precaution Comments: recent LAP with colostomy Restrictions Weight Bearing Restrictions: No     Mobility  Bed Mobility Overal bed mobility: Needs Assistance Bed Mobility: Supine to Sit     Supine to sit: Supervision, Min guard     General bed mobility comments: self able with assist for multi lines    Transfers Overall transfer level: Needs assistance Equipment used: 1 person hand held assist   Sit to Stand: Supervision, Min guard           General transfer comment: self able to rise with NO AD needed.  Assist for multiple lines/leads.  Transfer only this session due to Hypertension as well as MAP >100.  Mild c/o dizziness with standing.  Alerted RN.    Ambulation/Gait               General Gait Details: Unable to attempt amb due to Hypertension 189/109 as well as MAP 138.  RN called to room.  Assisted OOB to recliner only this event.   Stairs             Wheelchair Mobility    Modified Rankin (Stroke Patients Only)       Balance                                            Cognition  Arousal/Alertness: Awake/alert Behavior During Therapy: WFL for tasks assessed/performed                                   General Comments: AxO x 3 very pleasant man from [place] with wife Otho Ket present.  Max c/o feelingt "exhausted".  "It's been a long two weeks".        Exercises      General Comments        Pertinent Vitals/Pain Pain Assessment Pain Assessment: Faces Faces Pain Scale: Hurts a little bit Pain Location: ABD    Home Living                          Prior Function            PT Goals (current goals can now be found in the care plan section) Progress towards PT goals: Progressing toward goals    Frequency    Min 3X/week      PT Plan Current plan remains appropriate    Co-evaluation              AM-PAC PT  "6 Clicks" Mobility   Outcome Measure  Help needed turning from your back to your side while in a flat bed without using bedrails?: A Little Help needed moving from lying on your back to sitting on the side of a flat bed without using bedrails?: A Little Help needed moving to and from a bed to a chair (including a wheelchair)?: A Little Help needed standing up from a chair using your arms (e.g., wheelchair or bedside chair)?: A Little Help needed to walk in hospital room?: A Little Help needed climbing 3-5 steps with a railing? : A Lot 6 Click Score: 17    End of Session Equipment Utilized During Treatment: Gait belt Activity Tolerance: Other (comment) (elevated BP and MAP with activity) Patient left: in chair;with call bell/phone within reach;with family/visitor present Nurse Communication: Mobility status PT Visit Diagnosis: Unsteadiness on feet (R26.81);Difficulty in walking, not elsewhere classified (R26.2)     Time: 1000-1020 PT Time Calculation (min) (ACUTE ONLY): 20 min  Charges:  $Therapeutic Activity: 8-22 mins                     Rica Koyanagi  PTA Acute  Rehabilitation Services Office M-F          [phone]

## 2022-09-17 NOTE — Progress Notes (Signed)
7 Days Post-Op   Subjective/Chief Complaint: PT doing well Awaiting CT to eval for SQ abscess near ostomy   Objective: Vital signs in last 24 hours: Temp:  [97.6 F (36.4 C)-98.9 F (37.2 C)] 97.6 F (36.4 C) (03/29 0740) Pulse Rate:  [27-163] 51 (03/29 0500) Resp:  [14-28] 19 (03/29 0600) BP: (130-166)/(65-104) 159/104 (03/29 0500) SpO2:  [95 %-100 %] 98 % (03/29 0500) Last BM Date : 09/16/22  Intake/Output from previous day: 03/28 0701 - 03/29 0700 In: 886.6 [I.V.:886.6] Out: 800 [Urine:600; Stool:200] Intake/Output this shift: Total I/O In: -  Out: 250 [Urine:250]  PE:  Constitutional: No acute distress, conversant, appears states age. Eyes: Anicteric sclerae, moist conjunctiva, no lid lag Lungs: Clear to auscultation bilaterally, normal respiratory effort CV: regular rate and rhythm, no murmurs, no peripheral edema, pedal pulses 2+ GI: Soft, no masses or hepatosplenomegaly, non-tender to palpation, vac in place, ostomy patent Skin: No rashes, palpation reveals normal turgor Psychiatric: appropriate judgment and insight, oriented to person, place, and time   Lab Results:  Recent Labs    09/16/22 0546  WBC 7.3  HGB 11.1*  HCT 33.8*  PLT 349   BMET Recent Labs    09/15/22 0459 09/16/22 0546  NA 139 135  K 3.3* 3.6  CL 100 103  CO2 30 24  GLUCOSE 121* 137*  BUN 18 16  CREATININE 0.86 0.71  CALCIUM 8.1* 8.5*  Anti-infectives: Anti-infectives (From admission, onward)    Start     Dose/Rate Route Frequency Ordered Stop   09/10/22 1600  piperacillin-tazobactam (ZOSYN) IVPB 3.375 g        3.375 g 12.5 mL/hr over 240 Minutes Intravenous Every 8 hours 09/10/22 1520 09/15/22 1929   09/10/22 0600  cefoTEtan (CEFOTAN) 2 g in sodium chloride 0.9 % 100 mL IVPB        2 g 200 mL/hr over 30 Minutes Intravenous On call to O.R. 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

## 2022-09-17 NOTE — Plan of Care (Signed)
  Problem: Activity: Goal: Risk for activity intolerance will decrease Outcome: Progressing   Problem: Nutrition: Goal: Adequate nutrition will be maintained Outcome: Progressing   Problem: Coping: Goal: Level of anxiety will decrease Outcome: Progressing   Problem: Elimination: Goal: Will not experience complications related to urinary retention Outcome: Progressing   Problem: Pain Managment: Goal: General experience of comfort will improve Outcome: Progressing   Problem: Safety: Goal: Ability to remain free from injury will improve Outcome: Progressing   Problem: Education: Goal: Knowledge of General Education information will improve Description: Including pain rating scale, medication(s)/side effects and non-pharmacologic comfort measures Outcome: Completed/Met   Problem: Clinical Measurements: Goal: Will remain free from infection Outcome: Completed/Met Goal: Diagnostic test results will improve Outcome: Completed/Met Goal: Respiratory complications will improve Outcome: Completed/Met Goal: Cardiovascular complication will be avoided Outcome: Completed/Met

## 2022-09-17 NOTE — Progress Notes (Signed)
Progress Note   Patient: Scott [name] [id number] DOB: [birth year] DOA: 09/07/2022     10 DOS: the patient was seen and examined on 09/17/2022   Brief hospital course: Scott [name] is a 72 y.o. 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  In the ER, CT showed suspected distal colonic obstruction secondary to 2.7 cm mid/distal rectal mass.  3/19: Admitted, Gen Surg consulted, felt he had failed nonoperative management 3/20: GI consulted, unable to stent 3/21: Gen Surg planning for surgery tomorrow 3/22: Underwent Colostomy with Dr. Marcello Moores; intra-operatively also found to have cecal perforation incidentally, and underwent right colectomy with primary anastomosis; developed hives post-op 3/23: NG and foley out, doing well 3/24: Into afib with RVR, transferred to SDU 3/25: Delirious, AXR shows ileus 3/26: Delirium resolved, TPN started 3/28: NGT out, diet advanced, weaning TPN  Assessment and Plan: * Large bowel obstruction ([place]) PMH Colon cancer ([place]) Admitted and Gen Surg/GI consulted.  Unfortunately, not amenable to stenting, so went to OR for colostomy 3/22 Continue post op care, wound vac per Gen Surg; surgery ordered CT to evaluate purulent drainage Tolerating diet.  General surgery has ordered CT to assess purulence. Follow up with Surgery and Oncology after discharge   Perforation of cecum Incidental and unexpected finding intra-operatively. Underwent right colectomy with primary anastomosis (in addition to colostomy) Completed Zosyn    Ileus ([place]) TPN weaned.  Tolerating diet.   Persistent atrial fibrillation ([name]) Afib with RVR Has been off Eliquis since start of month when he self-held due to rectal bleeding from cancer.    Restart home labetalol.  Resume anticoagulation if no operative intervention planned.   Holding home  labetalol/holding Eliquis for now, resume when cleared by Gen Surg, at present do not feel compelling need for anticoagulation, will hold heparin   Delirium due to multiple etiologies, acute, hyperactive ICU delirium Resolved. Standard delirium precautions. Avoid benzos   Hives Resolved. Brief, noted incidentally post-op.  Possibly due to cefotetan unclear.     Normocytic anemia Stable. Hgb stable ~9-10, no clinical bleeding   PVD (peripheral vascular disease) ([place]) Type B aortic dissection s/p endovascular repair in 2021   Elevated LFTs Resolved   Stage II chronic kidney disease (CKD IIIa considered, but appears to be II based on review). Cr stable relative to baseline, acute kidney injury ruled out   Hypokalemia Per TPN pharamcy   Essential hypertension BP stable.  Can resume Entresto and spironolactone once on diet.  Home when cleared by general surgery.      Subjective:  Feels fine, no complaints, wants to go home.  Physical Exam: Vitals:   09/17/22 0740 09/17/22 1120 09/17/22 1123 09/17/22 1227  BP:   (!) 151/92   Pulse:   90 (!) 149  Resp:      Temp: 97.6 F (36.4 C) (!) 97.4 F (36.3 C)    TempSrc: Oral Oral    SpO2:      Weight:      Height:       Physical Exam Vitals reviewed.  Constitutional:      General: He is not in acute distress.    Appearance: He is not ill-appearing or toxic-appearing.  Cardiovascular:     Rate and Rhythm: Rhythm irregular.     Heart sounds: No murmur heard.    Comments: Telemetry afib, rate controlled Pulmonary:  Effort: Pulmonary effort is normal. No respiratory distress.     Breath sounds: No wheezing, rhonchi or rales.  Neurological:     Mental Status: He is alert.  Psychiatric:        Mood and Affect: Mood normal.        Behavior: Behavior normal.     Data Reviewed: BMP noted Hgb stable 11.4  Family Communication: wife at bedisde  Disposition: Status is: Inpatient Remains inpatient appropriate  because: s/p surgeyr  Planned Discharge Destination: Home    Time spent: 20 minutes  Author: Murray Hodgkins, MD 09/17/2022 2:54 PM  For on call review [email or web address].

## 2022-09-17 NOTE — Progress Notes (Signed)
Occupational Therapy Treatment Patient Details Name: Scott [name] MRN: [id number] DOB: 24-Aug-1950 Today's Date: 09/17/2022   History of present illness Patient is a 72 year old who presented with abdominal distension. In the ER, CT showed suspected distal colonic obstruction secondary rectal mass. 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 3/25 patient imaging shown ileus with delirium. PMH: perm AF,hx aortic aneurysm s/p stent, CKD IIIa baseline 1.2 and recently diagnosed colon CA, dCHF, and HTN   OT comments  Patient progressing slowly with short, modified ADL session for OT due to pt already fatigued from sitting up in recliner with start 0-10 Rate of Perceived Exertion Scale score of 6.5/10 and post-activity RPE of 8/10 after brief standing for oral hygiene at table without ambulation.  Pt  showed improved standing balance with Min guard assist with unilateral UE support, and ability to stand from recliner with supervision.   Patient remains limited by intermittent pain but pain-free this session,  generalized weakness and decreased activity tolerance along with deficits noted below. Pt continues to demonstrate good rehab potential and would benefit from continued skilled OT to increase safety and independence with ADLs and functional transfers to allow pt to return home safely and reduce caregiver burden and fall risk.    Recommendations for follow up therapy are one component of a multi-disciplinary discharge planning process, led by the attending physician.  Recommendations may be updated based on patient status, additional functional criteria and insurance authorization.    Assistance Recommended at Discharge Intermittent Supervision/Assistance  Patient can return home with the following  Assistance with cooking/housework;Direct supervision/assist for medications management;Assist for transportation;Direct  supervision/assist for financial management;Help with stairs or ramp for entrance;A little help with bathing/dressing/bathroom;A little help with walking and/or transfers   Equipment Recommendations  None recommended by OT    Recommendations for Other Services      Precautions / Restrictions Precautions Precautions: Fall Precaution Comments: recent LAP with colostomy Restrictions Weight Bearing Restrictions: No       Mobility Bed Mobility                    Transfers                         Balance Overall balance assessment: Needs assistance Sitting-balance support: Single extremity supported, Feet supported   Sitting balance - Comments: NT as pt in recliner   Standing balance support: Single extremity supported, During functional activity Standing balance-Leahy Scale: Fair                             ADL either performed or assessed with clinical judgement   ADL Overall ADL's : Needs assistance/impaired   Eating/Feeding Details (indicate cue type and reason): NPO except sips with meds for CT scan per pt. Grooming: Oral care Grooming Details (indicate cue type and reason): NG tube now removed. Pt stated that he was too fatigued to ambulate to sink for grooming but agreed to stand at table top with recliner behind him in case pt urgently required rest.  Pt tolerated 1 grooming task in standing with Min guard to close supervison and then stataed that he was too fatigued to continue standing ADLs.  Per HR increase, pt was assisted back to seated position in recliner.  Pt reported RPE of about 6.5 prior to activity and RPE increase to 8/10 after  activity.                   Toilet Transfer Details (indicate cue type and reason): Pt stood from recliner with supervision. Did not ambulate or transfer to commode.         Functional mobility during ADLs: Supervision/safety;Min guard      Extremity/Trunk Assessment Upper Extremity  Assessment Upper Extremity Assessment: Overall WFL for tasks assessed   Lower Extremity Assessment Lower Extremity Assessment: Overall WFL for tasks assessed   Cervical / Trunk Assessment Cervical / Trunk Assessment: Normal    Vision Baseline Vision/History: 1 Wears glasses Vision Assessment?: No apparent visual deficits   Perception     Praxis      Cognition Arousal/Alertness: Awake/alert Behavior During Therapy: WFL for tasks assessed/performed Overall Cognitive Status: Within Functional Limits for tasks assessed                                          Exercises Other Exercises Other Exercises: Pt provided with handouts on energy conservation and educated with wife persent on The 4 P's with OT providing examples to implement with daily activities. Pt engaged in discussion and verbalized understanding.    Shoulder Instructions       General Comments      Pertinent Vitals/ Pain       Pain Assessment Pain Assessment: No/denies pain  Home Living                                          Prior Functioning/Environment              Frequency  Min 2X/week        Progress Toward Goals  OT Goals(current goals can now be found in the care plan section)  Progress towards OT goals: Progressing toward goals  Acute Rehab OT Goals Patient Stated Goal: Build energy OT Goal Formulation: With patient/family Time For Goal Achievement: 09/29/22 Potential to Achieve Goals: Good  Plan Discharge plan remains appropriate    Co-evaluation                 AM-PAC OT "6 Clicks" Daily Activity     Outcome Measure   Help from another person eating meals?: None (NPO, but shows intact UE function and cognition needed for self feeding) Help from another person taking care of personal grooming?: A Little Help from another person toileting, which includes using toliet, bedpan, or urinal?: A Lot Help from another person bathing  (including washing, rinsing, drying)?: A Lot Help from another person to put on and taking off regular upper body clothing?: A Little Help from another person to put on and taking off regular lower body clothing?: A Lot 6 Click Score: 16    End of Session Equipment Utilized During Treatment: Gait belt  OT Visit Diagnosis: Unsteadiness on feet (R26.81);Pain   Activity Tolerance Patient limited by fatigue (HR increase to 149)   Patient Left     Nurse Communication Other (comment) (RN, Mark cleared OT to see pt. Mark informed of beeping IV at end of OT)        Time: [id number] OT Time Calculation (min): 19 min  Charges: OT General Charges $OT Visit: 1 Visit OT Treatments $Self Care/Home Management : 8-22 mins  Anderson Malta,  OT Acute Rehab Services Office: [phone] 09/17/2022  [name] 09/17/2022, 12:37 PM

## 2022-09-18 DIAGNOSIS — K56609 Unspecified intestinal obstruction, unspecified as to partial versus complete obstruction: Secondary | ICD-10-CM | POA: Diagnosis not present

## 2022-09-18 DIAGNOSIS — K3532 Acute appendicitis with perforation and localized peritonitis, without abscess: Secondary | ICD-10-CM | POA: Diagnosis not present

## 2022-09-18 DIAGNOSIS — C189 Malignant neoplasm of colon, unspecified: Secondary | ICD-10-CM | POA: Diagnosis not present

## 2022-09-18 DIAGNOSIS — I4819 Other persistent atrial fibrillation: Secondary | ICD-10-CM | POA: Diagnosis not present

## 2022-09-18 LAB — GLUCOSE, CAPILLARY
Glucose-Capillary: 111 mg/dL — ABNORMAL HIGH (ref 70–99)
Glucose-Capillary: 112 mg/dL — ABNORMAL HIGH (ref 70–99)
Glucose-Capillary: 135 mg/dL — ABNORMAL HIGH (ref 70–99)

## 2022-09-18 MED ORDER — HYDROXYZINE HCL 25 MG PO TABS: 25.0000 mg | ORAL_TABLET | Freq: Two times a day (BID) | ORAL | 0 refills | Status: DC | PRN

## 2022-09-18 MED ORDER — HEPARIN SOD (PORK) LOCK FLUSH 100 UNIT/ML IV SOLN: 500.0000 [IU] | INTRAVENOUS | Status: DC | PRN

## 2022-09-18 NOTE — Discharge Summary (Signed)
Physician Discharge Summary   Patient: Scott [name] MRN: [id number] DOB: [birth year]  Admit date:     09/07/2022  Discharge date: 09/18/22  Discharge Physician: Scott [name]   PCP: [facility], [name]   Recommendations at discharge:  Follow-up surgery as below  Follow-up atrial fibrillation heart rate and blood pressure control  Discharge Diagnoses: Principal Problem:   Large bowel obstruction ([name]) Active Problems:   Perforation of cecum   Ileus ([name])   Persistent atrial fibrillation ([name])   Delirium due to multiple etiologies, acute, hyperactive   Essential hypertension   Hypokalemia   Stage 3a chronic kidney disease (CKD) ([name])   Elevated LFTs   PVD (peripheral vascular disease) ([name])   Colon cancer ([name])   Normocytic anemia   Hives  Resolved Problems:   * No resolved hospital problems. [facility] Course: [name] is a 71 y.o. 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  In the ER, CT showed suspected distal colonic obstruction secondary to 2.7 cm mid/distal rectal mass.  3/19: Admitted, Gen Surg consulted, felt he had failed nonoperative management 3/20: GI consulted, unable to stent 3/21: Gen Surg planning for surgery tomorrow 3/22: Underwent Colostomy with Dr. Marcello [name]; intra-operatively also found to have cecal perforation incidentally, and underwent right colectomy with primary anastomosis; developed hives post-op 3/23: NG and foley out, doing well 3/24: Into afib with RVR, transferred to SDU 3/25: Delirious, AXR shows ileus 3/26: Delirium resolved, TPN started 3/28: NGT out, diet advanced, weaning TPN 3/30: Tolerating diet, home today  * Large bowel obstruction [facility]) PMH Colon cancer [facility]) Admitted and Gen Surg/GI consulted.  Unfortunately, not amenable to stenting, so went to OR for colostomy 3/22 Continue post  op care, wound vac per Gen Surg; surgery ordered CT to evaluate purulent drainage Tolerating diet.  Follow-up CT was reassuring.  Cleared for discharge by general surgery. Follow up with Surgery and Oncology after discharge   Perforation of cecum Incidental and unexpected finding intra-operatively. Underwent right colectomy with primary anastomosis (in addition to colostomy) Completed Zosyn    Ileus ([place]) TPN weaned.  Tolerating diet.   Persistent atrial fibrillation ([name]) Afib with RVR Has been off Eliquis since start of month when he self-held due to rectal bleeding from cancer.    Restarted home labetalol.  Resume anticoagulation on discharge.   Delirium due to multiple etiologies, acute, hyperactive ICU delirium Resolved. Standard delirium precautions    Hives Resolved. Brief, noted incidentally post-op.  Possibly due to cefotetan, unclear.     Normocytic anemia Stable. 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 Creatinine stable relative to baseline, acute kidney injury ruled out   Hypokalemia Per TPN pharamcy   Essential hypertension BP stable.  Can resume Entresto and spironolactone once on diet.     Consultants General surgery GI Oncology   Procedures LAPAROSCOPIC ASSISTED LOOP COLOSTOMY PLACEMENT, RIGHT COLECTOMY INSERTION PORT-A-CATH   Disposition: Home Diet recommendation:  Regular diet DISCHARGE MEDICATION: Allergies as of 09/18/2022       Reactions   Amlodipine Itching   Carvedilol Other (See Comments)   Night terrors   Hydralazine Other (See Comments)   Night terrors,  fatigued   Valsartan Other (See Comments)   Night terrors and fatigued   Cefotetan Hives   Possible hives, but not clear        Medication List     TAKE  these medications    empagliflozin 25 MG Tabs tablet Commonly known as: JARDIANCE Take 0.5 tablets by mouth every morning.   furosemide 40 MG tablet Commonly known as: LASIX Take 40 mg by mouth daily as needed for edema or fluid.   hydrOXYzine 25 MG tablet Commonly known as: ATARAX Take 1 tablet (25 mg total) by mouth 2 (two) times daily as needed for anxiety.   labetalol 100 MG tablet Commonly known as: NORMODYNE Take 1 tablet (100 mg total) by mouth 3 (three) times daily with meals.   NON FORMULARY Take 1 tablet by mouth daily as needed (Constipation). Laxative. Unknown name.   oxyCODONE 5 MG immediate release tablet Commonly known as: Oxy IR/ROXICODONE Take 1-2 tablets (5-10 mg total) by mouth every 4 (four) hours as needed for severe pain.   polyethylene glycol 17 g packet Commonly known as: MIRALAX / GLYCOLAX Take 17 g by mouth daily as needed for mild constipation or moderate constipation.   sacubitril-valsartan 24-26 MG Commonly known as: ENTRESTO Take 0.5 tablets by mouth daily.   spironolactone 25 MG tablet Commonly known as: ALDACTONE Take 0.5 tablets by mouth daily.               Discharge Care Instructions  (From admission, onward)           Start     Ordered   09/18/22 0000  Discharge wound care:       Comments: Apply moist gauze dressing to abd Q day, then cover with ABD pad and tape   09/18/22 [place]. Follow up.   Why: A representative with [facility] will contact you within 24-48 hours from your discharge from the hospital regarding your home health RN services. Contact information: [place] [address] [phone]         Leighton Ruff, MD. Go on [id number].   Specialties: General Surgery, Colon and Rectal Surgery Why: 11:50 AM, please arrive 30 min prior to appointment time to complete check in. Contact information: [place] [address] [phone]         [facility] Schedule an appointment as soon as possible for a visit in 1 week(s).   Contact information: [place] [address] [phone]                Feels good Tolerating diet  Discharge Exam: Filed Weights   09/07/22 1650 09/10/22 0937  Weight: 76 kg 76 kg   Physical Exam Vitals reviewed.  Constitutional:      General: He is not in acute distress.    Appearance: He is not ill-appearing or toxic-appearing.  Cardiovascular:     Rate and Rhythm: Rhythm irregular.     Pulses: Normal pulses.     Heart sounds: No murmur heard. Pulmonary:     Effort: Pulmonary effort is normal. No respiratory distress.     Breath sounds: No wheezing, rhonchi or rales.  Neurological:     Mental Status: He is alert.  Psychiatric:        Mood and Affect: Mood normal.  Behavior: Behavior normal.      Condition at discharge: good  The results of significant diagnostics from this hospitalization (including imaging, microbiology, ancillary and laboratory) are listed below for reference.   Imaging Studies: CT ABDOMEN PELVIS W CONTRAST  Result Date: 09/17/2022 CLINICAL DATA:  Postoperative abdominal pain, purulent drainage around stoma EXAM: CT ABDOMEN AND PELVIS WITH CONTRAST TECHNIQUE: Multidetector CT imaging of the abdomen and pelvis was performed using the standard protocol following bolus administration of intravenous contrast. RADIATION DOSE REDUCTION: This exam was performed according to the departmental dose-optimization program which includes automated exposure control, adjustment of the mA and/or kV according to patient size and/or use of iterative reconstruction technique. CONTRAST:  121mL OMNIPAQUE IOHEXOL 300 MG/ML  SOLN COMPARISON:  09/07/2022 FINDINGS: Lower chest: Trace left pleural effusion and minimal left basilar atelectasis. Distal extent of a  thoracic aortic stent again noted. Hepatobiliary: Stable hepatic cysts. No intrahepatic biliary duct dilation. The gallbladder is unremarkable. No biliary duct dilation. Pancreas: Unremarkable. No pancreatic ductal dilatation or surrounding inflammatory changes. Spleen: Normal in size without focal abnormality. Adrenals/Urinary Tract: Stable appearance of the bilateral kidneys. No urinary tract calculi or obstruction. The adrenals and bladder are unremarkable. Stomach/Bowel: Postsurgical changes are seen from loop colostomy left lower quadrant. Interval resection of the cecum, with ileocolic anastomosis within the right mid abdomen. The irregular enhancing rectal mass seen on prior study is again noted and unchanged, consistent with neoplasm. There is diffuse wall thickening of the distal sigmoid colon and rectum beyond the loop colostomy to the level of the obstructing rectal mass. There is mild diffuse small bowel dilation with scattered gas fluid levels, favor postoperative ileus. No evidence of high-grade small bowel obstruction. Vascular/Lymphatic: Aortic atherosclerosis. No enlarged abdominal or pelvic lymph nodes. Reproductive: Prostate is unremarkable. Other: Trace free fluid within the left upper quadrant and left hemipelvis. No free intraperitoneal gas. Postsurgical changes from midline laparotomy with packing material at the surgical site. No abdominal wall hernia. Musculoskeletal: No acute or destructive bony lesions. Reconstructed images demonstrate no additional findings. IMPRESSION: 1. Postsurgical changes from interval left lower quadrant loop colostomy, cecum resection, and ileocolic anastomosis as above. 2. Segmental wall thickening of the colon between the loop colostomy and obstructing rectal mass, consistent with underlying inflammatory, infectious, or ischemic colitis. 3. Stable irregular enhancing rectal mass consistent with neoplasm. 4. Mild small bowel distension with scattered gas fluid  levels most consistent with postoperative ileus. No evidence of high-grade obstruction. 5. Trace left pleural effusion and trace ascites. 6.  Aortic Atherosclerosis (ICD10-I70.0). Electronically Signed   By: Randa Ngo M.D.   On: 09/17/2022 15:13   DG Abd 1 View  Result Date: 09/13/2022 CLINICAL DATA:  Check gastric catheter placement EXAM: ABDOMEN - 1 VIEW COMPARISON:  None Available. FINDINGS: Gastric catheter is noted within the stomach. Descending aortic stent graft is seen. Scattered large and small bowel gas is noted. Mild persistent small bowel dilatation is seen. No free air is noted. IMPRESSION: Gastric catheter within the stomach. Electronically Signed   By: Inez Catalina M.D.   On: 09/13/2022 19:22   DG Abd 1 View  Result Date: 09/13/2022 CLINICAL DATA:  NG tube placement EXAM: ABDOMEN - 1 VIEW COMPARISON:  Previous studies including the examination of 09/12/2022 FINDINGS: Tip of NG tube is seen in the region of fundus of the stomach. Side port in the NG tube is not distinctly visualized. There is presence of gas in slightly dilated small bowel loops. There is previous endovascular stent  repair in thoracic aorta. IMPRESSION: Tip of NG tube is seen in the fundus of the stomach. Electronically Signed   By: Elmer Picker M.D.   On: 09/13/2022 12:41   DG Abd Portable 1V  Result Date: 09/12/2022 CLINICAL DATA:  [id number].  Abdominal distention. EXAM: PORTABLE ABDOMEN - 1 VIEW COMPARISON:  CT with IV contrast 09/07/2022, abdomen film 08/21/2022 FINDINGS: Interval decompression of the large bowel since the prior study with interval increased small bowel dilatation. There is small bowel dilatation in the upper to mid abdomen up to 4.5 cm, previously 3 cm. There are decompressed small bowel segments in the right lower quadrant and upper pelvis. Findings concerning for mid to distal small bowel obstruction. There is no supine evidence of free air. No pathologic calcification or other significant  finding is seen. Degenerative change lumbar spine. IMPRESSION: Interval decompression of the large bowel since the prior study with interval increased small bowel dilatation. Findings concerning for mid to distal small bowel obstruction. Electronically Signed   By: Telford Nab M.D.   On: 09/12/2022 22:06   DG CHEST PORT 1 VIEW  Result Date: 09/12/2022 CLINICAL DATA:  Shortness of breath. EXAM: PORTABLE CHEST 1 VIEW COMPARISON:  09/11/2022 FINDINGS: Single-view of the chest again demonstrates a prominent cardiac silhouette which may be accentuated by slightly low lung volumes and the AP portable technique. Patient is also rotated towards the left on this examination. No focal lung densities are identified. Patient has an aortic stent graft involving the descending thoracic aorta. Stable position of the right jugular Port-A-Cath with the tip near the upper SVC region. Negative for pneumothorax. IMPRESSION: 1. Low lung volumes without focal disease. 2. Cardiac silhouette is prominent but probably accentuated by the low lung volumes and technique. Electronically Signed   By: Markus Daft M.D.   On: 09/12/2022 09:36   DG CHEST PORT 1 VIEW  Result Date: 09/11/2022 CLINICAL DATA:  Port-A-Cath placement EXAM: PORTABLE CHEST 1 VIEW COMPARISON:  04/11/2022 FINDINGS: Cardiac silhouette is grossly enlarged. Lungs are clear. Normal pulmonary vasculature. No pneumothorax. No pleural effusion. Descending thoracic aortic stent. Right-sided Port-A-Cath tip superimposed with the proximal SVC. IMPRESSION: Enlarged cardiac silhouette. Electronically Signed   By: Sammie Bench M.D.   On: 09/11/2022 10:10   DG C-Arm 1-60 Min-No Report  Result Date: 09/10/2022 Fluoroscopy was utilized by the requesting physician.  No radiographic interpretation.   CT ABDOMEN PELVIS W CONTRAST  Result Date: 09/07/2022 CLINICAL DATA:  Vomiting, possible bowel obstruction EXAM: CT ABDOMEN AND PELVIS WITH CONTRAST TECHNIQUE: Multidetector  CT imaging of the abdomen and pelvis was performed using the standard protocol following bolus administration of intravenous contrast. RADIATION DOSE REDUCTION: This exam was performed according to the departmental dose-optimization program which includes automated exposure control, adjustment of the mA and/or kV according to patient size and/or use of iterative reconstruction technique. CONTRAST:  158mL OMNIPAQUE IOHEXOL 300 MG/ML  SOLN COMPARISON:  08/18/2022 FINDINGS: Lower chest: Descending thoracic stent graft partially visualized, stable in appearance. No pleural or pericardial effusion. Lung bases clear. Hepatobiliary: Gallbladder physiologically distended without wall thickening or regional inflammatory change. Stable hepatic cyst segment 4A. No new liver lesion or biliary ductal dilatation. Pancreas: Parenchymal atrophy without mass or ductal dilatation. No regional inflammatory change. Spleen: Normal in size without focal abnormality. Adrenals/Urinary Tract: No adrenal mass. 2.6 cm 21 HU exophytic mid right renal cyst; no follow-up warranted. Symmetric scratch the no hydronephrosis or urolithiasis. Urinary bladder physiologically distended. Stomach/Bowel: Stomach is decompressed. Duodenum and proximal  small bowel decompressed. Mid and distal small bowel is dilated with gas and fluid. There is marked dilatation of the cecum up to 10.1 cm, and gaseous dilatation of the remainder of the colon through the sigmoid segment. There transition to a constricted somewhat irregular thick-walled enhancing segment in the distal mid/distal rectum over length of at least 2.7 cm, with irregular peripheral margins extending into perirectal fat posterolaterally on the right. Vascular/Lymphatic: Moderate aortoiliac calcified atheromatous plaque. Portal vein patent. No definite mesenteric, pelvic, or retroperitoneal adenopathy. Reproductive: Prostate is unremarkable. Other: Small volume pelvic and right upper abdominal  ascites. No free air. Musculoskeletal: Multilevel lumbar spondylitic change. No fracture or worrisome lesion. IMPRESSION: 1. Suspected distal colonic obstruction secondary to 2.7 cm mid/distal rectal mass. Correlate with physical exam. 2. No regional adenopathy or evidence of distal metastatic disease. 3. Small volume pelvic and right upper abdominal ascites. 4.  Aortic Atherosclerosis (ICD10-I70.0). Electronically Signed   By: Lucrezia Europe M.D.   On: 09/07/2022 17:49   MR PELVIS WO CM RECTAL CA STAGING  Result Date: 08/26/2022 CLINICAL DATA:  Rectal cancer staging is evaluation. EXAM: MRI PELVIS WITHOUT CONTRAST TECHNIQUE: Multiplanar multisequence MR imaging of the pelvis was performed. No intravenous contrast was administered. Ultrasound gel was administered per rectum to optimize tumor evaluation. COMPARISON:  CT angiography from October of 2023 and February of 2024. FINDINGS: TUMOR LOCATION Tumor distance from Anal Verge/Skin surface: 7 cm from the anal verge to bulky tumor in the mid to high rectum. Eccentric rectal wall thickening extends to approximately 1 cm above the internal sphincter as outlined below along the LEFT lateral and posterolateral rectum. Tumor distance to Internal Anal sphincter: Bulky tumor which is situated approximately 4.5 cm from the upper margin of the sphincter complex. Eccentric rectal thickening extends along the LEFT lateral rectal wall to approximately 1 cm above the sphincter complex best seen in coronal plane TUMOR DESCRIPTION Circumferential extent: Concentric at the level of the mid to high rectum with gross extra rectal extension beyond the muscularis and into the perirectal fat. Tumor obstructs the rectum at this level. Eccentric plaque like soft tissue extends along the LEFT lateral rectal wall, potentially extending to just above the sphincter complex as described above. Tumor Size and volume: Bulk of tumor measuring approximately 4.5 x 3.2 x 3.5 (volume = 26 cc) cm  excluding the plaque-like thickening that extends along the LEFT lateral rectal wall. T - CATEGORY Extension through Muscularis Propria: Yes, tumor extending along the LEFT lateral rectum (image 31/8) bulky gross tumor invasion beyond the muscularis abuts and involves pelvic floor musculature on image 31/8 extending approximally 2.2 cm beyond the confines of the low to mid rectum. Pilar Plate involvement of the mesorectal fascia with tethering of the adjacent obturator musculature, posterolateral mesorectum with frank involvement and with involvement of pelvic sidewall fascia also seen on image 26/8. Shortest Distance of any tumor/node from Mesorectal fascia: 0 Extramural Vascular Invasion/Tumor Thrombus: Gross tumor extension with vessels that pass through this tumor without gross extramural venous or vascular invasion. Invasion of Anterior Peritoneal Reflection: Yes (image 19/3) Involvement of Adjacent Organs or Pelvic Sidewall: Tethering of the vas deferens (image 29/5) spiculated tumor extending to pelvic musculature and also involving the anterior margin of the piriformis on the LEFT involving neurovascular structures likely also sciatic nerve on image 33/6 and pelvic floor involvement seen on image 34/6. Levator Ani Involvement: Yes on the LEFT. N - CATEGORY Mesorectal Lymph Nodes >=34mm: 5 mm lymph node (image 24/5) This is  along the RIGHT posterolateral rectum. Mesorectal lymph node on image 29/5 measuring 5 mm. Abnormal signal within a 5 mm lymph node on image 20/5). Discrete lymph nodes are difficult to assess due to bulky extra rectal tumor involvement. Extra-mesorectal Lymphadenopathy: No to the extent evaluated on this limited pelvic MRI Other: Urinary bladder is grossly unremarkable as is the prostate. Vas deferens appear tethered along the LEFT anterolateral aspect of the tumor adjacent the seminal vesicles. Tumoral extension beyond the mesorectum involving pelvic floor and extending towards pelvic sidewall  along with involvement of neurovascular structures extends approximately 6.4 cm along the LEFT lateral rectum. Gas containing tract extends along the wall of the rectum within this area potentially a developing tumor related sinus tract. Dilation of the colon above the level of involvement with signs of colonic edema. IMPRESSION: 1. 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 2. Tumor appears to tether vas deferens as well. 3. Eccentric thickening of the LEFT lateral rectum may be reactive in the setting of developing sinus tract associated with extension of tumor beyond the rectum described above. Correlation with prior sigmoidoscopy results may be helpful to determine whether tumor extends outside of the rectum in this area with reactive changes of the rectal wall or frankly involves rectal wall. 4. Signs of colonic edema. Correlate with any symptoms of colitis related to developing obstruction in the setting of tumor which focally narrows the rectosigmoid colon. 5. Scattered small lymph nodes likely N2 disease, difficult to separate from gross tumor extension. Electronically Signed   By: Zetta Bills M.D.   On: 08/26/2022 16:15   DG Abd 1 View  Result Date: 08/21/2022 CLINICAL DATA:  Abdominal pain EXAM: ABDOMEN - 1 VIEW COMPARISON:  CT scan of the abdomen August 18, 2022 FINDINGS: Lung bases are normal. No free air, portal venous gas, or pneumatosis. The transverse colon is dilated to 7.2 cm. There is gas in normal caliber descending colon. Few small bowel loops in the left abdomen are identified measuring 3 cm, nonspecific. No other abnormalities. IMPRESSION: The transverse colon is dilated to 7.2 cm. There is gas in normal caliber descending colon. A few small bowel loops in the left abdomen  are identified, mildly prominent, measuring up to 3 cm, nonspecific. Electronically Signed   By: Dorise Bullion III M.D.   On: 08/21/2022 14:58    Microbiology: Results for orders placed or performed during the hospital encounter of 09/07/22  Surgical pcr screen     Status: None   Collection Time: 09/08/22  9:00 PM   Specimen: Nasal Mucosa; Nasal Swab  Result Value Ref Range Status   MRSA, PCR NEGATIVE NEGATIVE Final   Staphylococcus aureus NEGATIVE NEGATIVE Final    Comment: (NOTE) The Xpert SA Assay (FDA approved for NASAL specimens in patients 29 years of age and older), is one component of a comprehensive surveillance program. It is not intended to diagnose infection nor to guide or monitor treatment. Performed at [facility], [place] [address], [place], [place] [address]   Aerobic/Anaerobic Culture w Gram Stain (surgical/deep wound)     Status: None   Collection Time: 09/10/22  1:00 PM   Specimen: PATH Other; Tissue  Result Value Ref Range Status   Specimen Description   Final    PERITONEAL Performed  at [facility], [place] [address] [place], [place] [address]    Special Requests   Final    NONE Performed at [facility], [place] [address] [place], [place] [address]    Gram Stain   Final    MODERATE WBC PRESENT, PREDOMINANTLY PMN NO ORGANISMS SEEN    Culture   Final    RARE CLOSTRIDIUM CLOSTRIDIOFORME Standardized susceptibility testing for this organism is not available. Performed at [facility], [place] [address] [place], [place] [address]    Report Status 09/17/2022 FINAL  Final  MRSA Next Gen by PCR, Nasal     Status: None   Collection Time: 09/12/22  3:19 PM   Specimen: Nasal Mucosa; Nasal Swab  Result Value Ref Range Status   MRSA by PCR Next Gen NOT DETECTED NOT DETECTED Final    Comment: (NOTE) The GeneXpert MRSA Assay (FDA approved for NASAL specimens only), is one component of a comprehensive MRSA  colonization surveillance program. It is not intended to diagnose MRSA infection nor to guide or monitor treatment for MRSA infections. Test performance is not FDA approved in patients less than 30 years old. Performed at [facility], [place] [address] [place], [place] [address]     Labs: CBC: Recent Labs  Lab 09/12/22 0234 09/13/22 0234 09/14/22 0317 09/16/22 0546 09/17/22 1148  WBC 10.6* 11.1* 7.4 7.3 9.5  HGB 9.8* 10.1* 9.4* 11.1* 11.4*  HCT 30.9* 31.6* 29.5* 33.8* 35.3*  MCV 96.3 95.2 95.2 92.1 93.1  PLT 238 282 286 349 [id number]*   Basic Metabolic Panel: Recent Labs  Lab 09/13/22 0234 09/14/22 0317 09/15/22 0459 09/16/22 0546 09/17/22 1148  NA 140 139 139 135 137  K 4.7 4.1 3.3* 3.6 3.5  CL 107 107 100 103 103  CO2 22 24 30 24 23   GLUCOSE 106* 96 121* 137* 89  BUN 28* 23 18 16 18   CREATININE 1.06 0.87 0.86 0.71 0.68  CALCIUM 8.2* 8.2* 8.1* 8.5* 8.3*  MG 2.3 2.3 2.2 2.2  --   PHOS  --   --  3.0 3.1  --    Liver Function Tests: Recent Labs  Lab 09/12/22 0234 09/14/22 0317 09/15/22 0459 09/16/22 0546  AST 23 18 17  36  ALT 26 22 19 27   ALKPHOS 43 49 56 91  BILITOT 1.3* 1.2 0.9 0.9  PROT 5.1* 4.9* 5.1* 5.9*  ALBUMIN 2.6* 2.3* 2.4* 2.8*   CBG: Recent Labs  Lab 09/17/22 1445 09/17/22 2108 09/18/22 0641 09/18/22 0832 09/18/22 1233  GLUCAP 107* 113* 111* 112* 135*    Discharge time spent: less than 30 minutes.  Signed: Murray Hodgkins, MD [facility] 09/18/2022

## 2022-09-18 NOTE — Progress Notes (Signed)
8 Days Post-Op   Subjective/Chief Complaint: Doing well, no complaints. Ready to Go home.    Objective: Vital signs in last 24 hours: Temp:  [97.4 F (36.3 C)-98.2 F (36.8 C)] 98 F (36.7 C) (03/30 0605) Pulse Rate:  [42-149] 70 (03/30 0605) Resp:  [11-25] 16 (03/29 1832) BP: (119-168)/(71-115) 138/79 (03/30 0605) SpO2:  [96 %-100 %] 96 % (03/30 0605) Last BM Date : 09/17/22  Intake/Output from previous day: 03/29 0701 - 03/30 0700 In: 33.4 [I.V.:33.4] Out: 1575 [Urine:1525; Stool:50] Intake/Output this shift: No intake/output data recorded.  PE:  Constitutional: No acute distress, conversant, appears states age. Eyes: Anicteric sclerae, moist conjunctiva, no lid lag Lungs: Clear to auscultation bilaterally, normal respiratory effort CV: regular rate and rhythm, no murmurs, no peripheral edema, pedal pulses 2+ GI: Soft, no masses or hepatosplenomegaly, non-tender to palpation, ostomy patent with stool and gas output, stoma not visible. Midline wound is clean with healthy granulation. No significant drainage or exudate.  Skin: No rashes, palpation reveals normal turgor Psychiatric: appropriate judgment and insight, oriented to person, place, and time   Lab Results:  Recent Labs    09/16/22 0546 09/17/22 1148  WBC 7.3 9.5  HGB 11.1* 11.4*  HCT 33.8* 35.3*  PLT 349 413*    BMET Recent Labs    09/16/22 0546 09/17/22 1148  NA 135 137  K 3.6 3.5  CL 103 103  CO2 24 23  GLUCOSE 137* 89  BUN 16 18  CREATININE 0.71 0.68  CALCIUM 8.5* 8.3*   Anti-infectives: Anti-infectives (From admission, onward)    Start     Dose/Rate Route Frequency Ordered Stop   09/10/22 1600  piperacillin-tazobactam (ZOSYN) IVPB 3.375 g        3.375 g 12.5 mL/hr over 240 Minutes Intravenous Every 8 hours 09/10/22 1520 09/15/22 1929   09/10/22 0600  cefoTEtan (CEFOTAN) 2 g in sodium chloride 0.9 % 100 mL IVPB        2 g 200 mL/hr over 30 Minutes Intravenous On call to O.R. 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 Continue ostomy care  OK for discharge home from surgery standpoint   FEN: reg VTE: SQH ID: [id number] 3/22>3/27    LOS: 11 days    Scott [name] 09/18/2022

## 2022-09-18 NOTE — Progress Notes (Signed)
Discharge instructions reviewed with the patient and his spouse Mazzie. Discharge instructions and additional Instruction given regarding wound/dressing change/colostomy care. Supplies provided. Questions concerns were denied at this time. Pt is alert, oriented x 4 ambulatory 1 assist. Dressing and colostomy are clean dry intact.

## 2022-09-21 ENCOUNTER — Telehealth: Payer: Self-pay

## 2022-09-21 ENCOUNTER — Other Ambulatory Visit: Payer: Self-pay

## 2022-09-21 DIAGNOSIS — C20 Malignant neoplasm of rectum: Secondary | ICD-10-CM

## 2022-09-21 DIAGNOSIS — K6289 Other specified diseases of anus and rectum: Secondary | ICD-10-CM

## 2022-09-21 NOTE — Progress Notes (Signed)
I spoke with Si Raider, Mr Wescott's daughter.  They are agreeable to f/u appt with Dr Lorenso Courier on 4/9 at 1300, labs at 1230.  All questions were answered.  She verbalized understanding.

## 2022-09-21 NOTE — Telephone Encounter (Signed)
Pt daughter called wanting to know if her father had an upcoming appointment. The pt is one week post-op and was seen in the hospital by Dr.Dorsey. Pt daughter stated that the provider wanted to start chemotherapy treatment in two weeks,but no hospital follow up appointment had been scheduled. Pt daughter wanted to know what was the next step. I relay the message to Ilda Foil, RN and she stated that she would look into it.   Audry Riles, CMA

## 2022-09-21 NOTE — Progress Notes (Signed)
Referral for second med/onc opinion at [facility] faxed to [phone].

## 2022-09-28 ENCOUNTER — Encounter: Payer: Self-pay | Admitting: Hematology and Oncology

## 2022-09-28 ENCOUNTER — Other Ambulatory Visit: Payer: Self-pay

## 2022-09-28 ENCOUNTER — Inpatient Hospital Stay: Payer: Medicare Other | Attending: Hematology and Oncology | Admitting: Hematology and Oncology

## 2022-09-28 ENCOUNTER — Telehealth: Payer: Self-pay | Admitting: Dietician

## 2022-09-28 ENCOUNTER — Telehealth: Payer: Self-pay | Admitting: Hematology and Oncology

## 2022-09-28 ENCOUNTER — Inpatient Hospital Stay: Payer: Medicare Other

## 2022-09-28 VITALS — BP 143/96 | HR 106 | Temp 97.6°F | Resp 17 | Wt 144.2 lb

## 2022-09-28 DIAGNOSIS — Z5111 Encounter for antineoplastic chemotherapy: Secondary | ICD-10-CM | POA: Diagnosis not present

## 2022-09-28 DIAGNOSIS — D509 Iron deficiency anemia, unspecified: Secondary | ICD-10-CM | POA: Diagnosis not present

## 2022-09-28 DIAGNOSIS — Z87891 Personal history of nicotine dependence: Secondary | ICD-10-CM | POA: Diagnosis not present

## 2022-09-28 DIAGNOSIS — C20 Malignant neoplasm of rectum: Secondary | ICD-10-CM | POA: Insufficient documentation

## 2022-09-28 DIAGNOSIS — I10 Essential (primary) hypertension: Secondary | ICD-10-CM | POA: Diagnosis not present

## 2022-09-28 DIAGNOSIS — R634 Abnormal weight loss: Secondary | ICD-10-CM | POA: Diagnosis not present

## 2022-09-28 LAB — CBC WITH DIFFERENTIAL (CANCER CENTER ONLY)
Abs Immature Granulocytes: 0.03 10*3/uL (ref 0.00–0.07)
Basophils Absolute: 0 10*3/uL (ref 0.0–0.1)
Basophils Relative: 0 %
Eosinophils Absolute: 0.1 10*3/uL (ref 0.0–0.5)
Eosinophils Relative: 2 %
HCT: 32.1 % — ABNORMAL LOW (ref 39.0–52.0)
Hemoglobin: 11 g/dL — ABNORMAL LOW (ref 13.0–17.0)
Immature Granulocytes: 1 %
Lymphocytes Relative: 23 %
Lymphs Abs: 1.4 10*3/uL (ref 0.7–4.0)
MCH: 30.9 pg (ref 26.0–34.0)
MCHC: 34.3 g/dL (ref 30.0–36.0)
MCV: 90.2 fL (ref 80.0–100.0)
Monocytes Absolute: 0.4 10*3/uL (ref 0.1–1.0)
Monocytes Relative: 6 %
Neutro Abs: 4.3 10*3/uL (ref 1.7–7.7)
Neutrophils Relative %: 68 %
Platelet Count: 315 10*3/uL (ref 150–400)
RBC: 3.56 MIL/uL — ABNORMAL LOW (ref 4.22–5.81)
RDW: 15.8 % — ABNORMAL HIGH (ref 11.5–15.5)
WBC Count: 6.2 10*3/uL (ref 4.0–10.5)
nRBC: 0 % (ref 0.0–0.2)

## 2022-09-28 LAB — CMP (CANCER CENTER ONLY)
ALT: 29 U/L (ref 0–44)
AST: 24 U/L (ref 15–41)
Albumin: 3.3 g/dL — ABNORMAL LOW (ref 3.5–5.0)
Alkaline Phosphatase: 91 U/L (ref 38–126)
Anion gap: 8 (ref 5–15)
BUN: 22 mg/dL (ref 8–23)
CO2: 23 mmol/L (ref 22–32)
Calcium: 8.8 mg/dL — ABNORMAL LOW (ref 8.9–10.3)
Chloride: 110 mmol/L (ref 98–111)
Creatinine: 0.81 mg/dL (ref 0.61–1.24)
GFR, Estimated: >60 mL/min (ref >60)
Glucose, Bld: 109 mg/dL — ABNORMAL HIGH (ref 70–99)
Potassium: 3.3 mmol/L — ABNORMAL LOW (ref 3.5–5.1)
Sodium: 141 mmol/L (ref 135–145)
Total Bilirubin: 0.6 mg/dL (ref 0.3–1.2)
Total Protein: 6.2 g/dL — ABNORMAL LOW (ref 6.5–8.1)

## 2022-09-28 MED ORDER — MIRTAZAPINE 7.5 MG PO TABS: 7.5000 mg | ORAL_TABLET | Freq: Every day | ORAL | 0 refills | Status: DC

## 2022-09-28 MED ORDER — PROCHLORPERAZINE MALEATE 10 MG PO TABS: 10.0000 mg | ORAL_TABLET | Freq: Four times a day (QID) | ORAL | 0 refills | Status: DC | PRN

## 2022-09-28 MED ORDER — ONDANSETRON HCL 8 MG PO TABS: 8.0000 mg | ORAL_TABLET | Freq: Three times a day (TID) | ORAL | 0 refills | Status: DC | PRN

## 2022-09-28 MED ORDER — LIDOCAINE-PRILOCAINE 2.5-2.5 % EX CREA: 1.0000 | TOPICAL_CREAM | CUTANEOUS | 0 refills | Status: DC | PRN

## 2022-09-28 NOTE — Telephone Encounter (Signed)
Reached out to patients wife to schedule per WQ, patients wife aware of time and date of appointment.

## 2022-09-28 NOTE — Telephone Encounter (Signed)
per 4/9 IB reached out to patient to schedule. patients daughter aware of time and date of appointment.

## 2022-09-28 NOTE — Progress Notes (Signed)
START ON PATHWAY REGIMEN - Colorectal     A cycle is every 14 days:     Oxaliplatin      Leucovorin      Fluorouracil      Fluorouracil   **Always confirm dose/schedule in your pharmacy ordering system**  Patient Characteristics: Preoperative or Nonsurgical Candidate, M0 (Clinical Staging), Rectal, cT0/1, cN+; or cT4, Any cN; or Any cT, cN2 Tumor Location: Rectal Therapeutic Status: Preoperative or Nonsurgical Candidate, M0 (Clinical Staging) AJCC T Category: cT3 AJCC N Category: cN2b AJCC M Category: cM0 AJCC 8 Stage Grouping: IIIC Intent of Therapy: Curative Intent, Discussed with Patient

## 2022-09-28 NOTE — Progress Notes (Signed)
[facility] [phone]   Fax:([phone]  PROGRESS NOTE  Patient Care Team: [facility] as PCP - General Sherren Kerns, MD (Inactive) as Consulting Physician (Vascular Surgery) Chilton Si, MD as Attending Physician (Cardiology) [facility]  Hematological/Oncological History # Localized Rectal Adenocarcinoma, Stage IIIc. T3N2M0 08/18/2022: presented to ED, underwent CT angio chest abdomen pelvis which showed an asymmetric left lateral rectal wall thickening involving the low rectum with punctate foci of extraluminal gas and extensive soft tissue infiltration. Findings were concerning for colorectal cancer.  08/19/2022: Flexible sigmoidoscopy revealed a partially obstructive rectal mass, biopsies were obtained. Surgery was consulted who did not recommend any acute surgical intervention, recommended pelvic MRI as well as maintaining soft stools. Biopsy most consistent with rectal adenocarcinoma.  09/10/2022: due to bowel obstruction patient underwent a laparoscopic assisted loop colostomy placement and right colectomy. Cecal perforation noted during procedure.  09/18/2022: prolonged hospitalization, d/c on 09/18/2022.  10/06/2022: anticipated Cycle 1 Day 1 of neoadjuvant FOLFOX  Interval History:  Scott [name] 72 y.o. male with medical history significant for localized rectal adenocarcinoma who presents for a follow up visit. The patient's last visit was on 08/21/2022 while he was in the hospital. In the interim since the last visit he was discharged from the hospital.   On exam today Mr. Garate is accompanied by his wife and daughter.  He reports that his ostomy site is working well and producing mushy/solid stool.  He notes he has not seen any black stools or blood in the stool.  His appetite remains good and he is trying to eat more calories.  He notes he is not having any nausea, vomiting, or diarrhea.  He has had about 24 pounds of weight  loss this last year.  He notes that his energy levels have been pretty good.  He has been able to walk considerably and recently took 2 x 50 yard trips around the house.  He notes he is having some difficulty with anxiety and sleeping.  He otherwise denies any fevers, chills, sweats, nausea, vomiting or diarrhea.  A full 10 point ROS was otherwise negative.  The bulk of our discussion focused on the treatment moving forward and the role of total neoadjuvant treatment.  We discussed the side effects and expected benefits of chemotherapy as well as chemoradiation.  The patient voices understanding and was willing and able to proceed with treatment at this time.  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    SURGICAL HISTORY: Past Surgical History:  Procedure Laterality Date   ABDOMINAL AORTIC ENDOVASCULAR STENT GRAFT  02/02/2020    type b   APPENDECTOMY     BIOPSY  08/19/2022   Procedure: BIOPSY;  Surgeon: Imogene Burn, MD;  Location: [facility] ENDOSCOPY;  Service: Gastroenterology;;   COLON RESECTION N/A 09/10/2022   Procedure: LAPAROSCOPIC ASSISTED LOOP COLOSTOMY PLACEMENT, RIGHT COLECTOMY;  Surgeon: Romie Levee, MD;  Location: [facility] ORS;  Service: General;  Laterality: N/A;   FLEXIBLE SIGMOIDOSCOPY N/A 08/19/2022   Procedure: Arnell Sieving;  Surgeon: Imogene Burn, MD;  Location: [facility] ENDOSCOPY;  Service: Gastroenterology;  Laterality: N/A;   PORTACATH PLACEMENT N/A 09/10/2022   Procedure: INSERTION PORT-A-CATH;  Surgeon: Romie Levee, MD;  Location: [facility] ORS;  Service: General;  Laterality: N/A;   SUBMUCOSAL TATTOO INJECTION  08/19/2022   Procedure: SUBMUCOSAL TATTOO INJECTION;  Surgeon: Imogene Burn, MD;  Location: [facility] ENDOSCOPY;  Service: Gastroenterology;;   THORACIC AORTIC ENDOVASCULAR STENT GRAFT N/A  02/02/2020   Procedure: REPAIR TYPE B THORACIC AORTIC ENDOVASCULAR STENT;  Surgeon: Sherren Kerns, MD;  Location: [facility] OR;  Service: Vascular;  Laterality: N/A;    SOCIAL HISTORY: Social History   Socioeconomic History   Marital status: Married    Spouse name: Not on file   Number of children: 3   Years of education: Not on file   Highest education level: Not on file  Occupational History   Occupation: veteran  Tobacco Use   Smoking status: Former   Smokeless tobacco: Never   Tobacco comments:    Quit 2021  Vaping Use   Vaping Use: Never used  Substance and Sexual Activity   Alcohol use: Yes    Alcohol/week: 1.0 - 2.0 standard drink of alcohol    Types: 1 - 2 Cans of beer per week    Comment: 1-2 beers 1-2 times a week 08/26/21   Drug use: Yes    Types: Marijuana    Comment: used yesterday   Sexual activity: Not on file  Other Topics Concern   Not on file  Social History Narrative   Not on file   Social Determinants of Health   Financial Resource Strain: Not on file  Food Insecurity: No Food Insecurity (09/07/2022)   Hunger Vital Sign    Worried About Running Out of Food in the Last Year: Never true    Ran Out of Food in the Last Year: Never true  Transportation Needs: No Transportation Needs (09/07/2022)   PRAPARE - Administrator, Civil Service (Medical): No    Lack of Transportation (Non-Medical): No  Physical Activity: Not on file  Stress: Not on file  Social Connections: Unknown (07/09/2020)   Social Connection and Isolation Panel [NHANES]    Frequency of Communication with Friends and Family: More than three times a week    Frequency of Social Gatherings with Friends and Family: More than three times a week    Attends Religious Services: 1 to 4 times per year    Active Member of [employer] or Organizations: No    Attends Banker Meetings: 1 to 4 times per year    Marital Status: Patient declined  Intimate Partner Violence: Not At Risk  (09/07/2022)   Humiliation, Afraid, Rape, and Kick questionnaire    Fear of Current or Ex-Partner: No    Emotionally Abused: No    Physically Abused: No    Sexually Abused: No    FAMILY HISTORY: Family History  Problem Relation Age of Onset   Lung cancer Sister     ALLERGIES:  is allergic to amlodipine, carvedilol, hydralazine, valsartan, and cefotetan.  MEDICATIONS:  Current Outpatient Medications  Medication Sig Dispense Refill   lidocaine-prilocaine (EMLA) cream Apply 1 Application topically as needed. 30 g 0   mirtazapine (REMERON) 7.5 MG tablet Take 1 tablet (7.5 mg total) by mouth at bedtime. 30 tablet 0   ondansetron (ZOFRAN) 8 MG tablet Take 1 tablet (8 mg total) by mouth every 8 (eight) hours as needed. 30 tablet 0   prochlorperazine (COMPAZINE) 10 MG tablet Take 1 tablet (10 mg total) by mouth every 6 (six) hours as needed for nausea or vomiting. 30 tablet 0   empagliflozin (JARDIANCE) 25 MG TABS tablet Take 0.5 tablets by mouth every morning.  furosemide (LASIX) 40 MG tablet Take 40 mg by mouth daily as needed for edema or fluid.     hydrOXYzine (ATARAX) 25 MG tablet Take 1 tablet (25 mg total) by mouth 2 (two) times daily as needed for anxiety. 30 tablet 0   labetalol (NORMODYNE) 100 MG tablet Take 1 tablet (100 mg total) by mouth 3 (three) times daily with meals. 90 tablet 3   NON FORMULARY Take 1 tablet by mouth daily as needed (Constipation). Laxative. Unknown name.     oxyCODONE (OXY IR/ROXICODONE) 5 MG immediate release tablet Take 1-2 tablets (5-10 mg total) by mouth every 4 (four) hours as needed for severe pain. 30 tablet 0   polyethylene glycol (MIRALAX / GLYCOLAX) 17 g packet Take 17 g by mouth daily as needed for mild constipation or moderate constipation.     sacubitril-valsartan (ENTRESTO) 24-26 MG Take 0.5 tablets by mouth daily.     spironolactone (ALDACTONE) 25 MG tablet Take 0.5 tablets by mouth daily.     No current facility-administered medications  for this visit.    REVIEW OF SYSTEMS:   Constitutional: ( - ) fevers, ( - )  chills , ( - ) night sweats Eyes: ( - ) blurriness of vision, ( - ) double vision, ( - ) watery eyes Ears, nose, mouth, throat, and face: ( - ) mucositis, ( - ) sore throat Respiratory: ( - ) cough, ( - ) dyspnea, ( - ) wheezes Cardiovascular: ( - ) palpitation, ( - ) chest discomfort, ( - ) lower extremity swelling Gastrointestinal:  ( - ) nausea, ( - ) heartburn, ( - ) change in bowel habits Skin: ( - ) abnormal skin rashes Lymphatics: ( - ) new lymphadenopathy, ( - ) easy bruising Neurological: ( - ) numbness, ( - ) tingling, ( - ) new weaknesses Behavioral/Psych: ( - ) mood change, ( - ) new changes  All other systems were reviewed with the patient and are negative.  PHYSICAL EXAMINATION: ECOG PERFORMANCE STATUS: 1 - Symptomatic but completely ambulatory  Vitals:   09/28/22 1303  BP: (!) 143/96  Pulse: (!) 106  Resp: 17  Temp: 97.6 F (36.4 C)  SpO2: 98%   Filed Weights   09/28/22 1303  Weight: 144 lb 3.2 oz (65.4 kg)    GENERAL: Well-appearing elderly Caucasian male, alert, no distress and comfortable SKIN: skin color, texture, turgor are normal, no rashes or significant lesions EYES: conjunctiva are pink and non-injected, sclera clear LUNGS: clear to auscultation and percussion with normal breathing effort HEART: regular rate & rhythm and no murmurs and no lower extremity edema Musculoskeletal: no cyanosis of digits and no clubbing  ABDOMEN: Ostomy in place, clean dry and intact.  Semiformed stool in the ostomy bag. PSYCH: alert & oriented x 3, fluent speech NEURO: no focal motor/sensory deficits  LABORATORY DATA:  I have reviewed the data as listed    Latest Ref Rng & Units 09/28/2022   12:48 PM 09/17/2022   11:48 AM 09/16/2022    5:46 AM  CBC  WBC 4.0 - 10.5 K/uL 6.2  9.5  7.3   Hemoglobin 13.0 - 17.0 g/dL 16.1  09.6  04.5   Hematocrit 39.0 - 52.0 % 32.1  35.3  33.8   Platelets 150 -  400 K/uL 315  413  349        Latest Ref Rng & Units 09/28/2022   12:48 PM 09/17/2022   11:48 AM 09/16/2022    5:46 AM  CMP  Glucose 70 - 99 mg/dL 109  89  [id number]   BUN 8 - 23 mg/dL $[id number]$22  18  16   .030.71   Sodium 135 - 145 mmol/L 141  137  135   Potassium 3.5 - 5.1 mmol/L 3.3  3.5  3.6   Chloride 98 - 111 mmol/L 110  103  103   CO2 22 - 32 mmol/L 23  23  24    Calcium 8.9 - 10.3 mg/dL 8.8  8.3  8.5   Total Protein 6.5 - 8.1 g/dL 6.2   5.9   Total Bilirubin 0.3 - 1.2 mg/dL 0.6   0.9   Alkaline Phos 38 - 126 U/L 91   91   AST 15 - 41 U/L 24   36   ALT 0 - 44 U/L 29   27    RADIOGRAPHIC STUDIES: CT ABDOMEN PELVIS W CONTRAST  Result Date: 09/17/2022 CLINICAL DATA:  Postoperative abdominal pain, purulent drainage around stoma EXAM: CT ABDOMEN AND PELVIS WITH CONTRAST TECHNIQUE: Multidetector CT imaging of the abdomen and pelvis was performed using the standard protocol following bolus administration of intravenous contrast. RADIATION DOSE REDUCTION: This exam was performed according to the departmental dose-optimization program which includes automated exposure control, adjustment of the mA and/or kV according to patient size and/or use of iterative reconstruction technique. CONTRAST:  100mL OMNIPAQUE IOHEXOL 300 MG/ML  SOLN COMPARISON:  09/07/2022 FINDINGS: Lower chest: Trace left pleural effusion and minimal left basilar atelectasis. Distal extent of a thoracic aortic stent again noted. Hepatobiliary: Stable hepatic cysts. No intrahepatic biliary duct dilation. The gallbladder is unremarkable. No biliary duct dilation. Pancreas: Unremarkable. No pancreatic ductal dilatation or surrounding inflammatory changes. Spleen: Normal in size without focal abnormality. Adrenals/Urinary Tract: Stable appearance of the bilateral kidneys. No urinary tract calculi or obstruction. The adrenals and bladder are unremarkable. Stomach/Bowel: Postsurgical changes are seen from loop  colostomy left lower quadrant. Interval resection of the cecum, with ileocolic anastomosis within the right mid abdomen. The irregular enhancing rectal mass seen on prior study is again noted and unchanged, consistent with neoplasm. There is diffuse wall thickening of the distal sigmoid colon and rectum beyond the loop colostomy to the level of the obstructing rectal mass. There is mild diffuse small bowel dilation with scattered gas fluid levels, favor postoperative ileus. No evidence of high-grade small bowel obstruction. Vascular/Lymphatic: Aortic atherosclerosis. No enlarged abdominal or pelvic lymph nodes. Reproductive: Prostate is unremarkable. Other: Trace free fluid within the left upper quadrant and left hemipelvis. No free intraperitoneal gas. Postsurgical changes from midline laparotomy with packing material at the surgical site. No abdominal wall hernia. Musculoskeletal: No acute or destructive bony lesions. Reconstructed images demonstrate no additional findings. IMPRESSION: 1. Postsurgical changes from interval left lower quadrant loop colostomy, cecum resection, and ileocolic anastomosis as above. 2. Segmental wall thickening of the colon between the loop colostomy and obstructing rectal mass, consistent with underlying inflammatory, infectious, or ischemic colitis. 3. Stable irregular enhancing rectal mass consistent with neoplasm. 4. Mild small bowel distension with scattered gas fluid levels most consistent with postoperative ileus. No evidence of high-grade obstruction. 5. Trace left pleural effusion and trace ascites. 6.  Aortic Atherosclerosis (ICD10-I70.0). Electronically Signed   By: Sharlet SalinaMichael  Brown M.D.   On: 09/17/2022 15:13   DG Abd 1 View  Result Date: 09/13/2022 CLINICAL DATA:  Check gastric catheter placement EXAM: ABDOMEN - 1 VIEW COMPARISON:  None Available. FINDINGS: Gastric catheter  is noted within the stomach. Descending aortic stent graft is seen. Scattered large and small bowel  gas is noted. Mild persistent small bowel dilatation is seen. No free air is noted. IMPRESSION: Gastric catheter within the stomach. Electronically Signed   By: Alcide Clever M.D.   On: 09/13/2022 19:22   DG Abd 1 View  Result Date: 09/13/2022 CLINICAL DATA:  NG tube placement EXAM: ABDOMEN - 1 VIEW COMPARISON:  Previous studies including the examination of 09/12/2022 FINDINGS: Tip of NG tube is seen in the region of fundus of the stomach. Side port in the NG tube is not distinctly visualized. There is presence of gas in slightly dilated small bowel loops. There is previous endovascular stent repair in thoracic aorta. IMPRESSION: Tip of NG tube is seen in the fundus of the stomach. Electronically Signed   By: Ernie Avena M.D.   On: 09/13/2022 12:41   DG Abd Portable 1V  Result Date: 09/12/2022 CLINICAL DATA:  [id number].  Abdominal distention. EXAM: PORTABLE ABDOMEN - 1 VIEW COMPARISON:  CT with IV contrast 09/07/2022, abdomen film 08/21/2022 FINDINGS: Interval decompression of the large bowel since the prior study with interval increased small bowel dilatation. There is small bowel dilatation in the upper to mid abdomen up to 4.5 cm, previously 3 cm. There are decompressed small bowel segments in the right lower quadrant and upper pelvis. Findings concerning for mid to distal small bowel obstruction. There is no supine evidence of free air. No pathologic calcification or other significant finding is seen. Degenerative change lumbar spine. IMPRESSION: Interval decompression of the large bowel since the prior study with interval increased small bowel dilatation. Findings concerning for mid to distal small bowel obstruction. Electronically Signed   By: Almira Bar M.D.   On: 09/12/2022 22:06   DG CHEST PORT 1 VIEW  Result Date: 09/12/2022 CLINICAL DATA:  Shortness of breath. EXAM: PORTABLE CHEST 1 VIEW COMPARISON:  09/11/2022 FINDINGS: Single-view of the chest again demonstrates a prominent cardiac  silhouette which may be accentuated by slightly low lung volumes and the AP portable technique. Patient is also rotated towards the left on this examination. No focal lung densities are identified. Patient has an aortic stent graft involving the descending thoracic aorta. Stable position of the right jugular Port-A-Cath with the tip near the upper SVC region. Negative for pneumothorax. IMPRESSION: 1. Low lung volumes without focal disease. 2. Cardiac silhouette is prominent but probably accentuated by the low lung volumes and technique. Electronically Signed   By: Richarda Overlie M.D.   On: 09/12/2022 09:36   DG CHEST PORT 1 VIEW  Result Date: 09/11/2022 CLINICAL DATA:  Port-A-Cath placement EXAM: PORTABLE CHEST 1 VIEW COMPARISON:  04/11/2022 FINDINGS: Cardiac silhouette is grossly enlarged. Lungs are clear. Normal pulmonary vasculature. No pneumothorax. No pleural effusion. Descending thoracic aortic stent. Right-sided Port-A-Cath tip superimposed with the proximal SVC. IMPRESSION: Enlarged cardiac silhouette. Electronically Signed   By: Layla Maw M.D.   On: 09/11/2022 10:10   DG C-Arm 1-60 Min-No Report  Result Date: 09/10/2022 Fluoroscopy was utilized by the requesting physician.  No radiographic interpretation.   CT ABDOMEN PELVIS W CONTRAST  Result Date: 09/07/2022 CLINICAL DATA:  Vomiting, possible bowel obstruction EXAM: CT ABDOMEN AND PELVIS WITH CONTRAST TECHNIQUE: Multidetector CT imaging of the abdomen and pelvis was performed using the standard protocol following bolus administration of intravenous contrast. RADIATION DOSE REDUCTION: This exam was performed according to the departmental dose-optimization program which includes automated exposure control, adjustment of the mA  and/or kV according to patient size and/or use of iterative reconstruction technique. CONTRAST:  OMNIPAQUE IOHEXOL 300 MG/ML  SOLN COMPARISON:  08/18/2022 FINDINGS: Lower chest: Descending thoracic stent graft  partially visualized, stable in appearance. No pleural or pericardial effusion. Lung bases clear. Hepatobiliary: Gallbladder physiologically distended without wall thickening or regional inflammatory change. Stable hepatic cyst segment 4A. No new liver lesion or biliary ductal dilatation. Pancreas: Parenchymal atrophy without mass or ductal dilatation. No regional inflammatory change. Spleen: Normal in size without focal abnormality. Adrenals/Urinary Tract: No adrenal mass. 2.6 cm 21 HU exophytic mid right renal cyst; no follow-up warranted. Symmetric scratch the no hydronephrosis or urolithiasis. Urinary bladder physiologically distended. Stomach/Bowel: Stomach is decompressed. Duodenum and proximal small bowel decompressed. Mid and distal small bowel is dilated with gas and fluid. There is marked dilatation of the cecum up to 10.1 cm, and gaseous dilatation of the remainder of the colon through the sigmoid segment. There transition to a constricted somewhat irregular thick-walled enhancing segment in the distal mid/distal rectum over length of at least 2.7 cm, with irregular peripheral margins extending into perirectal fat posterolaterally on the right. Vascular/Lymphatic: Moderate aortoiliac calcified atheromatous plaque. Portal vein patent. No definite mesenteric, pelvic, or retroperitoneal adenopathy. Reproductive: Prostate is unremarkable. Other: Small volume pelvic and right upper abdominal ascites. No free air. Musculoskeletal: Multilevel lumbar spondylitic change. No fracture or worrisome lesion. IMPRESSION: 1. Suspected distal colonic obstruction secondary to 2.7 cm mid/distal rectal mass. Correlate with physical exam. 2. No regional adenopathy or evidence of distal metastatic disease. 3. Small volume pelvic and right upper abdominal ascites. 4.  Aortic Atherosclerosis (ICD10-I70.0). Electronically Signed   By: Corlis Leak M.D.   On: 09/07/2022 17:49    ASSESSMENT & PLAN Scott [name] 72 y.o. male with  medical history significant for localized rectal adenocarcinoma who presents for a follow up visit.  # Localized Rectal Adenocarcinoma, Stage IIIc. T3N2M0 --will plan to pursue TNT with chemotherapy followed by chemoradiation. Surgery would be after the upfront treatments.  -- will proceed with 8 cycles of FOLFOX chemotherapy, with Xeloda for chemoradiation. --patient has established care with Dr. Romie Levee in Surgery. Will make referral to radiation oncology --labs today show white blood cell 6.2, hemoglobin 11.0, MCV 90.2, and platelets of 315 -- Plan for return to clinic for the start of neoadjuvant chemotherapy.  Planned start date on 10/06/2022  #Supportive Care -- chemotherapy education to be scheduled  -- port placed by surgery -- zofran 8mg  q8H PRN and compazine 10mg  PO q6H for nausea -- EMLA cream for port -- no pain medication required at this time.    Orders Placed This Encounter  Procedures   CBC with Differential (Cancer Center Only)    Standing Status:   Future    Standing Expiration Date:   10/06/2023   CMP (Cancer Center only)    Standing Status:   Future    Standing Expiration Date:   10/06/2023    All questions were answered. The patient knows to call the clinic with any problems, questions or concerns.  A total of more than 30 minutes were spent on this encounter with face-to-face time and non-face-to-face time, including preparing to see the patient, ordering tests and/or medications, counseling the patient and coordination of care as outlined above.   Ulysees Barns, MD Department of Hematology/Oncology [facility] Phone: [phone] Pager: [phone] Email: [email or web address]  09/28/2022 2:03 PM

## 2022-09-29 ENCOUNTER — Other Ambulatory Visit: Payer: Self-pay | Admitting: *Deleted

## 2022-09-29 ENCOUNTER — Other Ambulatory Visit: Payer: Self-pay

## 2022-09-29 ENCOUNTER — Telehealth: Payer: Self-pay | Admitting: Radiation Oncology

## 2022-09-29 ENCOUNTER — Encounter: Payer: Medicare Other | Admitting: Dietician

## 2022-09-29 MED ORDER — PROCHLORPERAZINE MALEATE 10 MG PO TABS: 10.0000 mg | ORAL_TABLET | Freq: Four times a day (QID) | ORAL | 0 refills | Status: DC | PRN

## 2022-09-29 MED ORDER — ONDANSETRON HCL 8 MG PO TABS: 8.0000 mg | ORAL_TABLET | Freq: Three times a day (TID) | ORAL | 0 refills | Status: DC | PRN

## 2022-09-29 MED ORDER — LIDOCAINE-PRILOCAINE 2.5-2.5 % EX CREA: 1.0000 | TOPICAL_CREAM | CUTANEOUS | 0 refills | Status: DC | PRN

## 2022-09-29 NOTE — Progress Notes (Signed)
Pharmacist Chemotherapy Monitoring - Initial Assessment    Anticipated start date: 10/06/22   The following has been reviewed per standard work regarding the patient's treatment regimen: The patient's diagnosis, treatment plan and drug doses, and organ/hematologic function Lab orders and baseline tests specific to treatment regimen  The treatment plan start date, drug sequencing, and pre-medications Prior authorization status  Patient's documented medication list, including drug-drug interaction screen and prescriptions for anti-emetics and supportive care specific to the treatment regimen The drug concentrations, fluid compatibility, administration routes, and timing of the medications to be used The patient's access for treatment and lifetime cumulative dose history, if applicable  The patient's medication allergies and previous infusion related reactions, if applicable   Changes made to treatment plan:  N/A  Follow up needed:  N/A   Ebony Hail, Pharm.D., CPP 09/29/2022@4 :39 PM

## 2022-09-29 NOTE — Progress Notes (Signed)
Tiffany Craver from [facility] called stating they need a [facility] referral for this patient's second opinion appt.  I called her back letting her know we do not have a referral from the [facility] and have BCBS listed as his primary insurance.  I also spoke with Scott [name]'s daughter, Scott [name] letting her know we do not have a VA referral for care at [facility].  She will reach out to the [place] to get this referral for both [facility] and [facility]

## 2022-09-29 NOTE — Telephone Encounter (Signed)
LVM to schedule CON with Dr. Moody 

## 2022-09-30 ENCOUNTER — Telehealth: Payer: Self-pay | Admitting: Radiation Oncology

## 2022-09-30 NOTE — Telephone Encounter (Signed)
Spoke to pt's daughter Quitman Livings to arrange consult with Dr. Mitzi Hansen. I asked [name] what insurance they would be using for this, she advised BCBS Medicare due to delay with [facility]. [name] was advised they would need to use only BCBS Medicare since we do not have VA Auth prior to scheduling this appt. [name] verbalized understanding of this and was comfortable only using BCBS Medicare for radiation since [place] has not sent any auths and they are not wanting to delay pt's treatment any further. Consult appt made and billing specialist notified of insurance confirmation.

## 2022-10-04 ENCOUNTER — Inpatient Hospital Stay: Payer: Medicare Other | Admitting: Nutrition

## 2022-10-04 ENCOUNTER — Inpatient Hospital Stay: Payer: Medicare Other

## 2022-10-04 DIAGNOSIS — C20 Malignant neoplasm of rectum: Secondary | ICD-10-CM

## 2022-10-04 NOTE — Progress Notes (Signed)
Radiation Oncology         [phone] ________________________________  Name: Scott [name]        MRN: [id number]  Date of Service: 10/07/2022 DOB: [birth year]  OZ:[id number], Scott Altes, MD     REFERRING PHYSICIAN: Jaci Standard, MD   DIAGNOSIS: The encounter diagnosis was Rectal cancer.   HISTORY OF PRESENT ILLNESS: Scott [name] is a 72 y.o. male seen at the request of Dr. Leonides Schanz for diagnosis of rectal cancer.  The patient originally presented to the hospital in October 2023 with shortness of breath.  No focal findings were identified at that time.  He returned with symptoms in February 2024 and underwent a CT angiography of the chest abdomen and pelvis to rule out dissection.  No evidence of dissection was appreciated but there was a stable 4.1 cm ascending aortic aneurysm noted.  There was an interval development of tree-in-bud peribronchial nodularity within the right upper lobe, and asymmetric left lateral wall thickening involving a low lying tumor in the rectum with punctate foci of extraluminal gas and extensive soft tissue infiltration in the left perirectal soft tissues.  He underwent flexible sigmoidoscopy and a large amount of stool was seen in the sigmoid colon interfering with visualization.  A mass was seen in the rectum 5 cm in length.  A specimen was obtained consistent with an adenoma with high-grade dysplasia.  He returned for repeat assessment and biopsies on 09/01/2022 which did show adenocarcinoma.  An MRI of the pelvis also on 08/25/2022 showed what appeared to be a bulky tumor 7 cm from the anal verge extending in the mid to high rectum and it extended beyond the muscularis and into the perirectal fat, tumor obstructs the rectum at that level.  The measurement overall was 4.5 cm excluding the plaque-like thickening that extended along the left lateral rectal wall, it was classified as T3.  This extended beyond the mesorectum involving the pelvic floor and  towards the pelvic sidewall involving the neurovascular structures extending 6.4 cm along the left lateral rectum.  A gas tract extends along the wall of the rectum concerning for developing tumor related sinus tract.  N2 disease involving the mesorectum was also noted.  Initially it was felt that he did not need any surgical intervention, but returned to the hospital on 09/07/2022 with symptoms concerning for obstruction.  A distal obstruction was seen by CT on 09/07/2022 and he underwent loop colostomy for diversion on 09/10/2022 with Dr. Maisie Fus.  He had a prolonged recovery, but began total neoadjuvant chemotherapy yesterday with FOLFOX.  He is seen to discuss chemoradiation at the appropriate time.    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       PAST SURGICAL HISTORY: Past Surgical History:  Procedure Laterality Date   ABDOMINAL AORTIC ENDOVASCULAR STENT GRAFT  02/02/2020    type b   APPENDECTOMY     BIOPSY  08/19/2022   Procedure: BIOPSY;  Surgeon: Imogene Burn, MD;  Location: [facility] ENDOSCOPY;  Service: Gastroenterology;;   COLON RESECTION N/A 09/10/2022   Procedure: LAPAROSCOPIC ASSISTED LOOP COLOSTOMY PLACEMENT, RIGHT COLECTOMY;  Surgeon: Romie Levee, MD;  Location: [facility] ORS;  Service: General;  Laterality: N/A;   FLEXIBLE SIGMOIDOSCOPY N/A 08/19/2022  Procedure: FLEXIBLE SIGMOIDOSCOPY;  Surgeon: Imogene Burn, MD;  Location: [facility] ENDOSCOPY;  Service: Gastroenterology;  Laterality: N/A;   PORTACATH PLACEMENT N/A 09/10/2022   Procedure: INSERTION PORT-A-CATH;  Surgeon: Romie Levee, MD;  Location: [facility] ORS;  Service: General;  Laterality: N/A;   SUBMUCOSAL TATTOO INJECTION  08/19/2022   Procedure: SUBMUCOSAL TATTOO INJECTION;  Surgeon: Imogene Burn, MD;  Location: [facility] ENDOSCOPY;  Service: Gastroenterology;;   THORACIC AORTIC ENDOVASCULAR STENT GRAFT N/A 02/02/2020   Procedure: REPAIR TYPE B THORACIC AORTIC ENDOVASCULAR STENT;  Surgeon: Sherren Kerns, MD;  Location: [facility] OR;  Service: Vascular;  Laterality: N/A;     FAMILY HISTORY:  Family History  Problem Relation Age of Onset   Lung cancer Sister      SOCIAL HISTORY:  reports that he has quit smoking. He has never used smokeless tobacco. He reports current alcohol use of about 1.0 - 2.0 standard drink of alcohol per week. He reports current drug use. Drug: Marijuana.  The patient is married and lives in [place].   ALLERGIES: Amlodipine, Carvedilol, Hydralazine, Valsartan, and Cefotetan   MEDICATIONS:  Current Outpatient Medications  Medication Sig Dispense Refill   empagliflozin (JARDIANCE) 25 MG TABS tablet Take 0.5 tablets by mouth every morning.     furosemide (LASIX) 40 MG tablet Take 40 mg by mouth daily as needed for edema or fluid.     hydrOXYzine (ATARAX) 25 MG tablet Take 1 tablet (25 mg total) by mouth 2 (two) times daily as needed for anxiety. 30 tablet 0   labetalol (NORMODYNE) 100 MG tablet Take 1 tablet (100 mg total) by mouth 3 (three) times daily with meals. 90 tablet 3   lidocaine-prilocaine (EMLA) cream Apply 1 Application topically as needed. 30 g 0   mirtazapine (REMERON) 7.5 MG tablet Take 1 tablet (7.5 mg total) by mouth at bedtime. 30 tablet 0   NON FORMULARY Take 1 tablet by mouth daily as needed (Constipation). Laxative. Unknown name.     ondansetron (ZOFRAN) 8 MG tablet Take 1 tablet (8 mg total) by mouth every 8 (eight) hours as needed. 30 tablet 0   oxyCODONE (OXY IR/ROXICODONE) 5 MG immediate release tablet Take 1-2 tablets (5-10 mg total) by mouth every 4 (four) hours as needed for severe pain. 30 tablet 0   polyethylene glycol (MIRALAX / GLYCOLAX) 17 g packet Take 17 g by mouth daily as needed for mild constipation or moderate constipation.      prochlorperazine (COMPAZINE) 10 MG tablet Take 1 tablet (10 mg total) by mouth every 6 (six) hours as needed for nausea or vomiting. 30 tablet 0   sacubitril-valsartan (ENTRESTO) 24-26 MG Take 0.5 tablets by mouth daily.     spironolactone (ALDACTONE) 25 MG tablet Take 0.5 tablets by mouth daily.     No current facility-administered medications for this visit.     REVIEW OF SYSTEMS: On review of systems, the patient reports that  ***     PHYSICAL EXAM:  Wt Readings from Last 3 Encounters:  09/28/22 144 lb 3.2 oz (65.4 kg)  09/10/22 167 lb 8 oz (76 kg)  09/01/22 167 lb 8 oz (76 kg)   Temp Readings from Last 3 Encounters:  09/28/22 97.6 F (36.4 C) (Oral)  09/18/22 98.2 F (36.8 C) (Oral)  09/01/22 (!) 96.6 F (35.9 C) (Temporal)   BP Readings from Last 3 Encounters:  09/28/22 (!) 143/96  09/18/22 122/85  09/01/22 (!) 170/92   Pulse Readings from Last 3 Encounters:  09/28/22 Marland Kitchen)  106  09/18/22 92  09/01/22 94    /10  In general this is a***Caucasian male in no acute distress.  He's alert and oriented x4 and appropriate throughout the examination. Cardiopulmonary assessment is negative for acute distress and he exhibits normal effort.     ECOG = ***  0 - Asymptomatic (Fully active, able to carry on all predisease activities without restriction)  1 - Symptomatic but completely ambulatory (Restricted in physically strenuous activity but ambulatory and able to carry out work of a light or sedentary nature. For example, light housework, office work)  2 - Symptomatic, <50% in bed during the day (Ambulatory and capable of all self care but unable to carry out any work activities. Up and about more than 50% of waking hours)  3 - Symptomatic, >50% in bed, but not bedbound (Capable of only limited self-care, confined to bed or chair 50% or more of waking hours)  4 - Bedbound (Completely disabled. Cannot carry on any self-care. Totally confined to bed or chair)  5 -  Death   Santiago Glad MM, Creech RH, Tormey DC, et al. [phone]). "Toxicity and response criteria of the [facility]". Am. Evlyn Clines. Oncol. 5 (6): 649-55    LABORATORY DATA:  Lab Results  Component Value Date   WBC 6.2 09/28/2022   HGB 11.0 (L) 09/28/2022   HCT 32.1 (L) 09/28/2022   MCV 90.2 09/28/2022   PLT 315 09/28/2022   Lab Results  Component Value Date   NA 141 09/28/2022   K 3.3 (L) 09/28/2022   CL 110 09/28/2022   CO2 23 09/28/2022   Lab Results  Component Value Date   ALT 29 09/28/2022   AST 24 09/28/2022   ALKPHOS 91 09/28/2022   BILITOT 0.6 09/28/2022      RADIOGRAPHY: CT ABDOMEN PELVIS W CONTRAST  Result Date: 09/17/2022 CLINICAL DATA:  Postoperative abdominal pain, purulent drainage around stoma EXAM: CT ABDOMEN AND PELVIS WITH CONTRAST TECHNIQUE: Multidetector CT imaging of the abdomen and pelvis was performed using the standard protocol following bolus administration of intravenous contrast. RADIATION DOSE REDUCTION: This exam was performed according to the departmental dose-optimization program which includes automated exposure control, adjustment of the mA and/or kV according to patient size and/or use of iterative reconstruction technique. CONTRAST:  OMNIPAQUE IOHEXOL 300 MG/ML  SOLN COMPARISON:  09/07/2022 FINDINGS: Lower chest: Trace left pleural effusion and minimal left basilar atelectasis. Distal extent of a thoracic aortic stent again noted. Hepatobiliary: Stable hepatic cysts. No intrahepatic biliary duct dilation. The gallbladder is unremarkable. No biliary duct dilation. Pancreas: Unremarkable. No pancreatic ductal dilatation or surrounding inflammatory changes. Spleen: Normal in size without focal abnormality. Adrenals/Urinary Tract: Stable appearance of the bilateral kidneys. No urinary tract calculi or obstruction. The adrenals and bladder are unremarkable. Stomach/Bowel: Postsurgical changes are seen from loop colostomy left lower  quadrant. Interval resection of the cecum, with ileocolic anastomosis within the right mid abdomen. The irregular enhancing rectal mass seen on prior study is again noted and unchanged, consistent with neoplasm. There is diffuse wall thickening of the distal sigmoid colon and rectum beyond the loop colostomy to the level of the obstructing rectal mass. There is mild diffuse small bowel dilation with scattered gas fluid levels, favor postoperative ileus. No evidence of high-grade small bowel obstruction. Vascular/Lymphatic: Aortic atherosclerosis. No enlarged abdominal or pelvic lymph nodes. Reproductive: Prostate is unremarkable. Other: Trace free fluid within the left upper quadrant and left hemipelvis. No free intraperitoneal gas. Postsurgical changes from  midline laparotomy with packing material at the surgical site. No abdominal wall hernia. Musculoskeletal: No acute or destructive bony lesions. Reconstructed images demonstrate no additional findings. IMPRESSION: 1. Postsurgical changes from interval left lower quadrant loop colostomy, cecum resection, and ileocolic anastomosis as above. 2. Segmental wall thickening of the colon between the loop colostomy and obstructing rectal mass, consistent with underlying inflammatory, infectious, or ischemic colitis. 3. Stable irregular enhancing rectal mass consistent with neoplasm. 4. Mild small bowel distension with scattered gas fluid levels most consistent with postoperative ileus. No evidence of high-grade obstruction. 5. Trace left pleural effusion and trace ascites. 6.  Aortic Atherosclerosis (ICD10-I70.0). Electronically Signed   By: Sharlet Salina M.D.   On: 09/17/2022 15:13   DG Abd 1 View  Result Date: 09/13/2022 CLINICAL DATA:  Check gastric catheter placement EXAM: ABDOMEN - 1 VIEW COMPARISON:  None Available. FINDINGS: Gastric catheter is noted within the stomach. Descending aortic stent graft is seen. Scattered large and small bowel gas is noted. Mild  persistent small bowel dilatation is seen. No free air is noted. IMPRESSION: Gastric catheter within the stomach. Electronically Signed   By: Alcide Clever M.D.   On: 09/13/2022 19:22   DG Abd 1 View  Result Date: 09/13/2022 CLINICAL DATA:  NG tube placement EXAM: ABDOMEN - 1 VIEW COMPARISON:  Previous studies including the examination of 09/12/2022 FINDINGS: Tip of NG tube is seen in the region of fundus of the stomach. Side port in the NG tube is not distinctly visualized. There is presence of gas in slightly dilated small bowel loops. There is previous endovascular stent repair in thoracic aorta. IMPRESSION: Tip of NG tube is seen in the fundus of the stomach. Electronically Signed   By: Ernie Avena M.D.   On: 09/13/2022 12:41   DG Abd Portable 1V  Result Date: 09/12/2022 CLINICAL DATA:  16[address].  Abdominal distention. EXAM: PORTABLE ABDOMEN - 1 VIEW COMPARISON:  CT with IV contrast 09/07/2022, abdomen film 08/21/2022 FINDINGS: Interval decompression of the large bowel since the prior study with interval increased small bowel dilatation. There is small bowel dilatation in the upper to mid abdomen up to 4.5 cm, previously 3 cm. There are decompressed small bowel segments in the right lower quadrant and upper pelvis. Findings concerning for mid to distal small bowel obstruction. There is no supine evidence of free air. No pathologic calcification or other significant finding is seen. Degenerative change lumbar spine. IMPRESSION: Interval decompression of the large bowel since the prior study with interval increased small bowel dilatation. Findings concerning for mid to distal small bowel obstruction. Electronically Signed   By: Almira Bar M.D.   On: 09/12/2022 22:06   DG CHEST PORT 1 VIEW  Result Date: 09/12/2022 CLINICAL DATA:  Shortness of breath. EXAM: PORTABLE CHEST 1 VIEW COMPARISON:  09/11/2022 FINDINGS: Single-view of the chest again demonstrates a prominent cardiac silhouette which may  be accentuated by slightly low lung volumes and the AP portable technique. Patient is also rotated towards the left on this examination. No focal lung densities are identified. Patient has an aortic stent graft involving the descending thoracic aorta. Stable position of the right jugular Port-A-Cath with the tip near the upper SVC region. Negative for pneumothorax. IMPRESSION: 1. Low lung volumes without focal disease. 2. Cardiac silhouette is prominent but probably accentuated by the low lung volumes and technique. Electronically Signed   By: Richarda Overlie M.D.   On: 09/12/2022 09:36   DG CHEST PORT 1 VIEW  Result  Date: 09/11/2022 CLINICAL DATA:  Port-A-Cath placement EXAM: PORTABLE CHEST 1 VIEW COMPARISON:  04/11/2022 FINDINGS: Cardiac silhouette is grossly enlarged. Lungs are clear. Normal pulmonary vasculature. No pneumothorax. No pleural effusion. Descending thoracic aortic stent. Right-sided Port-A-Cath tip superimposed with the proximal SVC. IMPRESSION: Enlarged cardiac silhouette. Electronically Signed   By: Layla Maw M.D.   On: 09/11/2022 10:10   DG C-Arm 1-60 Min-No Report  Result Date: 09/10/2022 Fluoroscopy was utilized by the requesting physician.  No radiographic interpretation.   CT ABDOMEN PELVIS W CONTRAST  Result Date: 09/07/2022 CLINICAL DATA:  Vomiting, possible bowel obstruction EXAM: CT ABDOMEN AND PELVIS WITH CONTRAST TECHNIQUE: Multidetector CT imaging of the abdomen and pelvis was performed using the standard protocol following bolus administration of intravenous contrast. RADIATION DOSE REDUCTION: This exam was performed according to the departmental dose-optimization program which includes automated exposure control, adjustment of the mA and/or kV according to patient size and/or use of iterative reconstruction technique. CONTRAST:  OMNIPAQUE IOHEXOL 300 MG/ML  SOLN COMPARISON:  08/18/2022 FINDINGS: Lower chest: Descending thoracic stent graft partially visualized,  stable in appearance. No pleural or pericardial effusion. Lung bases clear. Hepatobiliary: Gallbladder physiologically distended without wall thickening or regional inflammatory change. Stable hepatic cyst segment 4A. No new liver lesion or biliary ductal dilatation. Pancreas: Parenchymal atrophy without mass or ductal dilatation. No regional inflammatory change. Spleen: Normal in size without focal abnormality. Adrenals/Urinary Tract: No adrenal mass. 2.6 cm 21 HU exophytic mid right renal cyst; no follow-up warranted. Symmetric scratch the no hydronephrosis or urolithiasis. Urinary bladder physiologically distended. Stomach/Bowel: Stomach is decompressed. Duodenum and proximal small bowel decompressed. Mid and distal small bowel is dilated with gas and fluid. There is marked dilatation of the cecum up to 10.1 cm, and gaseous dilatation of the remainder of the colon through the sigmoid segment. There transition to a constricted somewhat irregular thick-walled enhancing segment in the distal mid/distal rectum over length of at least 2.7 cm, with irregular peripheral margins extending into perirectal fat posterolaterally on the right. Vascular/Lymphatic: Moderate aortoiliac calcified atheromatous plaque. Portal vein patent. No definite mesenteric, pelvic, or retroperitoneal adenopathy. Reproductive: Prostate is unremarkable. Other: Small volume pelvic and right upper abdominal ascites. No free air. Musculoskeletal: Multilevel lumbar spondylitic change. No fracture or worrisome lesion. IMPRESSION: 1. Suspected distal colonic obstruction secondary to 2.7 cm mid/distal rectal mass. Correlate with physical exam. 2. No regional adenopathy or evidence of distal metastatic disease. 3. Small volume pelvic and right upper abdominal ascites. 4.  Aortic Atherosclerosis (ICD10-I70.0). Electronically Signed   By: Corlis Leak M.D.   On: 09/07/2022 17:49       IMPRESSION/PLAN: 1. 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 Dr. Mitzi Hansen discusses the pathology findings and reviews the nature of rectal carcinoma.  We discussed the risks, benefits, short, and long term effects of radiotherapy, as well as the curative intent, and the patient is interested in proceeding. Dr. Mitzi Hansen discusses the delivery and logistics of radiotherapy and anticipates a course of 5 1/2 weeks of concurrent  chemoradiation to the pelvis.  We will see him back in about 4 1/2 months after completing total neoadjuvant chemotherapy. We will keep Dr. Maisie Fus informed of treatment dates closer to starting radiation.  2. Risks of pelvic floor dysfunction from radiotherapy. We discussed the importance of evaluation with physical therapy prior to pelvic radiation. A referral was placed to physical therapy today. ***   In a visit lasting *** minutes, greater than 50% of  the time was spent face to face discussing the patient's condition, in preparation for the discussion, and coordinating the patient's care.   The above documentation reflects my direct findings during this shared patient visit. Please see the separate note by Dr. Mitzi Hansen on this date for the remainder of the patient's plan of care.    Osker Mason, [facility]   **Disclaimer: This note was dictated with voice recognition software. Similar sounding words can inadvertently be transcribed and this note may contain transcription errors which may not have been corrected upon publication of note.**

## 2022-10-04 NOTE — Progress Notes (Signed)
71 year old male diagnosed with rectal cancer status post colostomy on March 22.  He is followed by Dr. Leonides Schanz and will begin FOLFOX on April 17.  Past medical history includes hypertension, tobacco, atrial fibs, AAA aortic dissection.  Medications include Remeron, Zofran, Compazine, Jardiance, Lasix, MiraLAX.  Labs include potassium 3.3, glucose 109, albumin 3.3 on April 9.  Height: 66 inches. Weight: 148.4 pounds. Usual body weight: 167-177 pounds. BMI: 23.95.  Patient reports he is feeling well.  He is healing well from surgery and states he eats anything that he wants.  He denies any nutrition impact symptoms at this time.  His colostomy is functioning well and he describes soft stools.  Reports only consuming about 32 ounces of water daily.  Patient's wife has questions about sugar and cancer.  Patient states he tried boost breeze in the hospital and disliked it.  Nutrition diagnosis:  Food and nutrition related knowledge deficit related to rectal cancer and associated treatments as evidenced by no prior need for nutrition related information.  Intervention: Maintain weight with adequate calories and protein.   Educated on importance of increasing fluids to a minimum of 64 ounces daily. Education provided on eating with cold sensitivity. Answered questions regarding sugar and cancer. Provided samples Ensure Plus and boost plus, coupons, contact information.  I also provided multiple facts sheets on nutrition as discussed.  Monitoring, evaluation, goals: Patient will tolerate adequate calories and protein to continue healing and support weight maintenance throughout treatment.  Next visit: To be scheduled with upcoming treatment.  **Disclaimer: This note was dictated with voice recognition software. Similar sounding words can inadvertently be transcribed and this note may contain transcription errors which may not have been corrected upon publication of note.**

## 2022-10-05 ENCOUNTER — Ambulatory Visit (HOSPITAL_COMMUNITY)
Admission: RE | Admit: 2022-10-05 | Discharge: 2022-10-05 | Disposition: A | Discharge status: Home / Self Care | Payer: Medicare Other | Source: Ambulatory Visit | Attending: Nurse Practitioner | Admitting: Nurse Practitioner

## 2022-10-05 ENCOUNTER — Other Ambulatory Visit (HOSPITAL_COMMUNITY): Payer: Self-pay | Admitting: Nurse Practitioner

## 2022-10-05 DIAGNOSIS — Z933 Colostomy status: Secondary | ICD-10-CM | POA: Diagnosis not present

## 2022-10-05 DIAGNOSIS — L24B3 Irritant contact dermatitis related to fecal or urinary stoma or fistula: Secondary | ICD-10-CM

## 2022-10-05 DIAGNOSIS — Y839 Surgical procedure, unspecified as the cause of abnormal reaction of the patient, or of later complication, without mention of misadventure at the time of the procedure: Secondary | ICD-10-CM | POA: Insufficient documentation

## 2022-10-05 DIAGNOSIS — C189 Malignant neoplasm of colon, unspecified: Secondary | ICD-10-CM | POA: Diagnosis not present

## 2022-10-05 DIAGNOSIS — K9400 Colostomy complication, unspecified: Secondary | ICD-10-CM

## 2022-10-05 DIAGNOSIS — C20 Malignant neoplasm of rectum: Secondary | ICD-10-CM | POA: Diagnosis not present

## 2022-10-05 DIAGNOSIS — K9409 Other complications of colostomy: Secondary | ICD-10-CM | POA: Diagnosis not present

## 2022-10-05 MED FILL — Dexamethasone Sodium Phosphate Inj 100 MG/10ML: INTRAMUSCULAR | Qty: 1 | Status: AC

## 2022-10-05 NOTE — Progress Notes (Unsigned)
[facility] Telephone:([phone]   [phone]) [phone]  PROGRESS NOTE  Patient Care Team: [facility] as PCP - General Sherren Kerns, MD (Inactive) as Consulting Physician (Vascular Surgery) Chilton Si, MD as Attending Physician (Cardiology) [facility]  Hematological/Oncological History # Localized Rectal Adenocarcinoma, Stage IIIc. T3N2M0 08/18/2022: presented to ED, underwent CT angio chest abdomen pelvis which showed an asymmetric left lateral rectal wall thickening involving the low rectum with punctate foci of extraluminal gas and extensive soft tissue infiltration. Findings were concerning for colorectal cancer.  08/19/2022: Flexible sigmoidoscopy revealed a partially obstructive rectal mass, biopsies were obtained. Surgery was consulted who did not recommend any acute surgical intervention, recommended pelvic MRI as well as maintaining soft stools. Biopsy most consistent with rectal adenocarcinoma.  09/10/2022: due to bowel obstruction patient underwent a laparoscopic assisted loop colostomy placement and right colectomy. Cecal perforation noted during procedure.  09/18/2022: prolonged hospitalization, d/c on 09/18/2022.  10/06/2022: Cycle 1 Day 1 of neoadjuvant FOLFOX  Interval History:  Scott [name] 72 y.o. male with medical history significant for localized rectal adenocarcinoma who presents for a follow up visit. The patient's last visit was on 09/28/2022.  He presents today with his wife to start FOLFOX chemotherapy.  On exam today Mr. Procell his energy levels are overall stable. He is able to complete his ADLs on his own.  His appetite is unchanged and he has gained 4 lbs since the last visit. He denies nausea, vomiting or abdominal pain. He has good output with his ostomy. He denies easy bruising or signs of bleeding. He otherwise denies any fevers, chills, sweats, shortness of breath, chest pain or cough.  A full 10 point ROS was  otherwise negative.  The bulk of our discussion focused on assuring he had everything necessary to begin treatment.  At this time he is willing and able to proceed with neoadjuvant chemotherapy.  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    SURGICAL HISTORY: Past Surgical History:  Procedure Laterality Date   ABDOMINAL AORTIC ENDOVASCULAR STENT GRAFT  02/02/2020    type b   APPENDECTOMY     BIOPSY  08/19/2022   Procedure: BIOPSY;  Surgeon: Imogene Burn, MD;  Location: [facility] ENDOSCOPY;  Service: Gastroenterology;;   COLON RESECTION N/A 09/10/2022   Procedure: LAPAROSCOPIC ASSISTED LOOP COLOSTOMY PLACEMENT, RIGHT COLECTOMY;  Surgeon: Romie Levee, MD;  Location: [facility] ORS;  Service: General;  Laterality: N/A;   FLEXIBLE SIGMOIDOSCOPY N/A 08/19/2022   Procedure: Arnell Sieving;  Surgeon: Imogene Burn, MD;  Location: [facility] ENDOSCOPY;  Service: Gastroenterology;  Laterality: N/A;   PORTACATH PLACEMENT N/A 09/10/2022   Procedure: INSERTION PORT-A-CATH;  Surgeon: Romie Levee, MD;  Location: [facility] ORS;  Service: General;  Laterality: N/A;   SUBMUCOSAL TATTOO INJECTION  08/19/2022   Procedure: SUBMUCOSAL TATTOO INJECTION;  Surgeon: Imogene Burn, MD;  Location: [facility] ENDOSCOPY;  Service: Gastroenterology;;   THORACIC AORTIC ENDOVASCULAR STENT GRAFT N/A 02/02/2020   Procedure: REPAIR TYPE B THORACIC AORTIC ENDOVASCULAR STENT;  Surgeon: Sherren Kerns, MD;  Location: [facility] OR;  Service: Vascular;  Laterality: N/A;    SOCIAL HISTORY: Social History   Socioeconomic History   Marital status: Married    Spouse name: Not on file   Number of children: 3   Years of education: Not on  file   Highest education level: Not on file  Occupational History   Occupation: veteran  Tobacco Use   Smoking status: Former   Smokeless  tobacco: Never   Tobacco comments:    Quit 2021  Vaping Use   Vaping Use: Never used  Substance and Sexual Activity   Alcohol use: Yes    Alcohol/week: 1.0 - 2.0 standard drink of alcohol    Types: 1 - 2 Cans of beer per week    Comment: 1-2 beers 1-2 times a week 08/26/21   Drug use: Yes    Types: Marijuana    Comment: used yesterday   Sexual activity: Not on file  Other Topics Concern   Not on file  Social History Narrative   Not on file   Social Determinants of Health   Financial Resource Strain: Not on file  Food Insecurity: No Food Insecurity (09/07/2022)   Hunger Vital Sign    Worried About Running Out of Food in the Last Year: Never true    Ran Out of Food in the Last Year: Never true  Transportation Needs: No Transportation Needs (09/07/2022)   PRAPARE - Administrator, Civil Service (Medical): No    Lack of Transportation (Non-Medical): No  Physical Activity: Not on file  Stress: Not on file  Social Connections: Unknown (07/09/2020)   Social Connection and Isolation Panel [NHANES]    Frequency of Communication with Friends and Family: More than three times a week    Frequency of Social Gatherings with Friends and Family: More than three times a week    Attends Religious Services: 1 to 4 times per year    Active Member of [employer] or Organizations: No    Attends Banker Meetings: 1 to 4 times per year    Marital Status: Patient declined  Intimate Partner Violence: Not At Risk (09/07/2022)   Humiliation, Afraid, Rape, and Kick questionnaire    Fear of Current or Ex-Partner: No    Emotionally Abused: No    Physically Abused: No    Sexually Abused: No    FAMILY HISTORY: Family History  Problem Relation Age of Onset   Lung cancer Sister     ALLERGIES:  is allergic to amlodipine, carvedilol, hydralazine, valsartan, and cefotetan.  MEDICATIONS:  Current Outpatient Medications  Medication Sig Dispense Refill   empagliflozin (JARDIANCE) 25  MG TABS tablet Take 0.5 tablets by mouth every morning.     furosemide (LASIX) 40 MG tablet Take 40 mg by mouth daily as needed for edema or fluid.     hydrOXYzine (ATARAX) 25 MG tablet Take 1 tablet (25 mg total) by mouth 2 (two) times daily as needed for anxiety. 30 tablet 0   labetalol (NORMODYNE) 100 MG tablet Take 1 tablet (100 mg total) by mouth 3 (three) times daily with meals. 90 tablet 3   lidocaine-prilocaine (EMLA) cream Apply 1 Application topically as needed. 30 g 0   mirtazapine (REMERON) 7.5 MG tablet Take 1 tablet (7.5 mg total) by mouth at bedtime. 30 tablet 0   NON FORMULARY Take 1 tablet by mouth daily as needed (Constipation). Laxative. Unknown name.     ondansetron (ZOFRAN) 8 MG tablet Take 1 tablet (8 mg total) by mouth every 8 (eight) hours as needed. 30 tablet 0   oxyCODONE (OXY IR/ROXICODONE) 5 MG immediate release tablet Take 1-2 tablets (5-10 mg total) by mouth every 4 (four) hours as needed for severe pain. 30 tablet 0   polyethylene  glycol (MIRALAX / GLYCOLAX) 17 g packet Take 17 g by mouth daily as needed for mild constipation or moderate constipation.     prochlorperazine (COMPAZINE) 10 MG tablet Take 1 tablet (10 mg total) by mouth every 6 (six) hours as needed for nausea or vomiting. 30 tablet 0   sacubitril-valsartan (ENTRESTO) 24-26 MG Take 0.5 tablets by mouth daily.     spironolactone (ALDACTONE) 25 MG tablet Take 0.5 tablets by mouth daily.     No current facility-administered medications for this visit.   Facility-Administered Medications Ordered in Other Visits  Medication Dose Route Frequency Provider Last Rate Last Admin   fluorouracil (ADRUCIL) 4,200 mg in sodium chloride 0.9 % 66 mL chemo infusion  2,400 mg/m2 (Treatment Plan Recorded) Intravenous 1 day or 1 dose Jaci Standard, MD       fluorouracil (ADRUCIL) chemo injection 700 mg  400 mg/m2 (Treatment Plan Recorded) Intravenous Once Jaci Standard, MD       leucovorin 700 mg in dextrose 5 % 250  mL infusion  400 mg/m2 (Treatment Plan Recorded) Intravenous Once Jaci Standard, MD 143 mL/hr at 10/06/22 1106 700 mg at 10/06/22 1106   oxaliplatin (ELOXATIN) 150 mg in dextrose 5 % 500 mL chemo infusion  85 mg/m2 (Treatment Plan Recorded) Intravenous Once Jaci Standard, MD 265 mL/hr at 10/06/22 1113 150 mg at 10/06/22 1113    REVIEW OF SYSTEMS:   Constitutional: ( - ) fevers, ( - )  chills , ( - ) night sweats Eyes: ( - ) blurriness of vision, ( - ) double vision, ( - ) watery eyes Ears, nose, mouth, throat, and face: ( - ) mucositis, ( - ) sore throat Respiratory: ( - ) cough, ( - ) dyspnea, ( - ) wheezes Cardiovascular: ( - ) palpitation, ( - ) chest discomfort, ( - ) lower extremity swelling Gastrointestinal:  ( - ) nausea, ( - ) heartburn, ( - ) change in bowel habits Skin: ( - ) abnormal skin rashes Lymphatics: ( - ) new lymphadenopathy, ( - ) easy bruising Neurological: ( - ) numbness, ( - ) tingling, ( - ) new weaknesses Behavioral/Psych: ( - ) mood change, ( - ) new changes  All other systems were reviewed with the patient and are negative.  PHYSICAL EXAMINATION: ECOG PERFORMANCE STATUS: 1 - Symptomatic but completely ambulatory  Vitals:   10/06/22 0908  BP: 125/82  Pulse: 83  Resp: 15  Temp: 97.7 F (36.5 C)  SpO2: 97%    Filed Weights   10/06/22 0908  Weight: 148 lb 8 oz (67.4 kg)     GENERAL: Well-appearing elderly Caucasian male, alert, no distress and comfortable SKIN: skin color, texture, turgor are normal, no rashes or significant lesions EYES: conjunctiva are pink and non-injected, sclera clear LUNGS: clear to auscultation and percussion with normal breathing effort HEART: regular rate & rhythm and no murmurs and no lower extremity edema Musculoskeletal: no cyanosis of digits and no clubbing  ABDOMEN: Ostomy in place, clean dry and intact.  Semiformed stool in the ostomy bag. PSYCH: alert & oriented x 3, fluent speech NEURO: no focal motor/sensory  deficits  LABORATORY DATA:  I have reviewed the data as listed    Latest Ref Rng & Units 10/06/2022    8:43 AM 09/28/2022   12:48 PM 09/17/2022   11:48 AM  CBC  WBC 4.0 - 10.5 K/uL 4.7  6.2  9.5   Hemoglobin 13.0 - 17.0  g/dL 16.1  09.6  04.5   Hematocrit 39.0 - 52.0 % 32.2  32.1  35.3   Platelets 150 - 400 K/uL 223  315  413        Latest Ref Rng & Units 10/06/2022    8:43 AM 09/28/2022   12:48 PM 09/17/2022   11:48 AM  CMP  Glucose 70 - 99 mg/dL 409  811  89   BUN 8 - 23 mg/dL 16  22  18    Creatinine 0.61 - 1.24 mg/dL 9.14  7.82  9.56   Sodium 135 - 145 mmol/L 141  141  137   Potassium 3.5 - 5.1 mmol/L 4.0  3.3  3.5   Chloride 98 - 111 mmol/L 108  110  103   CO2 22 - 32 mmol/L 28  23  23    Calcium 8.9 - 10.3 mg/dL 8.8  8.8  8.3   Total Protein 6.5 - 8.1 g/dL 6.2  6.2    Total Bilirubin 0.3 - 1.2 mg/dL 0.5  0.6    Alkaline Phos 38 - 126 U/L 84  91    AST 15 - 41 U/L 14  24    ALT 0 - 44 U/L 15  29     RADIOGRAPHIC STUDIES: CT ABDOMEN PELVIS W CONTRAST  Result Date: 09/17/2022 CLINICAL DATA:  Postoperative abdominal pain, purulent drainage around stoma EXAM: CT ABDOMEN AND PELVIS WITH CONTRAST TECHNIQUE: Multidetector CT imaging of the abdomen and pelvis was performed using the standard protocol following bolus administration of intravenous contrast. RADIATION DOSE REDUCTION: This exam was performed according to the departmental dose-optimization program which includes automated exposure control, adjustment of the mA and/or kV according to patient size and/or use of iterative reconstruction technique. CONTRAST:  OMNIPAQUE IOHEXOL 300 MG/ML  SOLN COMPARISON:  09/07/2022 FINDINGS: Lower chest: Trace left pleural effusion and minimal left basilar atelectasis. Distal extent of a thoracic aortic stent again noted. Hepatobiliary: Stable hepatic cysts. No intrahepatic biliary duct dilation. The gallbladder is unremarkable. No biliary duct dilation. Pancreas: Unremarkable. No pancreatic  ductal dilatation or surrounding inflammatory changes. Spleen: Normal in size without focal abnormality. Adrenals/Urinary Tract: Stable appearance of the bilateral kidneys. No urinary tract calculi or obstruction. The adrenals and bladder are unremarkable. Stomach/Bowel: Postsurgical changes are seen from loop colostomy left lower quadrant. Interval resection of the cecum, with ileocolic anastomosis within the right mid abdomen. The irregular enhancing rectal mass seen on prior study is again noted and unchanged, consistent with neoplasm. There is diffuse wall thickening of the distal sigmoid colon and rectum beyond the loop colostomy to the level of the obstructing rectal mass. There is mild diffuse small bowel dilation with scattered gas fluid levels, favor postoperative ileus. No evidence of high-grade small bowel obstruction. Vascular/Lymphatic: Aortic atherosclerosis. No enlarged abdominal or pelvic lymph nodes. Reproductive: Prostate is unremarkable. Other: Trace free fluid within the left upper quadrant and left hemipelvis. No free intraperitoneal gas. Postsurgical changes from midline laparotomy with packing material at the surgical site. No abdominal wall hernia. Musculoskeletal: No acute or destructive bony lesions. Reconstructed images demonstrate no additional findings. IMPRESSION: 1. Postsurgical changes from interval left lower quadrant loop colostomy, cecum resection, and ileocolic anastomosis as above. 2. Segmental wall thickening of the colon between the loop colostomy and obstructing rectal mass, consistent with underlying inflammatory, infectious, or ischemic colitis. 3. Stable irregular enhancing rectal mass consistent with neoplasm. 4. Mild small bowel distension with scattered gas fluid levels most consistent with postoperative ileus. No evidence  of high-grade obstruction. 5. Trace left pleural effusion and trace ascites. 6.  Aortic Atherosclerosis (ICD10-I70.0). Electronically Signed   By:  Sharlet Salina M.D.   On: 09/17/2022 15:13   DG Abd 1 View  Result Date: 09/13/2022 CLINICAL DATA:  Check gastric catheter placement EXAM: ABDOMEN - 1 VIEW COMPARISON:  None Available. FINDINGS: Gastric catheter is noted within the stomach. Descending aortic stent graft is seen. Scattered large and small bowel gas is noted. Mild persistent small bowel dilatation is seen. No free air is noted. IMPRESSION: Gastric catheter within the stomach. Electronically Signed   By: Alcide Clever M.D.   On: 09/13/2022 19:22   DG Abd 1 View  Result Date: 09/13/2022 CLINICAL DATA:  NG tube placement EXAM: ABDOMEN - 1 VIEW COMPARISON:  Previous studies including the examination of 09/12/2022 FINDINGS: Tip of NG tube is seen in the region of fundus of the stomach. Side port in the NG tube is not distinctly visualized. There is presence of gas in slightly dilated small bowel loops. There is previous endovascular stent repair in thoracic aorta. IMPRESSION: Tip of NG tube is seen in the fundus of the stomach. Electronically Signed   By: Ernie Avena M.D.   On: 09/13/2022 12:41   DG Abd Portable 1V  Result Date: 09/12/2022 CLINICAL DATA:  16109.  Abdominal distention. EXAM: PORTABLE ABDOMEN - 1 VIEW COMPARISON:  CT with IV contrast 09/07/2022, abdomen film 08/21/2022 FINDINGS: Interval decompression of the large bowel since the prior study with interval increased small bowel dilatation. There is small bowel dilatation in the upper to mid abdomen up to 4.5 cm, previously 3 cm. There are decompressed small bowel segments in the right lower quadrant and upper pelvis. Findings concerning for mid to distal small bowel obstruction. There is no supine evidence of free air. No pathologic calcification or other significant finding is seen. Degenerative change lumbar spine. IMPRESSION: Interval decompression of the large bowel since the prior study with interval increased small bowel dilatation. Findings concerning for mid to  distal small bowel obstruction. Electronically Signed   By: Almira Bar M.D.   On: 09/12/2022 22:06   DG CHEST PORT 1 VIEW  Result Date: 09/12/2022 CLINICAL DATA:  Shortness of breath. EXAM: PORTABLE CHEST 1 VIEW COMPARISON:  09/11/2022 FINDINGS: Single-view of the chest again demonstrates a prominent cardiac silhouette which may be accentuated by slightly low lung volumes and the AP portable technique. Patient is also rotated towards the left on this examination. No focal lung densities are identified. Patient has an aortic stent graft involving the descending thoracic aorta. Stable position of the right jugular Port-A-Cath with the tip near the upper SVC region. Negative for pneumothorax. IMPRESSION: 1. Low lung volumes without focal disease. 2. Cardiac silhouette is prominent but probably accentuated by the low lung volumes and technique. Electronically Signed   By: Richarda Overlie M.D.   On: 09/12/2022 09:36   DG CHEST PORT 1 VIEW  Result Date: 09/11/2022 CLINICAL DATA:  Port-A-Cath placement EXAM: PORTABLE CHEST 1 VIEW COMPARISON:  04/11/2022 FINDINGS: Cardiac silhouette is grossly enlarged. Lungs are clear. Normal pulmonary vasculature. No pneumothorax. No pleural effusion. Descending thoracic aortic stent. Right-sided Port-A-Cath tip superimposed with the proximal SVC. IMPRESSION: Enlarged cardiac silhouette. Electronically Signed   By: Layla Maw M.D.   On: 09/11/2022 10:10   DG C-Arm 1-60 Min-No Report  Result Date: 09/10/2022 Fluoroscopy was utilized by the requesting physician.  No radiographic interpretation.   CT ABDOMEN PELVIS W CONTRAST  Result  Date: 09/07/2022 CLINICAL DATA:  Vomiting, possible bowel obstruction EXAM: CT ABDOMEN AND PELVIS WITH CONTRAST TECHNIQUE: Multidetector CT imaging of the abdomen and pelvis was performed using the standard protocol following bolus administration of intravenous contrast. RADIATION DOSE REDUCTION: This exam was performed according to the  departmental dose-optimization program which includes automated exposure control, adjustment of the mA and/or kV according to patient size and/or use of iterative reconstruction technique. CONTRAST:  OMNIPAQUE IOHEXOL 300 MG/ML  SOLN COMPARISON:  08/18/2022 FINDINGS: Lower chest: Descending thoracic stent graft partially visualized, stable in appearance. No pleural or pericardial effusion. Lung bases clear. Hepatobiliary: Gallbladder physiologically distended without wall thickening or regional inflammatory change. Stable hepatic cyst segment 4A. No new liver lesion or biliary ductal dilatation. Pancreas: Parenchymal atrophy without mass or ductal dilatation. No regional inflammatory change. Spleen: Normal in size without focal abnormality. Adrenals/Urinary Tract: No adrenal mass. 2.6 cm 21 HU exophytic mid right renal cyst; no follow-up warranted. Symmetric scratch the no hydronephrosis or urolithiasis. Urinary bladder physiologically distended. Stomach/Bowel: Stomach is decompressed. Duodenum and proximal small bowel decompressed. Mid and distal small bowel is dilated with gas and fluid. There is marked dilatation of the cecum up to 10.1 cm, and gaseous dilatation of the remainder of the colon through the sigmoid segment. There transition to a constricted somewhat irregular thick-walled enhancing segment in the distal mid/distal rectum over length of at least 2.7 cm, with irregular peripheral margins extending into perirectal fat posterolaterally on the right. Vascular/Lymphatic: Moderate aortoiliac calcified atheromatous plaque. Portal vein patent. No definite mesenteric, pelvic, or retroperitoneal adenopathy. Reproductive: Prostate is unremarkable. Other: Small volume pelvic and right upper abdominal ascites. No free air. Musculoskeletal: Multilevel lumbar spondylitic change. No fracture or worrisome lesion. IMPRESSION: 1. Suspected distal colonic obstruction secondary to 2.7 cm mid/distal rectal mass.  Correlate with physical exam. 2. No regional adenopathy or evidence of distal metastatic disease. 3. Small volume pelvic and right upper abdominal ascites. 4.  Aortic Atherosclerosis (ICD10-I70.0). Electronically Signed   By: Corlis Leak M.D.   On: 09/07/2022 17:49    ASSESSMENT & PLAN Scott [name] 72 y.o. male with medical history significant for localized rectal adenocarcinoma who presents for a follow up visit.  # Localized Rectal Adenocarcinoma, Stage IIIc. T3N2M0 --will plan to pursue TNT with chemotherapy followed by chemoradiation. Surgery would be after the upfront treatments.  -- will proceed with 8 cycles of FOLFOX chemotherapy, with Xeloda for chemoradiation. --patient has established care with Dr. Romie Levee in Surgery. Will make referral to radiation oncology Plan: --today is Cycle 1 Day 1 of neoadjuvant FOLFOX.  --labs today show WBC 4.7, Hgb 19.5, Plt 223. Creatinine and LFTs normal.  --Proceed with treatment today without any dose modifications -- RTC in 2 weeks with labs, follow up visit before cycle 2, day 1  # Normocytic anemia: --Hgb is 10.5, MCV 91/7 --Labs during hospitalization on 08/18/2022 showed iron deficiency with ferritin of 18. No  evidence of folate or B12 deficiency --Plan to repeat iron panel at next visit to determine if iron supplementation is needed  #Supportive Care -- chemotherapy education complete -- port placed by surgery -- zofran 8mg  q8H PRN and compazine 10mg  PO q6H for nausea -- EMLA cream for port -- no pain medication required at this time.   No orders of the defined types were placed in this encounter.  All questions were answered. The patient knows to call the clinic with any problems, questions or concerns. I have spent a total of 30  minutes minutes of face-to-face and non-face-to-face time, preparing to see the patient, performing a medically appropriate examination, counseling and educating the patient,documenting clinical information  in the electronic health record,  and care coordination.   Georga Kaufmann PA-C Dept of Hematology and Oncology [facility] at [facility] Phone: [phone]   10/06/2022 12:50 PM

## 2022-10-05 NOTE — Progress Notes (Signed)
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      furosemide (LASIX) 40 MG tablet Take 40 mg by mouth daily as needed for edema or fluid.      hydrOXYzine (ATARAX) 25 MG tablet Take 1 tablet (25 mg total) by mouth 2 (two) times daily as needed for anxiety. 30 tablet 0    labetalol (NORMODYNE) 100 MG tablet Take 1 tablet (100 mg total) by mouth 3 (three) times daily with meals. 90 tablet 3    lidocaine-prilocaine (EMLA) cream Apply 1 Application topically as needed. 30 g 0    mirtazapine (REMERON) 7.5 MG tablet Take 1 tablet (7.5 mg total) by mouth at bedtime. 30 tablet 0    NON FORMULARY Take 1 tablet by mouth daily as needed (Constipation). Laxative. Unknown name.      ondansetron (ZOFRAN) 8 MG tablet Take 1 tablet (8 mg total) by mouth every 8 (eight) hours as needed. 30 tablet 0    oxyCODONE (OXY IR/ROXICODONE) 5 MG immediate release tablet Take 1-2 tablets (5-10 mg total) by mouth every 4 (four) hours as needed for severe pain. 30 tablet 0    polyethylene glycol (MIRALAX / GLYCOLAX) 17 g packet Take 17 g by mouth daily as needed for mild constipation or moderate constipation.      prochlorperazine  (COMPAZINE) 10 MG tablet Take 1 tablet (10 mg total) by mouth every 6 (six) hours as needed for nausea or vomiting. 30 tablet 0    sacubitril-valsartan (ENTRESTO) 24-26 MG Take 0.5 tablets by mouth daily.      spironolactone (ALDACTONE) 25 MG tablet Take 0.5 tablets by mouth daily.      No current facility-administered medications for this encounter.   ROS  Review of Systems  Gastrointestinal:        LLQ colostomy  All other systems reviewed and are negative.  Vital signs:  BP (!) 128/94 (BP Location: Right Arm)   Pulse (!) 110   Temp 97.9 F (36.6 C) (Oral)   Resp 20   SpO2 98%  Exam:  Physical Exam Vitals reviewed.  Constitutional:      Appearance: Normal appearance.  Abdominal:     Palpations: Abdomen is soft.     Comments: Midline incision  Skin:    General: Skin is warm and dry.     Findings: Erythema, lesion and rash present.  Neurological:     Mental Status: He is alert and oriented to person, place, and time.  Psychiatric:        Mood and Affect: Mood normal.        Behavior: Behavior normal.  Stoma type/location:  LLQ colostomy Stomal assessment/size:  flush, 1 1/2" pink and moist  productive Peristomal assessment:  denuded skin from 4 to 7 o'clock from leaks.  Has been cutting opening too large and barrier ring has not covered this skin  Treatment options for stomal/peristomal skin: switch to 1 piece convex pouch, adding powder and skin prep to irritated skin and adding ostomy belt for added support/flush stoma Output: soft brown stool Ostomy pouching: 1pc. Convex with stoma powder, skin prep, barrier ring and ostomy belt. Education provided:  [facility]  Given script to show at [facility] (appointment tomorrow)  What supplies aren't covered there can be ordered through [facility] if needed.     Impression/dx  Colostomy complication Irritant dermatitis Discussion  See back one month as needed  Plan  Obtain supplies Demonstrated pouch  change with 1 piece pouch.  Cutting barrier to fit and applying barrier ring to peristomal skin and not pouch barrier to ensure a secure fit.     Visit time: 50 minutes.   Maple Hudson FNP-BC

## 2022-10-06 ENCOUNTER — Inpatient Hospital Stay: Payer: Medicare Other

## 2022-10-06 ENCOUNTER — Inpatient Hospital Stay (HOSPITAL_BASED_OUTPATIENT_CLINIC_OR_DEPARTMENT_OTHER): Payer: Medicare Other | Admitting: Physician Assistant

## 2022-10-06 ENCOUNTER — Other Ambulatory Visit: Payer: Self-pay

## 2022-10-06 VITALS — BP 125/82 | HR 83 | Temp 97.7°F | Resp 15 | Wt 148.5 lb

## 2022-10-06 DIAGNOSIS — C20 Malignant neoplasm of rectum: Secondary | ICD-10-CM | POA: Diagnosis not present

## 2022-10-06 DIAGNOSIS — Z5111 Encounter for antineoplastic chemotherapy: Secondary | ICD-10-CM

## 2022-10-06 DIAGNOSIS — D649 Anemia, unspecified: Secondary | ICD-10-CM

## 2022-10-06 DIAGNOSIS — Z95828 Presence of other vascular implants and grafts: Secondary | ICD-10-CM

## 2022-10-06 LAB — CMP (CANCER CENTER ONLY)
ALT: 15 U/L (ref 0–44)
AST: 14 U/L — ABNORMAL LOW (ref 15–41)
Albumin: 3.5 g/dL (ref 3.5–5.0)
Alkaline Phosphatase: 84 U/L (ref 38–126)
Anion gap: 5 (ref 5–15)
BUN: 16 mg/dL (ref 8–23)
CO2: 28 mmol/L (ref 22–32)
Calcium: 8.8 mg/dL — ABNORMAL LOW (ref 8.9–10.3)
Chloride: 108 mmol/L (ref 98–111)
Creatinine: 0.94 mg/dL (ref 0.61–1.24)
GFR, Estimated: >60 mL/min (ref >60)
Glucose, Bld: 119 mg/dL — ABNORMAL HIGH (ref 70–99)
Potassium: 4 mmol/L (ref 3.5–5.1)
Sodium: 141 mmol/L (ref 135–145)
Total Bilirubin: 0.5 mg/dL (ref 0.3–1.2)
Total Protein: 6.2 g/dL — ABNORMAL LOW (ref 6.5–8.1)

## 2022-10-06 LAB — CBC WITH DIFFERENTIAL (CANCER CENTER ONLY)
Abs Immature Granulocytes: 0.01 10*3/uL (ref 0.00–0.07)
Basophils Absolute: 0 10*3/uL (ref 0.0–0.1)
Basophils Relative: 0 %
Eosinophils Absolute: 0.1 10*3/uL (ref 0.0–0.5)
Eosinophils Relative: 2 %
HCT: 32.2 % — ABNORMAL LOW (ref 39.0–52.0)
Hemoglobin: 10.5 g/dL — ABNORMAL LOW (ref 13.0–17.0)
Immature Granulocytes: 0 %
Lymphocytes Relative: 32 %
Lymphs Abs: 1.5 10*3/uL (ref 0.7–4.0)
MCH: 29.9 pg (ref 26.0–34.0)
MCHC: 32.6 g/dL (ref 30.0–36.0)
MCV: 91.7 fL (ref 80.0–100.0)
Monocytes Absolute: 0.4 10*3/uL (ref 0.1–1.0)
Monocytes Relative: 8 %
Neutro Abs: 2.7 10*3/uL (ref 1.7–7.7)
Neutrophils Relative %: 58 %
Platelet Count: 223 10*3/uL (ref 150–400)
RBC: 3.51 MIL/uL — ABNORMAL LOW (ref 4.22–5.81)
RDW: 16.1 % — ABNORMAL HIGH (ref 11.5–15.5)
WBC Count: 4.7 10*3/uL (ref 4.0–10.5)
nRBC: 0 % (ref 0.0–0.2)

## 2022-10-06 MED ORDER — LEUCOVORIN CALCIUM INJECTION 350 MG
400.0000 mg/m2 | Freq: Once | INTRAVENOUS | Status: AC
  Administered 2022-10-06: 700 mg via INTRAVENOUS
  Filled 2022-10-06: qty 35

## 2022-10-06 MED ORDER — FLUOROURACIL CHEMO INJECTION 2.5 GM/50ML
400.0000 mg/m2 | Freq: Once | INTRAVENOUS | Status: AC
  Administered 2022-10-06: 700 mg via INTRAVENOUS
  Filled 2022-10-06: qty 14

## 2022-10-06 MED ORDER — SODIUM CHLORIDE 0.9% FLUSH
10.0000 mL | Freq: Once | INTRAVENOUS | Status: AC
  Administered 2022-10-06: 10 mL

## 2022-10-06 MED ORDER — PALONOSETRON HCL INJECTION 0.25 MG/5ML
0.2500 mg | Freq: Once | INTRAVENOUS | Status: AC
  Administered 2022-10-06: 0.25 mg via INTRAVENOUS
  Filled 2022-10-06: qty 5

## 2022-10-06 MED ORDER — SODIUM CHLORIDE 0.9 % IV SOLN
10.0000 mg | Freq: Once | INTRAVENOUS | Status: AC
  Administered 2022-10-06: 10 mg via INTRAVENOUS
  Filled 2022-10-06: qty 10

## 2022-10-06 MED ORDER — DEXTROSE 5 % IV SOLN: Freq: Once | INTRAVENOUS | Status: AC

## 2022-10-06 MED ORDER — OXALIPLATIN CHEMO INJECTION 100 MG/20ML
85.0000 mg/m2 | Freq: Once | INTRAVENOUS | Status: AC
  Administered 2022-10-06: 150 mg via INTRAVENOUS
  Filled 2022-10-06: qty 30

## 2022-10-06 MED ORDER — SODIUM CHLORIDE 0.9 % IV SOLN
2400.0000 mg/m2 | INTRAVENOUS | Status: DC
  Administered 2022-10-06: 4200 mg via INTRAVENOUS
  Filled 2022-10-06: qty 84

## 2022-10-06 NOTE — Patient Instructions (Signed)
[facility]  Discharge Instructions: Thank you for choosing [facility] to provide your oncology and hematology care.   If you have a lab appointment with the Cancer Center, please go directly to the Cancer Center and check in at the registration area.   Wear comfortable clothing and clothing appropriate for easy access to any Portacath or PICC line.   We strive to give you quality time with your provider. You may need to reschedule your appointment if you arrive late (15 or more minutes).  Arriving late affects you and other patients whose appointments are after yours.  Also, if you miss three or more appointments without notifying the office, you may be dismissed from the clinic at the provider's discretion.      For prescription refill requests, have your pharmacy contact our office and allow 72 hours for refills to be completed.    Today you received the following chemotherapy and/or immunotherapy agents oxaliplatin, leucovorin, fluorourcil      To help prevent nausea and vomiting after your treatment, we encourage you to take your nausea medication as directed.  BELOW ARE SYMPTOMS THAT SHOULD BE REPORTED IMMEDIATELY: *FEVER GREATER THAN 100.4 F (38 C) OR HIGHER *CHILLS OR SWEATING *NAUSEA AND VOMITING THAT IS NOT CONTROLLED WITH YOUR NAUSEA MEDICATION *UNUSUAL SHORTNESS OF BREATH *UNUSUAL BRUISING OR BLEEDING *URINARY PROBLEMS (pain or burning when urinating, or frequent urination) *BOWEL PROBLEMS (unusual diarrhea, constipation, pain near the anus) TENDERNESS IN MOUTH AND THROAT WITH OR WITHOUT PRESENCE OF ULCERS (sore throat, sores in mouth, or a toothache) UNUSUAL RASH, SWELLING OR PAIN  UNUSUAL VAGINAL DISCHARGE OR ITCHING   Items with * indicate a potential emergency and should be followed up as soon as possible or go to the Emergency Department if any problems should occur.  Please show the CHEMOTHERAPY ALERT CARD or  IMMUNOTHERAPY ALERT CARD at check-in to the Emergency Department and triage nurse.  Should you have questions after your visit or need to cancel or reschedule your appointment, please contact [facility]  Dept: [phone]  and follow the prompts.  Office hours are 8:00 a.m. to 4:30 p.m. Monday - Friday. Please note that voicemails left after 4:00 p.m. may not be returned until the following business day.  We are closed weekends and major holidays. You have access to a nurse at all times for urgent questions. Please call the main number to the clinic Dept: [phone] and follow the prompts.   For any non-urgent questions, you may also contact your provider using MyChart. We now offer e-Visits for anyone 18 and older to request care online for non-urgent symptoms. For details visit [email or web address].   Also download the MyChart app! Go to the app store, search "MyChart", open the app, select [facility], and log in with your MyChart username and password.  

## 2022-10-06 NOTE — Progress Notes (Signed)
GI Location of Tumor / Histology: Rectal Cancer  Scott [name] presented to the ER in February 2024 with complaints of shortness of breath.  CT Abd/Pelvis 09/17/2022: Postsurgical changes from interval left lower quadrant loop colostomy, cecum resection, and ileocolic anastomosis as above.  Segmental wall thickening of the colon between the loop colostomy and obstructing rectal mass, consistent with underlying inflammatory, infectious, or ischemic colitis.  Stable irregular enhancing rectal mass consistent with neoplasm.  MRI Pelvis 08/25/2022: Complex locally advanced tumor in the mid to high rectum involves the APR and extends laterally along the long segment of the LEFT lateral rectum to involve the pelvic floor, mesorectal fascia and branches of the hypogastric artery and likely sciatic nerve.  Tumor appears to tether vas deferens as well. Eccentric thickening of the LEFT lateral rectum may be reactive in the setting of developing sinus tract associated with extension of tumor beyond the rectum described above. Correlation with prior sigmoidoscopy results may be helpful to determine whether tumor extends outside of the rectum in this area with reactive changes of the rectal wall or frankly involves rectal wall.  Signs of colonic edema. Correlate with any symptoms of colitis related to developing obstruction in the setting of tumor which focally narrows the rectosigmoid colon. Scattered small lymph nodes likely N2 disease, difficult to separate from gross tumor extension.  Flexible Sigmoidoscopy: Mass in the rectum measuring 5 cm in length.  CTA CAP: No evidence of dissection.  Stable 4.1 cm ascending aortic aneurysm noted.  Interval development of tree-in-bud peribronchial nodularity within the right upper lobe, and asymmetric left lateral wall thickening involving a low lying tumor in the rectum with punctate foci of extraluminal gas and extensive soft tissue infiltration in the left perirectal soft  tissues.    Biopsies of Rectal Mass 09/01/2022   Past/Anticipated interventions by surgeon, if any:  Dr. Maisie Fus -Loop Colostomy 09/10/2022  Past/Anticipated interventions by medical oncology, if any:  Dr. Leonides Schanz -Neoadjuvant chemotherapy- FOLFOX 10/06/2022   Weight changes, if any:   Bowel/Bladder complaints, if any:   Nausea / Vomiting, if any:   Pain issues, if any:    Any blood per rectum:     SAFETY ISSUES: Prior radiation?  Pacemaker/ICD?  Possible current pregnancy? N/a Is the patient on methotrexate?   Current Complaints/Details:

## 2022-10-07 ENCOUNTER — Ambulatory Visit
Admission: RE | Admit: 2022-10-07 | Discharge: 2022-10-07 | Disposition: A | Discharge status: Home / Self Care | Payer: Medicare Other | Source: Ambulatory Visit | Attending: Radiation Oncology | Admitting: Radiation Oncology

## 2022-10-07 ENCOUNTER — Encounter: Payer: Self-pay | Admitting: Radiation Oncology

## 2022-10-07 ENCOUNTER — Telehealth: Payer: Self-pay

## 2022-10-07 VITALS — BP 122/73 | HR 78 | Temp 98.1°F | Resp 18 | Wt 152.0 lb

## 2022-10-07 DIAGNOSIS — M47816 Spondylosis without myelopathy or radiculopathy, lumbar region: Secondary | ICD-10-CM | POA: Insufficient documentation

## 2022-10-07 DIAGNOSIS — C20 Malignant neoplasm of rectum: Secondary | ICD-10-CM | POA: Diagnosis not present

## 2022-10-07 DIAGNOSIS — K7689 Other specified diseases of liver: Secondary | ICD-10-CM | POA: Insufficient documentation

## 2022-10-07 DIAGNOSIS — I700 Atherosclerosis of aorta: Secondary | ICD-10-CM | POA: Insufficient documentation

## 2022-10-07 DIAGNOSIS — I10 Essential (primary) hypertension: Secondary | ICD-10-CM | POA: Insufficient documentation

## 2022-10-07 DIAGNOSIS — Z79899 Other long term (current) drug therapy: Secondary | ICD-10-CM | POA: Diagnosis not present

## 2022-10-07 DIAGNOSIS — Z801 Family history of malignant neoplasm of trachea, bronchus and lung: Secondary | ICD-10-CM | POA: Diagnosis not present

## 2022-10-07 DIAGNOSIS — Z87891 Personal history of nicotine dependence: Secondary | ICD-10-CM | POA: Diagnosis not present

## 2022-10-07 DIAGNOSIS — I7102 Dissection of abdominal aorta: Secondary | ICD-10-CM | POA: Insufficient documentation

## 2022-10-07 DIAGNOSIS — R140 Abdominal distension (gaseous): Secondary | ICD-10-CM | POA: Diagnosis not present

## 2022-10-07 DIAGNOSIS — R918 Other nonspecific abnormal finding of lung field: Secondary | ICD-10-CM | POA: Diagnosis not present

## 2022-10-07 DIAGNOSIS — R188 Other ascites: Secondary | ICD-10-CM | POA: Insufficient documentation

## 2022-10-07 NOTE — Telephone Encounter (Signed)
-----   Message from Edger House, RN sent at 10/06/2022  4:00 PM EDT ----- Regarding: ft chemo dorsey Ft oxaliplatin, leucovorin, 50fu. Able to complete treatment. Scott [name]

## 2022-10-07 NOTE — Telephone Encounter (Signed)
LM for patient that this nurse was calling to see how they were doing after their treatment. Please call back to Dr.  Dorsey's nurse at [phone] if they have any questions or concerns regarding the treatment.  ?

## 2022-10-08 ENCOUNTER — Inpatient Hospital Stay: Payer: Medicare Other

## 2022-10-08 ENCOUNTER — Telehealth: Payer: Self-pay | Admitting: Radiation Oncology

## 2022-10-08 VITALS — BP 119/70 | HR 72 | Temp 98.1°F | Resp 18

## 2022-10-08 DIAGNOSIS — K9400 Colostomy complication, unspecified: Secondary | ICD-10-CM | POA: Insufficient documentation

## 2022-10-08 DIAGNOSIS — L24B3 Irritant contact dermatitis related to fecal or urinary stoma or fistula: Secondary | ICD-10-CM | POA: Insufficient documentation

## 2022-10-08 DIAGNOSIS — Z5111 Encounter for antineoplastic chemotherapy: Secondary | ICD-10-CM | POA: Diagnosis not present

## 2022-10-08 DIAGNOSIS — C20 Malignant neoplasm of rectum: Secondary | ICD-10-CM

## 2022-10-08 MED ORDER — SODIUM CHLORIDE 0.9% FLUSH
10.0000 mL | INTRAVENOUS | Status: DC | PRN
  Administered 2022-10-08: 10 mL

## 2022-10-08 MED ORDER — HEPARIN SOD (PORK) LOCK FLUSH 100 UNIT/ML IV SOLN
500.0000 [IU] | Freq: Once | INTRAVENOUS | Status: AC | PRN
  Administered 2022-10-08: 500 [IU]

## 2022-10-08 NOTE — Discharge Instructions (Signed)
[facility] needed for supplies

## 2022-10-08 NOTE — Telephone Encounter (Signed)
Lvm for daughter Quitman Livings to schedule FUN/SIM in August per securechat from [facility].

## 2022-10-09 ENCOUNTER — Other Ambulatory Visit: Payer: Self-pay

## 2022-10-11 ENCOUNTER — Telehealth: Payer: Self-pay | Admitting: *Deleted

## 2022-10-11 ENCOUNTER — Other Ambulatory Visit: Payer: Self-pay | Admitting: Hematology and Oncology

## 2022-10-11 MED ORDER — OXYCODONE HCL 5 MG PO TABS: 5.0000 mg | ORAL_TABLET | ORAL | 0 refills | Status: DC | PRN

## 2022-10-11 NOTE — Telephone Encounter (Signed)
Received fax from weekend on call service. Pt had called in with c/o increasing pain in his buttocks and near rectal tumor area. He had been on oxycodone  but hasn't had any even at time of hospital discharge.  Dr. Leonides Schanz notified

## 2022-10-16 ENCOUNTER — Other Ambulatory Visit: Payer: Self-pay

## 2022-10-18 ENCOUNTER — Ambulatory Visit: Payer: Self-pay | Admitting: Physical Therapy

## 2022-10-19 ENCOUNTER — Ambulatory Visit (HOSPITAL_COMMUNITY): Payer: Medicare Other | Admitting: Nurse Practitioner

## 2022-10-20 ENCOUNTER — Inpatient Hospital Stay: Payer: Medicare Other

## 2022-10-20 ENCOUNTER — Inpatient Hospital Stay: Payer: Medicare Other | Admitting: Dietician

## 2022-10-20 ENCOUNTER — Telehealth: Payer: Self-pay | Admitting: Physician Assistant

## 2022-10-20 ENCOUNTER — Inpatient Hospital Stay: Payer: Medicare Other | Attending: Physician Assistant | Admitting: Physician Assistant

## 2022-10-20 VITALS — BP 122/84 | HR 93 | Temp 97.9°F | Resp 16 | Ht 66.0 in | Wt 153.1 lb

## 2022-10-20 DIAGNOSIS — R110 Nausea: Secondary | ICD-10-CM | POA: Diagnosis not present

## 2022-10-20 DIAGNOSIS — Z801 Family history of malignant neoplasm of trachea, bronchus and lung: Secondary | ICD-10-CM | POA: Insufficient documentation

## 2022-10-20 DIAGNOSIS — Z95828 Presence of other vascular implants and grafts: Secondary | ICD-10-CM

## 2022-10-20 DIAGNOSIS — T451X5A Adverse effect of antineoplastic and immunosuppressive drugs, initial encounter: Secondary | ICD-10-CM | POA: Insufficient documentation

## 2022-10-20 DIAGNOSIS — Z79899 Other long term (current) drug therapy: Secondary | ICD-10-CM | POA: Diagnosis not present

## 2022-10-20 DIAGNOSIS — D509 Iron deficiency anemia, unspecified: Secondary | ICD-10-CM | POA: Diagnosis not present

## 2022-10-20 DIAGNOSIS — I10 Essential (primary) hypertension: Secondary | ICD-10-CM | POA: Insufficient documentation

## 2022-10-20 DIAGNOSIS — Z5111 Encounter for antineoplastic chemotherapy: Secondary | ICD-10-CM

## 2022-10-20 DIAGNOSIS — C20 Malignant neoplasm of rectum: Secondary | ICD-10-CM | POA: Diagnosis not present

## 2022-10-20 DIAGNOSIS — D701 Agranulocytosis secondary to cancer chemotherapy: Secondary | ICD-10-CM

## 2022-10-20 DIAGNOSIS — Z9049 Acquired absence of other specified parts of digestive tract: Secondary | ICD-10-CM | POA: Diagnosis not present

## 2022-10-20 DIAGNOSIS — D649 Anemia, unspecified: Secondary | ICD-10-CM

## 2022-10-20 DIAGNOSIS — Z87891 Personal history of nicotine dependence: Secondary | ICD-10-CM | POA: Diagnosis not present

## 2022-10-20 DIAGNOSIS — D500 Iron deficiency anemia secondary to blood loss (chronic): Secondary | ICD-10-CM

## 2022-10-20 LAB — CBC WITH DIFFERENTIAL (CANCER CENTER ONLY)
Abs Immature Granulocytes: 0.01 10*3/uL (ref 0.00–0.07)
Basophils Absolute: 0 10*3/uL (ref 0.0–0.1)
Basophils Relative: 1 %
Eosinophils Absolute: 0.1 10*3/uL (ref 0.0–0.5)
Eosinophils Relative: 2 %
HCT: 28.5 % — ABNORMAL LOW (ref 39.0–52.0)
Hemoglobin: 9.2 g/dL — ABNORMAL LOW (ref 13.0–17.0)
Immature Granulocytes: 1 %
Lymphocytes Relative: 53 %
Lymphs Abs: 1.2 10*3/uL (ref 0.7–4.0)
MCH: 29.3 pg (ref 26.0–34.0)
MCHC: 32.3 g/dL (ref 30.0–36.0)
MCV: 90.8 fL (ref 80.0–100.0)
Monocytes Absolute: 0.2 10*3/uL (ref 0.1–1.0)
Monocytes Relative: 7 %
Neutro Abs: 0.8 10*3/uL — ABNORMAL LOW (ref 1.7–7.7)
Neutrophils Relative %: 36 %
Platelet Count: 138 10*3/uL — ABNORMAL LOW (ref 150–400)
RBC: 3.14 MIL/uL — ABNORMAL LOW (ref 4.22–5.81)
RDW: 15.9 % — ABNORMAL HIGH (ref 11.5–15.5)
WBC Count: 2.2 10*3/uL — ABNORMAL LOW (ref 4.0–10.5)
nRBC: 0 % (ref 0.0–0.2)

## 2022-10-20 LAB — CMP (CANCER CENTER ONLY)
ALT: 9 U/L (ref 0–44)
AST: 14 U/L — ABNORMAL LOW (ref 15–41)
Albumin: 3.4 g/dL — ABNORMAL LOW (ref 3.5–5.0)
Alkaline Phosphatase: 72 U/L (ref 38–126)
Anion gap: 3 — ABNORMAL LOW (ref 5–15)
BUN: 16 mg/dL (ref 8–23)
CO2: 25 mmol/L (ref 22–32)
Calcium: 8.3 mg/dL — ABNORMAL LOW (ref 8.9–10.3)
Chloride: 111 mmol/L (ref 98–111)
Creatinine: 0.89 mg/dL (ref 0.61–1.24)
GFR, Estimated: >60 mL/min (ref >60)
Glucose, Bld: 94 mg/dL (ref 70–99)
Potassium: 4.2 mmol/L (ref 3.5–5.1)
Sodium: 139 mmol/L (ref 135–145)
Total Bilirubin: 0.4 mg/dL (ref 0.3–1.2)
Total Protein: 5.7 g/dL — ABNORMAL LOW (ref 6.5–8.1)

## 2022-10-20 LAB — FERRITIN: Ferritin: 59 ng/mL (ref 24–336)

## 2022-10-20 LAB — IRON AND IRON BINDING CAPACITY (CC-WL,HP ONLY)
Iron: 36 ug/dL — ABNORMAL LOW (ref 45–182)
Saturation Ratios: 11 % — ABNORMAL LOW (ref 17.9–39.5)
TIBC: 319 ug/dL (ref 250–450)
UIBC: 283 ug/dL (ref 117–376)

## 2022-10-20 MED ORDER — SODIUM CHLORIDE 0.9% FLUSH
10.0000 mL | Freq: Once | INTRAVENOUS | Status: AC
  Administered 2022-10-20: 10 mL

## 2022-10-20 MED ORDER — FERROUS SULFATE 325 (65 FE) MG PO TBEC: 325.0000 mg | DELAYED_RELEASE_TABLET | Freq: Every day | ORAL | 3 refills | Status: DC

## 2022-10-20 NOTE — Progress Notes (Deleted)
Nutrition Follow-up:  Patient with rectal cancer s/p colostomy 3/22. He is currently receiving FOLFOX (first 4/17).    Medications: reviewed   Labs: ***  Anthropometrics: Wt 153 lb 1.6 oz today stable  4/18 - 152 lb    NUTRITION DIAGNOSIS: Food and nutrition related knowledge deficit ***      INTERVENTION: ***    MONITORING, EVALUATION, GOAL: weight trends, intake   NEXT VISIT: ***

## 2022-10-20 NOTE — Progress Notes (Signed)
[facility] [phone]   [phone]  PROGRESS NOTE  Patient Care Team: [facility], [facility] as PCP - General Sherren Kerns, MD (Inactive) as Consulting Physician (Vascular Surgery) Chilton Si, MD as Attending Physician (Cardiology) [facility], [facility]  Hematological/Oncological History # Localized Rectal Adenocarcinoma, Stage IIIc. T3N2M0 08/18/2022: presented to ED, underwent CT angio chest abdomen pelvis which showed an asymmetric left lateral rectal wall thickening involving the low rectum with punctate foci of extraluminal gas and extensive soft tissue infiltration. Findings were concerning for colorectal cancer.  08/19/2022: Flexible sigmoidoscopy revealed a partially obstructive rectal mass, biopsies were obtained. Surgery was consulted who did not recommend any acute surgical intervention, recommended pelvic MRI as well as maintaining soft stools. Biopsy most consistent with rectal adenocarcinoma.  09/10/2022: due to bowel obstruction patient underwent a laparoscopic assisted loop colostomy placement and right colectomy. Cecal perforation noted during procedure.  09/18/2022: prolonged hospitalization, d/c on 09/18/2022.  10/06/2022: Cycle 1 Day 1 of neoadjuvant FOLFOX 10/20/2022: Cycle 2 Day 1 of neoadjuvant FOLFOX HELD due to neutropenia.   Interval History:  Scott [name] 72 y.o. male with medical history significant for localized rectal adenocarcinoma who presents for a follow up visit. The patient's last visit was on 10/06/2022. In the interim, he completed cycle 1 of FOLFOX. He presents today with his wife to for Cycle 2, Day 1 of FOLFOX chemotherapy.  On exam today Scott [name] reports he tolerated chemotherapy without significant limitations. He experienced minimal fatigue and is able to complete his ADLs on his own. He did have nausea approximately 4-5 days after treatment which improved with his prescribed medications. He has a good  appetite without any weight loss. He denies abdominal pain and has good output from his ostomy.  He denies easy bruising or signs of bleeding. He does struggle with insomnia and is currently taking oxycodone at night to help him sleep. He experienced mild cold sensitivity but denies any residual neuropathy. He denies any fevers, chills, sweats, shortness of breath, chest pain or cough.  A full 10 point ROS was otherwise negative.   At this time he is willing and able to proceed with neoadjuvant chemotherapy.  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    SURGICAL HISTORY: Past Surgical History:  Procedure Laterality Date   ABDOMINAL AORTIC ENDOVASCULAR STENT GRAFT  02/02/2020    type b   APPENDECTOMY     BIOPSY  08/19/2022   Procedure: BIOPSY;  Surgeon: Imogene Burn, MD;  Location: [facility] ENDOSCOPY;  Service: Gastroenterology;;   COLON RESECTION N/A 09/10/2022   Procedure: LAPAROSCOPIC ASSISTED LOOP COLOSTOMY PLACEMENT, RIGHT COLECTOMY;  Surgeon: Romie Levee, MD;  Location: [facility] ORS;  Service: General;  Laterality: N/A;   FLEXIBLE SIGMOIDOSCOPY N/A 08/19/2022   Procedure: Arnell Sieving;  Surgeon: Imogene Burn, MD;  Location: [facility] ENDOSCOPY;  Service: Gastroenterology;  Laterality: N/A;   PORTACATH PLACEMENT N/A 09/10/2022   Procedure: INSERTION PORT-A-CATH;  Surgeon: Romie Levee, MD;  Location: [facility] ORS;  Service: General;  Laterality: N/A;   SUBMUCOSAL TATTOO INJECTION  08/19/2022   Procedure: SUBMUCOSAL TATTOO INJECTION;  Surgeon: Imogene Burn, MD;  Location: [facility] ENDOSCOPY;  Service: Gastroenterology;;   THORACIC AORTIC ENDOVASCULAR STENT GRAFT N/A 02/02/2020   Procedure: REPAIR TYPE B THORACIC AORTIC ENDOVASCULAR STENT;  Surgeon: Fabienne Bruns  E, MD;  Location: [facility] OR;  Service: Vascular;   Laterality: N/A;    SOCIAL HISTORY: Social History   Socioeconomic History   Marital status: Married    Spouse name: Not on file   Number of children: 3   Years of education: Not on file   Highest education level: Not on file  Occupational History   Occupation: veteran  Tobacco Use   Smoking status: Former   Smokeless tobacco: Never   Tobacco comments:    Quit 2021  Vaping Use   Vaping Use: Never used  Substance and Sexual Activity   Alcohol use: Yes    Alcohol/week: 1.0 - 2.0 standard drink of alcohol    Types: 1 - 2 Cans of beer per week    Comment: 1-2 beers 1-2 times a week 08/26/21   Drug use: Yes    Types: Marijuana    Comment: used yesterday   Sexual activity: Not on file  Other Topics Concern   Not on file  Social History Narrative   Not on file   Social Determinants of Health   Financial Resource Strain: Not on file  Food Insecurity: No Food Insecurity (09/07/2022)   Hunger Vital Sign    Worried About Running Out of Food in the Last Year: Never true    Ran Out of Food in the Last Year: Never true  Transportation Needs: No Transportation Needs (09/07/2022)   PRAPARE - Administrator, Civil Service (Medical): No    Lack of Transportation (Non-Medical): No  Physical Activity: Not on file  Stress: Not on file  Social Connections: Unknown (07/09/2020)   Social Connection and Isolation Panel [NHANES]    Frequency of Communication with Friends and Family: More than three times a week    Frequency of Social Gatherings with Friends and Family: More than three times a week    Attends Religious Services: 1 to 4 times per year    Active Member of [employer] or Organizations: No    Attends Banker Meetings: 1 to 4 times per year    Marital Status: Patient declined  Intimate Partner Violence: Not At Risk (09/07/2022)   Humiliation, Afraid, Rape, and Kick questionnaire    Fear of Current or Ex-Partner: No    Emotionally Abused: No    Physically  Abused: No    Sexually Abused: No    FAMILY HISTORY: Family History  Problem Relation Age of Onset   Lung cancer Sister     ALLERGIES:  is allergic to amlodipine, carvedilol, hydralazine, valsartan, and cefotetan.  MEDICATIONS:  Current Outpatient Medications  Medication Sig Dispense Refill   empagliflozin (JARDIANCE) 25 MG TABS tablet Take 0.5 tablets by mouth every morning.     furosemide (LASIX) 40 MG tablet Take 40 mg by mouth daily as needed for edema or fluid.     hydrOXYzine (ATARAX) 25 MG tablet Take 1 tablet (25 mg total) by mouth 2 (two) times daily as needed for anxiety. 30 tablet 0   labetalol (NORMODYNE) 100 MG tablet Take 1 tablet (100 mg total) by mouth 3 (three) times daily with meals. 90 tablet 3   lidocaine-prilocaine (EMLA) cream Apply 1 Application topically as needed. 30 g 0   mirtazapine (REMERON) 7.5 MG tablet Take 1 tablet (7.5 mg total) by mouth at bedtime. 30 tablet 0   NON FORMULARY Take 1 tablet by mouth daily as needed (Constipation). Laxative. Unknown name.     ondansetron (ZOFRAN) 8 MG tablet Take  1 tablet (8 mg total) by mouth every 8 (eight) hours as needed. 30 tablet 0   oxyCODONE (OXY IR/ROXICODONE) 5 MG immediate release tablet Take 1-2 tablets (5-10 mg total) by mouth every 4 (four) hours as needed for severe pain. 30 tablet 0   polyethylene glycol (MIRALAX / GLYCOLAX) 17 g packet Take 17 g by mouth daily as needed for mild constipation or moderate constipation.     prochlorperazine (COMPAZINE) 10 MG tablet Take 1 tablet (10 mg total) by mouth every 6 (six) hours as needed for nausea or vomiting. 30 tablet 0   sacubitril-valsartan (ENTRESTO) 24-26 MG Take 0.5 tablets by mouth daily.     spironolactone (ALDACTONE) 25 MG tablet Take 0.5 tablets by mouth daily.     No current facility-administered medications for this visit.    REVIEW OF SYSTEMS:   Constitutional: ( - ) fevers, ( - )  chills , ( - ) night sweats Eyes: ( - ) blurriness of vision, ( -  ) double vision, ( - ) watery eyes Ears, nose, mouth, throat, and face: ( - ) mucositis, ( - ) sore throat Respiratory: ( - ) cough, ( - ) dyspnea, ( - ) wheezes Cardiovascular: ( - ) palpitation, ( - ) chest discomfort, ( - ) lower extremity swelling Gastrointestinal:  ( + ) nausea, ( - ) heartburn, ( - ) change in bowel habits Skin: ( - ) abnormal skin rashes Lymphatics: ( - ) new lymphadenopathy, ( - ) easy bruising Neurological: ( - ) numbness, ( - ) tingling, ( - ) new weaknesses Behavioral/Psych: ( - ) mood change, ( - ) new changes  All other systems were reviewed with the patient and are negative.  PHYSICAL EXAMINATION: ECOG PERFORMANCE STATUS: 1 - Symptomatic but completely ambulatory  Vitals:   10/20/22 0938  BP: 122/84  Pulse: 93  Resp: 16  Temp: 97.9 F (36.6 C)  SpO2: 100%    Filed Weights   10/20/22 0938  Weight: 153 lb 1.6 oz (69.4 kg)     GENERAL: Well-appearing elderly Caucasian male, alert, no distress and comfortable SKIN: skin color, texture, turgor are normal, no rashes or significant lesions EYES: conjunctiva are pink and non-injected, sclera clear LUNGS: clear to auscultation and percussion with normal breathing effort HEART: regular rate & rhythm and no murmurs and no lower extremity edema Musculoskeletal: no cyanosis of digits and no clubbing  ABDOMEN: Ostomy in place, clean dry and intact.  Semiformed stool in the ostomy bag. PSYCH: alert & oriented x 3, fluent speech NEURO: no focal motor/sensory deficits  LABORATORY DATA:  I have reviewed the data as listed    Latest Ref Rng & Units 10/20/2022    9:13 AM 10/06/2022    8:43 AM 09/28/2022   12:48 PM  CBC  WBC 4.0 - 10.5 K/uL 2.2  4.7  6.2   Hemoglobin 13.0 - 17.0 g/dL 9.2  95.2  84.1   Hematocrit 39.0 - 52.0 % 28.5  32.2  32.1   Platelets 150 - 400 K/uL 138  223  315        Latest Ref Rng & Units 10/20/2022    9:13 AM 10/06/2022    8:43 AM 09/28/2022   12:48 PM  CMP  Glucose 70 - 99 mg/dL 94   324  401   BUN 8 - 23 mg/dL 16  16  22    Creatinine 0.61 - 1.24 mg/dL 0.27  2.53  6.64   Sodium 135 -  145 mmol/L 139  141  141   Potassium 3.5 - 5.1 mmol/L 4.2  4.0  3.3   Chloride 98 - 111 mmol/L 111  108  110   CO2 22 - 32 mmol/L 25  28  23    Calcium 8.9 - 10.3 mg/dL 8.3  8.8  8.8   Total Protein 6.5 - 8.1 g/dL 5.7  6.2  6.2   Total Bilirubin 0.3 - 1.2 mg/dL 0.4  0.5  0.6   Alkaline Phos 38 - 126 U/L 72  84  91   AST 15 - 41 U/L 14  14  24    ALT 0 - 44 U/L 9  15  29     RADIOGRAPHIC STUDIES: No results found.  ASSESSMENT & PLAN Scott [name] 72 y.o. male with medical history significant for localized rectal adenocarcinoma who presents for a follow up visit.  # Localized Rectal Adenocarcinoma, Stage IIIc. T3N2M0 --will plan to pursue TNT with chemotherapy followed by chemoradiation. Surgery would be after the upfront treatments.  -- will proceed with 8 cycles of FOLFOX chemotherapy, with Xeloda for chemoradiation. --patient has established care with Dr. Romie Levee in Surgery and Dr. Mitzi Hansen in radiation oncology --Started Cycle 1, Day 1 of neoadjvuant FOLFOX on 10/06/2022. Plan: --today is Cycle 2 Day 1 of neoadjuvant FOLFOX.  --labs today show WBC 2.2, Hgb 9.2, Plt 138. ANC 800. Creatinine and LFTs normal.  --Will HOLD treatment today due to neutropenia. Provided neutropenic precautions including monitoring for fevers.  -- RTC in 1 week with labs, follow up visit before cycle 2, day 1  # Normocytic anemia: --Hgb is 9.2, MCV 90.8 --Labs during hospitalization on 08/18/2022 showed iron deficiency with ferritin of 18. No  evidence of folate or B12 deficiency --Repeat iron panel shows iron deficiency with serum iron 36, saturation 11%, ferritin pending.  --Recommend ferrous sulfate 325 mg once daily with a source of vitamin C.   #Supportive Care -- chemotherapy education complete -- port placed by surgery -- zofran 8mg  q8H PRN and compazine 10mg  PO q6H for nausea -- EMLA cream for  port -- no pain medication required at this time.   Orders Placed This Encounter  Procedures   CBC with Differential (Cancer Center Only)    Standing Status:   Future    Standing Expiration Date:   10/26/2023   CMP (Cancer Center only)    Standing Status:   Future    Standing Expiration Date:   10/26/2023   All questions were answered. The patient knows to call the clinic with any problems, questions or concerns. I have spent a total of 30 minutes minutes of face-to-face and non-face-to-face time, preparing to see the patient, performing a medically appropriate examination, counseling and educating the patient,documenting clinical information in the electronic health record,  and care coordination.   Georga Kaufmann PA-C Dept of Hematology and Oncology [facility] Phone: [phone]   10/20/2022 1:27 PM

## 2022-10-21 ENCOUNTER — Telehealth: Payer: Self-pay | Admitting: Hematology and Oncology

## 2022-10-22 ENCOUNTER — Encounter: Payer: Self-pay | Admitting: Hematology and Oncology

## 2022-10-22 ENCOUNTER — Telehealth: Payer: Self-pay | Admitting: *Deleted

## 2022-10-22 NOTE — Telephone Encounter (Signed)
Notified of message below

## 2022-10-22 NOTE — Telephone Encounter (Signed)
-----   Message from Kyra Searles, RN sent at 10/21/2022  5:35 PM EDT -----  ----- Message ----- From: Raymondo Band Sent: 10/20/2022   8:18 PM EDT To: Kyra Searles, RN  Can you notify patient that his iron panel from yesterday came back showing some deficiency. I sent a prescription for ferrous sulfate 325 mg that he can take once daily or once every other day.

## 2022-10-26 ENCOUNTER — Telehealth: Payer: Self-pay | Admitting: Physician Assistant

## 2022-10-26 ENCOUNTER — Inpatient Hospital Stay (HOSPITAL_BASED_OUTPATIENT_CLINIC_OR_DEPARTMENT_OTHER): Payer: Medicare Other | Admitting: Physician Assistant

## 2022-10-26 ENCOUNTER — Telehealth: Payer: Self-pay

## 2022-10-26 ENCOUNTER — Other Ambulatory Visit: Payer: Self-pay

## 2022-10-26 ENCOUNTER — Inpatient Hospital Stay: Payer: Medicare Other

## 2022-10-26 VITALS — BP 143/84 | HR 92 | Temp 97.7°F | Resp 20 | Wt 155.4 lb

## 2022-10-26 DIAGNOSIS — C20 Malignant neoplasm of rectum: Secondary | ICD-10-CM | POA: Diagnosis not present

## 2022-10-26 DIAGNOSIS — D509 Iron deficiency anemia, unspecified: Secondary | ICD-10-CM

## 2022-10-26 DIAGNOSIS — Z5111 Encounter for antineoplastic chemotherapy: Secondary | ICD-10-CM

## 2022-10-26 DIAGNOSIS — Z87891 Personal history of nicotine dependence: Secondary | ICD-10-CM | POA: Diagnosis not present

## 2022-10-26 DIAGNOSIS — D701 Agranulocytosis secondary to cancer chemotherapy: Secondary | ICD-10-CM | POA: Diagnosis not present

## 2022-10-26 DIAGNOSIS — R110 Nausea: Secondary | ICD-10-CM | POA: Diagnosis not present

## 2022-10-26 DIAGNOSIS — T451X5A Adverse effect of antineoplastic and immunosuppressive drugs, initial encounter: Secondary | ICD-10-CM | POA: Diagnosis not present

## 2022-10-26 DIAGNOSIS — Z9049 Acquired absence of other specified parts of digestive tract: Secondary | ICD-10-CM | POA: Diagnosis not present

## 2022-10-26 DIAGNOSIS — Z95828 Presence of other vascular implants and grafts: Secondary | ICD-10-CM

## 2022-10-26 DIAGNOSIS — Z79899 Other long term (current) drug therapy: Secondary | ICD-10-CM | POA: Diagnosis not present

## 2022-10-26 DIAGNOSIS — Z801 Family history of malignant neoplasm of trachea, bronchus and lung: Secondary | ICD-10-CM | POA: Diagnosis not present

## 2022-10-26 DIAGNOSIS — I10 Essential (primary) hypertension: Secondary | ICD-10-CM | POA: Diagnosis not present

## 2022-10-26 LAB — CBC WITH DIFFERENTIAL (CANCER CENTER ONLY)
Abs Immature Granulocytes: 0.01 10*3/uL (ref 0.00–0.07)
Basophils Absolute: 0 10*3/uL (ref 0.0–0.1)
Basophils Relative: 1 %
Eosinophils Absolute: 0.1 10*3/uL (ref 0.0–0.5)
Eosinophils Relative: 3 %
HCT: 31.3 % — ABNORMAL LOW (ref 39.0–52.0)
Hemoglobin: 9.9 g/dL — ABNORMAL LOW (ref 13.0–17.0)
Immature Granulocytes: 1 %
Lymphocytes Relative: 70 %
Lymphs Abs: 1.3 10*3/uL (ref 0.7–4.0)
MCH: 29.4 pg (ref 26.0–34.0)
MCHC: 31.6 g/dL (ref 30.0–36.0)
MCV: 92.9 fL (ref 80.0–100.0)
Monocytes Absolute: 0.3 10*3/uL (ref 0.1–1.0)
Monocytes Relative: 16 %
Neutro Abs: 0.2 10*3/uL — CL (ref 1.7–7.7)
Neutrophils Relative %: 9 %
Platelet Count: 224 10*3/uL (ref 150–400)
RBC: 3.37 MIL/uL — ABNORMAL LOW (ref 4.22–5.81)
RDW: 17.2 % — ABNORMAL HIGH (ref 11.5–15.5)
Smear Review: NORMAL
WBC Count: 1.9 10*3/uL — ABNORMAL LOW (ref 4.0–10.5)
nRBC: 0 % (ref 0.0–0.2)

## 2022-10-26 LAB — CMP (CANCER CENTER ONLY)
ALT: 16 U/L (ref 0–44)
AST: 19 U/L (ref 15–41)
Albumin: 3.3 g/dL — ABNORMAL LOW (ref 3.5–5.0)
Alkaline Phosphatase: 76 U/L (ref 38–126)
Anion gap: 4 — ABNORMAL LOW (ref 5–15)
BUN: 16 mg/dL (ref 8–23)
CO2: 25 mmol/L (ref 22–32)
Calcium: 8.4 mg/dL — ABNORMAL LOW (ref 8.9–10.3)
Chloride: 109 mmol/L (ref 98–111)
Creatinine: 0.97 mg/dL (ref 0.61–1.24)
GFR, Estimated: >60 mL/min (ref >60)
Glucose, Bld: 112 mg/dL — ABNORMAL HIGH (ref 70–99)
Potassium: 4 mmol/L (ref 3.5–5.1)
Sodium: 138 mmol/L (ref 135–145)
Total Bilirubin: 0.6 mg/dL (ref 0.3–1.2)
Total Protein: 5.8 g/dL — ABNORMAL LOW (ref 6.5–8.1)

## 2022-10-26 MED ORDER — HEPARIN SOD (PORK) LOCK FLUSH 100 UNIT/ML IV SOLN
500.0000 [IU] | Freq: Once | INTRAVENOUS | Status: AC
  Administered 2022-10-26: 500 [IU]

## 2022-10-26 MED ORDER — SODIUM CHLORIDE 0.9% FLUSH
10.0000 mL | Freq: Once | INTRAVENOUS | Status: AC
  Administered 2022-10-26: 10 mL

## 2022-10-26 NOTE — Telephone Encounter (Signed)
CRITICAL VALUE STICKER  CRITICAL VALUE:    ANC 0.2  RECEIVER (on-site recipient of call):  Daneil Dolin, LPN  DATE & TIME NOTIFIED: 10/26/2022    10:01  MESSENGER (representative from lab):  Lauren  MD NOTIFIED: Georga Kaufmann, PA-C  TIME OF NOTIFICATION:  10:02  RESPONSE:

## 2022-10-26 NOTE — Progress Notes (Signed)
[facility] [phone]   [phone]  PROGRESS NOTE  Patient Care Team: [facility] as PCP - General Sherren Kerns, MD (Inactive) as Consulting Physician (Vascular Surgery) Chilton Si, MD as Attending Physician (Cardiology) [facility]  Hematological/Oncological History # Localized Rectal Adenocarcinoma, Stage IIIc. T3N2M0 08/18/2022: presented to ED, underwent CT angio chest abdomen pelvis which showed an asymmetric left lateral rectal wall thickening involving the low rectum with punctate foci of extraluminal gas and extensive soft tissue infiltration. Findings were concerning for colorectal cancer.  08/19/2022: Flexible sigmoidoscopy revealed a partially obstructive rectal mass, biopsies were obtained. Surgery was consulted who did not recommend any acute surgical intervention, recommended pelvic MRI as well as maintaining soft stools. Biopsy most consistent with rectal adenocarcinoma.  09/10/2022: due to bowel obstruction patient underwent a laparoscopic assisted loop colostomy placement and right colectomy. Cecal perforation noted during procedure.  09/18/2022: prolonged hospitalization, d/c on 09/18/2022.  10/06/2022: Cycle 1 Day 1 of neoadjuvant FOLFOX 10/20/2022: Cycle 2 Day 1 of neoadjuvant FOLFOX HELD due to neutropenia.  10/26/2022: Cycle 3 Day 1 of neoadjuvant FOLFOX HELD due to neutropenia Interval History:  Scott [name] 72 y.o. male with medical history significant for localized rectal adenocarcinoma who presents for a follow up visit. The patient's last visit was on 10/20/2022. In the interim, we held Cycle 2 of FOLFOX due to neutropenia. He presents today with his wife to reassess for Cycle 2, Day 1 of FOLFOX chemotherapy.  On exam today Scott [name] reports he is doing well without any new or concerning symptoms. His energy is stable and he continues to complete her ADLs on his own. He has a good appetite without any  weight loss. He denies nausea, vomiting or abdominal pain. He reports good stool output from his ostomy.  He denies easy bruising or signs of bleeding. He denies any fevers, chills, sweats, shortness of breath, chest pain or cough.  A full 10 point ROS was otherwise negative.   At this time he is willing and able to proceed with neoadjuvant chemotherapy.  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    SURGICAL HISTORY: Past Surgical History:  Procedure Laterality Date   ABDOMINAL AORTIC ENDOVASCULAR STENT GRAFT  02/02/2020    type b   APPENDECTOMY     BIOPSY  08/19/2022   Procedure: BIOPSY;  Surgeon: Imogene Burn, MD;  Location: [facility] ENDOSCOPY;  Service: Gastroenterology;;   COLON RESECTION N/A 09/10/2022   Procedure: LAPAROSCOPIC ASSISTED LOOP COLOSTOMY PLACEMENT, RIGHT COLECTOMY;  Surgeon: Romie Levee, MD;  Location: [facility] ORS;  Service: General;  Laterality: N/A;   FLEXIBLE SIGMOIDOSCOPY N/A 08/19/2022   Procedure: Arnell Sieving;  Surgeon: Imogene Burn, MD;  Location: [facility] ENDOSCOPY;  Service: Gastroenterology;  Laterality: N/A;   PORTACATH PLACEMENT N/A 09/10/2022   Procedure: INSERTION PORT-A-CATH;  Surgeon: Romie Levee, MD;  Location: [facility] ORS;  Service: General;  Laterality: N/A;   SUBMUCOSAL TATTOO INJECTION  08/19/2022   Procedure: SUBMUCOSAL TATTOO INJECTION;  Surgeon: Imogene Burn, MD;  Location: [facility] ENDOSCOPY;  Service: Gastroenterology;;   THORACIC AORTIC ENDOVASCULAR STENT GRAFT N/A 02/02/2020   Procedure: REPAIR TYPE B THORACIC AORTIC ENDOVASCULAR STENT;  Surgeon: Sherren Kerns, MD;  Location: [facility] OR;  Service: Vascular;  Laterality: N/A;    SOCIAL HISTORY: Social  History   Socioeconomic History   Marital status: Married    Spouse name: Not on file   Number of children: 3    Years of education: Not on file   Highest education level: Not on file  Occupational History   Occupation: veteran  Tobacco Use   Smoking status: Former   Smokeless tobacco: Never   Tobacco comments:    Quit 2021  Vaping Use   Vaping Use: Never used  Substance and Sexual Activity   Alcohol use: Yes    Alcohol/week: 1.0 - 2.0 standard drink of alcohol    Types: 1 - 2 Cans of beer per week    Comment: 1-2 beers 1-2 times a week 08/26/21   Drug use: Yes    Types: Marijuana    Comment: used yesterday   Sexual activity: Not on file  Other Topics Concern   Not on file  Social History Narrative   Not on file   Social Determinants of Health   Financial Resource Strain: Not on file  Food Insecurity: No Food Insecurity (09/07/2022)   Hunger Vital Sign    Worried About Running Out of Food in the Last Year: Never true    Ran Out of Food in the Last Year: Never true  Transportation Needs: No Transportation Needs (09/07/2022)   PRAPARE - Administrator, Civil Service (Medical): No    Lack of Transportation (Non-Medical): No  Physical Activity: Not on file  Stress: Not on file  Social Connections: Unknown (07/09/2020)   Social Connection and Isolation Panel [NHANES]    Frequency of Communication with Friends and Family: More than three times a week    Frequency of Social Gatherings with Friends and Family: More than three times a week    Attends Religious Services: 1 to 4 times per year    Active Member of [employer] or Organizations: No    Attends Banker Meetings: 1 to 4 times per year    Marital Status: Patient declined  Intimate Partner Violence: Not At Risk (09/07/2022)   Humiliation, Afraid, Rape, and Kick questionnaire    Fear of Current or Ex-Partner: No    Emotionally Abused: No    Physically Abused: No    Sexually Abused: No    FAMILY HISTORY: Family History  Problem Relation Age of Onset   Lung cancer Sister     ALLERGIES:  is allergic to  amlodipine, carvedilol, hydralazine, valsartan, and cefotetan.  MEDICATIONS:  Current Outpatient Medications  Medication Sig Dispense Refill   empagliflozin (JARDIANCE) 25 MG TABS tablet Take 0.5 tablets by mouth every morning.     ferrous sulfate 325 (65 FE) MG EC tablet Take 1 tablet (325 mg total) by mouth daily with breakfast. 30 tablet 3   furosemide (LASIX) 40 MG tablet Take 40 mg by mouth daily as needed for edema or fluid.     hydrOXYzine (ATARAX) 25 MG tablet Take 1 tablet (25 mg total) by mouth 2 (two) times daily as needed for anxiety. 30 tablet 0   labetalol (NORMODYNE) 100 MG tablet Take 1 tablet (100 mg total) by mouth 3 (three) times daily with meals. 90 tablet 3   lidocaine-prilocaine (EMLA) cream Apply 1 Application topically as needed. 30 g 0   mirtazapine (REMERON) 7.5 MG tablet Take 1 tablet (7.5 mg total) by mouth at bedtime. 30 tablet 0   NON FORMULARY Take 1 tablet by mouth daily as needed (Constipation). Laxative. Unknown name.  ondansetron (ZOFRAN) 8 MG tablet Take 1 tablet (8 mg total) by mouth every 8 (eight) hours as needed. 30 tablet 0   oxyCODONE (OXY IR/ROXICODONE) 5 MG immediate release tablet Take 1-2 tablets (5-10 mg total) by mouth every 4 (four) hours as needed for severe pain. 30 tablet 0   polyethylene glycol (MIRALAX / GLYCOLAX) 17 g packet Take 17 g by mouth daily as needed for mild constipation or moderate constipation.     prochlorperazine (COMPAZINE) 10 MG tablet Take 1 tablet (10 mg total) by mouth every 6 (six) hours as needed for nausea or vomiting. 30 tablet 0   sacubitril-valsartan (ENTRESTO) 24-26 MG Take 0.5 tablets by mouth daily.     spironolactone (ALDACTONE) 25 MG tablet Take 0.5 tablets by mouth daily.     No current facility-administered medications for this visit.    REVIEW OF SYSTEMS:   Constitutional: ( - ) fevers, ( - )  chills , ( - ) night sweats Eyes: ( - ) blurriness of vision, ( - ) double vision, ( - ) watery eyes Ears,  nose, mouth, throat, and face: ( - ) mucositis, ( - ) sore throat Respiratory: ( - ) cough, ( - ) dyspnea, ( - ) wheezes Cardiovascular: ( - ) palpitation, ( - ) chest discomfort, ( - ) lower extremity swelling Gastrointestinal:  ( - ) nausea, ( - ) heartburn, ( - ) change in bowel habits Skin: ( - ) abnormal skin rashes Lymphatics: ( - ) new lymphadenopathy, ( - ) easy bruising Neurological: ( - ) numbness, ( - ) tingling, ( - ) new weaknesses Behavioral/Psych: ( - ) mood change, ( - ) new changes  All other systems were reviewed with the patient and are negative.  PHYSICAL EXAMINATION: ECOG PERFORMANCE STATUS: 1 - Symptomatic but completely ambulatory  Vitals:   10/26/22 0926  BP: (!) 143/84  Pulse: 92  Resp: 20  Temp: 97.7 F (36.5 C)  SpO2: 100%    Filed Weights   10/26/22 0926  Weight: 155 lb 6.4 oz (70.5 kg)     GENERAL: Well-appearing elderly Caucasian male, alert, no distress and comfortable SKIN: skin color, texture, turgor are normal, no rashes or significant lesions EYES: conjunctiva are pink and non-injected, sclera clear LUNGS: clear to auscultation and percussion with normal breathing effort HEART: regular rate & rhythm and no murmurs and no lower extremity edema Musculoskeletal: no cyanosis of digits and no clubbing  ABDOMEN: Ostomy in place, clean dry and intact.  Semiformed stool in the ostomy bag. PSYCH: alert & oriented x 3, fluent speech NEURO: no focal motor/sensory deficits  LABORATORY DATA:  I have reviewed the data as listed    Latest Ref Rng & Units 10/20/2022    9:13 AM 10/06/2022    8:43 AM 09/28/2022   12:48 PM  CBC  WBC 4.0 - 10.5 K/uL 2.2  4.7  6.2   Hemoglobin 13.0 - 17.0 g/dL 9.2  16.1  09.6   Hematocrit 39.0 - 52.0 % 28.5  32.2  32.1   Platelets 150 - 400 K/uL 138  223  315        Latest Ref Rng & Units 10/20/2022    9:13 AM 10/06/2022    8:43 AM 09/28/2022   12:48 PM  CMP  Glucose 70 - 99 mg/dL 94  045  409   BUN 8 - 23 mg/dL 16  16   22    Creatinine 0.61 - 1.24 mg/dL 8.11  9.14  0.81   Sodium 135 - 145 mmol/L 139  141  141   Potassium 3.5 - 5.1 mmol/L 4.2  4.0  3.3   Chloride 98 - 111 mmol/L 111  108  110   CO2 22 - 32 mmol/L 25  28  23    Calcium 8.9 - 10.3 mg/dL 8.3  8.8  8.8   Total Protein 6.5 - 8.1 g/dL 5.7  6.2  6.2   Total Bilirubin 0.3 - 1.2 mg/dL 0.4  0.5  0.6   Alkaline Phos 38 - 126 U/L 72  84  91   AST 15 - 41 U/L 14  14  24    ALT 0 - 44 U/L 9  15  29     RADIOGRAPHIC STUDIES: No results found.  ASSESSMENT & PLAN Scott [name] 72 y.o. male with medical history significant for localized rectal adenocarcinoma who presents for a follow up visit.  # Localized Rectal Adenocarcinoma, Stage IIIc. T3N2M0 --will plan to pursue TNT with chemotherapy followed by chemoradiation. Surgery would be after the upfront treatments.  -- will proceed with 8 cycles of FOLFOX chemotherapy, with Xeloda for chemoradiation. --patient has established care with Dr. Romie Levee in Surgery and Dr. Mitzi Hansen in radiation oncology --Started Cycle 1, Day 1 of neoadjvuant FOLFOX on 10/06/2022. Plan: --today is Cycle 2 Day 1 of neoadjuvant FOLFOX.  --labs today show WBC 1.9, Hgb 9.9, Plt 224. ANC 200. Creatinine and LFTs normal.  --Will HOLD treatment today due to worsening neutropenia. Provided neutropenic precautions including monitoring for fevers, avoid sick contact, washing hands, avoid large crowds and wearing a mask. --Discussed case with Dr. Candise Che who recommends GCSF injection on Day 3 with future treatments.  -- RTC in 1 week with labs, follow up visit before cycle 2, day 1  # Normocytic anemia: #Iron deficiency anemia: --Hgb is 9.9, MCV 92.9 --Labs during hospitalization on 08/18/2022 showed iron deficiency with ferritin of 18. No  evidence of folate or B12 deficiency --Repeat iron panel shows iron deficiency with serum iron 36, saturation 11%, ferritin pending.  --Recommend ferrous sulfate 325 mg once daily with a source of vitamin  C. Patient has yet to pick up prescription but will so today.   #Supportive Care -- chemotherapy education complete -- port placed by surgery -- zofran 8mg  q8H PRN and compazine 10mg  PO q6H for nausea -- EMLA cream for port -- no pain medication required at this time.   No orders of the defined types were placed in this encounter.  All questions were answered. The patient knows to call the clinic with any problems, questions or concerns. I have spent a total of 30 minutes minutes of face-to-face and non-face-to-face time, preparing to see the patient, performing a medically appropriate examination, counseling and educating the patient,documenting clinical information in the electronic health record,  and care coordination.   Georga Kaufmann PA-C Dept of Hematology and Oncology [facility] Phone: [phone]   10/26/2022 9:37 AM

## 2022-10-27 ENCOUNTER — Ambulatory Visit: Payer: Medicare Other

## 2022-11-02 ENCOUNTER — Inpatient Hospital Stay (HOSPITAL_BASED_OUTPATIENT_CLINIC_OR_DEPARTMENT_OTHER): Payer: Medicare Other | Admitting: Physician Assistant

## 2022-11-02 ENCOUNTER — Inpatient Hospital Stay: Payer: Medicare Other

## 2022-11-02 ENCOUNTER — Telehealth: Payer: Self-pay | Admitting: Physician Assistant

## 2022-11-02 ENCOUNTER — Other Ambulatory Visit: Payer: Self-pay

## 2022-11-02 VITALS — BP 150/102 | HR 102 | Temp 98.1°F | Resp 18 | Ht 66.0 in | Wt 154.7 lb

## 2022-11-02 DIAGNOSIS — I10 Essential (primary) hypertension: Secondary | ICD-10-CM | POA: Diagnosis not present

## 2022-11-02 DIAGNOSIS — Z79899 Other long term (current) drug therapy: Secondary | ICD-10-CM | POA: Diagnosis not present

## 2022-11-02 DIAGNOSIS — C20 Malignant neoplasm of rectum: Secondary | ICD-10-CM | POA: Diagnosis not present

## 2022-11-02 DIAGNOSIS — D701 Agranulocytosis secondary to cancer chemotherapy: Secondary | ICD-10-CM | POA: Diagnosis not present

## 2022-11-02 DIAGNOSIS — R110 Nausea: Secondary | ICD-10-CM | POA: Diagnosis not present

## 2022-11-02 DIAGNOSIS — D509 Iron deficiency anemia, unspecified: Secondary | ICD-10-CM | POA: Diagnosis not present

## 2022-11-02 DIAGNOSIS — Z5111 Encounter for antineoplastic chemotherapy: Secondary | ICD-10-CM

## 2022-11-02 DIAGNOSIS — Z87891 Personal history of nicotine dependence: Secondary | ICD-10-CM | POA: Diagnosis not present

## 2022-11-02 DIAGNOSIS — Z95828 Presence of other vascular implants and grafts: Secondary | ICD-10-CM

## 2022-11-02 DIAGNOSIS — Z801 Family history of malignant neoplasm of trachea, bronchus and lung: Secondary | ICD-10-CM | POA: Diagnosis not present

## 2022-11-02 DIAGNOSIS — T451X5A Adverse effect of antineoplastic and immunosuppressive drugs, initial encounter: Secondary | ICD-10-CM | POA: Diagnosis not present

## 2022-11-02 DIAGNOSIS — Z9049 Acquired absence of other specified parts of digestive tract: Secondary | ICD-10-CM | POA: Diagnosis not present

## 2022-11-02 LAB — CMP (CANCER CENTER ONLY)
ALT: 9 U/L (ref 0–44)
AST: 12 U/L — ABNORMAL LOW (ref 15–41)
Albumin: 3.7 g/dL (ref 3.5–5.0)
Alkaline Phosphatase: 85 U/L (ref 38–126)
Anion gap: 5 (ref 5–15)
BUN: 18 mg/dL (ref 8–23)
CO2: 27 mmol/L (ref 22–32)
Calcium: 8.9 mg/dL (ref 8.9–10.3)
Chloride: 108 mmol/L (ref 98–111)
Creatinine: 1.07 mg/dL (ref 0.61–1.24)
GFR, Estimated: >60 mL/min (ref >60)
Glucose, Bld: 111 mg/dL — ABNORMAL HIGH (ref 70–99)
Potassium: 4.2 mmol/L (ref 3.5–5.1)
Sodium: 140 mmol/L (ref 135–145)
Total Bilirubin: 0.6 mg/dL (ref 0.3–1.2)
Total Protein: 6.2 g/dL — ABNORMAL LOW (ref 6.5–8.1)

## 2022-11-02 LAB — CBC WITH DIFFERENTIAL (CANCER CENTER ONLY)
Abs Immature Granulocytes: 0.04 10*3/uL (ref 0.00–0.07)
Basophils Absolute: 0.1 10*3/uL (ref 0.0–0.1)
Basophils Relative: 1 %
Eosinophils Absolute: 0 10*3/uL (ref 0.0–0.5)
Eosinophils Relative: 0 %
HCT: 32.9 % — ABNORMAL LOW (ref 39.0–52.0)
Hemoglobin: 10.8 g/dL — ABNORMAL LOW (ref 13.0–17.0)
Immature Granulocytes: 1 %
Lymphocytes Relative: 37 %
Lymphs Abs: 2 10*3/uL (ref 0.7–4.0)
MCH: 29.4 pg (ref 26.0–34.0)
MCHC: 32.8 g/dL (ref 30.0–36.0)
MCV: 89.6 fL (ref 80.0–100.0)
Monocytes Absolute: 0.8 10*3/uL (ref 0.1–1.0)
Monocytes Relative: 14 %
Neutro Abs: 2.6 10*3/uL (ref 1.7–7.7)
Neutrophils Relative %: 47 %
Platelet Count: 250 10*3/uL (ref 150–400)
RBC: 3.67 MIL/uL — ABNORMAL LOW (ref 4.22–5.81)
RDW: 17.1 % — ABNORMAL HIGH (ref 11.5–15.5)
WBC Count: 5.6 10*3/uL (ref 4.0–10.5)
nRBC: 0 % (ref 0.0–0.2)

## 2022-11-02 MED ORDER — SODIUM CHLORIDE 0.9% FLUSH
10.0000 mL | Freq: Once | INTRAVENOUS | Status: AC
  Administered 2022-11-02: 10 mL

## 2022-11-02 MED ORDER — HEPARIN SOD (PORK) LOCK FLUSH 100 UNIT/ML IV SOLN
500.0000 [IU] | Freq: Once | INTRAVENOUS | Status: AC
  Administered 2022-11-02: 500 [IU]

## 2022-11-02 NOTE — Progress Notes (Signed)
[facility] [phone]   [phone]  PROGRESS NOTE  Patient Care Team: [facility] as PCP - General Sherren Kerns, MD (Inactive) as Consulting Physician (Vascular Surgery) Chilton Si, MD as Attending Physician (Cardiology) [facility]  Hematological/Oncological History # Localized Rectal Adenocarcinoma, Stage IIIc. T3N2M0 08/18/2022: presented to ED, underwent CT angio chest abdomen pelvis which showed an asymmetric left lateral rectal wall thickening involving the low rectum with punctate foci of extraluminal gas and extensive soft tissue infiltration. Findings were concerning for colorectal cancer.  08/19/2022: Flexible sigmoidoscopy revealed a partially obstructive rectal mass, biopsies were obtained. Surgery was consulted who did not recommend any acute surgical intervention, recommended pelvic MRI as well as maintaining soft stools. Biopsy most consistent with rectal adenocarcinoma.  09/10/2022: due to bowel obstruction patient underwent a laparoscopic assisted loop colostomy placement and right colectomy. Cecal perforation noted during procedure.  09/18/2022: prolonged hospitalization, d/c on 09/18/2022.  10/06/2022: Cycle 1 Day 1 of neoadjuvant FOLFOX 10/20/2022: Cycle 2 Day 1 of neoadjuvant FOLFOX HELD due to neutropenia.  10/26/2022: Cycle 2 Day 1 of neoadjuvant FOLFOX HELD due to neutropenia 11/04/2022: Planned Cycle Cycle 2, Day 1 of neoadjuvant FOLFOX (added Udenyca injection on Day 3)  Interval History:  Scott [name] 72 y.o. male with medical history significant for localized rectal adenocarcinoma who presents for a follow up visit. The patient's last visit was on 10/26/2022. In the interim, we held Cycle 2 of FOLFOX due to neutropenia. He presents today with his wife to reassess for Cycle 2, Day 1 of FOLFOX chemotherapy.  On exam today Scott [name] reports no changes to his health. He is eating well and weight is stable.  He denies any changes to his energy levels. . He denies nausea, vomiting or abdominal pain. He reports stable output of stool from his ostomy.  He denies easy bruising or signs of bleeding. He denies any fevers, chills, sweats, shortness of breath, chest pain or cough.  A full 10 point ROS was otherwise negative.   At this time he is willing and able to proceed with neoadjuvant chemotherapy.  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    SURGICAL HISTORY: Past Surgical History:  Procedure Laterality Date   ABDOMINAL AORTIC ENDOVASCULAR STENT GRAFT  02/02/2020    type b   APPENDECTOMY     BIOPSY  08/19/2022   Procedure: BIOPSY;  Surgeon: Imogene Burn, MD;  Location: [facility] ENDOSCOPY;  Service: Gastroenterology;;   COLON RESECTION N/A 09/10/2022   Procedure: LAPAROSCOPIC ASSISTED LOOP COLOSTOMY PLACEMENT, RIGHT COLECTOMY;  Surgeon: Romie Levee, MD;  Location: [facility] ORS;  Service: General;  Laterality: N/A;   FLEXIBLE SIGMOIDOSCOPY N/A 08/19/2022   Procedure: Arnell Sieving;  Surgeon: Imogene Burn, MD;  Location: [facility] ENDOSCOPY;  Service: Gastroenterology;  Laterality: N/A;   PORTACATH PLACEMENT N/A 09/10/2022   Procedure: INSERTION PORT-A-CATH;  Surgeon: Romie Levee, MD;  Location: [facility] ORS;  Service: General;  Laterality: N/A;   SUBMUCOSAL TATTOO INJECTION  08/19/2022   Procedure: SUBMUCOSAL TATTOO INJECTION;  Surgeon: Imogene Burn, MD;  Location: [facility] ENDOSCOPY;  Service: Gastroenterology;;   THORACIC AORTIC ENDOVASCULAR STENT GRAFT N/A 02/02/2020   Procedure: REPAIR TYPE B THORACIC AORTIC ENDOVASCULAR STENT;  Surgeon: Sherren Kerns, MD;  Location: [facility] OR;  Service: Vascular;  Laterality:  N/A;    SOCIAL HISTORY: Social History   Socioeconomic History   Marital status: Married    Spouse name:  Not on file   Number of children: 3   Years of education: Not on file   Highest education level: Not on file  Occupational History   Occupation: veteran  Tobacco Use   Smoking status: Former   Smokeless tobacco: Never   Tobacco comments:    Quit 2021  Vaping Use   Vaping Use: Never used  Substance and Sexual Activity   Alcohol use: Yes    Alcohol/week: 1.0 - 2.0 standard drink of alcohol    Types: 1 - 2 Cans of beer per week    Comment: 1-2 beers 1-2 times a week 08/26/21   Drug use: Yes    Types: Marijuana    Comment: used yesterday   Sexual activity: Not on file  Other Topics Concern   Not on file  Social History Narrative   Not on file   Social Determinants of Health   Financial Resource Strain: Not on file  Food Insecurity: No Food Insecurity (09/07/2022)   Hunger Vital Sign    Worried About Running Out of Food in the Last Year: Never true    Ran Out of Food in the Last Year: Never true  Transportation Needs: No Transportation Needs (09/07/2022)   PRAPARE - Administrator, Civil Service (Medical): No    Lack of Transportation (Non-Medical): No  Physical Activity: Not on file  Stress: Not on file  Social Connections: Unknown (07/09/2020)   Social Connection and Isolation Panel [NHANES]    Frequency of Communication with Friends and Family: More than three times a week    Frequency of Social Gatherings with Friends and Family: More than three times a week    Attends Religious Services: 1 to 4 times per year    Active Member of [employer] or Organizations: No    Attends Banker Meetings: 1 to 4 times per year    Marital Status: Patient declined  Intimate Partner Violence: Not At Risk (09/07/2022)   Humiliation, Afraid, Rape, and Kick questionnaire    Fear of Current or Ex-Partner: No    Emotionally Abused: No    Physically Abused: No    Sexually Abused: No    FAMILY HISTORY: Family History  Problem Relation Age of Onset   Lung cancer Sister      ALLERGIES:  is allergic to amlodipine, carvedilol, hydralazine, valsartan, and cefotetan.  MEDICATIONS:  Current Outpatient Medications  Medication Sig Dispense Refill   empagliflozin (JARDIANCE) 25 MG TABS tablet Take 0.5 tablets by mouth every morning.     ferrous sulfate 325 (65 FE) MG EC tablet Take 1 tablet (325 mg total) by mouth daily with breakfast. 30 tablet 3   furosemide (LASIX) 40 MG tablet Take 40 mg by mouth daily as needed for edema or fluid.     hydrOXYzine (ATARAX) 25 MG tablet Take 1 tablet (25 mg total) by mouth 2 (two) times daily as needed for anxiety. 30 tablet 0   labetalol (NORMODYNE) 100 MG tablet Take 1 tablet (100 mg total) by mouth 3 (three) times daily with meals. 90 tablet 3   lidocaine-prilocaine (EMLA) cream Apply 1 Application topically as needed. 30 g 0   mirtazapine (REMERON) 7.5 MG tablet Take 1 tablet (7.5 mg total) by mouth at bedtime. 30 tablet 0   NON FORMULARY Take 1 tablet by mouth daily as needed (Constipation).  Laxative. Unknown name.     ondansetron (ZOFRAN) 8 MG tablet Take 1 tablet (8 mg total) by mouth every 8 (eight) hours as needed. 30 tablet 0   oxyCODONE (OXY IR/ROXICODONE) 5 MG immediate release tablet Take 1-2 tablets (5-10 mg total) by mouth every 4 (four) hours as needed for severe pain. 30 tablet 0   polyethylene glycol (MIRALAX / GLYCOLAX) 17 g packet Take 17 g by mouth daily as needed for mild constipation or moderate constipation.     prochlorperazine (COMPAZINE) 10 MG tablet Take 1 tablet (10 mg total) by mouth every 6 (six) hours as needed for nausea or vomiting. 30 tablet 0   sacubitril-valsartan (ENTRESTO) 24-26 MG Take 0.5 tablets by mouth daily.     spironolactone (ALDACTONE) 25 MG tablet Take 0.5 tablets by mouth daily.     No current facility-administered medications for this visit.    REVIEW OF SYSTEMS:   Constitutional: ( - ) fevers, ( - )  chills , ( - ) night sweats Eyes: ( - ) blurriness of vision, ( - ) double  vision, ( - ) watery eyes Ears, nose, mouth, throat, and face: ( - ) mucositis, ( - ) sore throat Respiratory: ( - ) cough, ( - ) dyspnea, ( - ) wheezes Cardiovascular: ( - ) palpitation, ( - ) chest discomfort, ( - ) lower extremity swelling Gastrointestinal:  ( - ) nausea, ( - ) heartburn, ( - ) change in bowel habits Skin: ( - ) abnormal skin rashes Lymphatics: ( - ) new lymphadenopathy, ( - ) easy bruising Neurological: ( - ) numbness, ( - ) tingling, ( - ) new weaknesses Behavioral/Psych: ( - ) mood change, ( - ) new changes  All other systems were reviewed with the patient and are negative.  PHYSICAL EXAMINATION: ECOG PERFORMANCE STATUS: 1 - Symptomatic but completely ambulatory  Vitals:   10/26/22 0926  BP: (!) 143/84  Pulse: 92  Resp: 20  Temp: 97.7 F (36.5 C)  SpO2: 100%    Filed Weights   10/26/22 0926  Weight: 155 lb 6.4 oz (70.5 kg)     GENERAL: Well-appearing elderly Caucasian male, alert, no distress and comfortable SKIN: skin color, texture, turgor are normal, no rashes or significant lesions EYES: conjunctiva are pink and non-injected, sclera clear LUNGS: clear to auscultation and percussion with normal breathing effort HEART: regular rate & rhythm and no murmurs and no lower extremity edema Musculoskeletal: no cyanosis of digits and no clubbing  ABDOMEN: Ostomy in place, clean dry and intact.  Semiformed stool in the ostomy bag. PSYCH: alert & oriented x 3, fluent speech NEURO: no focal motor/sensory deficits  LABORATORY DATA:  I have reviewed the data as listed    Latest Ref Rng & Units 10/20/2022    9:13 AM 10/06/2022    8:43 AM 09/28/2022   12:48 PM  CBC  WBC 4.0 - 10.5 K/uL 2.2  4.7  6.2   Hemoglobin 13.0 - 17.0 g/dL 9.2  16.1  09.6   Hematocrit 39.0 - 52.0 % 28.5  32.2  32.1   Platelets 150 - 400 K/uL 138  223  315        Latest Ref Rng & Units 10/20/2022    9:13 AM 10/06/2022    8:43 AM 09/28/2022   12:48 PM  CMP  Glucose 70 - 99 mg/dL 94  045   409   BUN 8 - 23 mg/dL 16  16  22    Creatinine  0.61 - 1.24 mg/dL 1.61  0.96  0.45   Sodium 135 - 145 mmol/L 139  141  141   Potassium 3.5 - 5.1 mmol/L 4.2  4.0  3.3   Chloride 98 - 111 mmol/L 111  108  110   CO2 22 - 32 mmol/L 25  28  23    Calcium 8.9 - 10.3 mg/dL 8.3  8.8  8.8   Total Protein 6.5 - 8.1 g/dL 5.7  6.2  6.2   Total Bilirubin 0.3 - 1.2 mg/dL 0.4  0.5  0.6   Alkaline Phos 38 - 126 U/L 72  84  91   AST 15 - 41 U/L 14  14  24    ALT 0 - 44 U/L 9  15  29     RADIOGRAPHIC STUDIES: No results found.  ASSESSMENT & PLAN Scott [name] 72 y.o. male with medical history significant for localized rectal adenocarcinoma who presents for a follow up visit.  # Localized Rectal Adenocarcinoma, Stage IIIc. T3N2M0 --will plan to pursue TNT with chemotherapy followed by chemoradiation. Surgery would be after the upfront treatments.  -- will proceed with 8 cycles of FOLFOX chemotherapy, with Xeloda for chemoradiation. --patient has established care with Dr. Romie Levee in Surgery and Dr. Mitzi Hansen in radiation oncology --Started Cycle 1, Day 1 of neoadjvuant FOLFOX on 10/06/2022. Plan: --Due for Cycle 2 Day 1 of neoadjuvant FOLFOX.  --labs today show WBC 5.6, Hgb 10.8, Plt 250. ANC 2600. Creatinine and LFTs normal.  --Proceed with planned Cycle 2. Patient requested to move tomorrow's infusion to another day as he has conflicting appts at the [place]. We have moved his infusion to 11/04/2022.  --Due to history of neutropenia after cycle 2, we have added GCSF injection on Day 3 with future treatments.  -- RTC in 2 week with labs, follow up visit before cycle 3, day 1  # Normocytic anemia: #Iron deficiency anemia: --Hgb is 10.8, MCV 89.6 --Labs during hospitalization on 08/18/2022 showed iron deficiency with ferritin of 18. No  evidence of folate or B12 deficiency --Repeat iron panel shows iron deficiency with serum iron 36, saturation 11%, ferritin pending.  --Recommend ferrous sulfate 325 mg once  daily with a source of vitamin C.  #Supportive Care -- chemotherapy education complete -- port placed by surgery -- zofran 8mg  q8H PRN and compazine 10mg  PO q6H for nausea -- EMLA cream for port -- no pain medication required at this time.   No orders of the defined types were placed in this encounter.  All questions were answered. The patient knows to call the clinic with any problems, questions or concerns. I have spent a total of 30 minutes minutes of face-to-face and non-face-to-face time, preparing to see the patient, performing a medically appropriate examination, counseling and educating the patient,documenting clinical information in the electronic health record,  and care coordination.   Georga Kaufmann PA-C Dept of Hematology and Oncology [facility] Phone: [phone]   10/26/2022 9:37 AM

## 2022-11-03 ENCOUNTER — Ambulatory Visit: Payer: Medicare Other | Admitting: Hematology and Oncology

## 2022-11-03 ENCOUNTER — Inpatient Hospital Stay: Payer: Medicare Other

## 2022-11-03 ENCOUNTER — Ambulatory Visit: Payer: Medicare Other

## 2022-11-03 ENCOUNTER — Other Ambulatory Visit: Payer: Medicare Other

## 2022-11-03 ENCOUNTER — Other Ambulatory Visit: Payer: Self-pay | Admitting: Hematology and Oncology

## 2022-11-03 DIAGNOSIS — C20 Malignant neoplasm of rectum: Secondary | ICD-10-CM

## 2022-11-04 ENCOUNTER — Inpatient Hospital Stay: Payer: Medicare Other

## 2022-11-04 ENCOUNTER — Telehealth: Payer: Self-pay | Admitting: Physician Assistant

## 2022-11-06 ENCOUNTER — Inpatient Hospital Stay: Payer: Medicare Other

## 2022-11-08 ENCOUNTER — Ambulatory Visit: Payer: Medicare Other

## 2022-11-08 ENCOUNTER — Ambulatory Visit: Payer: Medicare Other | Admitting: Physician Assistant

## 2022-11-08 ENCOUNTER — Inpatient Hospital Stay: Payer: Medicare Other

## 2022-11-08 ENCOUNTER — Other Ambulatory Visit: Payer: Medicare Other

## 2022-11-08 NOTE — Progress Notes (Signed)
Nutrition  Patient did not show up for nutrition appointment.  Rescheduled patient to be seen by RD during infusion on 6/12 (Wednesday)  Scott [name] B. Freida Busman, RD, LDN Registered Dietitian [phone]

## 2022-11-09 ENCOUNTER — Ambulatory Visit: Payer: Medicare Other | Attending: Internal Medicine | Admitting: Internal Medicine

## 2022-11-09 ENCOUNTER — Ambulatory Visit: Payer: Medicare Other

## 2022-11-09 ENCOUNTER — Other Ambulatory Visit: Payer: Medicare Other

## 2022-11-09 ENCOUNTER — Encounter: Payer: Medicare Other | Admitting: Nutrition

## 2022-11-09 ENCOUNTER — Ambulatory Visit: Payer: Medicare Other | Admitting: Physician Assistant

## 2022-11-12 ENCOUNTER — Encounter: Payer: Self-pay | Admitting: Internal Medicine

## 2022-11-14 ENCOUNTER — Other Ambulatory Visit: Payer: Self-pay | Admitting: Hematology and Oncology

## 2022-11-15 ENCOUNTER — Encounter: Payer: Self-pay | Admitting: Hematology and Oncology

## 2022-11-18 ENCOUNTER — Ambulatory Visit: Payer: Medicare Other

## 2022-11-18 ENCOUNTER — Ambulatory Visit: Payer: Medicare Other | Admitting: Hematology and Oncology

## 2022-11-18 ENCOUNTER — Other Ambulatory Visit: Payer: Medicare Other

## 2022-11-20 ENCOUNTER — Inpatient Hospital Stay: Payer: Medicare Other

## 2022-11-22 ENCOUNTER — Ambulatory Visit: Payer: Medicare Other | Admitting: Hematology and Oncology

## 2022-12-01 ENCOUNTER — Other Ambulatory Visit: Payer: Medicare Other

## 2022-12-01 ENCOUNTER — Ambulatory Visit: Payer: Medicare Other

## 2022-12-01 ENCOUNTER — Encounter: Payer: Medicare Other | Admitting: Dietician

## 2022-12-01 ENCOUNTER — Ambulatory Visit: Payer: Medicare Other | Admitting: Hematology and Oncology

## 2022-12-06 ENCOUNTER — Ambulatory Visit: Payer: Medicare Other | Attending: Radiation Oncology

## 2023-01-27 ENCOUNTER — Ambulatory Visit: Payer: Medicare Other | Admitting: Radiation Oncology

## 2023-01-27 ENCOUNTER — Telehealth: Payer: Self-pay | Admitting: Radiation Oncology

## 2023-01-27 ENCOUNTER — Ambulatory Visit: Payer: Medicare Other

## 2023-01-27 ENCOUNTER — Ambulatory Visit
Admission: RE | Admit: 2023-01-27 | Discharge: 2023-01-27 | Disposition: A | Discharge status: Home / Self Care | Payer: Medicare Other | Source: Ambulatory Visit | Attending: Radiation Oncology | Admitting: Radiation Oncology

## 2023-01-27 DIAGNOSIS — C20 Malignant neoplasm of rectum: Secondary | ICD-10-CM

## 2023-01-27 NOTE — Telephone Encounter (Signed)
LVM to r/s missed appt with PA [name] and CT [facility]

## 2023-02-01 ENCOUNTER — Other Ambulatory Visit: Payer: Self-pay

## 2023-02-03 ENCOUNTER — Ambulatory Visit
Admission: RE | Admit: 2023-02-03 | Discharge: 2023-02-03 | Disposition: A | Discharge status: Home / Self Care | Payer: Medicare Other | Source: Ambulatory Visit | Attending: Radiation Oncology | Admitting: Radiation Oncology

## 2023-02-03 ENCOUNTER — Encounter: Payer: Self-pay | Admitting: Radiation Oncology

## 2023-02-03 VITALS — BP 95/62 | HR 106 | Temp 97.4°F | Resp 20 | Ht 66.0 in | Wt 146.0 lb

## 2023-02-03 DIAGNOSIS — F1290 Cannabis use, unspecified, uncomplicated: Secondary | ICD-10-CM | POA: Diagnosis not present

## 2023-02-03 DIAGNOSIS — Z87891 Personal history of nicotine dependence: Secondary | ICD-10-CM | POA: Diagnosis not present

## 2023-02-03 DIAGNOSIS — Z7984 Long term (current) use of oral hypoglycemic drugs: Secondary | ICD-10-CM | POA: Diagnosis not present

## 2023-02-03 DIAGNOSIS — C189 Malignant neoplasm of colon, unspecified: Secondary | ICD-10-CM

## 2023-02-03 DIAGNOSIS — Z79899 Other long term (current) drug therapy: Secondary | ICD-10-CM | POA: Diagnosis not present

## 2023-02-03 DIAGNOSIS — I7121 Aneurysm of the ascending aorta, without rupture: Secondary | ICD-10-CM | POA: Diagnosis not present

## 2023-02-03 DIAGNOSIS — I10 Essential (primary) hypertension: Secondary | ICD-10-CM | POA: Insufficient documentation

## 2023-02-03 DIAGNOSIS — Z801 Family history of malignant neoplasm of trachea, bronchus and lung: Secondary | ICD-10-CM | POA: Diagnosis not present

## 2023-02-03 DIAGNOSIS — C184 Malignant neoplasm of transverse colon: Secondary | ICD-10-CM | POA: Diagnosis not present

## 2023-02-03 DIAGNOSIS — C20 Malignant neoplasm of rectum: Secondary | ICD-10-CM | POA: Diagnosis not present

## 2023-02-03 NOTE — Progress Notes (Signed)
Nursing interview for Colon cancer w/ a HX of Rectal cancer. Patient identity verified x2.  Patient reports doing well. No discomfort conveyed at this time.  Meaningful use complete.  Vitals- BP 95/62 (BP Location: Left Arm, Patient Position: Sitting, Cuff Size: Normal)   Pulse (!) 106   Temp (!) 97.4 F (36.3 C) (Temporal)   Resp 20   Ht 5\' 6"  (1.676 m)   Wt 146 lb (66.2 kg)   SpO2 100%   BMI 23.57 kg/m   This concludes the interaction.  Ruel Favors, LPN

## 2023-02-03 NOTE — Progress Notes (Addendum)
Radiation Oncology         [phone] ________________________________  Name: Scott [name]        MRN: [id number]  Date of Service: 02/03/2023 DOB: 1951/05/27  MW:NUUVOZ, [name], MD     REFERRING PHYSICIAN: Jaci Standard, MD   DIAGNOSIS: The primary encounter diagnosis was Malignant neoplasm of colon, unspecified part of colon ([name]). A diagnosis of Rectal cancer [facility]) was also pertinent to this visit.   HISTORY OF PRESENT ILLNESS: Scott [name] is a 72 y.o. male with a diagnosis of rectal cancer.  The patient originally presented to the hospital in October 2023 with shortness of breath.  No focal findings were identified at that time.  He returned with symptoms in February 2024 and underwent a CT angiography of the chest abdomen and pelvis to rule out dissection.  No evidence of dissection was appreciated but there was a stable 4.1 cm ascending aortic aneurysm noted.  There was an interval development of tree-in-bud peribronchial nodularity within the right upper lobe, and asymmetric left lateral wall thickening involving a low lying tumor in the rectum with punctate foci of extraluminal gas and extensive soft tissue infiltration in the left perirectal soft tissues.  He underwent flexible sigmoidoscopy and a large amount of stool was seen in the sigmoid colon interfering with visualization.  A mass was seen in the rectum 5 cm in length.  A specimen was obtained consistent with an adenoma with high-grade dysplasia.  CEA on 08/20/22 was 32.6. He returned for repeat assessment and biopsies on 09/01/2022 which did show adenocarcinoma.  An MRI of the pelvis also on 08/25/2022 showed what appeared to be a bulky tumor 7 cm from the anal verge extending in the mid to high rectum and it extended beyond the muscularis and into the perirectal fat, tumor obstructs the rectum at that level.  The measurement overall was 4.5 cm excluding the plaque-like thickening that extended along the left  lateral rectal wall, it was classified as T3.  This extended beyond the mesorectum involving the pelvic floor and towards the pelvic sidewall involving the neurovascular structures extending 6.4 cm along the left lateral rectum.  A gas tract extends along the wall of the rectum concerning for developing tumor related sinus tract.  N2 disease involving the mesorectum was also noted.  Initially it was felt that he did not need any surgical intervention, but returned to the hospital on 09/07/2022 with symptoms concerning for obstruction.  A distal obstruction was seen by CT on 09/07/2022 and he underwent loop colostomy for diversion on 09/10/2022 with Dr. Maisie Fus.  He had a prolonged recovery, but began total neoadjuvant chemotherapy on 10/06/22 with FOLFOX. He relocated his care back to the [place] due to insurance coverage, and resumed treatment in May 2024. He is due to finish Cycle 8 of FOLFOX on 02/07/23. He's seen today to discuss treatment with radiotherapy.    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       PAST SURGICAL HISTORY: Past Surgical History:  Procedure Laterality Date   ABDOMINAL AORTIC ENDOVASCULAR STENT GRAFT  02/02/2020    type b   APPENDECTOMY     BIOPSY  08/19/2022   Procedure: BIOPSY;  Surgeon: Imogene Burn,  MD;  Location: [facility] ENDOSCOPY;  Service: Gastroenterology;;   COLON RESECTION N/A 09/10/2022   Procedure: LAPAROSCOPIC ASSISTED LOOP COLOSTOMY PLACEMENT, RIGHT COLECTOMY;  Surgeon: Romie Levee, MD;  Location: [facility] ORS;  Service: General;  Laterality: N/A;   FLEXIBLE SIGMOIDOSCOPY N/A 08/19/2022   Procedure: Arnell Sieving;  Surgeon: Imogene Burn, MD;  Location[facility] ENDOSCOPY;  Service: Gastroenterology;  Laterality: N/A;   PORTACATH PLACEMENT  N/A 09/10/2022   Procedure: INSERTION PORT-A-CATH;  Surgeon: Romie Levee, MD;  Location: [facility] ORS;  Service: General;  Laterality: N/A;   SUBMUCOSAL TATTOO INJECTION  08/19/2022   Procedure: SUBMUCOSAL TATTOO INJECTION;  Surgeon: Imogene Burn, MD;  Location: [facility] ENDOSCOPY;  Service: Gastroenterology;;   THORACIC AORTIC ENDOVASCULAR STENT GRAFT N/A 02/02/2020   Procedure: REPAIR TYPE B THORACIC AORTIC ENDOVASCULAR STENT;  Surgeon: Sherren Kerns, MD;  Location: [facility] OR;  Service: Vascular;  Laterality: N/A;     FAMILY HISTORY:  Family History  Problem Relation Age of Onset   Lung cancer Sister      SOCIAL HISTORY:  reports that he has quit smoking. He has never used smokeless tobacco. He reports current alcohol use of about 1.0 - 2.0 standard drink of alcohol per week. He reports current drug use. Drug: Marijuana.  The patient is married and lives in [place]. He used to manage a restaurant.    ALLERGIES: Amlodipine, Carvedilol, Hydralazine, Valsartan, and Cefotetan   MEDICATIONS:  Current Outpatient Medications  Medication Sig Dispense Refill   empagliflozin (JARDIANCE) 25 MG TABS tablet Take 0.5 tablets by mouth every morning.     ferrous sulfate 325 (65 FE) MG EC tablet Take 1 tablet (325 mg total) by mouth daily with breakfast. 30 tablet 3   furosemide (LASIX) 40 MG tablet Take 40 mg by mouth daily as needed for edema or fluid.     hydrOXYzine (ATARAX) 25 MG tablet Take 1 tablet (25 mg total) by mouth 2 (two) times daily as needed for anxiety. 30 tablet 0   labetalol (NORMODYNE) 100 MG tablet Take 1 tablet (100 mg total) by mouth 3 (three) times daily with meals. 90 tablet 3   lidocaine-prilocaine (EMLA) cream Apply 1 Application topically as needed. 30 g 0   mirtazapine (REMERON) 7.5 MG tablet TAKE 1 TABLET BY MOUTH DAILY AT BEDTIME 30 tablet 0   NON FORMULARY Take 1 tablet by mouth daily as needed (Constipation). Laxative. Unknown name.     ondansetron (ZOFRAN) 8 MG tablet  Take 1 tablet (8 mg total) by mouth every 8 (eight) hours as needed. 30 tablet 0   oxyCODONE (OXY IR/ROXICODONE) 5 MG immediate release tablet Take 1-2 tablets (5-10 mg total) by mouth every 4 (four) hours as needed for severe pain. 30 tablet 0   polyethylene glycol (MIRALAX / GLYCOLAX) 17 g packet Take 17 g by mouth daily as needed for mild constipation or moderate constipation.     prochlorperazine (COMPAZINE) 10 MG tablet Take 1 tablet (10 mg total) by mouth every 6 (six) hours as needed for nausea or vomiting. 30 tablet 0   sacubitril-valsartan (ENTRESTO) 24-26 MG Take 0.5 tablets by mouth daily.     spironolactone (ALDACTONE) 25 MG tablet Take 0.5 tablets by mouth daily.     No current facility-administered medications for this encounter.     REVIEW OF SYSTEMS: On review of systems, the patient reports that he is doing pretty well/ other than fatigue from chemotherapy he states he feels very well. He denies rectal bleeding, pelvic pain  or fullness in the pelvis. He has small amounts of mucous per rectum but is managing well with his ostomy. No other complaints are verbalized.      PHYSICAL EXAM:  Wt Readings from Last 3 Encounters:  02/03/23 146 lb (66.2 kg)  11/02/22 154 lb 11.2 oz (70.2 kg)  10/26/22 155 lb 6.4 oz (70.5 kg)   Temp Readings from Last 3 Encounters:  02/03/23 (!) 97.4 F (36.3 C) (Temporal)  11/02/22 98.1 F (36.7 C) (Temporal)  10/26/22 97.7 F (36.5 C) (Temporal)   BP Readings from Last 3 Encounters:  02/03/23 95/62  11/02/22 (!) 150/102  10/26/22 (!) 143/84   Pulse Readings from Last 3 Encounters:  02/03/23 (!) 106  11/02/22 (!) 102  10/26/22 92   Pain Assessment Pain Score: 0-No pain/10  In general this is a well appearing caucasian male in no acute distress. He's alert and oriented x4 and appropriate throughout the examination. Cardiopulmonary assessment is negative for acute distress and he exhibits normal effort.      ECOG = 1  0 -  Asymptomatic (Fully active, able to carry on all predisease activities without restriction)  1 - Symptomatic but completely ambulatory (Restricted in physically strenuous activity but ambulatory and able to carry out work of a light or sedentary nature. For example, light housework, office work)  2 - Symptomatic, <50% in bed during the day (Ambulatory and capable of all self care but unable to carry out any work activities. Up and about more than 50% of waking hours)  3 - Symptomatic, >50% in bed, but not bedbound (Capable of only limited self-care, confined to bed or chair 50% or more of waking hours)  4 - Bedbound (Completely disabled. Cannot carry on any self-care. Totally confined to bed or chair)  5 - Death   Santiago Glad MM, Creech RH, Tormey DC, et al. [phone]). "Toxicity and response criteria of the [facility]". Am. Evlyn Clines. Oncol. 5 (6): 649-55    LABORATORY DATA:  Lab Results  Component Value Date   WBC 5.6 11/02/2022   HGB 10.8 (L) 11/02/2022   HCT 32.9 (L) 11/02/2022   MCV 89.6 11/02/2022   PLT 250 11/02/2022   Lab Results  Component Value Date   NA 140 11/02/2022   K 4.2 11/02/2022   CL 108 11/02/2022   CO2 27 11/02/2022   Lab Results  Component Value Date   ALT 9 11/02/2022   AST 12 (L) 11/02/2022   ALKPHOS 85 11/02/2022   BILITOT 0.6 11/02/2022      RADIOGRAPHY: No results found.     IMPRESSION/PLAN: 1.  Stage III cT3N2M0, adenocarcinoma of the mid to upper rectum. We discussed the patient's course and change in care teams since our last visit. We reviewed the rationale for chemoradiation followed by further surgical resection. He will meet with Dr. Kaylyn Lim at [facility] in two weeks for consultation.  We discussed the risks, benefits, short, and long term effects of radiotherapy, as well as the curative intent, and the patient is going to finish his last cycle of chemotherapy on 02/07/23. We will plan simulation in the next few weeks and to  begin treatment on 03/07/23. He will sign written consent at the time of simulation. 2. Risks of pelvic floor dysfunction from radiotherapy. We discussed the importance of evaluation with physical therapy prior to pelvic radiation. Another  referral was placed to physical therapy today.     In a visit lasting 45 minutes, greater than  50% of the time was spent face to face discussing the patient's condition, in preparation for the discussion, and coordinating the patient's care.       Osker Mason, [facility]   **Disclaimer: This note was dictated with voice recognition software. Similar sounding words can inadvertently be transcribed and this note may contain transcription errors which may not have been corrected upon publication of note.**

## 2023-02-04 ENCOUNTER — Other Ambulatory Visit: Payer: Self-pay | Admitting: Radiation Oncology

## 2023-02-04 DIAGNOSIS — C20 Malignant neoplasm of rectum: Secondary | ICD-10-CM

## 2023-02-08 ENCOUNTER — Other Ambulatory Visit: Payer: Self-pay

## 2023-02-16 ENCOUNTER — Emergency Department (HOSPITAL_COMMUNITY): Payer: No Typology Code available for payment source

## 2023-02-16 ENCOUNTER — Encounter (HOSPITAL_COMMUNITY): Payer: Self-pay

## 2023-02-16 ENCOUNTER — Emergency Department (HOSPITAL_COMMUNITY)
Admission: EM | Admit: 2023-02-16 | Discharge: 2023-02-20 | Disposition: E | Payer: No Typology Code available for payment source | Attending: Emergency Medicine | Admitting: Emergency Medicine

## 2023-02-16 ENCOUNTER — Other Ambulatory Visit: Payer: Self-pay

## 2023-02-16 DIAGNOSIS — I7101 Dissection of ascending aorta: Secondary | ICD-10-CM | POA: Diagnosis not present

## 2023-02-16 DIAGNOSIS — Z20822 Contact with and (suspected) exposure to covid-19: Secondary | ICD-10-CM | POA: Insufficient documentation

## 2023-02-16 DIAGNOSIS — C20 Malignant neoplasm of rectum: Secondary | ICD-10-CM | POA: Insufficient documentation

## 2023-02-16 DIAGNOSIS — R079 Chest pain, unspecified: Secondary | ICD-10-CM | POA: Diagnosis present

## 2023-02-16 LAB — CBC WITH DIFFERENTIAL/PLATELET
Abs Immature Granulocytes: 0.05 10*3/uL (ref 0.00–0.07)
Basophils Absolute: 0 10*3/uL (ref 0.0–0.1)
Basophils Relative: 0 %
Eosinophils Absolute: 0.1 10*3/uL (ref 0.0–0.5)
Eosinophils Relative: 1 %
HCT: 32.4 % — ABNORMAL LOW (ref 39.0–52.0)
Hemoglobin: 9.9 g/dL — ABNORMAL LOW (ref 13.0–17.0)
Immature Granulocytes: 1 %
Lymphocytes Relative: 33 %
Lymphs Abs: 2 10*3/uL (ref 0.7–4.0)
MCH: 32 pg (ref 26.0–34.0)
MCHC: 30.6 g/dL (ref 30.0–36.0)
MCV: 104.9 fL — ABNORMAL HIGH (ref 80.0–100.0)
Monocytes Absolute: 0.2 10*3/uL (ref 0.1–1.0)
Monocytes Relative: 4 %
Neutro Abs: 3.8 10*3/uL (ref 1.7–7.7)
Neutrophils Relative %: 61 %
Platelets: 41 10*3/uL — ABNORMAL LOW (ref 150–400)
RBC: 3.09 MIL/uL — ABNORMAL LOW (ref 4.22–5.81)
RDW: 19.9 % — ABNORMAL HIGH (ref 11.5–15.5)
WBC: 6.1 10*3/uL (ref 4.0–10.5)
nRBC: 0 % (ref 0.0–0.2)

## 2023-02-16 LAB — COMPREHENSIVE METABOLIC PANEL
ALT: 15 U/L (ref 0–44)
AST: 23 U/L (ref 15–41)
Albumin: 3.5 g/dL (ref 3.5–5.0)
Alkaline Phosphatase: 107 U/L (ref 38–126)
Anion gap: 10 (ref 5–15)
BUN: 28 mg/dL — ABNORMAL HIGH (ref 8–23)
CO2: 16 mmol/L — ABNORMAL LOW (ref 22–32)
Calcium: 8.4 mg/dL — ABNORMAL LOW (ref 8.9–10.3)
Chloride: 113 mmol/L — ABNORMAL HIGH (ref 98–111)
Creatinine, Ser: 1.19 mg/dL (ref 0.61–1.24)
GFR, Estimated: >60 mL/min (ref >60)
Glucose, Bld: 123 mg/dL — ABNORMAL HIGH (ref 70–99)
Potassium: 3.6 mmol/L (ref 3.5–5.1)
Sodium: 139 mmol/L (ref 135–145)
Total Bilirubin: 0.8 mg/dL (ref 0.3–1.2)
Total Protein: 5.9 g/dL — ABNORMAL LOW (ref 6.5–8.1)

## 2023-02-16 LAB — RESP PANEL BY RT-PCR (RSV, FLU A&B, COVID)  RVPGX2
Influenza A by PCR: NEGATIVE
Influenza B by PCR: NEGATIVE
Resp Syncytial Virus by PCR: NEGATIVE
SARS Coronavirus 2 by RT PCR: NEGATIVE

## 2023-02-16 LAB — PROTIME-INR
INR: 1.3 — ABNORMAL HIGH (ref 0.8–1.2)
Prothrombin Time: 16.3 seconds — ABNORMAL HIGH (ref 11.4–15.2)

## 2023-02-16 LAB — APTT: aPTT: 35 seconds (ref 24–36)

## 2023-02-16 LAB — LACTIC ACID, PLASMA: Lactic Acid, Venous: 2.9 mmol/L (ref 0.5–1.9)

## 2023-02-16 LAB — TROPONIN I (HIGH SENSITIVITY): Troponin I (High Sensitivity): 26 ng/L — ABNORMAL HIGH (ref <18)

## 2023-02-16 MED ORDER — FENTANYL CITRATE PF 50 MCG/ML IJ SOSY
50.0000 ug | PREFILLED_SYRINGE | Freq: Once | INTRAMUSCULAR | Status: AC
  Administered 2023-02-16: 50 ug via INTRAVENOUS
  Filled 2023-02-16: qty 1

## 2023-02-16 MED ORDER — VASOPRESSIN 20 UNITS/100 ML INFUSION FOR SHOCK
INTRAVENOUS | Status: AC
  Filled 2023-02-16: qty 100

## 2023-02-16 MED ORDER — IOHEXOL 350 MG/ML SOLN
80.0000 mL | Freq: Once | INTRAVENOUS | Status: AC | PRN
  Administered 2023-02-16: 80 mL via INTRAVENOUS

## 2023-02-16 MED ORDER — ESMOLOL HCL-SODIUM CHLORIDE 2000 MG/100ML IV SOLN
INTRAVENOUS | Status: AC
  Filled 2023-02-16: qty 100

## 2023-02-16 MED ORDER — SODIUM CHLORIDE 0.9 % IV SOLN
2.0000 g | Freq: Once | INTRAVENOUS | Status: DC
  Filled 2023-02-16: qty 10

## 2023-02-16 MED ORDER — VASOPRESSIN 20 UNITS/100 ML INFUSION FOR SHOCK
0.0000 [IU]/min | INTRAVENOUS | Status: DC
  Administered 2023-02-16: 0.03 [IU]/min via INTRAVENOUS

## 2023-02-16 MED ORDER — METRONIDAZOLE 500 MG/100ML IV SOLN
500.0000 mg | Freq: Once | INTRAVENOUS | Status: DC
  Filled 2023-02-16: qty 100

## 2023-02-16 MED ORDER — FENTANYL CITRATE PF 50 MCG/ML IJ SOSY
PREFILLED_SYRINGE | INTRAMUSCULAR | Status: DC | PRN
Start: 2023-02-16 — End: 2023-02-17
  Administered 2023-02-16 (×2): 50 ug via INTRAVENOUS

## 2023-02-16 MED ORDER — SODIUM CHLORIDE 0.9 % IV SOLN
2.0000 g | Freq: Two times a day (BID) | INTRAVENOUS | Status: DC
  Administered 2023-02-16: 2 g via INTRAVENOUS
  Filled 2023-02-16: qty 12.5

## 2023-02-16 MED ORDER — LACTATED RINGERS IV BOLUS
1000.0000 mL | Freq: Once | INTRAVENOUS | Status: AC
  Administered 2023-02-16: 1000 mL via INTRAVENOUS

## 2023-02-16 MED ORDER — FENTANYL CITRATE PF 50 MCG/ML IJ SOSY
PREFILLED_SYRINGE | INTRAMUSCULAR | Status: AC
  Filled 2023-02-16: qty 2

## 2023-02-16 MED ORDER — SODIUM CHLORIDE 0.9 % IV SOLN: 250.0000 mL | INTRAVENOUS | Status: DC

## 2023-02-16 MED ORDER — HYDROCORTISONE SOD SUC (PF) 100 MG IJ SOLR
100.0000 mg | Freq: Once | INTRAMUSCULAR | Status: AC
  Administered 2023-02-16: 100 mg via INTRAVENOUS
  Filled 2023-02-16: qty 2

## 2023-02-16 MED ORDER — VANCOMYCIN HCL 1250 MG/250ML IV SOLN
1250.0000 mg | Freq: Once | INTRAVENOUS | Status: AC
  Administered 2023-02-16: 1250 mg via INTRAVENOUS
  Filled 2023-02-16: qty 250

## 2023-02-16 MED ORDER — VANCOMYCIN HCL 1250 MG/250ML IV SOLN: 1250.0000 mg | INTRAVENOUS | Status: DC

## 2023-02-16 MED ORDER — VANCOMYCIN HCL IN DEXTROSE 1-5 GM/200ML-% IV SOLN
1000.0000 mg | Freq: Once | INTRAVENOUS | Status: DC
  Filled 2023-02-16: qty 200

## 2023-02-16 MED ORDER — NOREPINEPHRINE 4 MG/250ML-% IV SOLN
2.0000 ug/min | INTRAVENOUS | Status: DC
  Administered 2023-02-16: 10 ug/min via INTRAVENOUS

## 2023-02-17 ENCOUNTER — Telehealth: Payer: Self-pay | Admitting: Radiation Oncology

## 2023-02-17 LAB — CBG MONITORING, ED: Glucose-Capillary: 111 mg/dL — ABNORMAL HIGH (ref 70–99)

## 2023-02-17 NOTE — Telephone Encounter (Signed)
8/29 @ 10:16 AM Patient's wife Mazzie called to let staff know that patient had expired on yesterday.  She is aware that all of his CT Sim appointment has already been canceled.

## 2023-02-20 NOTE — ED Triage Notes (Signed)
[facility] for chest pain. BP been low like 77/39 entire trip. Diaphoretic, from home

## 2023-02-20 NOTE — ED Notes (Signed)
Pt complaining of not being able to catch his breath. Oxygen increased to 6L [place] and pt sat upright in bed. Pt skin is dusky in color. Pt states still unable to breathe well and HR increasing to 120, placed on NRB with slow improvement in HR and ability to breathe.

## 2023-02-20 NOTE — Progress Notes (Signed)
Pharmacy Antibiotic Note  Scott [name] is a 72 y.o. male admitted on 02/03/2023 with sepsis.  Pharmacy has been consulted for vancomycin and aztreonam dosing.  Patient afebrile, wbc pending. Scr normal at 1.1  Plan Vancomycin 1250 mg IV Q 24 hrs. Goal AUC 400-550. Expected AUC: 542 SCr used: 1.19  Patient has tolerated cephalosporins in the past (cefazolin, ceftriaxone) will change aztreonam to cefepime 2g q12 hours  Height: 5\' 8"  (172.7 cm) Weight: 66.5 kg (146 lb 8 oz) IBW/kg (Calculated) : 68.4  Temp (24hrs), Avg:98.8 F (37.1 C), Min:98.8 F (37.1 C), Max:98.8 F (37.1 C)  No results for input(s): "WBC", "CREATININE", "LATICACIDVEN", "VANCOTROUGH", "VANCOPEAK", "VANCORANDOM", "GENTTROUGH", "GENTPEAK", "GENTRANDOM", "TOBRATROUGH", "TOBRAPEAK", "TOBRARND", "AMIKACINPEAK", "AMIKACINTROU", "AMIKACIN" in the last 168 hours.  CrCl cannot be calculated (Patient's most recent lab result is older than the maximum 21 days allowed.).    Allergies  Allergen Reactions   Amlodipine Itching   Carvedilol Other (See Comments)    Night terrors   Hydralazine Other (See Comments)    Night terrors, fatigued   Valsartan Other (See Comments)    Night terrors and fatigued   Cefotetan Hives    Possible hives, but not clear   Thank you for allowing pharmacy to be a part of this patient's care.  Sheppard Coil PharmD., BCPS Clinical Pharmacist 01/22/2023 3:23 PM

## 2023-02-20 NOTE — ED Notes (Signed)
Patient Placement verified documentation.

## 2023-02-20 NOTE — ED Notes (Signed)
Dr. Rubin Payor to bedside to reassess patient.

## 2023-02-20 NOTE — ED Provider Notes (Signed)
  Physical Exam  BP (!) 113/94   Pulse (!) 106   Temp 98.8 F (37.1 C) (Rectal)   Resp (!) 34   Ht 5\' 8"  (1.727 m)   Wt 66.5 kg   SpO2 91%   BMI 22.28 kg/m   Physical Exam  Procedures  Procedures  ED Course / MDM    Medical Decision Making Amount and/or Complexity of Data Reviewed Labs: ordered. Radiology: ordered.  Risk Prescription drug management.   Received patient in signout.  Critically ill.  Hypotensive on pressors.  Aortic dissection on CTA.  Became more dyspneic.  Discussed with Dr. Lavinia Sharps from cardiothoracic surgery.  Not surgical candidate due to illness at this time and baseline illness.  After discussion with the family and patient patient was made comfort care.  Given fentanyl to help with dyspnea.  Expired peacefully with family at bedside at 422 pm.  CRITICAL CARE Performed by: Benjiman Core Total critical care time: 30 minutes Critical care time was exclusive of separately billable procedures and treating other patients. Critical care was necessary to treat or prevent imminent or life-threatening deterioration. Critical care was time spent personally by me on the following activities: development of treatment plan with patient and/or surrogate as well as nursing, discussions with consultants, evaluation of patient's response to treatment, examination of patient, obtaining history from patient or surrogate, ordering and performing treatments and interventions, ordering and review of laboratory studies, ordering and review of radiographic studies, pulse oximetry and re-evaluation of patient's condition.        Benjiman Core, MD 02/11/2023 1640

## 2023-02-20 NOTE — ED Notes (Signed)
Family remains at bedside with patient.

## 2023-02-20 NOTE — ED Notes (Signed)
Chaplain to bedside 

## 2023-02-20 NOTE — ED Provider Notes (Signed)
[place] EMERGENCY DEPARTMENT AT [facility] Provider Note   CSN: [id number] Arrival date & time: 02/02/2023  1353     History  Chief Complaint  Patient presents with   Chest Pain    Scott [name] is a 72 y.o. male.  HPI 72 year old male presents with chest pain. Started about 1 hour ago.  Feels like a sharp pain in the middle of his chest.  Maybe a little bit of back pain.  EMS reported that his initial blood pressure was in the 60s.  He was in A-fib which the patient states is chronic.  He thinks he is on a blood thinner but is not sure.  He has been dealing with a chest cold for about a week so he thinks it is a little better. Currently his BP is in the 70s/80s.  Wife notes that the patient was doing okay sitting on the couch and when he stood up he fell to the ground.  He then started complaining of this chest pain.  He last had chemotherapy about 9 days ago for rectal cancer.  He has been dealing with a cold but that has been improving.  Home Medications Prior to Admission medications   Medication Sig Start Date End Date Taking? Authorizing Provider  empagliflozin (JARDIANCE) 25 MG TABS tablet Take 0.5 tablets by mouth every morning. 07/28/22   [provider]  ferrous sulfate 325 (65 FE) MG EC tablet Take 1 tablet (325 mg total) by mouth daily with breakfast. 10/20/22   Briant Cedar, PA-C  furosemide (LASIX) 40 MG tablet Take 40 mg by mouth daily as needed for edema or fluid. 07/28/22   [provider]  hydrOXYzine (ATARAX) 25 MG tablet Take 1 tablet (25 mg total) by mouth 2 (two) times daily as needed for anxiety. 09/18/22   Standley Brooking, MD  labetalol (NORMODYNE) 100 MG tablet Take 1 tablet (100 mg total) by mouth 3 (three) times daily with meals. 08/26/21   Fenton, Clint R, PA  lidocaine-prilocaine (EMLA) cream Apply 1 Application topically as needed. 09/29/22   Jaci Standard, MD  mirtazapine (REMERON) 7.5 MG tablet TAKE 1 TABLET BY MOUTH DAILY AT  BEDTIME 11/15/22   Jaci Standard, MD  NON FORMULARY Take 1 tablet by mouth daily as needed (Constipation). Laxative. Unknown name.    [provider]  ondansetron (ZOFRAN) 8 MG tablet Take 1 tablet (8 mg total) by mouth every 8 (eight) hours as needed. 09/29/22   Jaci Standard, MD  oxyCODONE (OXY IR/ROXICODONE) 5 MG immediate release tablet Take 1-2 tablets (5-10 mg total) by mouth every 4 (four) hours as needed for severe pain. 10/11/22   Jaci Standard, MD  polyethylene glycol (MIRALAX / GLYCOLAX) 17 g packet Take 17 g by mouth daily as needed for mild constipation or moderate constipation.    [provider]  prochlorperazine (COMPAZINE) 10 MG tablet Take 1 tablet (10 mg total) by mouth every 6 (six) hours as needed for nausea or vomiting. 09/29/22   Jaci Standard, MD  sacubitril-valsartan (ENTRESTO) 24-26 MG Take 0.5 tablets by mouth daily. 07/28/22   [provider]  spironolactone (ALDACTONE) 25 MG tablet Take 0.5 tablets by mouth daily. 07/28/22   [provider]      Allergies    Amlodipine, Carvedilol, Hydralazine, Valsartan, and Cefotetan    Review of Systems   Review of Systems  Constitutional:  Negative for fever.  HENT:  Positive for congestion.   Respiratory:  Positive for cough.   Cardiovascular:  Positive for chest pain.  Gastrointestinal:  Negative for abdominal pain.    Physical Exam Updated Vital Signs Ht 5\' 8"  (1.727 m)   Wt 66.5 kg   BMI 22.28 kg/m  Physical Exam Vitals and nursing note reviewed.  Constitutional:      Appearance: He is well-developed. He is ill-appearing.  HENT:     Head: Normocephalic and atraumatic.  Cardiovascular:     Rate and Rhythm: Tachycardia present. Rhythm irregular.     Pulses:          Radial pulses are 2+ on the right side.       Posterior tibial pulses are 2+ on the right side and 2+ on the left side.     Heart sounds: Normal heart sounds.  Pulmonary:     Effort: Pulmonary effort is  normal.     Breath sounds: Normal breath sounds.  Abdominal:     Palpations: Abdomen is soft.     Tenderness: There is no abdominal tenderness.  Skin:    General: Skin is warm and dry.  Neurological:     Mental Status: He is alert.     Comments: Patient is moving all 4 extremities but seems restless.     ED Results / Procedures / Treatments   Labs (all labs ordered are listed, but only abnormal results are displayed) Labs Reviewed  CBC WITH DIFFERENTIAL/PLATELET - Abnormal; Notable for the following components:      Result Value   RBC 3.09 (*)    Hemoglobin 9.9 (*)    HCT 32.4 (*)    MCV 104.9 (*)    RDW 19.9 (*)    Platelets 41 (*)    All other components within normal limits  LACTIC ACID, PLASMA - Abnormal; Notable for the following components:   Lactic Acid, Venous 2.9 (*)    All other components within normal limits  COMPREHENSIVE METABOLIC PANEL - Abnormal; Notable for the following components:   Chloride 113 (*)    CO2 16 (*)    Glucose, Bld 123 (*)    BUN 28 (*)    Calcium 8.4 (*)    Total Protein 5.9 (*)    All other components within normal limits  PROTIME-INR - Abnormal; Notable for the following components:   Prothrombin Time 16.3 (*)    INR 1.3 (*)    All other components within normal limits  TROPONIN I (HIGH SENSITIVITY) - Abnormal; Notable for the following components:   Troponin I (High Sensitivity) 26 (*)    All other components within normal limits  CULTURE, BLOOD (ROUTINE X 2)  CULTURE, BLOOD (ROUTINE X 2)  RESP PANEL BY RT-PCR (RSV, FLU A&B, COVID)  RVPGX2  APTT  URINALYSIS, W/ REFLEX TO CULTURE (INFECTION SUSPECTED)  LACTIC ACID, PLASMA  I-STAT CHEM 8, ED    EKG EKG Interpretation Date/Time:  Wednesday February 16 2023 14:58:08 EDT Ventricular Rate:  98 PR Interval:    QRS Duration:  99 QT Interval:  346 QTC Calculation: 442 R Axis:   92  Text Interpretation: Atrial fibrillation Ventricular premature complex Right axis deviation  Anteroseptal infarct, old Repol abnrm, severe global ischemia (LM/MVD) similar to earlier in the day Confirmed by Pricilla Loveless [phone]) on 01/24/2023 3:03:58 PM  Radiology No results found.  Procedures .Critical Care  Performed by: Pricilla Loveless, MD Authorized by: Pricilla Loveless, MD   Critical care provider statement:    Critical  care time (minutes):  60   Critical care time was exclusive of:  Separately billable procedures and treating other patients   Critical care was necessary to treat or prevent imminent or life-threatening deterioration of the following conditions:  Circulatory failure and cardiac failure   Critical care was time spent personally by me on the following activities:  Development of treatment plan with patient or surrogate, discussions with consultants, evaluation of patient's response to treatment, examination of patient, ordering and review of laboratory studies, ordering and review of radiographic studies, ordering and performing treatments and interventions, pulse oximetry, re-evaluation of patient's condition and review of old charts     Medications Ordered in ED Medications  lactated ringers bolus 1,000 mL (has no administration in time range)  aztreonam (AZACTAM) 2 g in sodium chloride 0.9 % 100 mL IVPB (has no administration in time range)  metroNIDAZOLE (FLAGYL) IVPB 500 mg (has no administration in time range)  vancomycin (VANCOCIN) IVPB 1000 mg/200 mL premix (has no administration in time range)  lactated ringers bolus 1,000 mL (has no administration in time range)  0.9 %  sodium chloride infusion (has no administration in time range)  norepinephrine (LEVOPHED) 4mg  in (0.016 mg/mL) premix infusion (has no administration in time range)  fentaNYL (SUBLIMAZE) injection 50 mcg (50 mcg Intravenous Given 01/22/2023 1425)    ED Course/ Medical Decision Making/ A&P                                 Medical Decision Making Amount and/or Complexity of Data  Reviewed Labs: ordered.    Details: Elevated lactate, mild anemia. Radiology: ordered and independent interpretation performed.    Details: Aortic dissection ECG/medicine tests: ordered and independent interpretation performed.    Details: A-fib with chronic changes but no new change.  Risk Prescription drug management.   Patient presents ill-appearing and hypotensive.  Creatinine was bypassed and we took him to CT urgently on pressors and fluids.  Unfortunately after discussion with radiology and looking at the images, he has a significant aortic dissection that is ascending.  For now his blood pressure is marginal in the 80s while on significant pressors.  I have relayed to patient and family how critical this is and we will consult CT surgery but this is a very critical condition.  He has been given some IV fentanyl for pain.  Care transferred to Dr. Rubin Payor.        Final Clinical Impression(s) / ED Diagnoses Final diagnoses:  None    Rx / DC Orders ED Discharge Orders     None         Pricilla Loveless, MD 02/11/2023 1545

## 2023-02-20 NOTE — ED Notes (Signed)
TOD called at 1622 by Dr. Rubin Payor.

## 2023-02-20 DEATH — deceased

## 2023-02-21 LAB — CULTURE, BLOOD (ROUTINE X 2)
Culture: NO GROWTH
Culture: NO GROWTH
Special Requests: ADEQUATE

## 2023-02-25 ENCOUNTER — Ambulatory Visit: Payer: Medicare Other | Admitting: Radiation Oncology

## 2023-12-29 IMAGING — CR DG CHEST 2V
2 series · 2 of 2 positions shown · non-contrast
Comparison: Chest radiograph 08/11/2021.

CLINICAL DATA: Shortness of breath for 1 week.

EXAM:
CHEST - 2 VIEW

[chest pa]
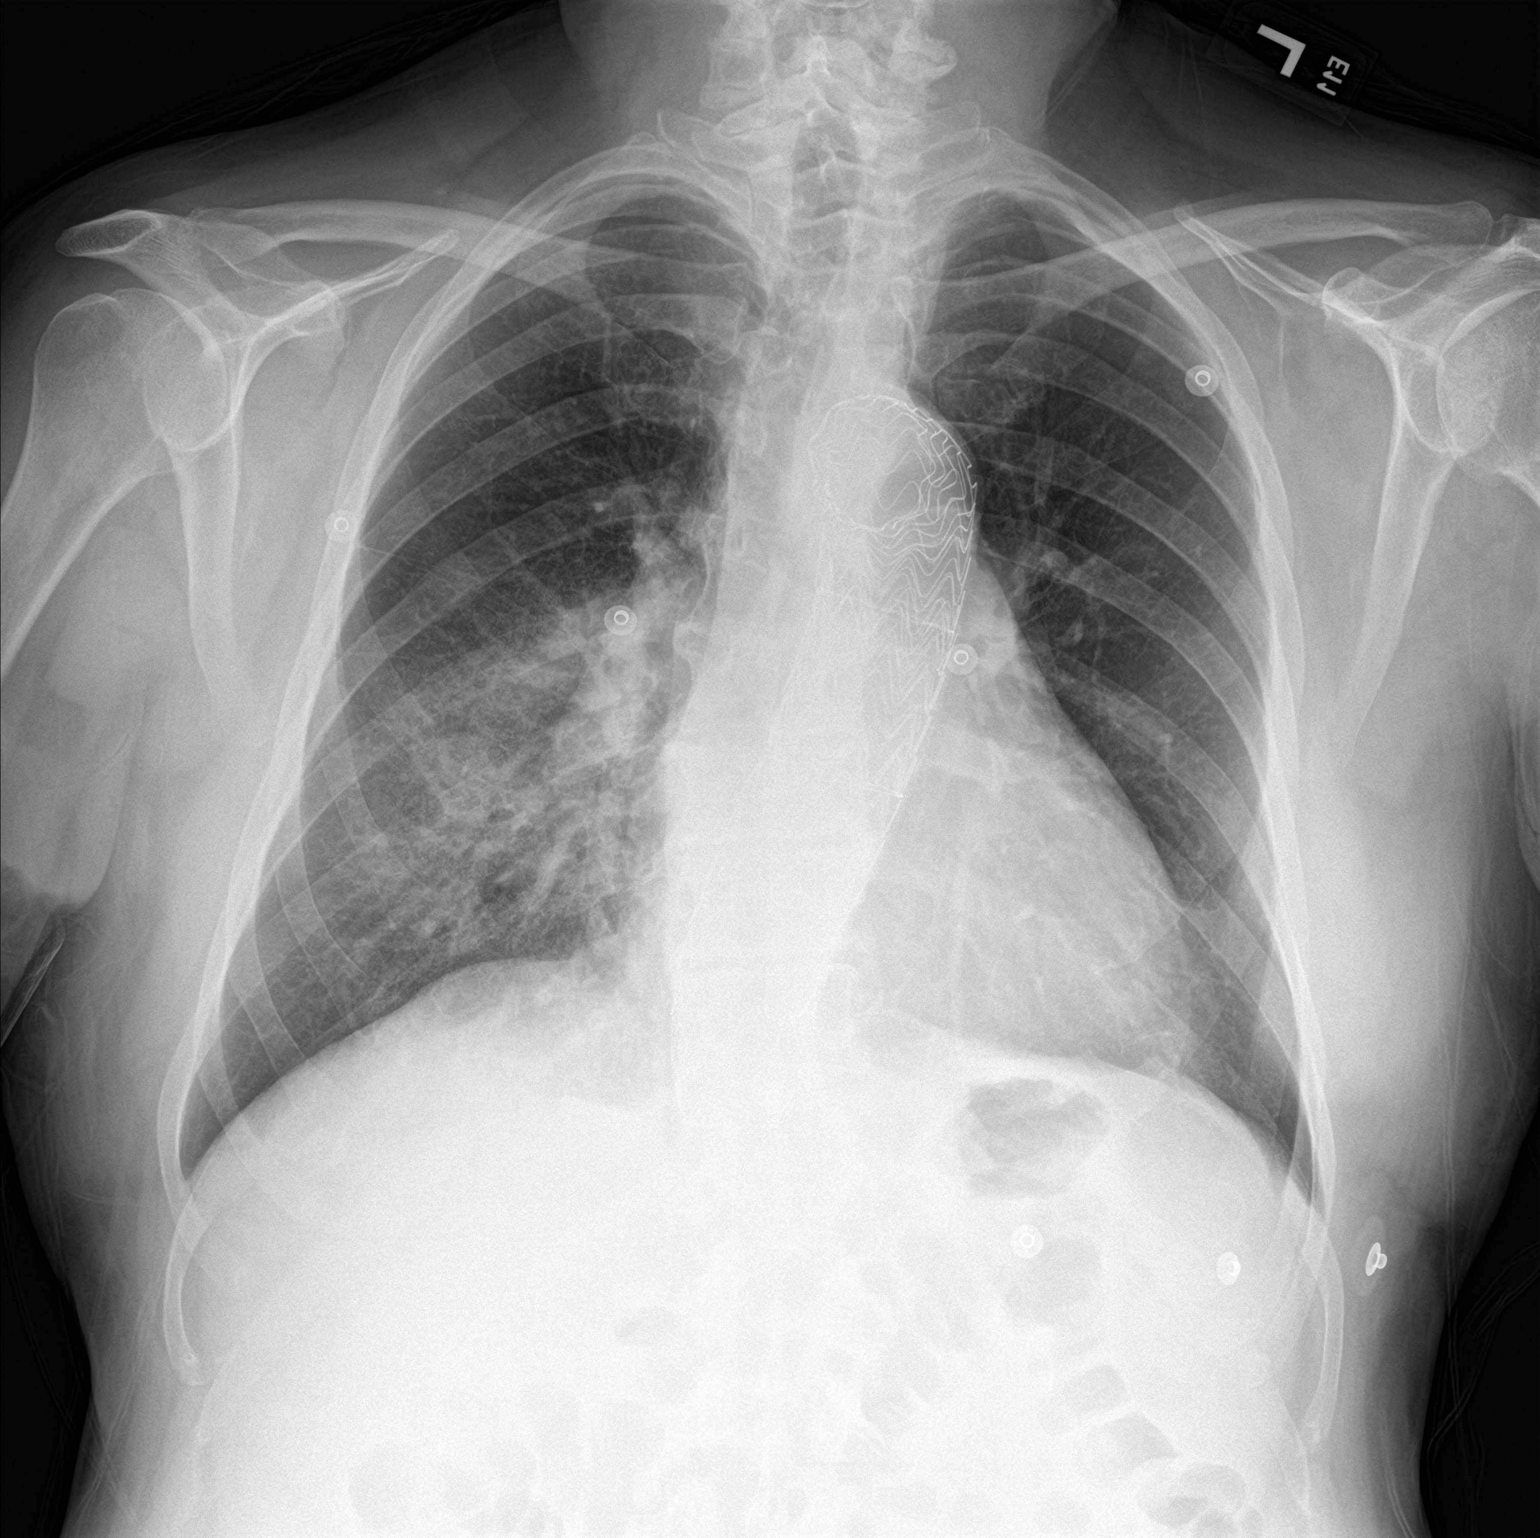

[chest lat]
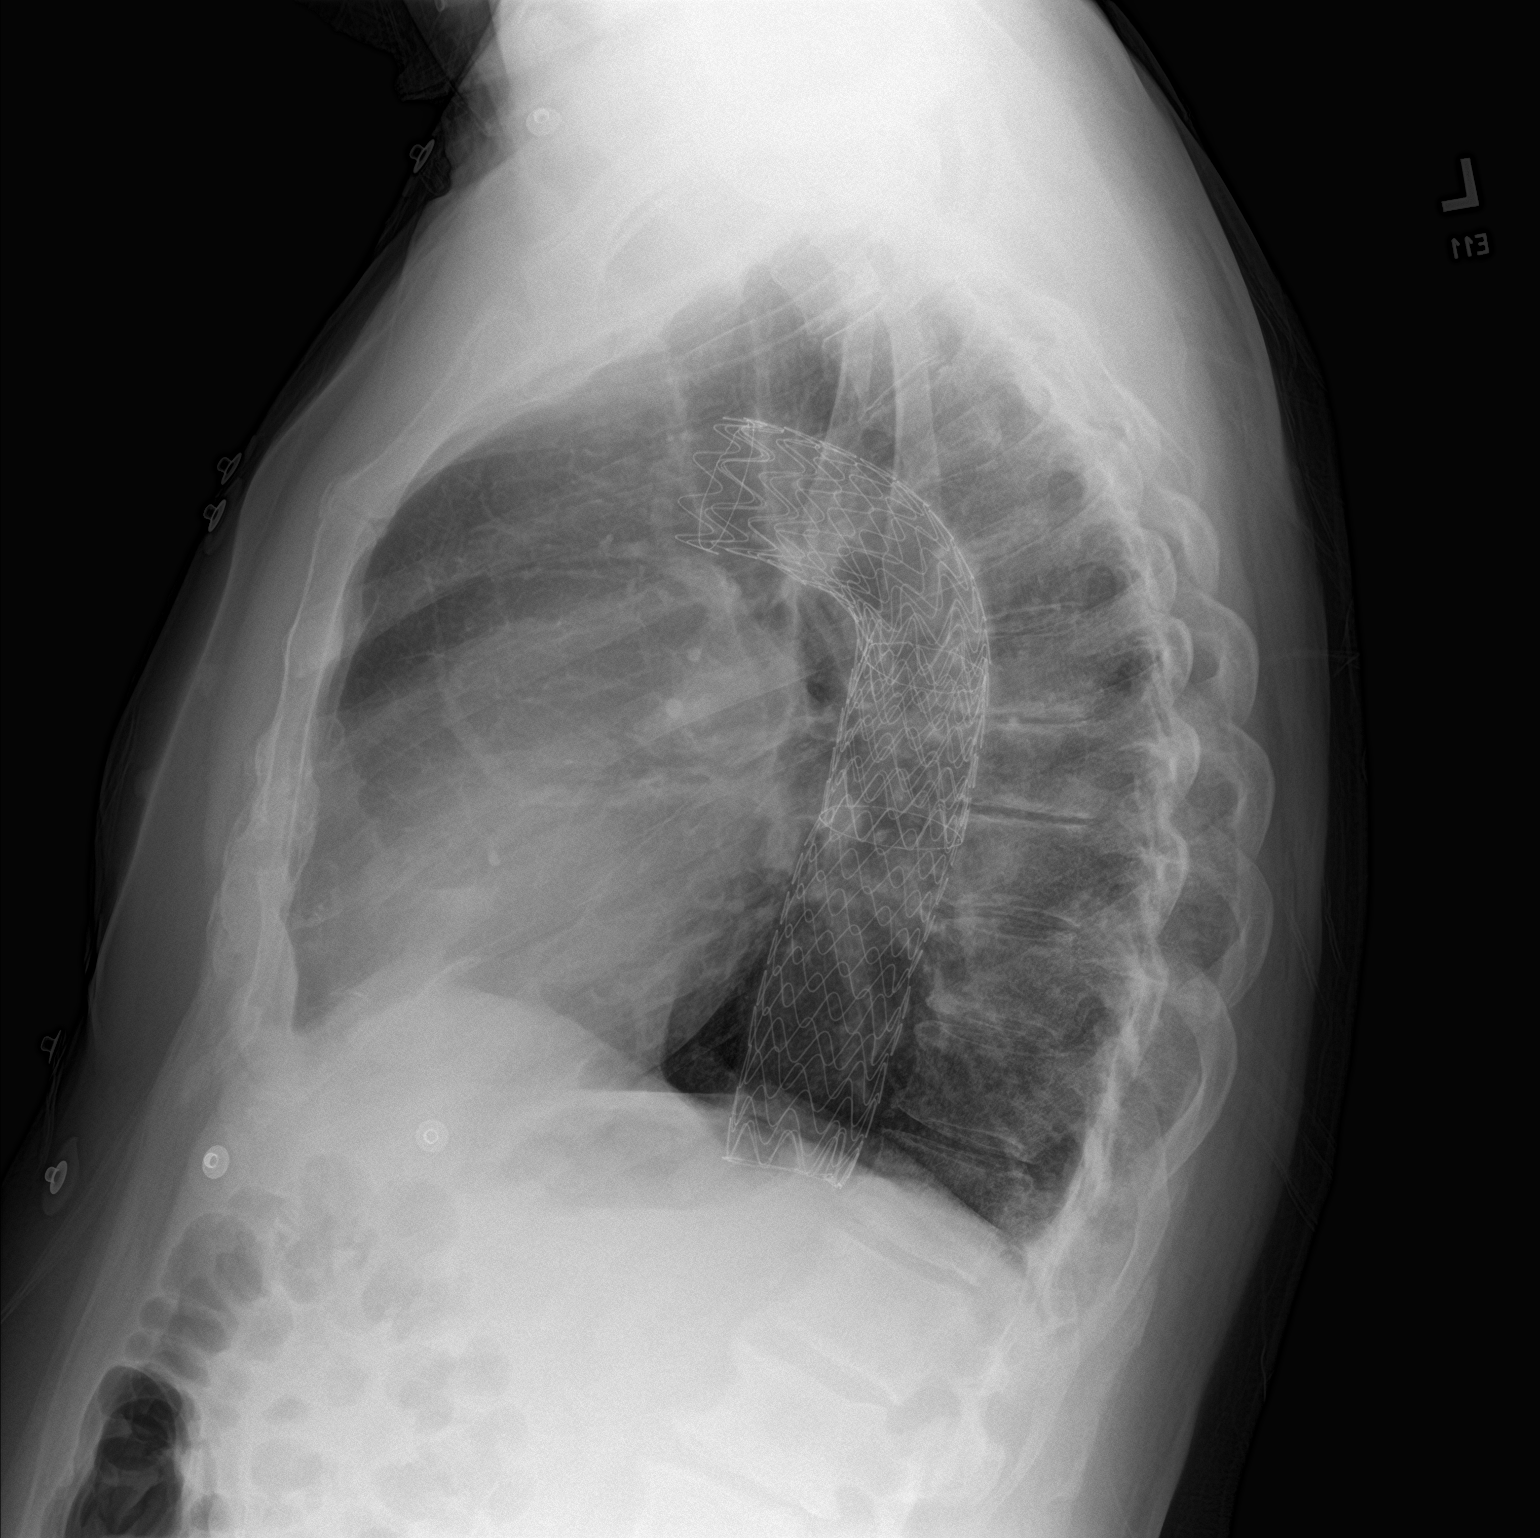

[2 of 2 positions shown; findings below may reference images not displayed]

FINDINGS: Stable cardiac and mediastinal contours with stent material in the
thoracic aorta. Increased consolidation within the right lower lung.
No pleural effusion or pneumothorax. Thoracic spine degenerative
changes.
IMPRESSION: Increasing right lower lung consolidation which may represent
pneumonia in the appropriate clinical setting. Followup PA and
lateral chest X-ray is recommended in 3-4 weeks following trial of
antibiotic therapy to ensure resolution and exclude underlying
malignancy.
